# Patient Record
Sex: Female | Born: 1950 | Race: White | Hispanic: No | Marital: Married | Smoking: Never smoker
Health system: Southern US, Community
[De-identification: ages and names within clinical notes are randomized; demographics above are authoritative.]

## PROBLEM LIST (undated history)

## (undated) DIAGNOSIS — H269 Unspecified cataract: Secondary | ICD-10-CM

## (undated) DIAGNOSIS — K219 Gastro-esophageal reflux disease without esophagitis: Secondary | ICD-10-CM

## (undated) DIAGNOSIS — T7840XA Allergy, unspecified, initial encounter: Secondary | ICD-10-CM

## (undated) DIAGNOSIS — J45909 Unspecified asthma, uncomplicated: Secondary | ICD-10-CM

## (undated) DIAGNOSIS — B029 Zoster without complications: Secondary | ICD-10-CM

## (undated) DIAGNOSIS — E039 Hypothyroidism, unspecified: Secondary | ICD-10-CM

## (undated) DIAGNOSIS — IMO0002 Reserved for concepts with insufficient information to code with codable children: Secondary | ICD-10-CM

## (undated) DIAGNOSIS — M1990 Unspecified osteoarthritis, unspecified site: Secondary | ICD-10-CM

## (undated) DIAGNOSIS — F411 Generalized anxiety disorder: Secondary | ICD-10-CM

## (undated) DIAGNOSIS — F519 Sleep disorder not due to a substance or known physiological condition, unspecified: Secondary | ICD-10-CM

## (undated) DIAGNOSIS — J309 Allergic rhinitis, unspecified: Secondary | ICD-10-CM

## (undated) DIAGNOSIS — B009 Herpesviral infection, unspecified: Secondary | ICD-10-CM

## (undated) DIAGNOSIS — M8580 Other specified disorders of bone density and structure, unspecified site: Secondary | ICD-10-CM

## (undated) DIAGNOSIS — E079 Disorder of thyroid, unspecified: Secondary | ICD-10-CM

## (undated) HISTORY — DX: Generalized anxiety disorder: F41.1

## (undated) HISTORY — DX: Allergy, unspecified, initial encounter: T78.40XA

## (undated) HISTORY — DX: Zoster without complications: B02.9

## (undated) HISTORY — DX: Reserved for concepts with insufficient information to code with codable children: IMO0002

## (undated) HISTORY — DX: Gastro-esophageal reflux disease without esophagitis: K21.9

## (undated) HISTORY — DX: Herpesviral infection, unspecified: B00.9

## (undated) HISTORY — DX: Unspecified cataract: H26.9

## (undated) HISTORY — PX: APPENDECTOMY: SHX54

## (undated) HISTORY — DX: Sleep disorder not due to a substance or known physiological condition, unspecified: F51.9

## (undated) HISTORY — PX: FRACTURE SURGERY: SHX138

## (undated) HISTORY — PX: COSMETIC SURGERY: SHX468

## (undated) HISTORY — DX: Allergic rhinitis, unspecified: J30.9

## (undated) HISTORY — DX: Disorder of thyroid, unspecified: E07.9

## (undated) HISTORY — PX: EYE SURGERY: SHX253

## (undated) HISTORY — DX: Unspecified asthma, uncomplicated: J45.909

## (undated) HISTORY — PX: FACELIFT: SHX1566

## (undated) HISTORY — PX: JOINT REPLACEMENT: SHX530

## (undated) HISTORY — DX: Other specified disorders of bone density and structure, unspecified site: M85.80

## (undated) HISTORY — PX: OTHER SURGICAL HISTORY: SHX169

## (undated) HISTORY — PX: BREAST SURGERY: SHX581

---

## 1972-01-07 HISTORY — PX: TONSILLECTOMY: SUR1361

## 1989-01-06 HISTORY — PX: VAGINAL HYSTERECTOMY: SUR661

## 1998-04-12 ENCOUNTER — Other Ambulatory Visit: Admission: RE | Admit: 1998-04-12 | Discharge: 1998-04-12 | Payer: Self-pay | Admitting: Gynecology

## 1998-06-21 ENCOUNTER — Ambulatory Visit (HOSPITAL_COMMUNITY): Admission: RE | Admit: 1998-06-21 | Discharge: 1998-06-21 | Payer: Self-pay | Admitting: Internal Medicine

## 1998-06-21 ENCOUNTER — Encounter: Payer: Self-pay | Admitting: Internal Medicine

## 1999-06-14 ENCOUNTER — Other Ambulatory Visit: Admission: RE | Admit: 1999-06-14 | Discharge: 1999-06-14 | Payer: Self-pay | Admitting: Gynecology

## 1999-09-04 ENCOUNTER — Ambulatory Visit (HOSPITAL_COMMUNITY): Admission: RE | Admit: 1999-09-04 | Discharge: 1999-09-04 | Payer: Self-pay | Admitting: Gastroenterology

## 1999-10-21 ENCOUNTER — Other Ambulatory Visit: Admission: RE | Admit: 1999-10-21 | Discharge: 1999-10-21 | Payer: Self-pay | Admitting: Gynecology

## 2001-11-03 ENCOUNTER — Other Ambulatory Visit: Admission: RE | Admit: 2001-11-03 | Discharge: 2001-11-03 | Payer: Self-pay | Admitting: Gynecology

## 2002-10-26 ENCOUNTER — Other Ambulatory Visit: Admission: RE | Admit: 2002-10-26 | Discharge: 2002-10-26 | Payer: Self-pay | Admitting: Gynecology

## 2003-11-02 ENCOUNTER — Other Ambulatory Visit: Admission: RE | Admit: 2003-11-02 | Discharge: 2003-11-02 | Payer: Self-pay | Admitting: Gynecology

## 2003-12-27 ENCOUNTER — Ambulatory Visit: Payer: Self-pay | Admitting: Internal Medicine

## 2004-06-25 ENCOUNTER — Ambulatory Visit: Payer: Self-pay | Admitting: Internal Medicine

## 2004-07-03 ENCOUNTER — Ambulatory Visit: Payer: Self-pay | Admitting: Internal Medicine

## 2004-11-08 ENCOUNTER — Other Ambulatory Visit: Admission: RE | Admit: 2004-11-08 | Discharge: 2004-11-08 | Payer: Self-pay | Admitting: Gynecology

## 2005-01-30 ENCOUNTER — Emergency Department (HOSPITAL_COMMUNITY): Admission: EM | Admit: 2005-01-30 | Discharge: 2005-01-30 | Payer: Self-pay | Admitting: Emergency Medicine

## 2005-02-05 ENCOUNTER — Ambulatory Visit: Payer: Self-pay | Admitting: Internal Medicine

## 2005-02-06 ENCOUNTER — Ambulatory Visit: Payer: Self-pay | Admitting: Cardiology

## 2005-02-06 IMAGING — CT CT HEAD W/O CM
3 of 6 series · 15 of 47 positions shown, 18 images · IV contrast (omnipaque)
Comparison: Report of [DATE].

CLINICAL DATA: Three episodes of syncope.  Shortness of breath and chest pain in the last week.
HEAD CT WITHOUT CONTRAST:
TECHNIQUE: Contiguous axial images were obtained from the base of the skull through the vertex, according to standard protocol, without contrast.
TECHNIQUE: Multidetector CT imaging of the chest was performed during bolus injection of intravenous contrast.  Multiplanar CT angiographic image reconstructions were generated to evaluate the vascular anatomy.
Contrast:  80 cc Omnipaque 300.

[Series 8: pe 1.0 b20f st · axial · 0.60mm/px · z∈[+1058,+1286]mm · 9 of 262 slices shown, 12 images]
[im 17/262  brain]
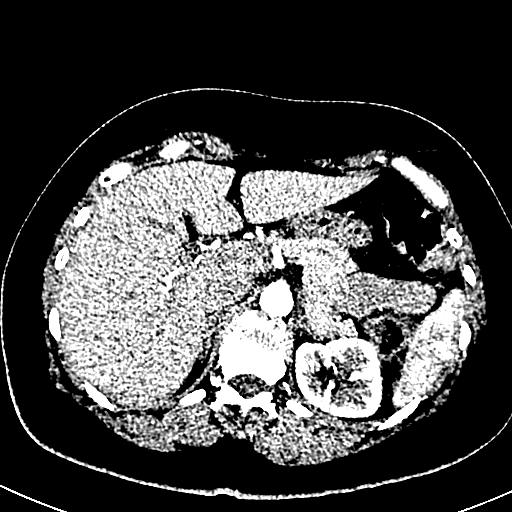
[im 17/262  bone]
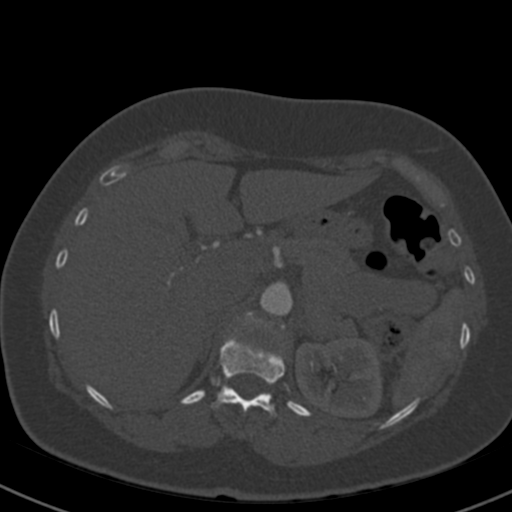
[im 49/262  brain]
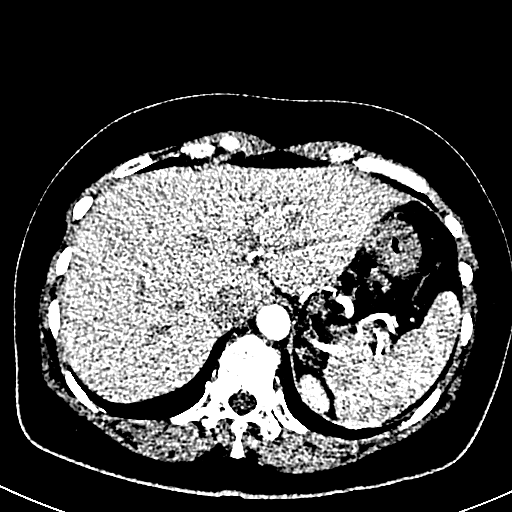
[im 82/262  brain]
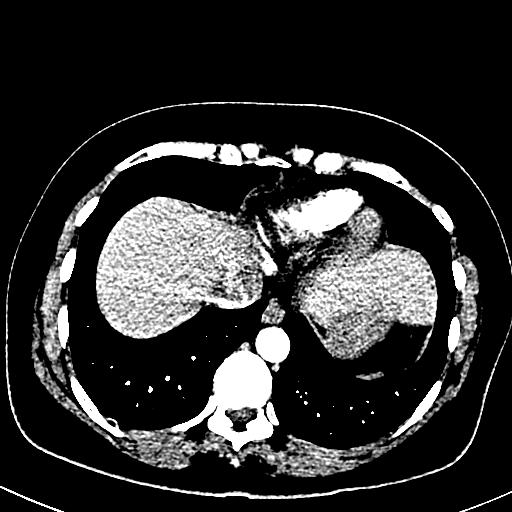
[im 98/262  brain]
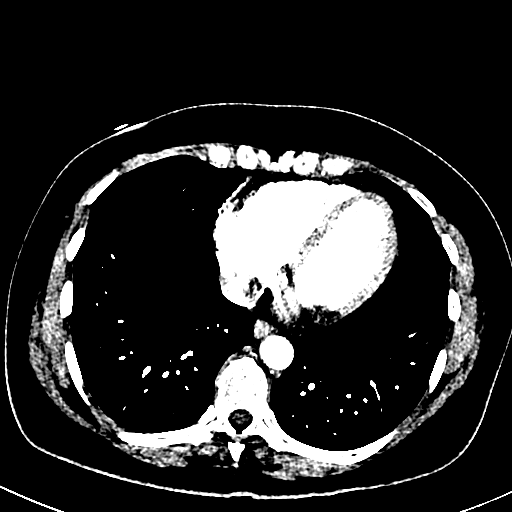
[im 131/262  brain]
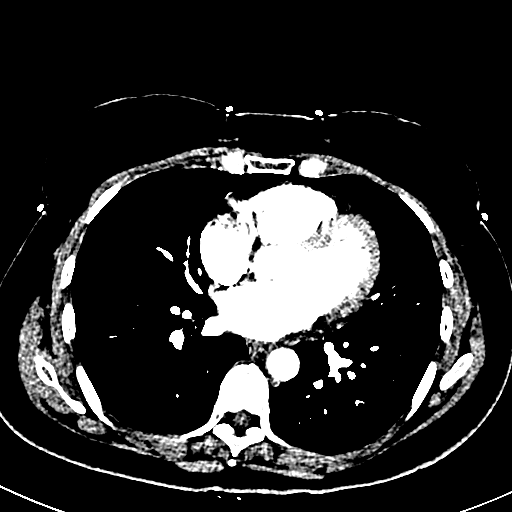
[im 131/262  bone]
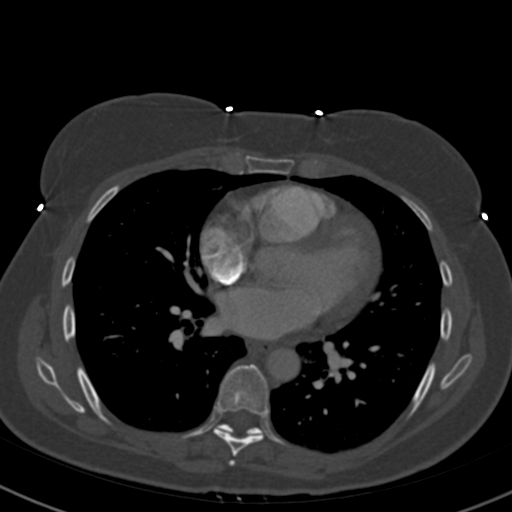
[im 164/262  brain]
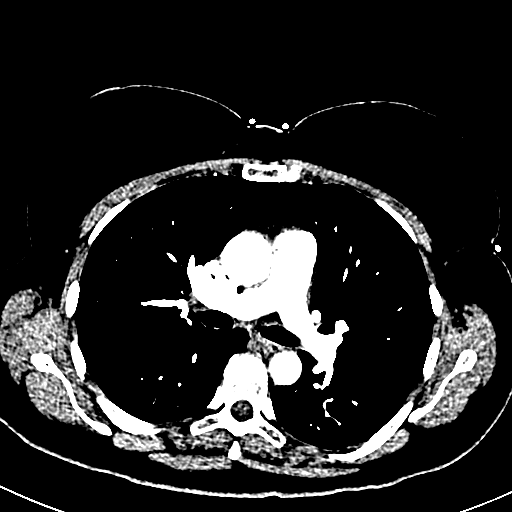
[im 180/262  brain]
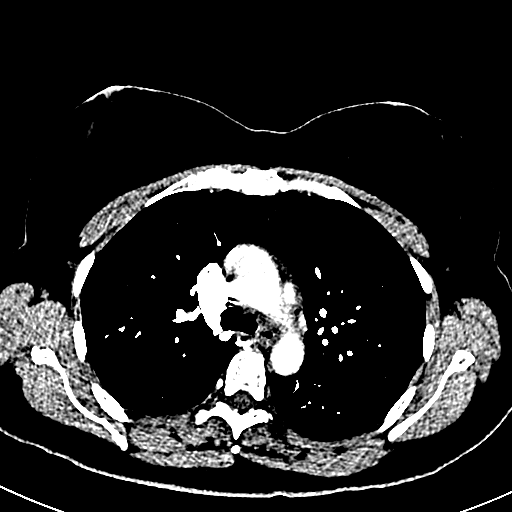
[im 213/262  brain]
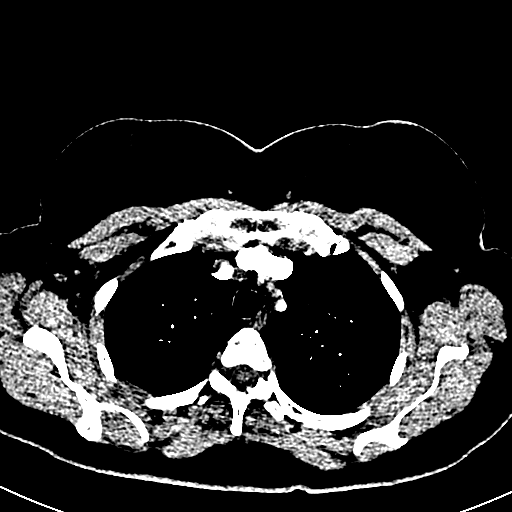
[im 245/262  brain]
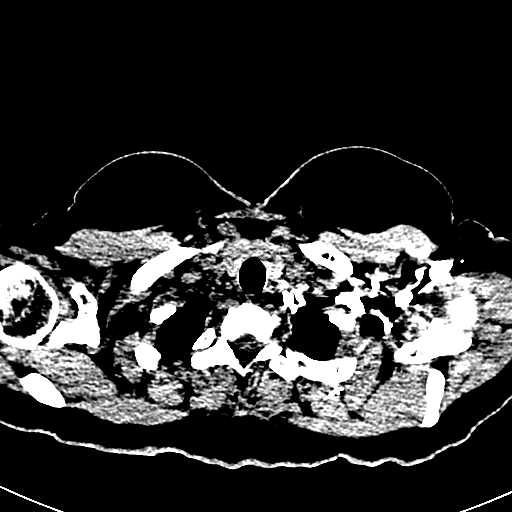
[im 245/262  bone]
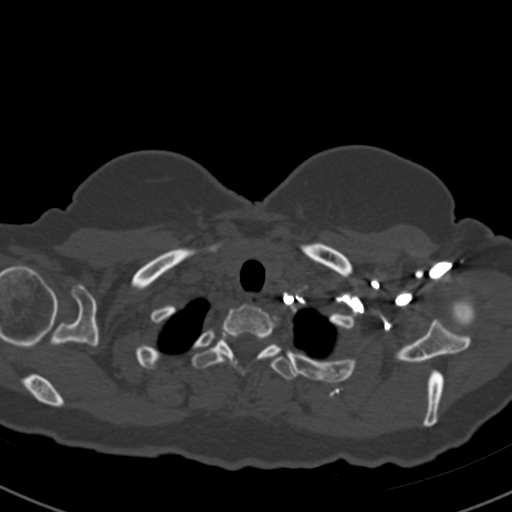

[Series 603: <mpr thick range> · coronal · 0.60mm/px · 3 of 39 slices shown]
[im 13/39  brain]
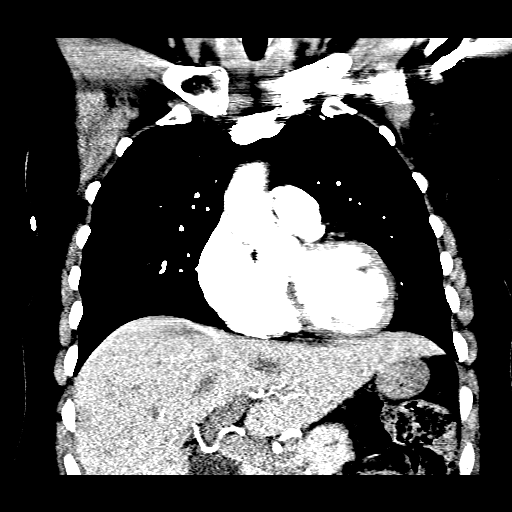
[im 17/39  brain]
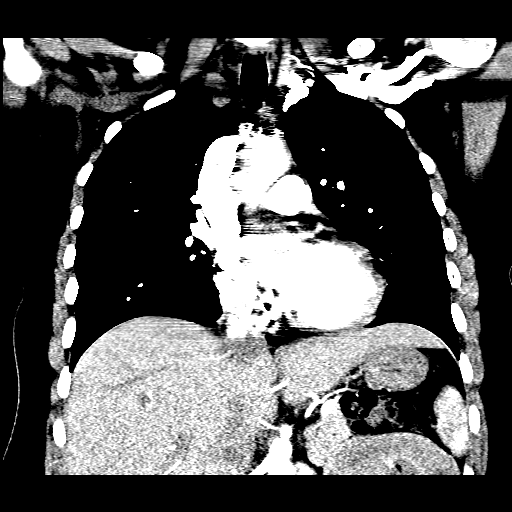
[im 22/39  brain]
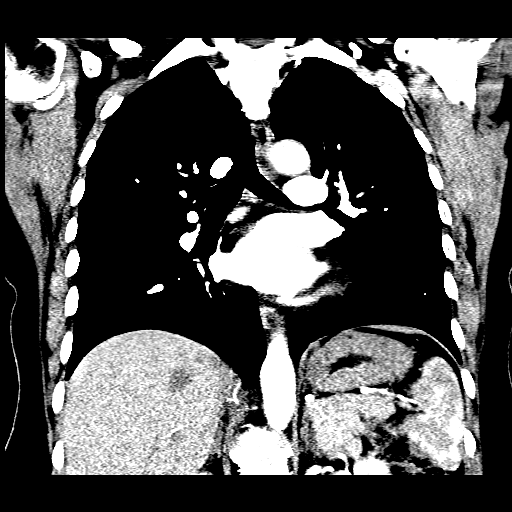

[Series 604: <mpr thick range(1)> · sagittal · 0.60mm/px · 3 of 39 slices shown]
[im 13/39  brain]
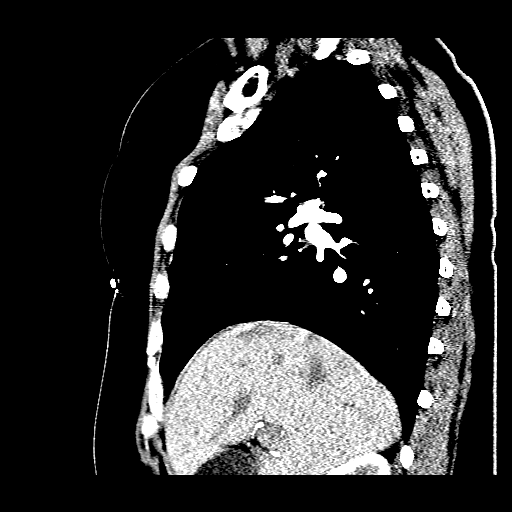
[im 20/39  brain]
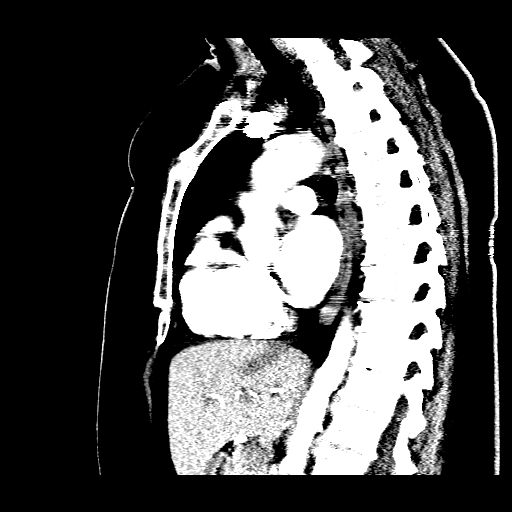
[im 26/39  brain]
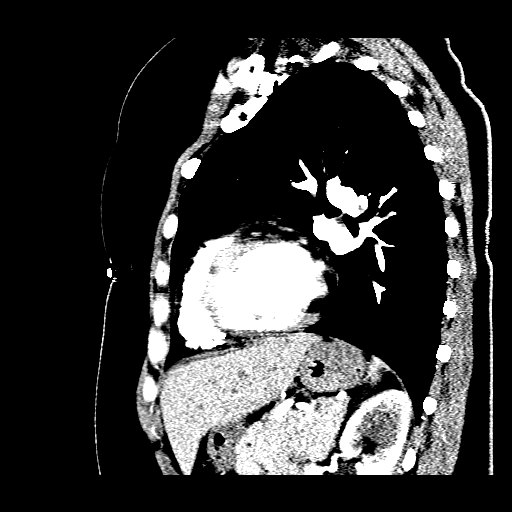

[15 of 47 positions shown; findings below may reference images not displayed]

FINDINGS: Soft tissue windows demonstrate no significant soft tissue swelling.  The paranasal sinuses and orbits are normal.  
Intracranial imaging demonstrates no acute intracranial abnormality.  No mass, hemorrhage, hydrocephalus, intra-axial, or extra-axial fluid collection.  Tiny low density focus in the left external capsule region on image #14 could represent remote ischemia or more likely, a dilated perivascular space.  There are physiologic basal ganglia calcifications.
IMPRESSION: No acute intracranial abnormality.  
CT ANGIOGRAPHY OF CHEST:
FINDINGS: Lung windows demonstrate an 8 mm focus of ground-glass attenuation on image #45 in the anterior aspect of the right lower lobe.  Left lung is clear.  
Soft tissue windows:  The quality of the study is good.  There is no filling defect within the pulmonary arterial tree to suggest pulmonary embolism.  Thoracic aorta is normal caliber.  Heart mildly enlarged.  No pericardial or pleural effusion.  Top normal right hilar nodal tissue size, likely reactive.  Limited imaging of upper abdomen demonstrates only accessory left hepatic artery arising from the left gastric.  
Bone windows demonstrate no focal osseous lesion.
IMPRESSION: 1.  No evidence of pulmonary embolism.  
2.  8 mm ground-glass attenuation density in the anterior aspect of the right lower lobe (image #45).  Although this could be related to prior infection or inflammation, bronchoalveolar cell carcinoma could have this appearance.  This could be followed with chest CT in 3 months to confirm stability or resolution.  Alternatively, a PET could be performed.  Of note, this area is at the low end of resolution for PET and some bronchoalveolar cell carcinomas can be PET negative.  Therefore, PET would be insensitive but specific.

## 2005-03-11 ENCOUNTER — Ambulatory Visit: Payer: Self-pay | Admitting: Cardiology

## 2005-03-25 ENCOUNTER — Ambulatory Visit: Payer: Self-pay

## 2005-03-25 ENCOUNTER — Encounter: Payer: Self-pay | Admitting: Internal Medicine

## 2005-05-07 ENCOUNTER — Ambulatory Visit: Payer: Self-pay | Admitting: Cardiology

## 2005-05-07 IMAGING — CT CT CHEST W/O CM
3 of 5 series · 16 of 36 positions shown, 19 images · non-contrast
Comparison: [DATE].
CHEST CT WITHOUT CONTRAST:

CLINICAL DATA: Follow-up nodules.
TECHNIQUE: Multidetector CT imaging of the chest was performed following the standard protocol without IV contrast.

[Series 2: chest_hi_res 5.0 b40f st · axial · 0.62mm/px · z∈[-274,-74]mm · 9 of 51 slices shown, 12 images]
[im 6/51  mediastinal]
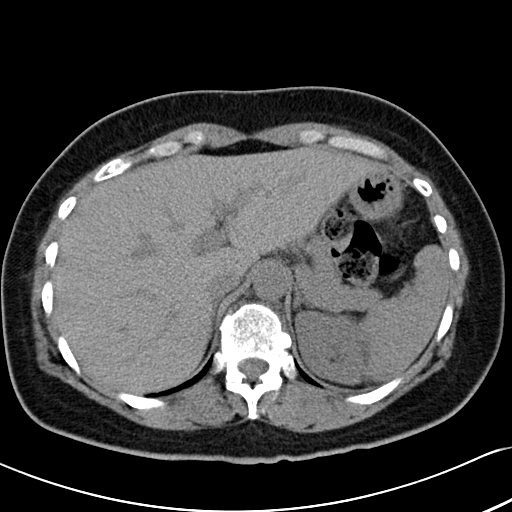
[im 6/51  lung]
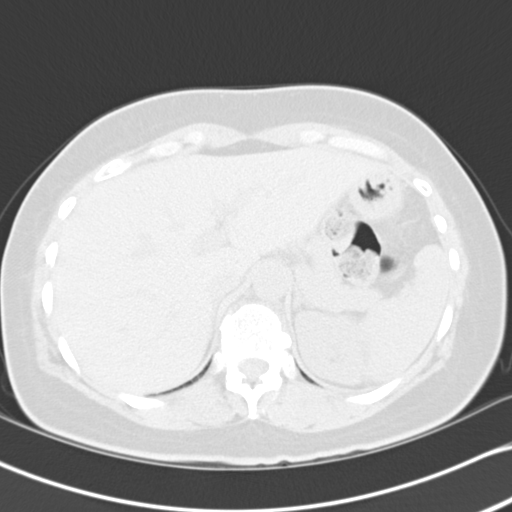
[im 11/51  lung]
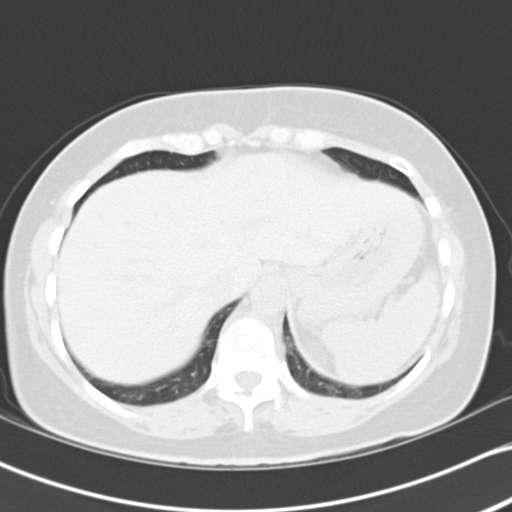
[im 16/51  lung]
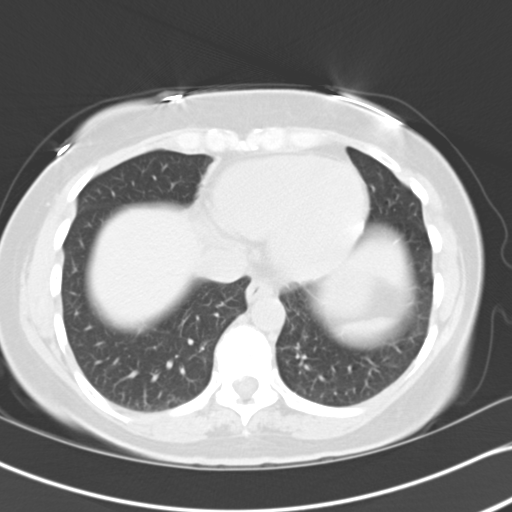
[im 21/51  lung]
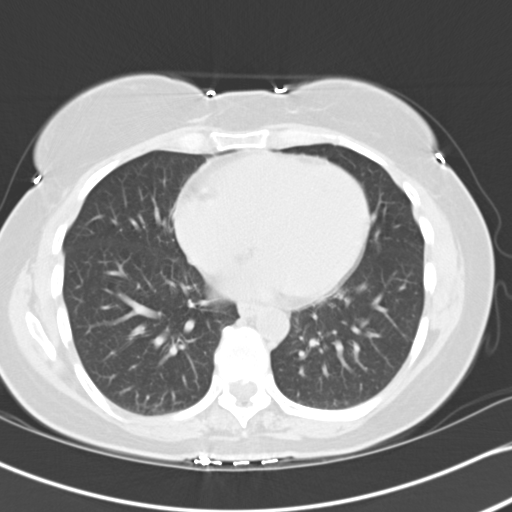
[im 26/51  mediastinal]
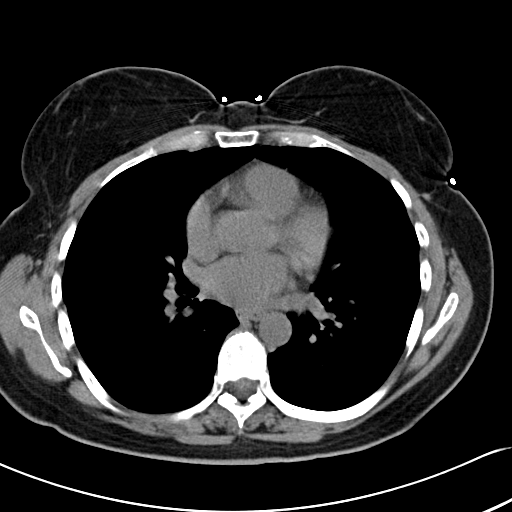
[im 26/51  lung]
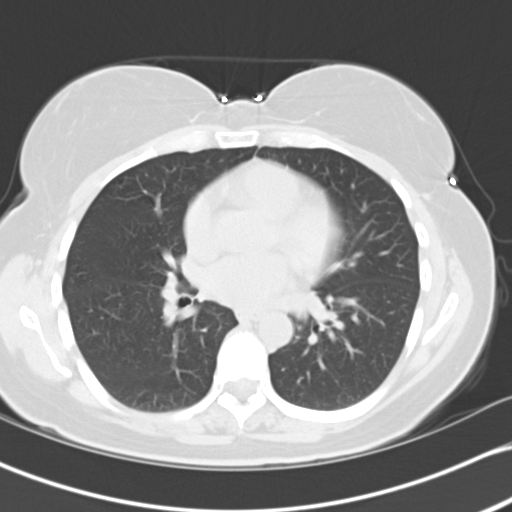
[im 31/51  lung]
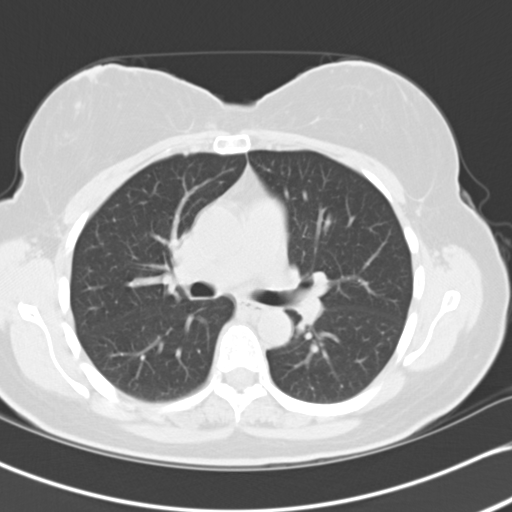
[im 36/51  lung]
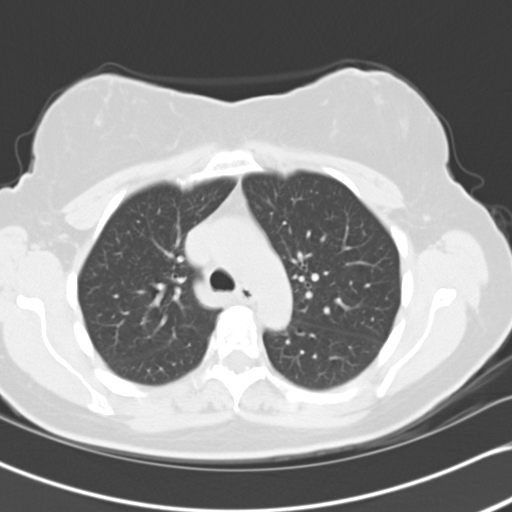
[im 41/51  lung]
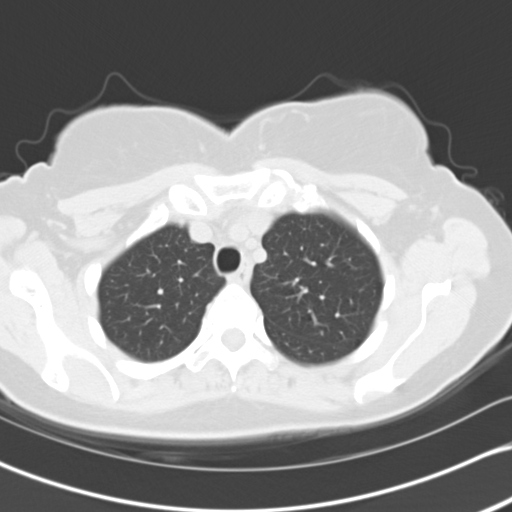
[im 46/51  mediastinal]
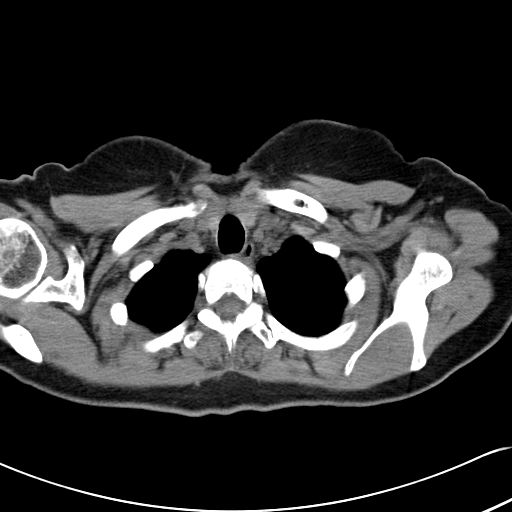
[im 46/51  lung]
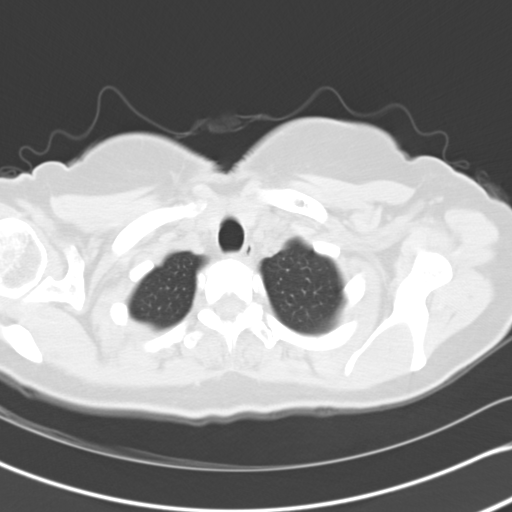

[Series 4: chest_hi_(id) b80f high res lung · axial · 0.62mm/px · z∈[-244,-94]mm · 4 of 26 slices shown]
[im 6/26  lung]
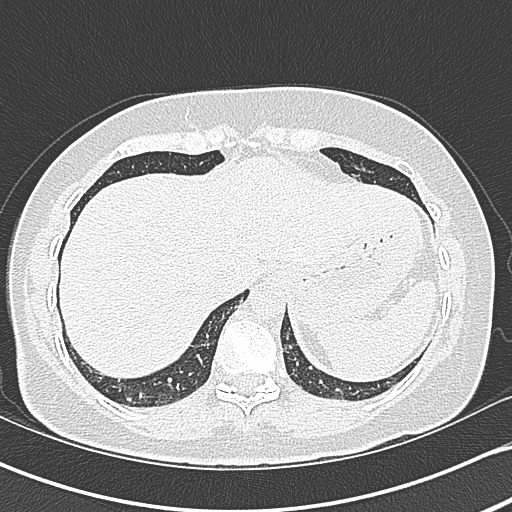
[im 11/26  lung]
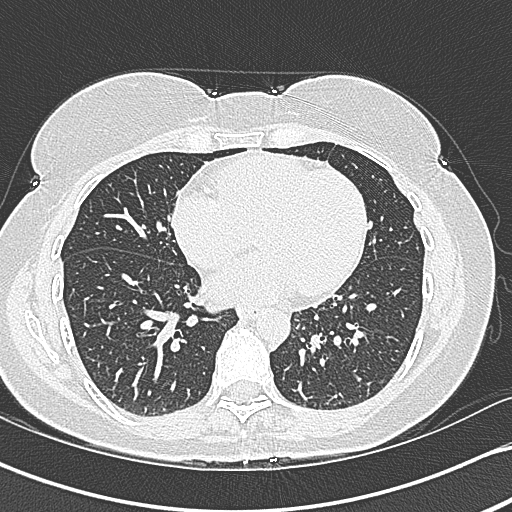
[im 16/26  lung]
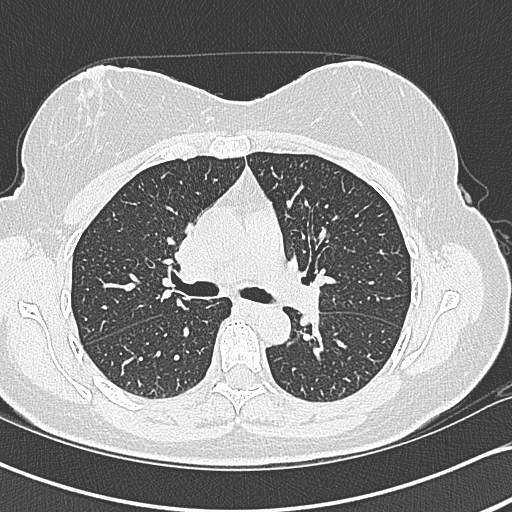
[im 21/26  lung]
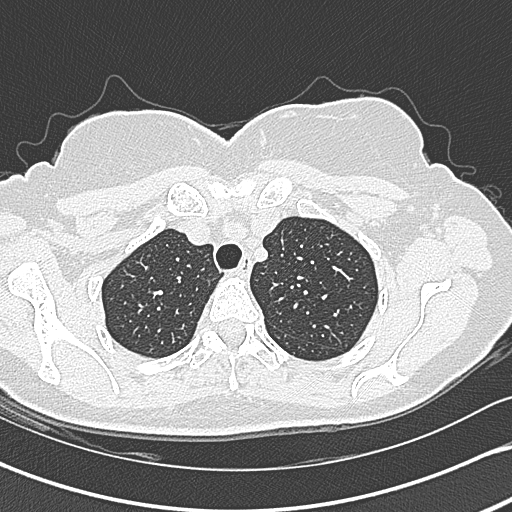

[Series 602: <mpr range> · coronal · 0.62mm/px · 3 of 39 slices shown]
[im 8/39  lung]
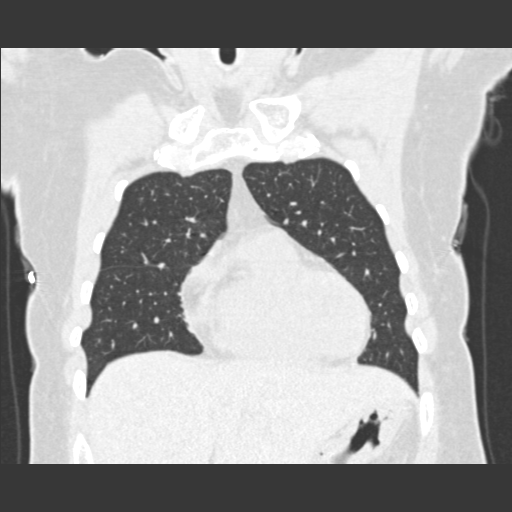
[im 16/39  lung]
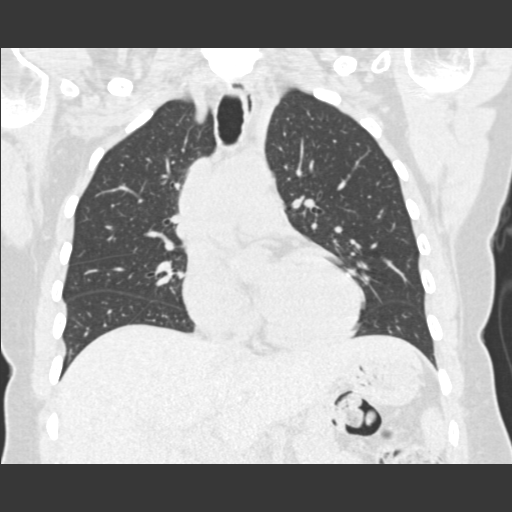
[im 23/39  lung]
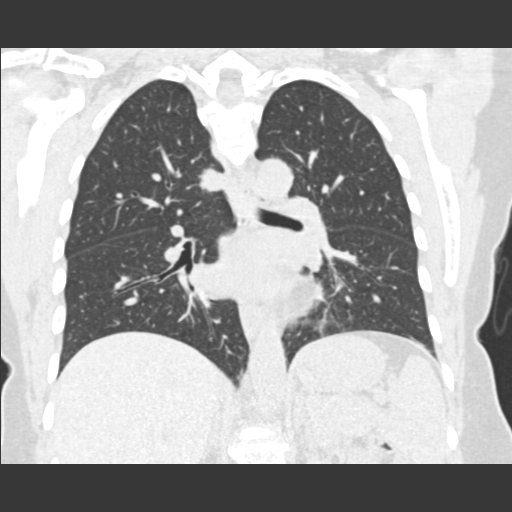

[16 of 36 positions shown; findings below may reference images not displayed]

FINDINGS: Small nodules are seen in the thyroid.  No pathologically enlarged mediastinal, hilar or axillary lymph nodes.  Heart size normal.  No pericardial fluid.  Small hiatal hernia.  8 mm ground-glass nodule in the right lower lobe, adjacent to the major fissure, is unchanged in a 3-month interval.  No new pulmonary nodules.  No pleural fluid.  Airway is unremarkable.
IMPRESSION: 8 mm right lower lobe ground-glass nodule is somewhat nonspecific in appearance and stable in a 3-month interval.  Follow-up in 6 months is recommended to ensure stability.

## 2005-05-26 ENCOUNTER — Ambulatory Visit: Payer: Self-pay | Admitting: Internal Medicine

## 2005-05-27 ENCOUNTER — Ambulatory Visit: Payer: Self-pay | Admitting: Internal Medicine

## 2005-08-29 ENCOUNTER — Ambulatory Visit: Payer: Self-pay | Admitting: Internal Medicine

## 2005-11-12 ENCOUNTER — Other Ambulatory Visit: Admission: RE | Admit: 2005-11-12 | Discharge: 2005-11-12 | Payer: Self-pay | Admitting: Gynecology

## 2006-03-03 ENCOUNTER — Ambulatory Visit: Payer: Self-pay | Admitting: Internal Medicine

## 2006-05-20 ENCOUNTER — Ambulatory Visit: Payer: Self-pay | Admitting: Internal Medicine

## 2006-05-20 LAB — CONVERTED CEMR LAB
Bilirubin Urine: NEGATIVE
Hemoglobin, Urine: NEGATIVE
Ketones, ur: NEGATIVE mg/dL
Leukocytes, UA: NEGATIVE
Nitrite: NEGATIVE
Specific Gravity, Urine: 1.01 (ref 1.000–1.03)
Total Protein, Urine: NEGATIVE mg/dL
Urine Glucose: NEGATIVE mg/dL
Urobilinogen, UA: 0.2 (ref 0.0–1.0)
pH: 7.5 (ref 5.0–8.0)

## 2006-06-05 ENCOUNTER — Ambulatory Visit (HOSPITAL_COMMUNITY): Admission: EM | Admit: 2006-06-05 | Discharge: 2006-06-06 | Payer: Self-pay | Admitting: Emergency Medicine

## 2006-06-05 ENCOUNTER — Encounter (INDEPENDENT_AMBULATORY_CARE_PROVIDER_SITE_OTHER): Payer: Self-pay | Admitting: General Surgery

## 2006-06-05 ENCOUNTER — Ambulatory Visit: Payer: Self-pay | Admitting: Internal Medicine

## 2006-06-05 LAB — CONVERTED CEMR LAB
BUN: 7 mg/dL (ref 6–23)
Bilirubin Urine: NEGATIVE
CO2: 29 meq/L (ref 19–32)
Calcium: 8.9 mg/dL (ref 8.4–10.5)
Chloride: 107 meq/L (ref 96–112)
Creatinine, Ser: 0.6 mg/dL (ref 0.4–1.2)
Crystals: NEGATIVE
GFR calc Af Amer: 133 mL/min
GFR calc non Af Amer: 110 mL/min
Glucose, Bld: 123 mg/dL — ABNORMAL HIGH (ref 70–99)
Hemoglobin, Urine: NEGATIVE
Ketones, ur: >=80 mg/dL — AB
Leukocytes, UA: NEGATIVE
Nitrite: NEGATIVE
Potassium: 3.9 meq/L (ref 3.5–5.1)
Sed Rate: 16 mm/hr (ref 0–25)
Sodium: 141 meq/L (ref 135–145)
Specific Gravity, Urine: 1.01 (ref 1.000–1.03)
Urine Glucose: NEGATIVE mg/dL
Urobilinogen, UA: 0.2 (ref 0.0–1.0)
pH: 8 (ref 5.0–8.0)

## 2006-06-05 IMAGING — CT CT PELVIS W/ CM
2 of 5 series · 13 of 32 positions shown, 18 images · IV contrast ([ID] GASTRO-MX & [ID] OMNIP 300%)
Comparison: none

CLINICAL DATA: Abdomen and right flank pain getting progressively worse and radiating into the right lower quadrant in the last 24 hours.  Hysterectomy 20 years ago. 
 ABDOMEN CT WITH CONTRAST ? [DATE]:
TECHNIQUE: Multidetector CT imaging of the abdomen was performed following the standard protocol during bolus administration of intravenous contrast.
 Contrast:  100 cc Omnipaque 300.
TECHNIQUE: Multidetector CT imaging of the pelvis was performed following the standard protocol during bolus administration of intravenous contrast.

[Series 2: abd pelvis · axial · 0.70mm/px · z∈[-404,-144]mm · 5 of 79 slices shown, 10 images]
[im 14/79  soft-tissue]
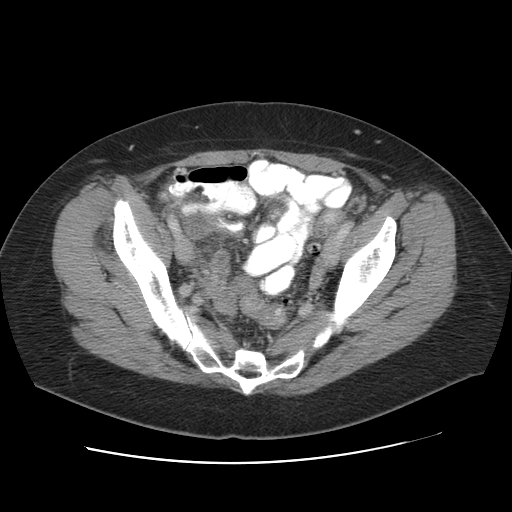
[im 14/79  bone]
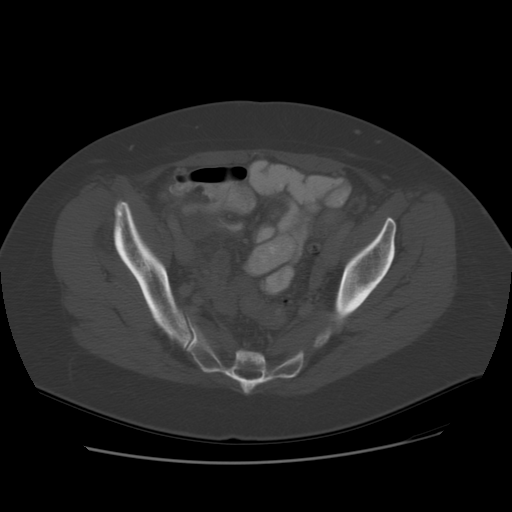
[im 27/79  soft-tissue]
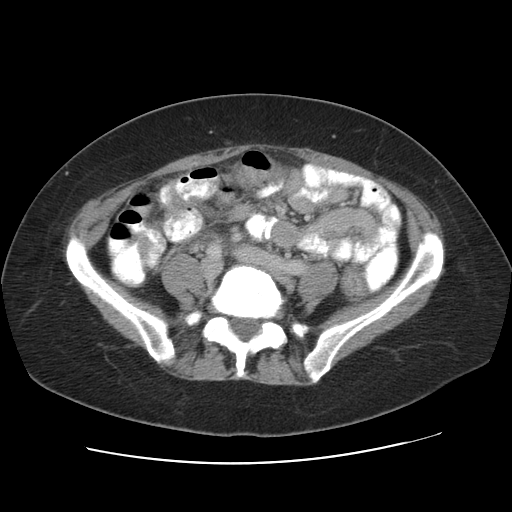
[im 27/79  lung]
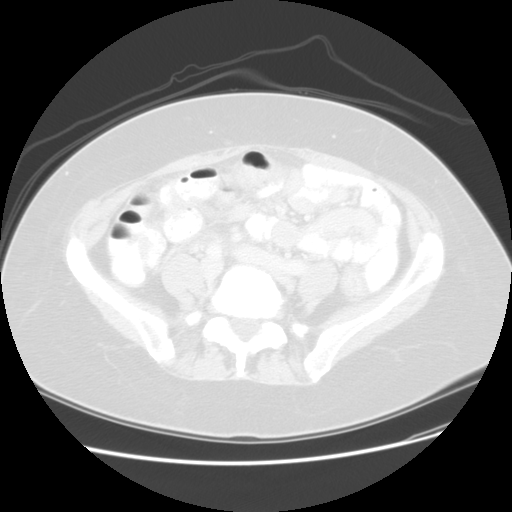
[im 40/79  soft-tissue]
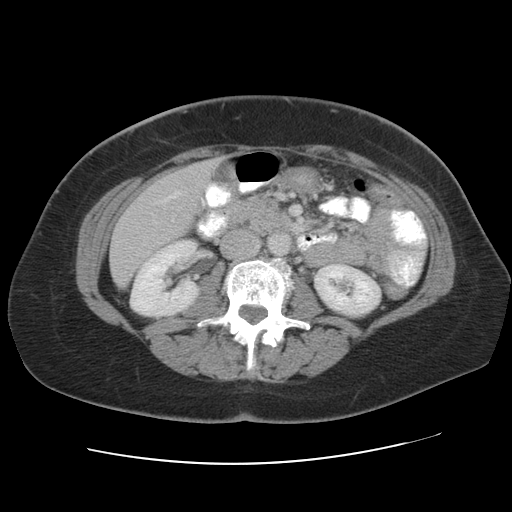
[im 40/79  lung]
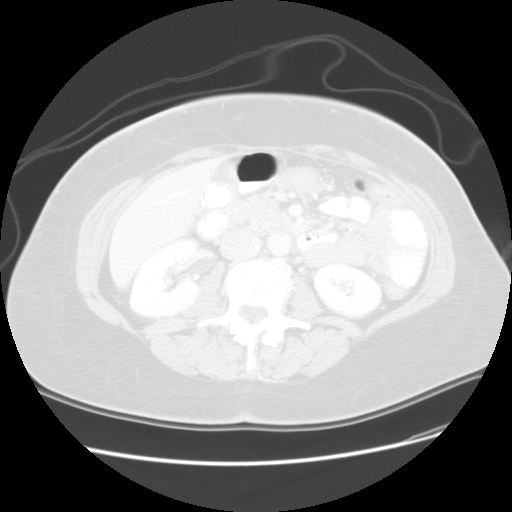
[im 53/79  soft-tissue]
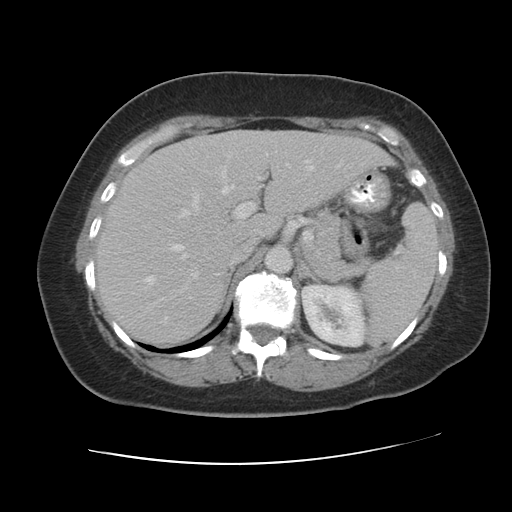
[im 53/79  lung]
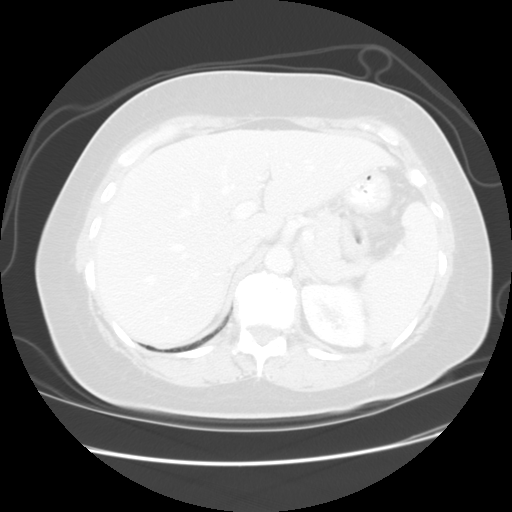
[im 66/79  soft-tissue]
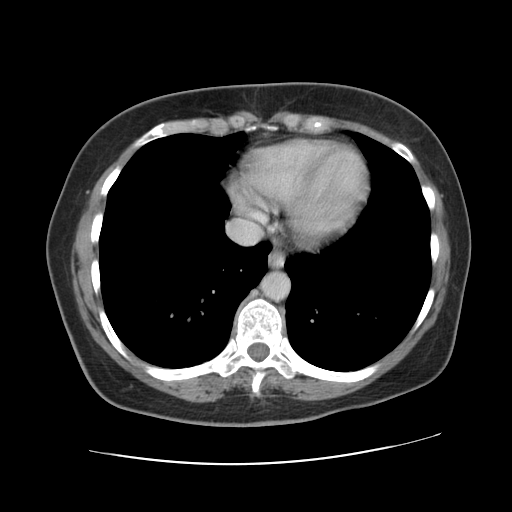
[im 66/79  lung]
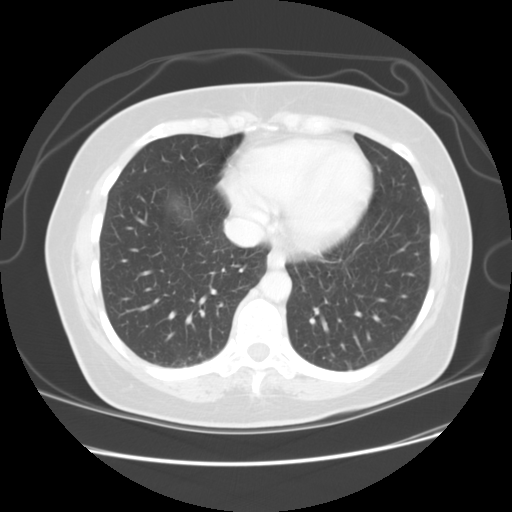

[Series 401: reformatted · sagittal · 0.78mm/px · 8 of 165 slices shown]
[im 15/165  soft-tissue]
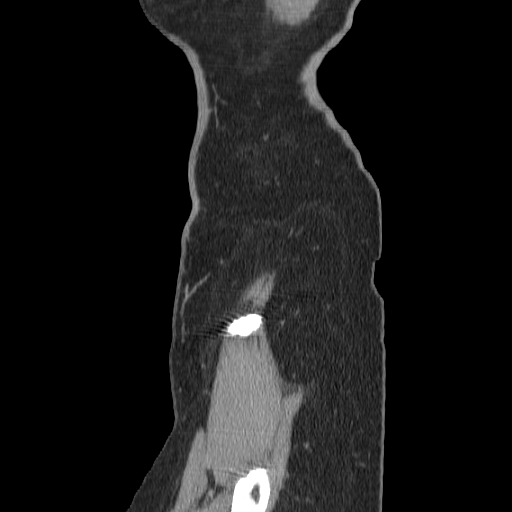
[im 30/165  soft-tissue]
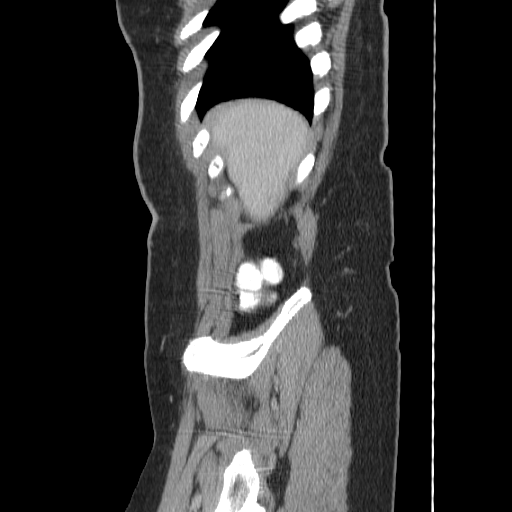
[im 60/165  soft-tissue]
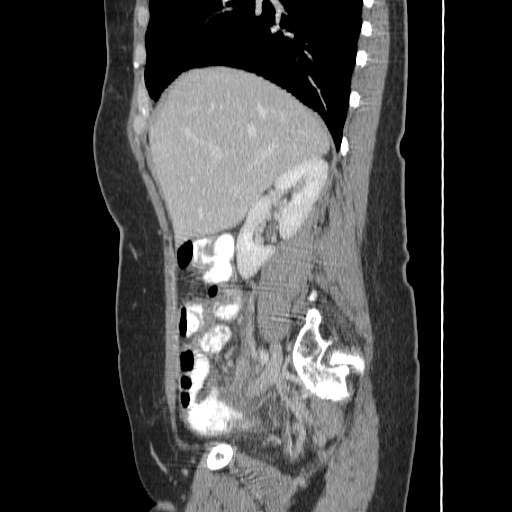
[im 75/165  soft-tissue]
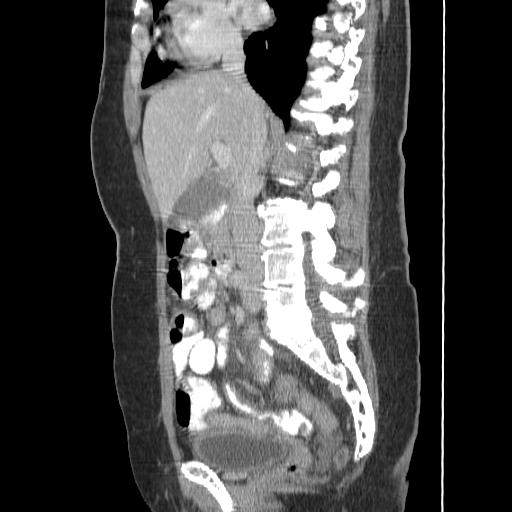
[im 90/165  soft-tissue]
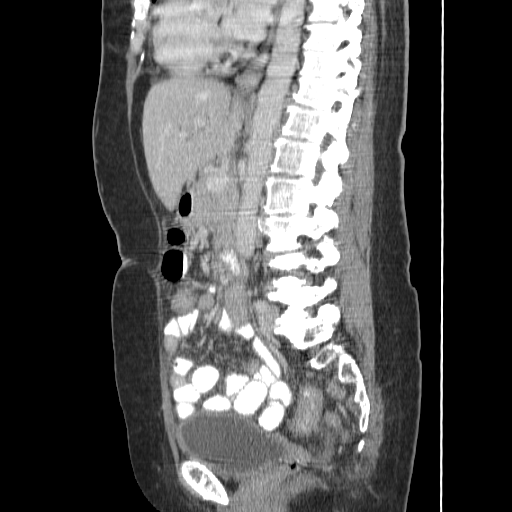
[im 105/165  soft-tissue]
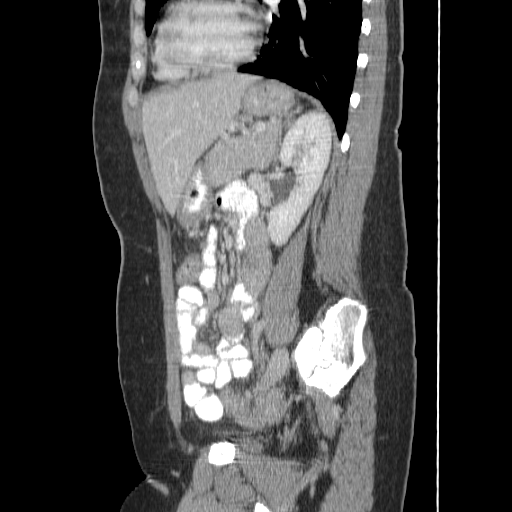
[im 135/165  soft-tissue]
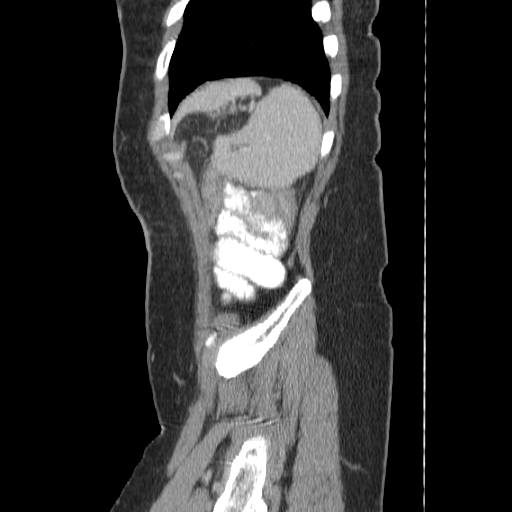
[im 150/165  soft-tissue]
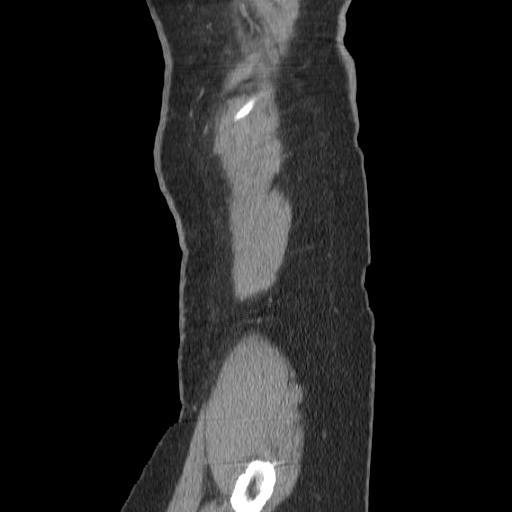

[13 of 32 positions shown; findings below may reference images not displayed]

FINDINGS: The liver, spleen, pancreas, adrenal glands, and kidneys demonstrate no significant abnormality.  There is a tiny cyst in the posterior aspect of the right kidney.  
 There is no free air or free fluid or bowel dilatation.
IMPRESSION: Benign appearing abdomen. 
 PELVIS CT WITH CONTRAST:
FINDINGS: The appendix is diffusely enlarged and inflamed with periappendiceal inflammation.  The appendix extends into the pelvis with the tip immediately adjacent to the right ovary.   There is no evidence of perforation or diverticular abscess.  There are several diverticula in the mid portion of the sigmoid portion of the colon.   There is a small amount of free fluid in the cul-de-sac.  The uterus has been removed.
IMPRESSION: Acute appendicitis with free fluid in the pelvis.  Sigmoid diverticulosis.  
 This report was called to Dr. KAREL and to Dr. KAREL by Dr. KAREL and the report was given directly to the patient by Dr. KAREL.

## 2006-06-10 ENCOUNTER — Ambulatory Visit: Payer: Self-pay | Admitting: Internal Medicine

## 2006-10-12 ENCOUNTER — Ambulatory Visit: Payer: Self-pay | Admitting: Internal Medicine

## 2006-10-12 LAB — CONVERTED CEMR LAB
ALT: 17 units/L (ref 0–35)
AST: 22 units/L (ref 0–37)
Albumin: 3.7 g/dL (ref 3.5–5.2)
Alkaline Phosphatase: 57 units/L (ref 39–117)
BUN: 11 mg/dL (ref 6–23)
Basophils Absolute: 0.1 10*3/uL (ref 0.0–0.1)
Basophils Relative: 1.2 % — ABNORMAL HIGH (ref 0.0–1.0)
Bilirubin Urine: NEGATIVE
Bilirubin, Direct: 0.1 mg/dL (ref 0.0–0.3)
CO2: 28 meq/L (ref 19–32)
Calcium: 8.9 mg/dL (ref 8.4–10.5)
Chloride: 109 meq/L (ref 96–112)
Cholesterol: 183 mg/dL (ref 0–200)
Creatinine, Ser: 0.6 mg/dL (ref 0.4–1.2)
Crystals: NEGATIVE
Eosinophils Absolute: 0.4 10*3/uL (ref 0.0–0.6)
Eosinophils Relative: 6.6 % — ABNORMAL HIGH (ref 0.0–5.0)
GFR calc Af Amer: 132 mL/min
GFR calc non Af Amer: 109 mL/min
Glucose, Bld: 91 mg/dL (ref 70–99)
HCT: 38.9 % (ref 36.0–46.0)
HDL: 64.4 mg/dL (ref >39.0)
Hemoglobin: 13.5 g/dL (ref 12.0–15.0)
Hgb A1c MFr Bld: 5.6 % (ref 4.6–6.0)
Ketones, ur: NEGATIVE mg/dL
LDL Cholesterol: 105 mg/dL — ABNORMAL HIGH (ref 0–99)
Lymphocytes Relative: 30.5 % (ref 12.0–46.0)
MCHC: 34.7 g/dL (ref 30.0–36.0)
MCV: 90.8 fL (ref 78.0–100.0)
Monocytes Absolute: 0.4 10*3/uL (ref 0.2–0.7)
Monocytes Relative: 8 % (ref 3.0–11.0)
Mucus, UA: NEGATIVE
Neutro Abs: 2.9 10*3/uL (ref 1.4–7.7)
Neutrophils Relative %: 53.7 % (ref 43.0–77.0)
Nitrite: NEGATIVE
Platelets: 210 10*3/uL (ref 150–400)
Potassium: 4 meq/L (ref 3.5–5.1)
RBC / HPF: NONE SEEN
RBC: 4.28 M/uL (ref 3.87–5.11)
RDW: 12 % (ref 11.5–14.6)
Sodium: 143 meq/L (ref 135–145)
Specific Gravity, Urine: 1.01 (ref 1.000–1.03)
TSH: 1.19 microintl units/mL (ref 0.35–5.50)
Total Bilirubin: 0.9 mg/dL (ref 0.3–1.2)
Total CHOL/HDL Ratio: 2.8
Total Protein, Urine: NEGATIVE mg/dL
Total Protein: 6.5 g/dL (ref 6.0–8.3)
Triglycerides: 66 mg/dL (ref 0–149)
Urine Glucose: NEGATIVE mg/dL
Urobilinogen, UA: 0.2 (ref 0.0–1.0)
VLDL: 13 mg/dL (ref 0–40)
WBC: 5.4 10*3/uL (ref 4.5–10.5)
pH: 8 (ref 5.0–8.0)

## 2006-10-16 ENCOUNTER — Encounter: Payer: Self-pay | Admitting: Internal Medicine

## 2006-10-16 ENCOUNTER — Ambulatory Visit: Payer: Self-pay | Admitting: Internal Medicine

## 2006-10-16 DIAGNOSIS — G4700 Insomnia, unspecified: Secondary | ICD-10-CM | POA: Insufficient documentation

## 2006-10-16 DIAGNOSIS — J309 Allergic rhinitis, unspecified: Secondary | ICD-10-CM | POA: Insufficient documentation

## 2006-10-16 DIAGNOSIS — F411 Generalized anxiety disorder: Secondary | ICD-10-CM | POA: Insufficient documentation

## 2006-10-16 DIAGNOSIS — F32A Depression, unspecified: Secondary | ICD-10-CM | POA: Insufficient documentation

## 2006-10-16 DIAGNOSIS — N3090 Cystitis, unspecified without hematuria: Secondary | ICD-10-CM | POA: Insufficient documentation

## 2006-10-16 DIAGNOSIS — N3080 Other cystitis without hematuria: Secondary | ICD-10-CM | POA: Insufficient documentation

## 2006-10-16 DIAGNOSIS — F329 Major depressive disorder, single episode, unspecified: Secondary | ICD-10-CM | POA: Insufficient documentation

## 2006-10-16 DIAGNOSIS — F519 Sleep disorder not due to a substance or known physiological condition, unspecified: Secondary | ICD-10-CM

## 2006-11-18 ENCOUNTER — Other Ambulatory Visit: Admission: RE | Admit: 2006-11-18 | Discharge: 2006-11-18 | Payer: Self-pay | Admitting: Gynecology

## 2007-02-09 ENCOUNTER — Telehealth: Payer: Self-pay | Admitting: Internal Medicine

## 2007-02-17 ENCOUNTER — Ambulatory Visit: Payer: Self-pay | Admitting: Internal Medicine

## 2007-02-24 ENCOUNTER — Telehealth: Payer: Self-pay | Admitting: Internal Medicine

## 2007-02-26 ENCOUNTER — Ambulatory Visit: Payer: Self-pay | Admitting: Cardiology

## 2007-02-26 ENCOUNTER — Telehealth: Payer: Self-pay | Admitting: Internal Medicine

## 2007-02-26 IMAGING — CT CT PARANASAL SINUSES LIMITED
1 of 2 series · 16 of 25 positions shown, 20 images · non-contrast
Comparison: none

CLINICAL DATA: Nasal congestion. Swelling under the left eye.
 LIMITED CT OF PARANASAL SINUSES:
TECHNIQUE: Limited coronal CT images were obtained through the paranasal sinuses without intravenous contrast.

[Series 4: ltd sinus 3.0 h30s · axial · 0.29mm/px · z∈[-116,-18]mm · 16 of 24 slices shown, 20 images]
[im 2/24  brain]
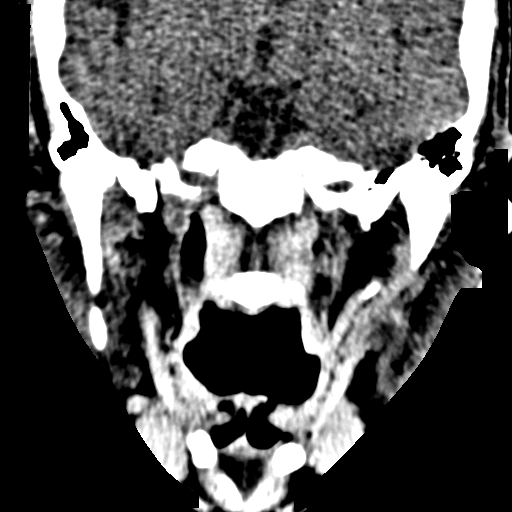
[im 2/24  bone]
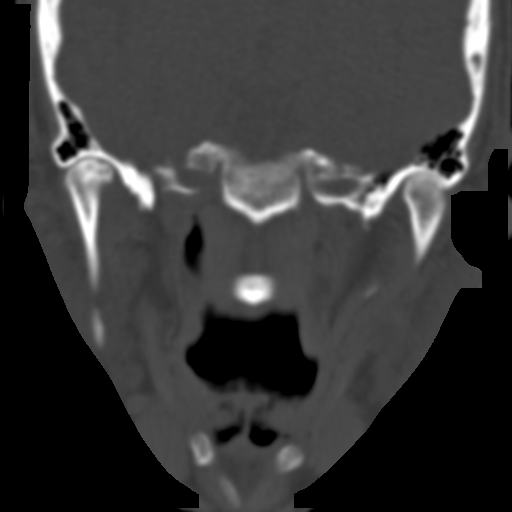
[im 4/24  bone]
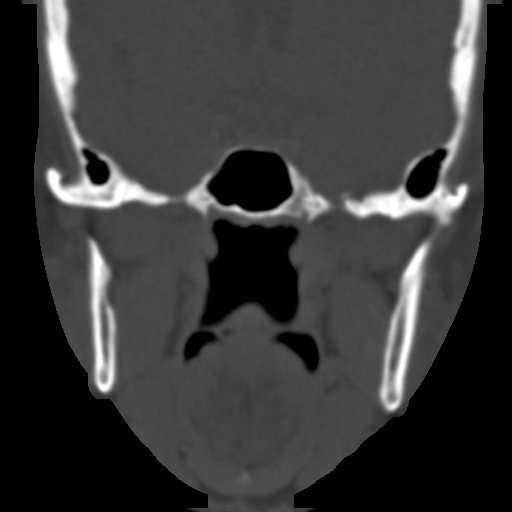
[im 5/24  bone]
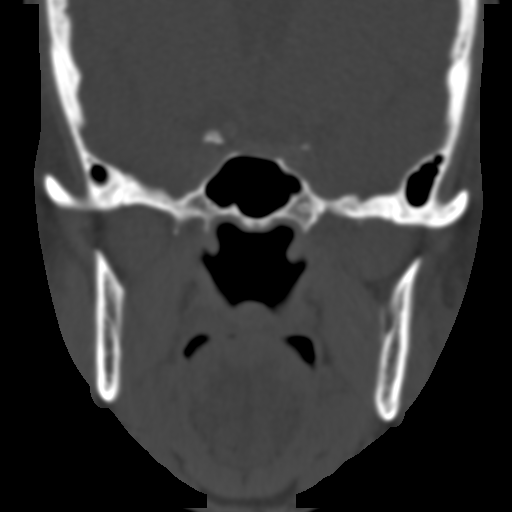
[im 6/24  bone]
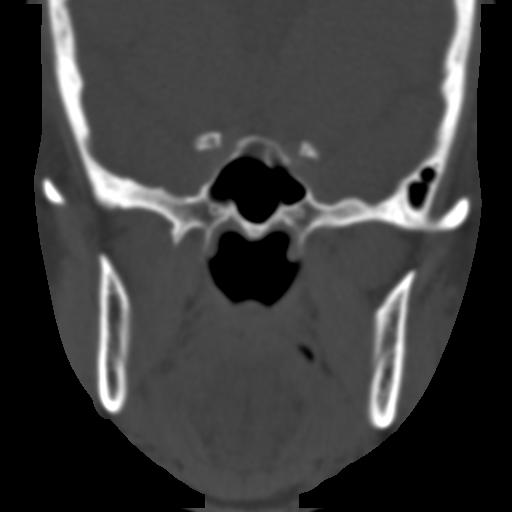
[im 8/24  brain]
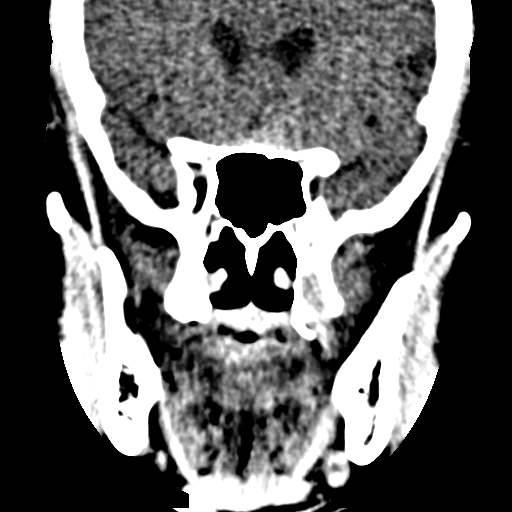
[im 8/24  bone]
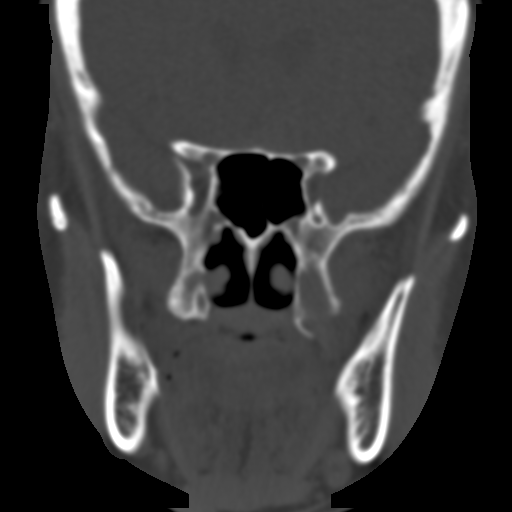
[im 9/24  bone]
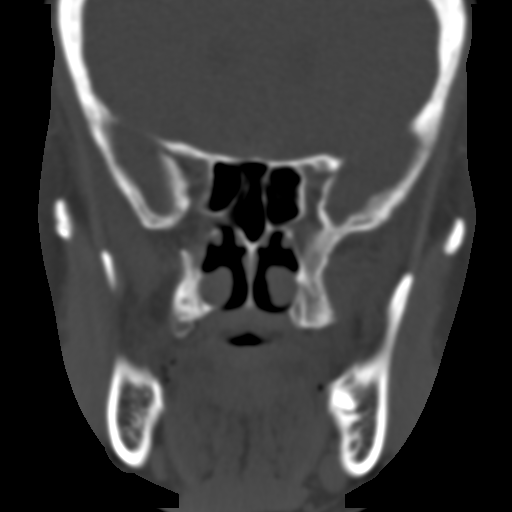
[im 10/24  bone]
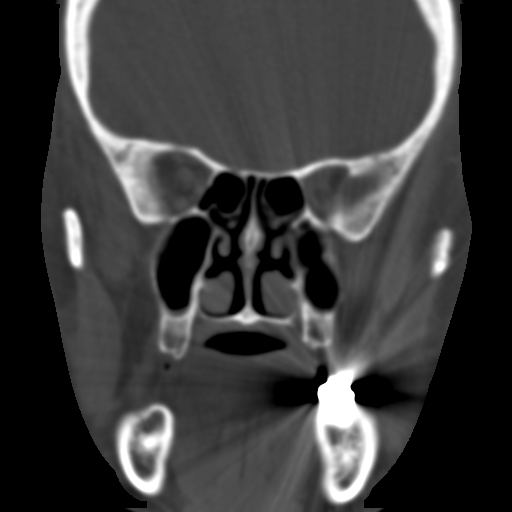
[im 12/24  bone]
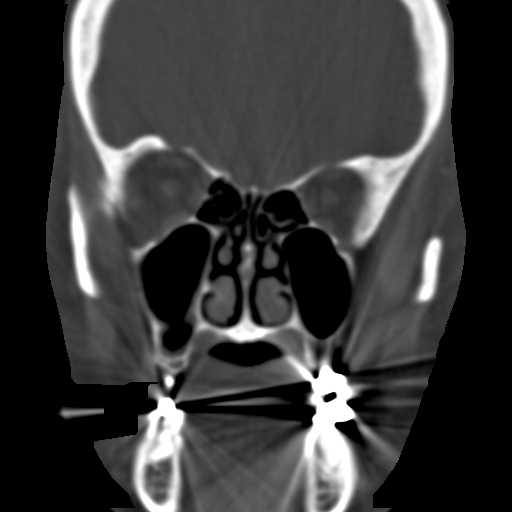
[im 13/24  brain]
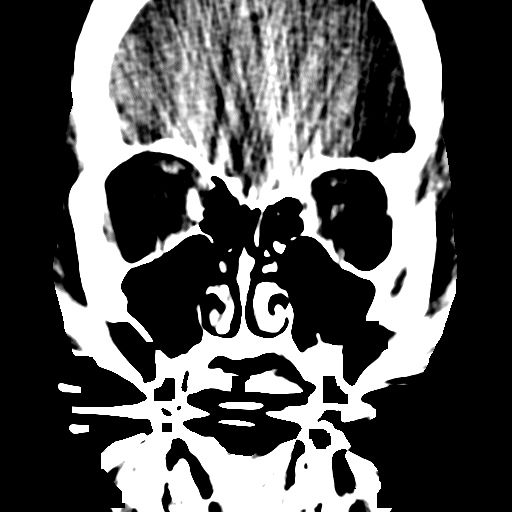
[im 13/24  bone]
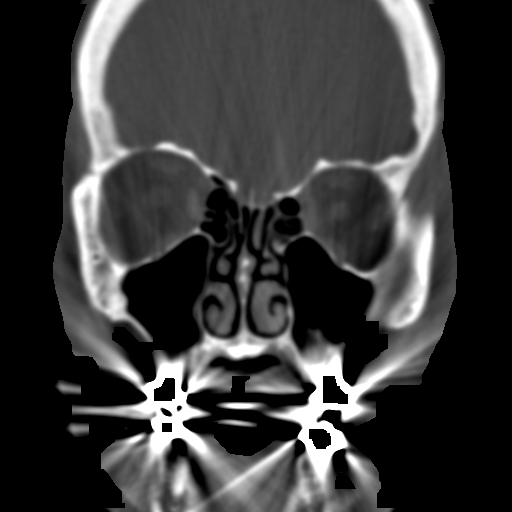
[im 15/24  bone]
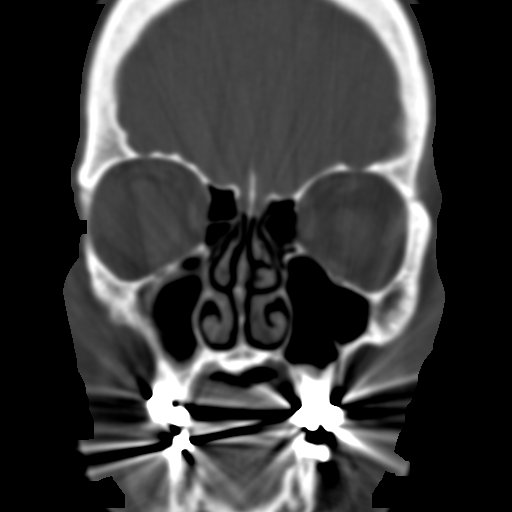
[im 16/24  bone]
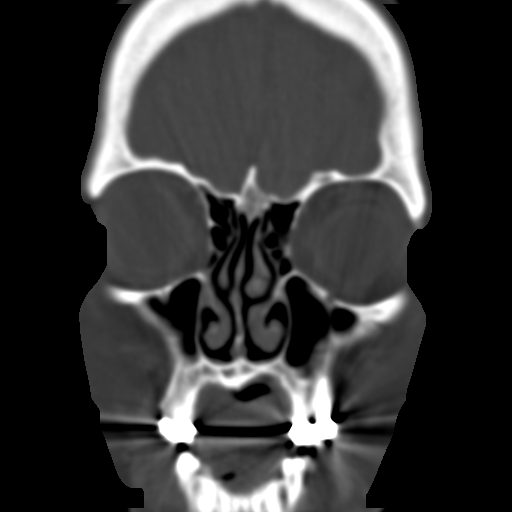
[im 17/24  bone]
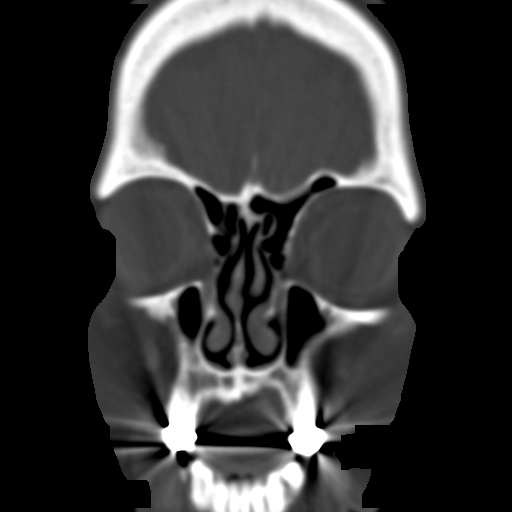
[im 19/24  brain]
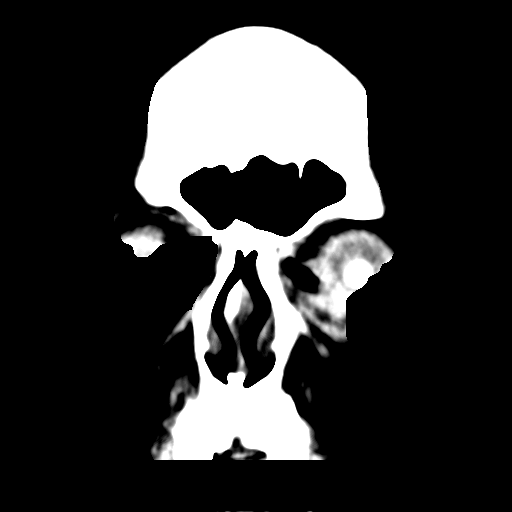
[im 19/24  bone]
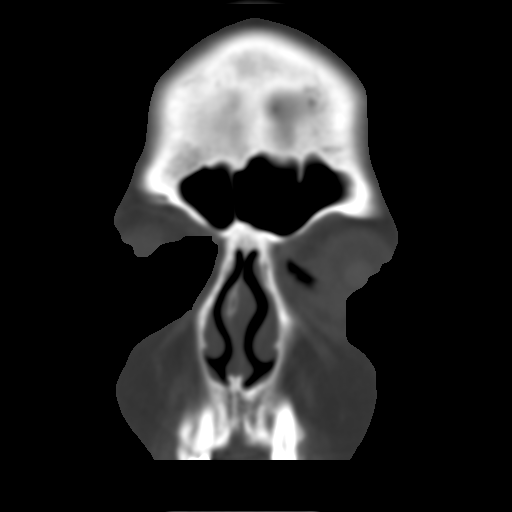
[im 20/24  bone]
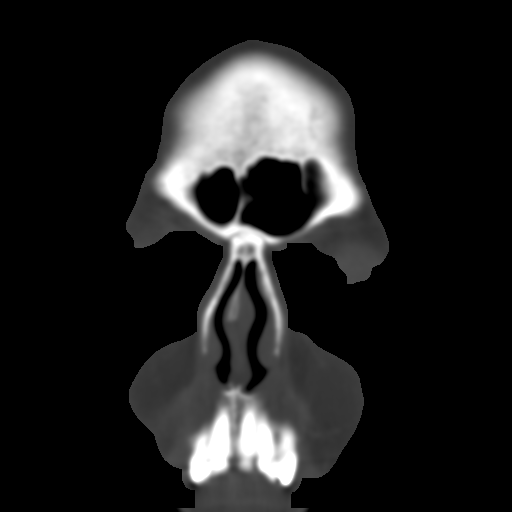
[im 21/24  bone]
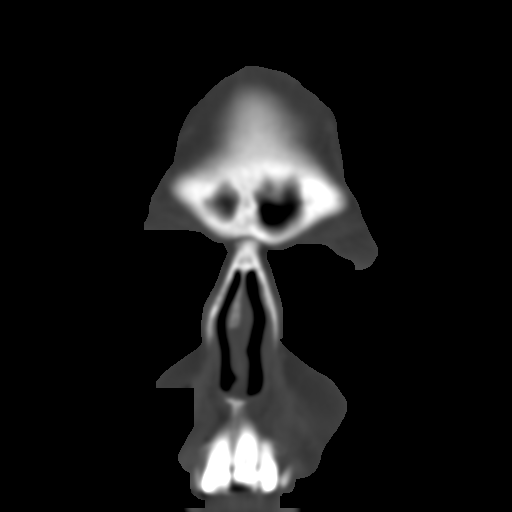
[im 23/24  bone]
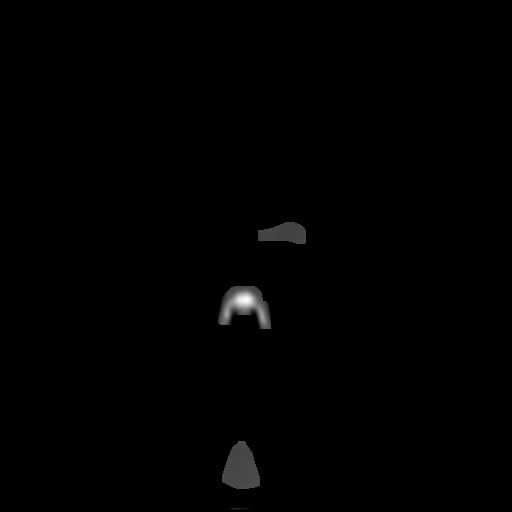

[16 of 25 positions shown; findings below may reference images not displayed]

FINDINGS: The frontal, ethmoid, maxillary, and sphenoid sinuses are clear. Nasal septum is deviated slightly towards the right. No lesion is evident.
IMPRESSION: Negative.

## 2007-03-15 ENCOUNTER — Telehealth: Payer: Self-pay | Admitting: Internal Medicine

## 2007-04-06 ENCOUNTER — Emergency Department (HOSPITAL_COMMUNITY): Admission: EM | Admit: 2007-04-06 | Discharge: 2007-04-06 | Payer: Self-pay | Admitting: Emergency Medicine

## 2007-04-13 ENCOUNTER — Telehealth: Payer: Self-pay | Admitting: Internal Medicine

## 2007-04-13 ENCOUNTER — Ambulatory Visit: Payer: Self-pay | Admitting: Internal Medicine

## 2007-04-13 DIAGNOSIS — K219 Gastro-esophageal reflux disease without esophagitis: Secondary | ICD-10-CM | POA: Insufficient documentation

## 2007-04-13 DIAGNOSIS — R55 Syncope and collapse: Secondary | ICD-10-CM | POA: Insufficient documentation

## 2007-04-28 ENCOUNTER — Telehealth: Payer: Self-pay | Admitting: Internal Medicine

## 2007-07-27 ENCOUNTER — Telehealth: Payer: Self-pay | Admitting: Internal Medicine

## 2007-07-28 ENCOUNTER — Ambulatory Visit: Payer: Self-pay | Admitting: Internal Medicine

## 2007-07-28 DIAGNOSIS — E785 Hyperlipidemia, unspecified: Secondary | ICD-10-CM | POA: Insufficient documentation

## 2007-08-05 ENCOUNTER — Telehealth: Payer: Self-pay | Admitting: Internal Medicine

## 2007-08-09 ENCOUNTER — Ambulatory Visit: Payer: Self-pay | Admitting: Internal Medicine

## 2007-08-25 ENCOUNTER — Ambulatory Visit: Payer: Self-pay | Admitting: Internal Medicine

## 2007-08-30 ENCOUNTER — Telehealth: Payer: Self-pay | Admitting: Internal Medicine

## 2007-08-31 ENCOUNTER — Ambulatory Visit: Payer: Self-pay | Admitting: Internal Medicine

## 2007-08-31 DIAGNOSIS — H6520 Chronic serous otitis media, unspecified ear: Secondary | ICD-10-CM | POA: Insufficient documentation

## 2007-10-07 ENCOUNTER — Ambulatory Visit: Payer: Self-pay | Admitting: Women's Health

## 2007-10-13 ENCOUNTER — Ambulatory Visit: Payer: Self-pay | Admitting: Internal Medicine

## 2007-10-13 LAB — CONVERTED CEMR LAB
ALT: 19 units/L (ref 0–35)
AST: 27 units/L (ref 0–37)
Albumin: 4 g/dL (ref 3.5–5.2)
Alkaline Phosphatase: 59 units/L (ref 39–117)
BUN: 14 mg/dL (ref 6–23)
Basophils Absolute: 0 10*3/uL (ref 0.0–0.1)
Basophils Relative: 0.5 % (ref 0.0–3.0)
Bilirubin Urine: NEGATIVE
Bilirubin, Direct: 0.1 mg/dL (ref 0.0–0.3)
CO2: 30 meq/L (ref 19–32)
Calcium: 9 mg/dL (ref 8.4–10.5)
Chloride: 105 meq/L (ref 96–112)
Cholesterol: 204 mg/dL (ref 0–200)
Creatinine, Ser: 0.8 mg/dL (ref 0.4–1.2)
Direct LDL: 109.6 mg/dL
Eosinophils Absolute: 0.1 10*3/uL (ref 0.0–0.7)
Eosinophils Relative: 2.1 % (ref 0.0–5.0)
GFR calc Af Amer: 94 mL/min
GFR calc non Af Amer: 78 mL/min
Glucose, Bld: 95 mg/dL (ref 70–99)
HCT: 41.1 % (ref 36.0–46.0)
HDL: 63 mg/dL (ref >39.0)
Hemoglobin, Urine: NEGATIVE
Hemoglobin: 14.1 g/dL (ref 12.0–15.0)
Ketones, ur: NEGATIVE mg/dL
Leukocytes, UA: NEGATIVE
Lymphocytes Relative: 32.2 % (ref 12.0–46.0)
MCHC: 34.3 g/dL (ref 30.0–36.0)
MCV: 91.9 fL (ref 78.0–100.0)
Monocytes Absolute: 0.5 10*3/uL (ref 0.1–1.0)
Monocytes Relative: 7.6 % (ref 3.0–12.0)
Neutro Abs: 4 10*3/uL (ref 1.4–7.7)
Neutrophils Relative %: 57.6 % (ref 43.0–77.0)
Nitrite: NEGATIVE
Platelets: 206 10*3/uL (ref 150–400)
Potassium: 4.3 meq/L (ref 3.5–5.1)
RBC: 4.48 M/uL (ref 3.87–5.11)
RDW: 12 % (ref 11.5–14.6)
Sodium: 141 meq/L (ref 135–145)
Specific Gravity, Urine: 1.015 (ref 1.000–1.03)
TSH: 1.21 microintl units/mL (ref 0.35–5.50)
Total Bilirubin: 0.6 mg/dL (ref 0.3–1.2)
Total CHOL/HDL Ratio: 3.2
Total Protein, Urine: NEGATIVE mg/dL
Total Protein: 6.6 g/dL (ref 6.0–8.3)
Triglycerides: 136 mg/dL (ref 0–149)
Urine Glucose: NEGATIVE mg/dL
Urobilinogen, UA: 0.2 (ref 0.0–1.0)
VLDL: 27 mg/dL (ref 0–40)
WBC: 6.8 10*3/uL (ref 4.5–10.5)
pH: 5 (ref 5.0–8.0)

## 2007-10-19 ENCOUNTER — Ambulatory Visit: Payer: Self-pay | Admitting: Internal Medicine

## 2007-10-19 DIAGNOSIS — R21 Rash and other nonspecific skin eruption: Secondary | ICD-10-CM | POA: Insufficient documentation

## 2007-11-29 ENCOUNTER — Encounter: Payer: Self-pay | Admitting: Internal Medicine

## 2007-12-14 ENCOUNTER — Ambulatory Visit: Payer: Self-pay | Admitting: Gynecology

## 2007-12-15 ENCOUNTER — Ambulatory Visit: Payer: Self-pay | Admitting: Gynecology

## 2007-12-15 ENCOUNTER — Other Ambulatory Visit: Admission: RE | Admit: 2007-12-15 | Discharge: 2007-12-15 | Payer: Self-pay | Admitting: Gynecology

## 2007-12-15 ENCOUNTER — Encounter: Payer: Self-pay | Admitting: Gynecology

## 2007-12-17 ENCOUNTER — Telehealth: Payer: Self-pay | Admitting: Internal Medicine

## 2008-03-06 ENCOUNTER — Ambulatory Visit: Payer: Self-pay | Admitting: Women's Health

## 2008-08-11 ENCOUNTER — Telehealth: Payer: Self-pay | Admitting: Internal Medicine

## 2008-10-12 ENCOUNTER — Ambulatory Visit: Payer: Self-pay | Admitting: Internal Medicine

## 2008-10-13 LAB — CONVERTED CEMR LAB
ALT: 19 units/L (ref 0–35)
AST: 25 units/L (ref 0–37)
Albumin: 4 g/dL (ref 3.5–5.2)
Alkaline Phosphatase: 49 units/L (ref 39–117)
BUN: 11 mg/dL (ref 6–23)
Basophils Absolute: 0 10*3/uL (ref 0.0–0.1)
Basophils Relative: 1 % (ref 0.0–3.0)
Bilirubin Urine: NEGATIVE
Bilirubin, Direct: 0.1 mg/dL (ref 0.0–0.3)
CO2: 29 meq/L (ref 19–32)
Calcium: 8.9 mg/dL (ref 8.4–10.5)
Chloride: 106 meq/L (ref 96–112)
Cholesterol: 208 mg/dL — ABNORMAL HIGH (ref 0–200)
Creatinine, Ser: 0.7 mg/dL (ref 0.4–1.2)
Direct LDL: 133.2 mg/dL
Eosinophils Absolute: 0.4 10*3/uL (ref 0.0–0.7)
Eosinophils Relative: 7.8 % — ABNORMAL HIGH (ref 0.0–5.0)
GFR calc non Af Amer: 90.7 mL/min (ref >60)
Glucose, Bld: 84 mg/dL (ref 70–99)
HCT: 43.3 % (ref 36.0–46.0)
HDL: 59.6 mg/dL (ref >39.00)
Hemoglobin, Urine: NEGATIVE
Hemoglobin: 14.8 g/dL (ref 12.0–15.0)
Ketones, ur: NEGATIVE mg/dL
Lymphocytes Relative: 43.2 % (ref 12.0–46.0)
Lymphs Abs: 2.1 10*3/uL (ref 0.7–4.0)
MCHC: 34.2 g/dL (ref 30.0–36.0)
MCV: 95.7 fL (ref 78.0–100.0)
Monocytes Absolute: 0.4 10*3/uL (ref 0.1–1.0)
Monocytes Relative: 7.5 % (ref 3.0–12.0)
Neutro Abs: 1.9 10*3/uL (ref 1.4–7.7)
Neutrophils Relative %: 40.5 % — ABNORMAL LOW (ref 43.0–77.0)
Nitrite: NEGATIVE
Platelets: 166 10*3/uL (ref 150.0–400.0)
Potassium: 3.9 meq/L (ref 3.5–5.1)
RBC: 4.53 M/uL (ref 3.87–5.11)
RDW: 12.3 % (ref 11.5–14.6)
Sodium: 136 meq/L (ref 135–145)
Specific Gravity, Urine: <=1.005 (ref 1.000–1.030)
TSH: 2.1 microintl units/mL (ref 0.35–5.50)
Total Bilirubin: 0.9 mg/dL (ref 0.3–1.2)
Total CHOL/HDL Ratio: 3
Total Protein, Urine: NEGATIVE mg/dL
Total Protein: 6.8 g/dL (ref 6.0–8.3)
Triglycerides: 71 mg/dL (ref 0.0–149.0)
Urine Glucose: NEGATIVE mg/dL
Urobilinogen, UA: 0.2 (ref 0.0–1.0)
VLDL: 14.2 mg/dL (ref 0.0–40.0)
WBC: 4.8 10*3/uL (ref 4.5–10.5)
pH: 8 (ref 5.0–8.0)

## 2008-10-19 ENCOUNTER — Ambulatory Visit: Payer: Self-pay | Admitting: Internal Medicine

## 2008-10-19 DIAGNOSIS — Z87891 Personal history of nicotine dependence: Secondary | ICD-10-CM | POA: Insufficient documentation

## 2008-10-31 ENCOUNTER — Telehealth: Payer: Self-pay | Admitting: Internal Medicine

## 2008-12-08 ENCOUNTER — Encounter: Payer: Self-pay | Admitting: Internal Medicine

## 2008-12-19 ENCOUNTER — Other Ambulatory Visit: Admission: RE | Admit: 2008-12-19 | Discharge: 2008-12-19 | Payer: Self-pay | Admitting: Gynecology

## 2008-12-19 ENCOUNTER — Ambulatory Visit: Payer: Self-pay | Admitting: Gynecology

## 2009-03-15 ENCOUNTER — Telehealth: Payer: Self-pay | Admitting: Internal Medicine

## 2009-04-26 ENCOUNTER — Telehealth: Payer: Self-pay | Admitting: Gastroenterology

## 2009-05-11 ENCOUNTER — Encounter (INDEPENDENT_AMBULATORY_CARE_PROVIDER_SITE_OTHER): Payer: Self-pay | Admitting: *Deleted

## 2009-05-14 ENCOUNTER — Ambulatory Visit: Payer: Self-pay | Admitting: Gastroenterology

## 2009-05-31 ENCOUNTER — Ambulatory Visit: Payer: Self-pay | Admitting: Gastroenterology

## 2009-05-31 LAB — HM COLONOSCOPY

## 2009-08-16 ENCOUNTER — Telehealth: Payer: Self-pay | Admitting: Internal Medicine

## 2009-11-07 ENCOUNTER — Telehealth: Payer: Self-pay | Admitting: Internal Medicine

## 2009-12-05 ENCOUNTER — Ambulatory Visit: Payer: Self-pay | Admitting: Internal Medicine

## 2009-12-05 LAB — CONVERTED CEMR LAB
ALT: 21 units/L (ref 0–35)
AST: 24 units/L (ref 0–37)
Albumin: 4.1 g/dL (ref 3.5–5.2)
Alkaline Phosphatase: 56 units/L (ref 39–117)
BUN: 15 mg/dL (ref 6–23)
Basophils Absolute: 0.1 10*3/uL (ref 0.0–0.1)
Basophils Relative: 1 % (ref 0.0–3.0)
Bilirubin Urine: NEGATIVE
Bilirubin, Direct: 0 mg/dL (ref 0.0–0.3)
CO2: 29 meq/L (ref 19–32)
Calcium: 8.9 mg/dL (ref 8.4–10.5)
Chloride: 104 meq/L (ref 96–112)
Cholesterol: 256 mg/dL — ABNORMAL HIGH (ref 0–200)
Creatinine, Ser: 0.8 mg/dL (ref 0.4–1.2)
Direct LDL: 149.1 mg/dL
Eosinophils Absolute: 0.6 10*3/uL (ref 0.0–0.7)
Eosinophils Relative: 9.2 % — ABNORMAL HIGH (ref 0.0–5.0)
GFR calc non Af Amer: 82.17 mL/min (ref >60)
Glucose, Bld: 85 mg/dL (ref 70–99)
HCT: 40.4 % (ref 36.0–46.0)
HDL: 74.2 mg/dL (ref >39.00)
Hemoglobin, Urine: NEGATIVE
Hemoglobin: 13.8 g/dL (ref 12.0–15.0)
Ketones, ur: NEGATIVE mg/dL
Lymphocytes Relative: 43.9 % (ref 12.0–46.0)
Lymphs Abs: 2.6 10*3/uL (ref 0.7–4.0)
MCHC: 34.1 g/dL (ref 30.0–36.0)
MCV: 94.7 fL (ref 78.0–100.0)
Monocytes Absolute: 0.5 10*3/uL (ref 0.1–1.0)
Monocytes Relative: 7.5 % (ref 3.0–12.0)
Neutro Abs: 2.3 10*3/uL (ref 1.4–7.7)
Neutrophils Relative %: 38.4 % — ABNORMAL LOW (ref 43.0–77.0)
Nitrite: NEGATIVE
Platelets: 197 10*3/uL (ref 150.0–400.0)
Potassium: 4.3 meq/L (ref 3.5–5.1)
RBC: 4.27 M/uL (ref 3.87–5.11)
RDW: 13.7 % (ref 11.5–14.6)
Sodium: 140 meq/L (ref 135–145)
Specific Gravity, Urine: 1.01 (ref 1.000–1.030)
TSH: 5.2 microintl units/mL (ref 0.35–5.50)
Total Bilirubin: 0.3 mg/dL (ref 0.3–1.2)
Total CHOL/HDL Ratio: 3
Total Protein, Urine: NEGATIVE mg/dL
Total Protein: 6.6 g/dL (ref 6.0–8.3)
Triglycerides: 115 mg/dL (ref 0.0–149.0)
Urine Glucose: NEGATIVE mg/dL
Urobilinogen, UA: 0.2 (ref 0.0–1.0)
VLDL: 23 mg/dL (ref 0.0–40.0)
WBC: 6 10*3/uL (ref 4.5–10.5)
pH: 7.5 (ref 5.0–8.0)

## 2009-12-07 ENCOUNTER — Ambulatory Visit: Payer: Self-pay | Admitting: Internal Medicine

## 2010-01-06 HISTORY — PX: ANKLE FRACTURE SURGERY: SHX122

## 2010-01-12 ENCOUNTER — Ambulatory Visit: Admission: RE | Admit: 2010-01-12 | Payer: Self-pay | Source: Home / Self Care | Admitting: Family Medicine

## 2010-02-05 NOTE — Progress Notes (Signed)
Summary: Switch Providers   Phone Note Call from Patient Call back at Home Phone [phone]   Caller: Patient Call For: Dr. Jarold Motto Reason for Call: Talk to Nurse Summary of Call: Pt. saw Dr. Jarold Motto for Colonoscopy 10 yrs ago. Would like to switch to Dr. Russella Dar b/c her husband is Dr. Ardell Isaacs pt.  Will this be okay? Initial call taken by: Karna Christmas,  April 26, 2009 4:50 PM  Follow-up for Phone Call        of course Follow-up by: Mardella Layman MD FACG,  April 30, 2009 8:34 AM  Additional Follow-up for Phone Call Additional follow up Details #1::        Dr. Russella Dar, will you accept this pt? Francee Piccolo CMA Duncan Dull)  April 30, 2009 10:23 AM     Additional Follow-up for Phone Call Additional follow up Details #2::    Yes Follow-up by: Meryl Dare MD Clementeen Graham,  May 01, 2009 11:41 AM  Additional Follow-up for Phone Call Additional follow up Details #3:: Details for Additional Follow-up Action Taken: Pt is scheduled for 05/31/09 @ 2pm as a direct colon.  She is not having any problems.  No DM or BT. Additional Follow-up by: Francee Piccolo CMA Duncan Dull),  May 04, 2009 4:16 PM

## 2010-02-05 NOTE — Progress Notes (Signed)
Summary: Shingles  Phone Note Call from Patient   Summary of Call: Pt wants to know if it is ok to have shingles vac, is only 56, but father has shingles now. Recc age is 58, does she need to wait?  Initial call taken by: Lamar Sprinkles,  August 05, 2007 9:01 AM  Follow-up for Phone Call        It is Ok to have if she wants it bad, but the insurance is not likely to pay Follow-up by: Tresa Garter MD,  August 05, 2007 1:20 PM  Additional Follow-up for Phone Call Additional follow up Details #1::        Pt's husband informed, will check with insurance  Additional Follow-up by: Lamar Sprinkles,  August 05, 2007 4:54 PM

## 2010-02-05 NOTE — Assessment & Plan Note (Signed)
Summary: CPX / $50 /[employer]   Vital Signs:  Patient profile:   60 year old female Height:      63 inches Weight:      138 pounds BMI:     24.53 Temp:     96.6 degrees F oral Pulse rate:   71 / minute BP sitting:   112 / 82  (left arm)  Vitals Entered By: Tora Perches (October 19, 2008 8:51 AM) CC: cpx Is Patient Diabetic? No   CC:  cpx.  History of Present Illness: The patient presents for a wellness examination   Allergies: 1)  ! * Shellfish  Past History:  Past Medical History: Last updated: 10/19/2007 Anxiety Depression Allergic rhinitis GERD Hyperlipidemia GYN Dr Audie Box  Family History: Last updated: 07/28/2007 Family History Hypertension M - pacemaker father with CAD/CABG in his 37's  Social History: Last updated: 07/28/2007 Retired Married Former Smoker Regular exercise-yes 2 biol children - 1 disabled with schizophrenia & 1 step child  Past Surgical History: Appendectomy Hysterectomy, partial s/p breast reduction surgury s/p facelift  Review of Systems  The patient denies anorexia, fever, weight loss, weight gain, vision loss, decreased hearing, hoarseness, chest pain, syncope, dyspnea on exertion, peripheral edema, prolonged cough, headaches, hemoptysis, abdominal pain, melena, hematochezia, severe indigestion/heartburn, hematuria, incontinence, genital sores, muscle weakness, suspicious skin lesions, transient blindness, difficulty walking, depression, unusual weight change, abnormal bleeding, enlarged lymph nodes, angioedema, and breast masses.    Physical Exam  General:  Well-developed,well-nourished,in no acute distress; alert,appropriate and cooperative throughout examination Head:  Normocephalic and atraumatic without obvious abnormalities. No apparent alopecia or balding. Eyes:  No corneal or conjunctival inflammation noted. EOMI. Perrla. Funduscopic exam benign, without hemorrhages, exudates or papilledema. Vision grossly normal.  Ears:  External ear exam shows no significant lesions or deformities.  Otoscopic examination reveals clear canals, tympanic membranes are intact bilaterally without bulging, retraction, inflammation or discharge. Hearing is grossly normal bilaterally. Nose:  External nasal examination shows no deformity or inflammation. Nasal mucosa are pink and moist without lesions or exudates. Mouth:  Oral mucosa and oropharynx without lesions or exudates.  Teeth in good repair. Neck:  No deformities, masses, or tenderness noted. Lungs:  Normal respiratory effort, chest expands symmetrically. Lungs are clear to auscultation, no crackles or wheezes. Heart:  Normal rate and regular rhythm. S1 and S2 normal without gallop, murmur, click, rub or other extra sounds. Abdomen:  Bowel sounds positive,abdomen soft and non-tender without masses, organomegaly or hernias noted. Msk:  No deformity or scoliosis noted of thoracic or lumbar spine.   Pulses:  R and L carotid,radial,femoral,dorsalis pedis and posterior tibial pulses are full and equal bilaterally Extremities:  No clubbing, cyanosis, edema, or deformity noted with normal full range of motion of all joints.   Neurologic:  No cranial nerve deficits noted. Station and gait are normal. Plantar reflexes are down-going bilaterally. DTRs are symmetrical throughout. Sensory, motor and coordinative functions appear intact. Skin:  papular rash arond neck at base Psych:  Cognition and judgment appear intact. Alert and cooperative with normal attention span and concentration. No apparent delusions, illusions, hallucinations   Impression & Recommendations:  Problem # 1:  PHYSICAL EXAMINATION (ICD-V70.0)  Health and age related issues were discussed. Available screening tests and vaccinations were discussed as well. Healthy life style including good diet and execise was discussed. The labs were reviewed with the patient. She is on gluten free diet Had a flu shot   Problem # 2:  CYSTITIS (ICD-595.9)  Her updated medication  list for this problem includes:    Keflex 500 Mg Caps (Cephalexin) .[name]... As dirrected  Complete Medication List: 1)  Alprazolam 1 Mg Tabs (Alprazolam) .... 1/2 - 1 by mouth qam and 1-2 by mouth at bedtime as needed 2)  Protonix 40 Mg Tbec (Pantoprazole sodium) .[name].. 1 once daily 3)  Fluticasone Propionate 50 Mcg/act Susp (Fluticasone propionate) .... 2 spray/side once daily 4)  Vitamin D3 1000 Unit Tabs (Cholecalciferol) .[name].. 1 by mouth daily 5)  Keflex 500 Mg Caps (Cephalexin) .... As dirrected  Patient Instructions: 1)  Try to eat more raw plant food, fresh and dry fruit, raw almonds, leafy vegetables, whole foods and less red meat, less animal fat. Poultry and fish is better for you than pork and beef. Avoid processed foods (canned soups, hot dogs, sausage, bacon , frozen dinners). Avoid corn syrup, high fructose syrup or aspartam and Splenda  containing drinks. Honey, Agave and Stevia are better sweeteners. Make your own  dressing with olive oil, wine vinegar, lemon juce, garlic etc. for your salads. Prescriptions: KEFLEX 500 MG CAPS (CEPHALEXIN) as dirrected  #30 x 6   Entered and Authorized by:   Tresa Garter MD   Signed by:   Tresa Garter MD on 10/19/2008   Method used:   Electronically to        [facility]  [phone]* (retail)       [address]       [place], [place]  [address]       Ph: [phone] or [phone]       Fax: [phone]   RxID:   [id number]

## 2010-02-05 NOTE — Progress Notes (Signed)
Summary: Ellen [name] pt  Medications Added CIPRO 250 MG TABS (CIPROFLOXACIN HCL) two times a day       Phone Note Call from Patient Call back at [facility] Phone [phone]   Summary of Call: Pt says that she get's uti's when flying. She will be flying out of town thursday. Would you fill the cipro rx for pt to keep on hand. Initial call taken by: Lamar Sprinkles,  February 09, 2007 11:57 AM  Follow-up for Phone Call        ok to refill cipro refill x2 Follow-up by: Dondra Spry DO,  February 09, 2007 1:01 PM  Additional Follow-up for Phone Call Additional follow up Details #1::        Pt informed  Additional Follow-up by: Lamar Sprinkles,  February 09, 2007 4:44 PM    New/Updated Medications: CIPRO 250 MG TABS (CIPROFLOXACIN HCL) two times a day   Prescriptions: CIPRO 250 MG TABS (CIPROFLOXACIN HCL) two times a day  #10 x 2   Entered by:   Lamar Sprinkles   Authorized by:   Tresa Garter MD   Signed by:   Lamar Sprinkles on 02/09/2007   Method used:   Electronically sent to ...       [facility]  [address]  [phone]*       [address]       [place], [place]  [address]       Ph: [phone] or [phone]       Fax: [phone]   RxID:   [id number]

## 2010-02-05 NOTE — Assessment & Plan Note (Signed)
Summary: per emr/$50/pn   Vital Signs:  Patient Profile:   60 Years Old Female Weight:      149 pounds Temp:     97.3 degrees F oral Pulse rate:   64 / minute BP sitting:   126 / 70  (left arm)  Vitals Entered By: Tora Perches (August 31, 2007 11:09 AM)                 Chief Complaint:  Multiple medical problems or concerns.  History of Present Illness: C/o sinus and ear inf. several times. Took Clarithro, then Cipro and ear gtt now. C/ o pain in R ear worse at night.    Current Allergies (reviewed today): ! * SHELLFISH  Past Medical History:    Reviewed history from 07/28/2007 and no changes required:       Anxiety       Depression       Allergic rhinitis       GERD       Hyperlipidemia   Family History:    Reviewed history from 07/28/2007 and no changes required:       Family History Hypertension       M - pacemaker       father with CAD/CABG in his 82's  Social History:    Reviewed history from 07/28/2007 and no changes required:       Retired       Married       Former Smoker       Regular exercise-yes       2 biol children - 1 disabled with schizophrenia & 1 step child     Physical Exam  General:     NAD Ears:     L ear normal, R TM erythema, R TM retraction, and R decreased hearing.   Nose:     External nasal examination shows no deformity or inflammation. Nasal mucosa are pink and moist without lesions or exudates. Mouth:     Oral mucosa and oropharynx without lesions or exudates.  Teeth in good repair. Neck:     No deformities, masses, or tenderness noted. Lungs:     Normal respiratory effort, chest expands symmetrically. Lungs are clear to auscultation, no crackles or wheezes. Heart:     Normal rate and regular rhythm. S1 and S2 normal without gallop, murmur, click, rub or other extra sounds.    Impression & Recommendations:  Problem # 1:  OTITIS MEDIA, CHRONIC SEROUS (ICD-381.10) R Assessment: Deteriorated D/c Cipro.  Ceftin x 3 wks Predn. taper Orders: ENT Referral (ENT)   Problem # 2:  SINUSITIS- ACUTE-NOS (ICD-461.9) Assessment: New  Her updated medication list for this problem includes:    Fluticasone Propionate 50 Mcg/act Susp (Fluticasone propionate) .[name]... 2 spray/side once daily    Ciprofloxacin Hcl 500 Mg Tabs (Ciprofloxacin hcl) .[name]... 1po two times a day    Cefuroxime Axetil 500 Mg Tabs (Cefuroxime axetil) .[name]... 1 by mouth 2 times daily   Complete Medication List: 1)  Allegra 180 Mg Tabs (Fexofenadine hcl) .... Once daily prn 2)  Premarin 0.625 Mg/gm Crea (Estrogens, conjugated) .... Use as dirrected pv 3)  Alprazolam 1 Mg Tabs (Alprazolam) .... 1/2 - 1 by mouth qam and 1-2 by mouth at bedtime as needed 4)  Protonix 40 Mg Tbec (Pantoprazole sodium) .[name].. 1 once daily 5)  Vitamin D3 1000 Unit Tabs (Cholecalciferol) .[name].. 1 once daily po 6)  Fluticasone Propionate 50 Mcg/act Susp (Fluticasone propionate) .... 2 spray/side once  daily 7)  Cortisporin 5-10000-1 Susp (Neomycin-polymyxin-hc) .... 2 gtts every 6 hrs for 10 days 8)  Ciprofloxacin Hcl 500 Mg Tabs (Ciprofloxacin hcl) .[name].. 1po two times a day 9)  Cefuroxime Axetil 500 Mg Tabs (Cefuroxime axetil) .[name].. 1 by mouth 2 times daily 10)  Prednisone 10 Mg Tabs (Prednisone) .... Take 40mg  qd for 3 days, then 20 mg qd for 3 days, then 10mg  qd for 6 days, then stop. take pc. 11)  Darvocet-n 100 100-650 Mg Tabs (Propoxyphene n-apap) .[name].. 1 by mouth qid as needed pain  Other Orders: ENT Referral (ENT)   Patient Instructions: 1)  Stop Cipro   Prescriptions: DARVOCET-N 100 100-650 MG TABS (PROPOXYPHENE N-APAP) 1 by mouth qid as needed pain  #60 x 0   Entered and Authorized by:   Tresa Garter MD   Signed by:   Tresa Garter MD on 08/31/2007   Method used:   Print then Give to Patient   RxID:   [id number]  PREDNISONE 10 MG  TABS (PREDNISONE) Take 40mg  qd for 3 days, then 20 mg qd for 3 days, then 10mg  qd for 6 days, then stop. Take pc.  #24 x 0   Entered and Authorized by:   Tresa Garter MD   Signed by:   Tresa Garter MD on 08/31/2007   Method used:   Print then Give to Patient   RxID:   [id number] CEFUROXIME AXETIL 500 MG  TABS (CEFUROXIME AXETIL) 1 by mouth 2 times daily  #42 x 0   Entered and Authorized by:   Tresa Garter MD   Signed by:   Tresa Garter MD on 08/31/2007   Method used:   Print then Give to Patient   RxID:   [id number]  ]

## 2010-02-05 NOTE — Assessment & Plan Note (Signed)
Summary: DISCUSS SLEEP PROBLEMS/NML  Medications Added PREMARIN 0.625 MG/GM  CREA (ESTROGENS, CONJUGATED) use as dirrected PV KEFLEX 500 MG CAPS (CEPHALEXIN) 1 by mouth two times a day as dirr. for UTIs VITAMIN D3 1000 UNIT  TABS (CHOLECALCIFEROL) 1 once daily po NORTRIPTYLINE HCL 10 MG  CAPS (NORTRIPTYLINE HCL) 1 by mouth qhs        Vital Signs:  Patient Profile:   60 Years Old Female Weight:      149 pounds Temp:     98.8 degrees F oral Pulse rate:   67 / minute BP sitting:   106 / 66  (left arm)  Vitals Entered By: Tora Perches (February 17, 2007 8:30 AM)                 Chief Complaint:  Multiple medical problems or concerns.  History of Present Illness: C/o  insomnia, stress. Sleeps 3-4 hrs with Lunesta. Can't relax. C/o UTI's with burning almost weekly    Current Allergies: ! * SHELLFISH  Past Medical History:    Reviewed history from 10/16/2006 and no changes required:       Anxiety       Depression       Allergic rhinitis  Past Surgical History:    Reviewed history from 10/16/2006 and no changes required:       Appendectomy       Hysterectomy   Family History:    Reviewed history from 10/16/2006 and no changes required:       n/a       Family History Hypertension  Social History:    Reviewed history from 10/16/2006 and no changes required:       Retired       Married       Former Smoker       Regular exercise-yes    Review of Systems       Hot flashes at 60 yo, dry vag. mucosa.   Physical Exam  General:     Well-developed,well-nourished,in no acute distress; alert,appropriate and cooperative throughout examination Nose:     External nasal examination shows no deformity or inflammation. Nasal mucosa are pink and moist without lesions or exudates. Mouth:     Oral mucosa and oropharynx without lesions or exudates.  Teeth in good repair. Neck:     No deformities, masses, or tenderness noted. Lungs:      Normal respiratory effort, chest expands symmetrically. Lungs are clear to auscultation, no crackles or wheezes. Heart:     Normal rate and regular rhythm. S1 and S2 normal without gallop, murmur, click, rub or other extra sounds. Abdomen:     Bowel sounds positive,abdomen soft and non-tender without masses, organomegaly or hernias noted. Msk:     No deformity or scoliosis noted of thoracic or lumbar spine.   Neurologic:     No cranial nerve deficits noted. Station and gait are normal. Plantar reflexes are down-going bilaterally. DTRs are symmetrical throughout. Sensory, motor and coordinative functions appear intact. Skin:     Clear    Impression & Recommendations:  Problem # 1:  CYSTITIS, ACUTE, RECURRENT (ICD-595.0) Assessment: Unchanged Premarin cream The following medications were removed from the medication list:    Cipro 250 Mg Tabs (Ciprofloxacin hcl) .[name]..[name] Two times a day  Her updated medication list for this problem includes:    Keflex 500 Mg Caps (Cephalexin) .[name]... 1 by mouth two times a day as dirr. for utis   Problem # 2:  INSOMNIA-SLEEP DISORDER-UNSPEC (  ICD-307.40) As above  Problem # 3:  ANXIETY (ICD-300.00) Assessment: Deteriorated  Her updated medication list for this problem includes:    Nortriptyline Hcl 10 Mg Caps (Nortriptyline hcl) .[name]... 1 by mouth qhs   Problem # 4:  MENOPAUSAL SYNDROME (ICD-627.0) Assessment: Unchanged  Her updated medication list for this problem includes:    Premarin 0.625 Mg/gm Crea (Estrogens, conjugated) ..... Use as dirrected pv   Complete Medication List: 1)  Allegra 180 Mg Tabs (Fexofenadine hcl) .... Once daily prn 2)  Lunesta 3 Mg Tabs (Eszopiclone) .... At bedtime prn 3)  Premarin 0.625 Mg/gm Crea (Estrogens, conjugated) .... Use as dirrected pv 4)  Keflex 500 Mg Caps (Cephalexin) .[name].. 1 by mouth two times a day as dirr. for utis 5)  Vitamin D3 1000 Unit Tabs (Cholecalciferol) .[name].. 1 once daily po  6)  Nortriptyline Hcl 10 Mg Caps (Nortriptyline hcl) .[name].. 1 by mouth qhs   Patient Instructions: 1)  Please schedule a follow-up appointment in 3 months. 2)  BMP prior to visit, ICD-9: 3)  TSH prior to visit, ICD-9:995.2  595.0 4)  Urine-dip prior to visit, ICD-9: 5)  Vit D    Prescriptions: NORTRIPTYLINE HCL 10 MG  CAPS (NORTRIPTYLINE HCL) 1 by mouth qhs  #30 x 6   Entered and Authorized by:   Tresa Garter MD   Signed by:   Tresa Garter MD on 02/17/2007   Method used:   Electronically sent to ...       [facility]  [address]  [phone]*       [address]       [place], [place]  [address]       Ph: [phone] or [phone]       Fax: [phone]   RxID:   [id number] KEFLEX 500 MG CAPS (CEPHALEXIN) 1 by mouth two times a day as dirr. for UTIs  #60 x 3   Entered and Authorized by:   Tresa Garter MD   Signed by:   Tresa Garter MD on 02/17/2007   Method used:   Electronically sent to ...       [facility]  [address]  [phone]*       [address]       [place], [place]  [address]       Ph: [phone] or [phone]       Fax: [phone]   RxID:   [id number] PREMARIN 0.625 MG/GM  CREA (ESTROGENS, CONJUGATED) use as dirrected PV  #qs x 12   Entered and Authorized by:   Tresa Garter MD   Signed by:   Tresa Garter MD on 02/17/2007   Method used:   Electronically sent to ...       [facility]  [address]  [phone]*       [address]       [place], [place]  [address]       Ph: [phone] or [phone]       Fax: [phone]   RxID:   [id number]  ]

## 2010-02-05 NOTE — Progress Notes (Signed)
Summary: AMBIEN CR?  Medications Added AMBIEN CR 12.5 MG  TBCR (ZOLPIDEM TARTRATE) 1 by mouth at bedtime as needed insomnia       Phone Note Call from Patient Call back at [facility] Phone [phone]   Summary of Call: Pt says that lunesta helps her fall asleep but she wakes up frequently and can not go back to sleep. Patient is requesting ambien CR.  Initial call taken by: Lamar Sprinkles,  March 15, 2007 9:06 AM  Follow-up for Phone Call        OK Follow-up by: Tresa Garter MD,  March 15, 2007 1:08 PM  Additional Follow-up for Phone Call Additional follow up Details #1::        Lf mess for pt, rx called into pharm Additional Follow-up by: Lamar Sprinkles,  March 15, 2007 2:50 PM    New/Updated Medications: AMBIEN CR 12.5 MG  TBCR (ZOLPIDEM TARTRATE) 1 by mouth at bedtime as needed insomnia   Prescriptions: AMBIEN CR 12.5 MG  TBCR (ZOLPIDEM TARTRATE) 1 by mouth at bedtime as needed insomnia  #30 x 6   Entered by:   Lamar Sprinkles   Authorized by:   Tresa Garter MD   Signed by:   Lamar Sprinkles on 03/15/2007   Method used:   Telephoned to ...       [facility]  [address]  [phone]*       [address]       [place], [place]  [address]       Ph: [phone] or [phone]       Fax: [phone]   RxID:   [id number] AMBIEN CR 12.5 MG  TBCR (ZOLPIDEM TARTRATE) 1 by mouth at bedtime as needed insomnia  #30 x 6   Entered and Authorized by:   Tresa Garter MD   Signed by:   Tresa Garter MD on 03/15/2007   Method used:   Print then Give to Patient   RxID:   [id number]

## 2010-02-05 NOTE — Progress Notes (Signed)
Summary: orange pills  Phone Note Call from Patient   Summary of Call: The "little orange pills that you take before you eat to prevent gas" was not sent to the pharmacy. Pt also has stopped nortriptlyine. Initial call taken by: Lamar Sprinkles,  April 13, 2007 3:15 PM  Follow-up for Phone Call        Pls, fax Protonix rx to pharm. Follow-up by: Tresa Garter MD,  April 13, 2007 4:45 PM  Additional Follow-up for Phone Call Additional follow up Details #1::        Pt did already get the Protonix. She keeps saying that it is the little orange pill you take prior to eating. Pt was on med years ago and says that she discussed it with you at office visit.   Additional Follow-up by: Lamar Sprinkles,  April 13, 2007 5:26 PM    Additional Follow-up for Phone Call Additional follow up Details #2::    I thought it was Protonix. What are they?  I have no idea, pt does not know but says that she talked to you about it. All I know is that it's orange (or was at one time) and you take it 3 times a day before meals ..................................................................Marland KitchenLamar Sprinkles  April 13, 2007 5:44 PM  Follow-up by: Tresa Garter MD,  April 13, 2007 5:30 PM  Additional Follow-up for Phone Call Additional follow up Details #3:: Details for Additional Follow-up Action Taken: She does not need it. Just Protonix alone would be fine  Lf mess for pt ..................................................................Marland KitchenLamar Sprinkles  April 15, 2007 1:26 PM  Additional Follow-up by: Tresa Garter MD,  April 14, 2007 12:40 PM

## 2010-02-05 NOTE — Progress Notes (Signed)
Summary: ABX?  Medications Added ZITHROMAX Z-PAK 250 MG  TABS (AZITHROMYCIN) as dirr.       Phone Note Call from Patient   Summary of Call: Can you call in rx for pt's sinus infection. She had ct today & does not want to wait until next week to start new meds.  Initial call taken by: Lamar Sprinkles,  February 26, 2007 3:43 PM  Follow-up for Phone Call        OK Follow-up by: Tresa Garter MD,  February 26, 2007 5:26 PM  Additional Follow-up for Phone Call Additional follow up Details #1::        What do you want to call in? Additional Follow-up by: Lamar Sprinkles,  February 26, 2007 6:22 PM    Additional Follow-up for Phone Call Additional follow up Details #2::    Zpack. I guess, it did not save. Thx, Follow-up by: Tresa Garter MD,  February 27, 2007 11:13 AM  Additional Follow-up for Phone Call Additional follow up Details #3:: Details for Additional Follow-up Action Taken: Pt informed  Additional Follow-up by: Lamar Sprinkles,  March 01, 2007 9:05 AM  New/Updated Medications: ZITHROMAX Z-PAK 250 MG  TABS (AZITHROMYCIN) as dirr.   Prescriptions: ZITHROMAX Z-PAK 250 MG  TABS (AZITHROMYCIN) as dirr.  #1 x 0   Entered and Authorized by:   Tresa Garter MD   Signed by:   Tresa Garter MD on 02/27/2007   Method used:   Electronically sent to ...       [facility]  [address]  [phone]*       [address]       [place], [place]  [address]       Ph: [phone] or [phone]       Fax: [phone]   RxID:   [id number]

## 2010-02-05 NOTE — Progress Notes (Signed)
Summary: Mail order   Phone Note Call from Patient Call back at Home Phone [phone]   Caller: Patient Reason for Call: Refill Medication Summary of Call: Patient called requesting 90day rx on Allegra and Ambien CR due to a cheaper copay through [facility]. Patient would like to come by and pick up. RX printed and waiting MD signature at triage desk. Initial call taken by: Rock Nephew CMA,  April 28, 2007 10:52 AM  Follow-up for Phone Call        Rx signed by MD and patient notified Follow-up by: Rock Nephew CMA,  April 28, 2007 11:12 AM      Prescriptions: AMBIEN CR 12.5 MG  TBCR (ZOLPIDEM TARTRATE) 1 by mouth at bedtime as needed insomnia  #90 x 4   Entered by:   Rock Nephew CMA   Authorized by:   Tresa Garter MD   Signed by:   Rock Nephew CMA on 04/28/2007   Method used:   Print then Give to Patient   RxID:   [id number] ALLEGRA 180 MG TABS (FEXOFENADINE HCL) once daily prn  #90 x 4   Entered by:   Rock Nephew CMA   Authorized by:   Tresa Garter MD   Signed by:   Rock Nephew CMA on 04/28/2007   Method used:   Print then Give to Patient   RxID:   [id number]

## 2010-02-05 NOTE — Assessment & Plan Note (Signed)
Summary: CPX /NWS  #   Vital Signs:  Patient profile:   60 year old female Height:      63 inches Weight:      145 pounds BMI:     25.78 Temp:     98.4 degrees F oral Pulse rate:   64 / minute Pulse rhythm:   regular Resp:     16 per minute BP sitting:   108 / 74  (left arm) Cuff size:   regular  Vitals Entered By: Lanier Prude, Beverly Gust) (December 07, 2009 9:58 AM) CC: CPX c/o decreased metabolism Is Patient Diabetic? No Comments pt needs Rf on sertraline,alprazolam and cephalexin.    CC:  CPX c/o decreased metabolism.  History of Present Illness: The patient presents for a preventive health examination   Current Medications (verified): 1)  Vitamin D3 1000 Unit  Tabs (Cholecalciferol) .[name].. 1 By Mouth Daily 2)  Alprazolam 0.5 Mg Tabs (Alprazolam) .[name].. 1 or 1/2 By Mouth Two Times A Day As Needed Anxiety 3)  Sertraline Hcl 50 Mg Tabs (Sertraline Hcl) .[name].. 1 By Mouth Qd 4)  Magnesium 200 Mg Tabs (Magnesium) .[name].. 1 By Mouth Once Daily 5)  Fish Oil 1000 Mg Caps (Omega-3 Fatty Acids) .[name].. 1 By Mouth Once Daily 6)  Calcium 500 Mg Tabs (Calcium) .[name].. 1 By Mouth Once Daily 7)  Vitamin B-12 100 Mcg Tabs (Cyanocobalamin) .[name].. 1 By Mouth Once Daily 8)  Fibertab 625 Mg Tabs (Calcium Polycarbophil) .[name].. 1 By Mouth Once Daily 9)  Allertec .[name].. 1 By Mouth Once Daily As Needed 10)  Vit C 500mg  .... 1 By Mouth Once Daily 11)  Pomegranate 250 Mg Caps (Pomegranate (Punica Granatum)) .[name].. 1 By Mouth Once Daily  Allergies (verified): No Known Drug Allergies  Past History:  Past Surgical History: Last updated: 10/19/2008 Appendectomy Hysterectomy, partial s/p breast reduction surgury s/p facelift  Family History: Last updated: 07/28/2007 Family History Hypertension M - pacemaker father with CAD/CABG in his 4's  Social History: Last updated: 07/28/2007 Retired Married Former Smoker Regular exercise-yes 2 biol children - 1 disabled with schizophrenia & 1 step child  Past  Medical History: Anxiety Depression Allergic rhinitis GERD Hyperlipidemia GYN Dr Audie Box [place] n[place] TSH w/sx   Review of Systems       The patient complains of weight gain.  The patient denies anorexia, fever, weight loss, vision loss, decreased hearing, hoarseness, chest pain, syncope, dyspnea on exertion, peripheral edema, prolonged cough, headaches, hemoptysis, abdominal pain, melena, hematochezia, severe indigestion/heartburn, hematuria, incontinence, genital sores, muscle weakness, suspicious skin lesions, transient blindness, difficulty walking, depression, unusual weight change, abnormal bleeding, enlarged lymph nodes, angioedema, and breast masses.         Tired  Physical Exam  General:  Well-developed,well-nourished,in no acute distress; alert,appropriate and cooperative throughout examination Head:  Normocephalic and atraumatic without obvious abnormalities. No apparent alopecia or balding. Eyes:  No corneal or conjunctival inflammation noted. EOMI. P[place]la.  Ears:  External ear exam shows no significant lesions or deformities.  Otoscopic examination reveals clear canals, tympanic membranes are intact bilaterally without bulging, retraction, inflammation or discharge. Hearing is grossly normal bilaterally. Nose:  External nasal examination shows no deformity or inflammation. Nasal mucosa are pink and moist without lesions or exudates. Mouth:  Oral mucosa and oropharynx without lesions or exudates.  Teeth in good repair. Neck:  No deformities, masses, or tenderness noted. Lungs:  Normal respiratory effort, chest expands symmetrically. Lungs are clear to auscultation, no crackles or wheezes. Heart:  Normal  rate and regular rhythm. S1 and S2 normal without gallop, murmur, click, rub or other extra sounds. Abdomen:  Bowel sounds positive,abdomen soft and non-tender without masses, organomegaly or hernias noted. Msk:  No deformity or scoliosis noted of thoracic or lumbar spine.     Pulses:  R and L carotid,radial,femoral,dorsalis pedis and posterior tibial pulses are full and equal bilaterally Extremities:  No clubbing, cyanosis, edema, or deformity noted with normal full range of motion of all joints.   Neurologic:  No cranial nerve deficits noted. Station and gait are normal. Plantar reflexes are down-going bilaterally. DTRs are symmetrical throughout. Sensory, motor and coordinative functions appear intact. Skin:  papular rash arond neck at base Cervical Nodes:  No lymphadenopathy noted Inguinal Nodes:  No significant adenopathy Psych:  Cognition and judgment appear intact. Alert and cooperative with normal attention span and concentration. No apparent delusions, illusions, hallucinations   Impression & Recommendations:  Problem # 1:  PHYSICAL EXAMINATION (ICD-V70.0) Assessment New Health and age related issues were discussed. Available screening tests and vaccinations were discussed as well. Healthy life style including good diet and exercise was discussed.  The labs were reviewed with the patient.  GYN pap/mammo q 1 year Colonosc - she will be recalled when due  Problem # 2:  WEIGHT GAIN, ABNORMAL (ICD-783.1) Assessment: New TSH is up - will correct Poss due to Zoloft  Problem # 3:  HYPERLIPIDEMIA (ICD-272.4) Assessment: Comment Only Diet and wt control  Complete Medication List: 1)  Vitamin D3 1000 Unit Tabs (Cholecalciferol) .[name].. 1 by mouth daily 2)  Alprazolam 0.5 Mg Tabs (Alprazolam) .[name].. 1 or 1/2 by mouth two times a day as needed anxiety 3)  Sertraline Hcl 50 Mg Tabs (Sertraline hcl) .[name].. 1 by mouth qd 4)  Magnesium 200 Mg Tabs (Magnesium) .[name].. 1 by mouth once daily 5)  Fish Oil 1000 Mg Caps (Omega-3 fatty acids) .[name].. 1 by mouth once daily 6)  Calcium 500 Mg Tabs (Calcium) .[name].. 1 by mouth once daily 7)  Vitamin B-12 100 Mcg Tabs (Cyanocobalamin) .[name].. 1 by mouth once daily 8)  Fibertab 625 Mg Tabs (Calcium polycarbophil) .[name].. 1 by mouth once  daily 9)  Allertec  .[name].. 1 by mouth once daily as needed 10)  Vit C 500mg   .... 1 by mouth once daily 11)  Pomegranate 250 Mg Caps (Pomegranate (punica granatum)) .[name].. 1 by mouth once daily 12)  Synthroid 25 Mcg Tabs (Levothyroxine sodium) .[name].. 1 by mouth once daily for thyroid  Patient Instructions: 1)  Please schedule a follow-up appointment in 1 year well w/labs. 2)  Valerian root for anxiety 3)  TSH in 2 months 244.8 Prescriptions: SERTRALINE HCL 50 MG TABS (SERTRALINE HCL) 1 by mouth qd  #30 x 6   Entered and Authorized by:   Tresa Garter MD   Signed by:   Tresa Garter MD on 12/07/2009   Method used:   Electronically to        [facility]  [phone]* (retail)       [address]       [place], [place]  [address]       Ph: [phone] or [phone]       Fax: [phone]   RxID:   [id number] SYNTHROID 25 MCG TABS (LEVOTHYROXINE SODIUM) 1 by mouth once daily for thyroid  #30 x 12   Entered and Authorized by:   Tresa Garter MD   Signed  by:   Tresa Garter MD on 12/07/2009   Method used:   Electronically to        [facility]  [phone]* (retail)       [address]       [place], [place]  [address]       Ph: [phone] or [phone]       Fax: [phone]   RxID:   [id number]    Orders Added: 1)  Est. Patient 40-64 years [[id number]]   Immunization History:  Influenza Immunization History:    Influenza:  historical (10/18/2009)  Tetanus/Td Immunization History:    Tetanus/Td:  historical (10/14/2007)   Immunization History:  Influenza Immunization History:    Influenza:  Historical (10/18/2009)  Tetanus/Td Immunization History:    Tetanus/Td:  Historical (10/14/2007)

## 2010-02-05 NOTE — Letter (Signed)
Summary: [facility] Instructions  [facility]  [address] [place], [place] [address]   Phone: [phone]  Fax: [phone]       Ellen [name]    18-Feb-1950    MRN: [id number]        Procedure Day /Date:  Thursday 05/31/2009     Arrival Time: 1:00 pm      Procedure Time: 2:00 pm     Location of Procedure:                    _x _  [facility] (4th Floor)                        PREPARATION FOR COLONOSCOPY WITH MOVIPREP   Starting 5 days prior to your procedure Saturday 5/21 do not eat nuts, seeds, popcorn, corn, beans, peas,  salads, or any raw vegetables.  Do not take any fiber supplements (e.g. Metamucil, Citrucel, and Benefiber).  THE DAY BEFORE YOUR PROCEDURE         DATE: Wednesday 5/25  1.  Drink clear liquids the entire day-NO SOLID FOOD  2.  Do not drink anything colored red or purple.  Avoid juices with pulp.  No orange juice.  3.  Drink at least 64 oz. (8 glasses) of fluid/clear liquids during the day to prevent dehydration and help the prep work efficiently.  CLEAR LIQUIDS INCLUDE: Water Jello Ice Popsicles Tea (sugar ok, no milk/cream) Powdered fruit flavored drinks Coffee (sugar ok, no milk/cream) Gatorade Juice: apple, white grape, white cranberry  Lemonade Clear bullion, consomm, broth Carbonated beverages (any kind) Strained chicken noodle soup Hard Candy                             4.  In the morning, mix first dose of MoviPrep solution:    Empty 1 Pouch A and 1 Pouch B into the disposable container    Add lukewarm drinking water to the top line of the container. Mix to dissolve    Refrigerate (mixed solution should be used within 24 hrs)  5.  Begin drinking the prep at 5:00 p.m. The MoviPrep container is divided by 4 marks.   Every 15 minutes drink the solution down to the next mark (approximately 8 oz) until the full liter is complete.   6.  Follow completed prep with 16 oz of clear liquid of your choice  (Nothing red or purple).  Continue to drink clear liquids until bedtime.  7.  Before going to bed, mix second dose of MoviPrep solution:    Empty 1 Pouch A and 1 Pouch B into the disposable container    Add lukewarm drinking water to the top line of the container. Mix to dissolve    Refrigerate  THE DAY OF YOUR PROCEDURE      DATE: Thursday 5/26  Beginning at 9:00 a.m. (5 hours before procedure):         1. Every 15 minutes, drink the solution down to the next mark (approx 8 oz) until the full liter is complete.  2. Follow completed prep with 16 oz. of clear liquid of your choice.    3. You may drink clear liquids until 12:00 pm (2 HOURS BEFORE PROCEDURE).   MEDICATION INSTRUCTIONS  Unless otherwise instructed, you should take regular prescription medications with a small sip of water   as early as possible the morning of  your procedure.           OTHER INSTRUCTIONS  You will need a responsible adult at least 62 years of age to accompany you and drive you home.   This person must remain in the waiting room during your procedure.  Wear loose fitting clothing that is easily removed.  Leave jewelry and other valuables at home.  However, you may wish to bring a book to read or  an iPod/MP3 player to listen to music as you wait for your procedure to start.  Remove all body piercing jewelry and leave at home.  Total time from sign-in until discharge is approximately 2-3 hours.  You should go home directly after your procedure and rest.  You can resume normal activities the  day after your procedure.  The day of your procedure you should not:   Drive   Make legal decisions   Operate machinery   Drink alcohol   Return to work  You will receive specific instructions about eating, activities and medications before you leave.    The above instructions have been reviewed and explained to me by   Ezra Sites RN  May 14, 2009 2:08 PM     I fully understand and  can verbalize these instructions _____________________________ Date _________

## 2010-02-05 NOTE — Assessment & Plan Note (Signed)
Summary: PT WENT TO ER TUE-04/06/07-FAINT'G SPELLS-TOLD TO SEE PC MD-$5...   Vital Signs:  Patient Profile:   60 Years Old Female Weight:      146 pounds Temp:     98.1 degrees F oral Pulse rate:   69 / minute BP sitting:   123 / 87  (left arm)  Vitals Entered By: Tora Perches (April 13, 2007 11:31 AM)             Is Patient Diabetic? No     Chief Complaint:  f/u from er.  History of Present Illness: Passed out [place] [address]  on 6/96/2 with freinds -started blacking out, pulled over. Didn't pass out. Went to ER; was weak, no CP . It happened 3 times, every time in the car. The bra was tight. Had a nl EKG, labs nl.    Current Allergies (reviewed today): ! * SHELLFISH  Past Medical History:    Reviewed history from 10/16/2006 and no changes required:       Anxiety       Depression       Allergic rhinitis       GERD  Past Surgical History:    Reviewed history from 10/16/2006 and no changes required:       Appendectomy       Hysterectomy   Family History:    Reviewed history from 10/16/2006 and no changes required:       n/a       Family History Hypertension       M - pacemaker  Social History:    Reviewed history from 10/16/2006 and no changes required:       Retired       Married       Former Smoker       Regular exercise-yes    Review of Systems       The patient complains of syncope.  The patient denies anorexia, fever, weight loss, chest pain, peripheral edema, prolonged cough, hemoptysis, abdominal pain, hematuria, muscle weakness, depression, enlarged lymph nodes, and angioedema.         Swallows air a lot - eats fast   Physical Exam  General:     Well-developed,well-nourished,in no acute distress; alert,appropriate and cooperative throughout examination Head:     Normocephalic and atraumatic without obvious abnormalities. No apparent alopecia or balding. Ears:      External ear exam shows no significant lesions or deformities.  Otoscopic examination reveals clear canals, tympanic membranes are intact bilaterally without bulging, retraction, inflammation or discharge. Hearing is grossly normal bilaterally. Mouth:     Oral mucosa and oropharynx without lesions or exudates.  Teeth in good repair. Neck:     No deformities, masses, or tenderness noted. Lungs:     Normal respiratory effort, chest expands symmetrically. Lungs are clear to auscultation, no crackles or wheezes. Heart:     Normal rate and regular rhythm. S1 and S2 normal without gallop, murmur, click, rub or other extra sounds. Abdomen:     Bowel sounds positive,abdomen soft and non-tender without masses, organomegaly or hernias noted. Genitalia:     Normal introitus for age, no external lesions, no vaginal discharge, mucosa pink and moist, no vaginal or cervical lesions, no vaginal atrophy, no friaility or hemorrhage, normal uterus size and position, no adnexal masses or tenderness Msk:     No deformity or scoliosis noted of thoracic or lumbar spine.   Pulses:     R and L carotid,radial,femoral,dorsalis  pedis and posterior tibial pulses are full and equal bilaterally Extremities:     No clubbing, cyanosis, edema, or deformity noted with normal full range of motion of all joints.   Neurologic:     No cranial nerve deficits noted. Station and gait are normal. Plantar reflexes are down-going bilaterally. DTRs are symmetrical throughout. Sensory, motor and coordinative functions appear intact. Skin:     Intact without suspicious lesions or rashes Psych:     Cognition and judgment appear intact. Alert and cooperative with normal attention span and concentration. No apparent delusions, illusions, hallucinations    Impression & Recommendations:  Problem # 1:  SYNCOPE (ICD-780.2) Assessment: New  3 episodes of near-syncope, all in the car after a meal: poss. vaso-vagal. Card consult. Loosen belt/bras. Eat slow.  Move a seat away from a steering wheel!  No driving now!  Had an ECHO Orders: Cardiology Referral (Cardiology)   Problem # 2:  CYSTITIS, ACUTE, RECURRENT (ICD-595.0) Assessment: Unchanged  The following medications were removed from the medication list:    Zithromax Z-pak 250 Mg Tabs (Azithromycin) .[name]... As dirr.  Her updated medication list for this problem includes:    Keflex 500 Mg Caps (Cephalexin) .[name]... 1 by mouth two times a day as dirr. for utis   Complete Medication List: 1)  Allegra 180 Mg Tabs (Fexofenadine hcl) .... Once daily prn 2)  Premarin 0.625 Mg/gm Crea (Estrogens, conjugated) .... Use as dirrected pv 3)  Keflex 500 Mg Caps (Cephalexin) .[name].. 1 by mouth two times a day as dirr. for utis 4)  Nortriptyline Hcl 10 Mg Caps (Nortriptyline hcl) .[name].. 1 by mouth qhs 5)  Ambien Cr 12.5 Mg Tbcr (Zolpidem tartrate) .[name].. 1 by mouth at bedtime as needed insomnia 6)  Protonix 40 Mg Tbec (Pantoprazole sodium) .[name].. 1 once daily 7)  Vitamin D3 1000 Unit Tabs (Cholecalciferol) .[name].. 1 once daily po   Patient Instructions: 1)  Please schedule a follow-up appointment in 3 months.    Prescriptions: PROTONIX 40 MG  TBEC (PANTOPRAZOLE SODIUM) 1 once daily  #30 x 12   Entered and Authorized by:   Tresa Garter MD   Signed by:   Tresa Garter MD on 04/13/2007   Method used:   Electronically sent to ...       [facility]  [address]  [phone]*       [address]       [place], [place]  [address]       Ph: [phone] or [phone]       Fax: [phone]   RxID:   [id number]  ]

## 2010-02-05 NOTE — Progress Notes (Signed)
Summary: sinus infection   Phone Note Call from Patient Call back at Home Phone [phone] Call back at Work Phone [phone]   Caller: Patient Call For: Dr Posey Rea Summary of Call: Patient called stating that she had a recent sinus xray done by her dentist that showed a very bad sinus infection. Patient stated that she has been treated for this in the past with regular antibiotic. She would like to know if there is a "super antbiiotic" that may help . Please advise  Initial call taken by: Rock Nephew CMA,  February 24, 2007 11:47 AM  Follow-up for Phone Call        Schedule a sinus CT, please Follow-up by: Tresa Garter MD,  February 24, 2007 1:25 PM  New Problems: SINUSITIS, ACUTE (ICD-461.9)   New Problems: SINUSITIS, ACUTE (ICD-461.9)

## 2010-02-05 NOTE — Progress Notes (Signed)
Summary: REQ FOR NEW RX  Phone Note Call from Patient Call back at Work Phone [phone]   Summary of Call: Pt says she is not taking any prescription meds. Pt c/o feeling "down", depressed and no energy. Wants rx to help her sleep and "keep her head from spinning on the same thing over and over".  She wants med to help her feel more balanced. She has tried lexapro but does not like this med. Pt would like med that is "new" on the market, has heard of lumaday and wants to know what Dr Posey Rea thinks. Patient is requesting rx today.  Initial call taken by: Lamar Sprinkles, CMA,  October 31, 2008 9:29 AM  Follow-up for Phone Call        What is Lumaday? Is it Lunesta?Cymbalta? Follow-up by: Tresa Garter MD,  October 31, 2008 11:10 AM  Additional Follow-up for Phone Call Additional follow up Details #1::        left mess to call office back ...................................Marland KitchenLamar Sprinkles, CMA  October 31, 2008 11:52 AM   Patient called back and says it is spelled: Lumiday. She says that she does not want lexapro. She will take whatever Dr Posey Rea suggests. Additional Follow-up by: Lamar Sprinkles, CMA,  October 31, 2008 1:49 PM    Additional Follow-up for Phone Call Additional follow up Details #2::    Try Nortriptyline 10 Follow-up by: Tresa Garter MD,  November 01, 2008 1:03 PM  Additional Follow-up for Phone Call Additional follow up Details #3:: Details for Additional Follow-up Action Taken: left mess to call office back..........................Marland KitchenLamar Sprinkles, CMA  November 02, 2008 2:38 PM    Pt informed, she says he has tried this med in the past but will try again and call office back .....................Marland KitchenLamar Sprinkles, CMA  November 03, 2008 8:40 AM   New/Updated Medications: NORTRIPTYLINE HCL 10 MG CAPS (NORTRIPTYLINE HCL) 1 by mouth qhs Prescriptions: NORTRIPTYLINE HCL 10 MG CAPS (NORTRIPTYLINE HCL) 1 by mouth qhs  #30 x 6    Entered and Authorized by:   Tresa Garter MD   Signed by:   Lamar Sprinkles, CMA on 11/02/2008   Method used:   Electronically to        [facility]  [phone]* (retail)       [address]       [place], [place]  [address]       Ph: [phone] or [phone]       Fax: [phone]   RxID:   [id number]

## 2010-02-05 NOTE — Procedures (Signed)
Summary: Colonoscopy  Patient: Ellen [name] Note: All result statuses are Final unless otherwise noted.  Tests: (1) Colonoscopy (COL)   COL Colonoscopy           DONE     [facility]     [address]     [place], [place]  [address]           COLONOSCOPY PROCEDURE REPORT           PATIENT:  Ellen, [name]  MR#:  [id number]     BIRTHDATE:  1950/12/01, 58 yrs. old  GENDER:  female     ENDOSCOPIST:  Judie Petit T. Russella Dar, MD, [facility]           PROCEDURE DATE:  05/31/2009     PROCEDURE:  Colonoscopy 09323     ASA CLASS:  Class II     INDICATIONS:  1) Routine Risk Screening     MEDICATIONS:   Fentanyl 100 mcg IV, Versed 10 mg IV, Benadryl 25     mg IV     DESCRIPTION OF PROCEDURE:   After the risks benefits and     alternatives of the procedure were thoroughly explained, informed     consent was obtained.  Digital rectal exam was performed and     revealed no abnormalities.   The LB PCF-[id number] endoscope     was introduced through the anus and advanced to the cecum, which     was identified by both the appendix and ileocecal valve, limited     by a tortuous colon.    The quality of the prep was good, using     MoviPrep.  The instrument was then slowly withdrawn as the colon     was fully examined.     <<PROCEDUREIMAGES>>     FINDINGS:  A normal appearing cecum, ileocecal valve, and     appendiceal orifice were identified. The ascending, hepatic     flexure, transverse, splenic flexure, descending colon, and rectum     appeared unremarkable. Mild diverticulosis was found in the     sigmoid colon. Retroflexed views in the rectum revealed no     abnormalities. The time to cecum =  6.5  minutes. The scope was     then withdrawn (time =  11.67  min) from the patient and the     procedure completed.     COMPLICATIONS:  None           ENDOSCOPIC IMPRESSION:     1) Mild diverticulosis in the sigmoid colon     RECOMMENDATIONS:     1) High fiber diet with liberal fluid intake.   2) Continue current colorectal screening recommendations for     "routine risk" patients with a repeat colonoscopy in 10 years.           Venita Lick. Russella Dar, MD, [facility]           CC: Linda Hedges. Plotnikov, MD           n.     Rosalie DoctorJudie Petit T. Clotilde Loth at 05/31/2009 02:42 PM           Teodoro Spray, [id number]  Note: An exclamation mark (!) indicates a result that was not dispersed into the flowsheet. Document Creation Date: 05/31/2009 2:43 PM _______________________________________________________________________  (1) Order result status: Final Collection or observation date-time: 05/31/2009 14:37 Requested date-time:  Receipt date-time:  Reported date-time:  Referring Physician:   Ordering Physician: Claudette Head [phone]) Specimen  Source:  Source: [employer] Order Number: [id number] Lab site:   Appended Document: Colonoscopy    Clinical Lists Changes  Observations: Added new observation of COLONNXTDUE: 05/2019 (05/31/2009 16:14)

## 2010-02-05 NOTE — Progress Notes (Signed)
Summary: Rf Alprazolam  Phone Note Refill Request Message from:  Pharmacy  Refills Requested: Medication #1:  ALPRAZOLAM 0.5 MG TABS 1 or 1/2 by mouth two times a day as needed anxiety   Dosage confirmed as above?Dosage Confirmed   Supply Requested: 60   Last Refilled: 09/12/2009  Method Requested: Telephone to Pharmacy Next Appointment Scheduled: 12/07/2009 Initial call taken by: Lanier Prude, [facility]),  November 07, 2009 9:43 AM  Follow-up for Phone Call        ok 3 ref Follow-up by: Tresa Garter MD,  November 07, 2009 1:20 PM  Additional Follow-up for Phone Call Additional follow up Details #1::        Rx called to pharmacy Additional Follow-up by: Lanier Prude, [facility]),  November 07, 2009 4:25 PM    Prescriptions: ALPRAZOLAM 0.5 MG TABS (ALPRAZOLAM) 1 or 1/2 by mouth two times a day as needed anxiety  #60 x 3   Entered by:   Lanier Prude, [facility])   Authorized by:   Tresa Garter MD   Signed by:   Lanier Prude, CMA(AAMA) on 11/07/2009   Method used:   Telephoned to ...       [facility]  [address]  [phone]* (retail)       [address]       [place], [place]  [address]       Ph: [phone] or [phone]       Fax: [phone]   RxID:   [id number]

## 2010-02-05 NOTE — Progress Notes (Signed)
Summary: STILL SICK  Phone Note Call from Patient Call back at Home Phone [phone]   Summary of Call: Pt continues to have sinus problems, ear still feels swollen and very painful. Has been on 3 different antibiotics & ear drops.  Takes allegra, asprin, dayquil & nyquil. What else should she do?  Initial call taken by: Lamar Sprinkles,  August 30, 2007 2:17 PM  Follow-up for Phone Call        OV tomorrow Follow-up by: Tresa Garter MD,  August 30, 2007 2:44 PM  Additional Follow-up for Phone Call Additional follow up Details #1::        left mess to call office back & schedule apt ...........Marland KitchenLamar Sprinkles  August 30, 2007 3:00 PM     Additional Follow-up for Phone Call Additional follow up Details #2::    Pt has apt tomorrow Follow-up by: Lamar Sprinkles,  August 30, 2007 4:50 PM

## 2010-02-05 NOTE — Progress Notes (Signed)
Summary: REQ FOR RX - Depression/stress  Phone Note Call from Patient Call back at Home Phone [phone]   Summary of Call: Patient has been dealing with care of step father, in and out of hospital care for cancer x 4 months. They are calling in hospice now. She is req rx for something to help with stress, anxiety and mood swings. She thinks zoloft may be a good idea, she needs something more than xanax. She says she is off all prescription medications.  Initial call taken by: Lamar Sprinkles, CMA,  August 16, 2009 3:00 PM  Follow-up for Phone Call        Use Nortryptiline q hs Xanax as needed OV in 2-3 wks if problems Follow-up by: Tresa Garter MD,  August 16, 2009 4:30 PM  Additional Follow-up for Phone Call Additional follow up Details #1::        left mess to call office back...................Marland KitchenLamar Sprinkles, CMA  August 16, 2009 4:38 PM   Spoke w/patient, she says that she has tried nortriptyline in the past w/no help. Also does not want to take xanax or other 'addictive" meds if possible.  Additional Follow-up by: Lamar Sprinkles, CMA,  August 16, 2009 4:43 PM    Additional Follow-up for Phone Call Additional follow up Details #2::    Noted ok Zoloft Follow-up by: Tresa Garter MD,  August 16, 2009 6:57 PM  Additional Follow-up for Phone Call Additional follow up Details #3:: Details for Additional Follow-up Action Taken: Pt informed .......................Marland KitchenLamar Sprinkles, CMA  August 17, 2009 10:53 AM   New/Updated Medications: NORTRIPTYLINE HCL 10 MG CAPS (NORTRIPTYLINE HCL) 1 by mouth qhs ALPRAZOLAM 0.5 MG TABS (ALPRAZOLAM) 1 or 1/2 by mouth two times a day as needed anxiety SERTRALINE HCL 50 MG TABS (SERTRALINE HCL) 1 by mouth qd Prescriptions: ALPRAZOLAM 0.5 MG TABS (ALPRAZOLAM) 1 or 1/2 by mouth two times a day as needed anxiety  #60 x 1   Entered by:   Lamar Sprinkles, CMA   Authorized by:   Tresa Garter MD   Signed by:   Lamar Sprinkles, CMA on  08/17/2009   Method used:   Telephoned to ...       [facility]  [address]  [phone]* (retail)       [address]       [place], [place]  [address]       Ph: [phone] or [phone]       Fax: [phone]   RxID:   [id number] SERTRALINE HCL 50 MG TABS (SERTRALINE HCL) 1 by mouth qd  #30 x 6   Entered by:   Lamar Sprinkles, CMA   Authorized by:   Tresa Garter MD   Signed by:   Lamar Sprinkles, CMA on 08/17/2009   Method used:   Telephoned to ...       [facility]  [address]  [phone]* (retail)       [address]       [place], [place]  [address]       Ph: [phone] or [phone]       Fax: [phone]   RxID:   [id number] SERTRALINE HCL 50 MG TABS (SERTRALINE HCL) 1 by mouth qd  #30 x 6   Entered and Authorized by:   Tresa Garter MD   Signed by:   Tresa Garter MD on 08/16/2009   Method used:  Print then Give to Patient   RxID:   [id number] NORTRIPTYLINE HCL 10 MG CAPS (NORTRIPTYLINE HCL) 1 by mouth qhs  #30 x 6   Entered and Authorized by:   Tresa Garter MD   Signed by:   Tresa Garter MD on 08/16/2009   Method used:   Print then Give to Patient   RxID:   [id number] ALPRAZOLAM 0.5 MG TABS (ALPRAZOLAM) 1 or 1/2 by mouth two times a day as needed anxiety  #60 x 1   Entered and Authorized by:   Tresa Garter MD   Signed by:   Tresa Garter MD on 08/16/2009   Method used:   Print then Give to Patient   RxID:   [id number]

## 2010-02-05 NOTE — Assessment & Plan Note (Signed)
   Vital Signs:  Patient Profile:   60 Years Old Female Weight:      147 pounds Pulse rate:   113 / minute BP sitting:   113 / 72                 Chief Complaint:  Preventive Care.  Current Allergies: ! * SHELLFISH  Past Medical History:    Anxiety    Depression    Allergic rhinitis  Past Surgical History:    Appendectomy    Hysterectomy   Family History:    n/a    Family History Hypertension  Social History:    Retired    Married    Former Smoker    Regular exercise-yes   Risk Factors:  Tobacco use:  quit Exercise:  yes    Physical Exam  General:     Well-developed,well-nourished,in no acute distress; alert,appropriate and cooperative throughout examination Eyes:     No corneal or conjunctival inflammation noted. EOMI. Perrla. Funduscopic exam benign, without hemorrhages, exudates or papilledema. Vision grossly normal. Ears:     External ear exam shows no significant lesions or deformities.  Otoscopic examination reveals clear canals, tympanic membranes are intact bilaterally without bulging, retraction, inflammation or discharge. Hearing is grossly normal bilaterally. Nose:     External nasal examination shows no deformity or inflammation. Nasal mucosa are pink and moist without lesions or exudates. Mouth:     Oral mucosa and oropharynx without lesions or exudates.  Teeth in good repair. Neck:     No deformities, masses, or tenderness noted. Lungs:     Normal respiratory effort, chest expands symmetrically. Lungs are clear to auscultation, no crackles or wheezes. Heart:     Normal rate and regular rhythm. S1 and S2 normal without gallop, murmur, click, rub or other extra sounds. Abdomen:     Bowel sounds positive,abdomen soft and non-tender without masses, organomegaly or hernias noted. Msk:     No deformity or scoliosis noted of thoracic or lumbar spine.   Pulses:      R and L carotid,radial,femoral,dorsalis pedis and posterior tibial pulses are full and equal bilaterally Neurologic:     No cranial nerve deficits noted. Station and gait are normal. Plantar reflexes are down-going bilaterally. DTRs are symmetrical throughout. Sensory, motor and coordinative functions appear intact. Skin:     Intact without suspicious lesions or rashes Psych:     Cognition and judgment appear intact. Alert and cooperative with normal attention span and concentration. No apparent delusions, illusions, hallucinations    Impression & Recommendations:  Problem # 1:  Preventive Health Care (ICD-V70.0) Assessment: Comment Only Doing well. Gyn w/ Dr Katherina Right q 1 year. Colon 5 y ago. Mammogr q 1 y. Tdap  Problem # 2:  CYSTITIS NEC (ICD-595.89) Assessment: Improved Cipro 250mg  two times a day  as needed 4 d  Problem # 3:  INSOMNIA-SLEEP DISORDER-UNSPEC (ICD-307.40) Assessment: Unchanged Lunesta 3 mg prn  Problem # 4:  ALLERGIC RHINITIS (ICD-477.9) Assessment: Unchanged  Her updated medication list for this problem includes:    Allegra 180 Mg Tabs (Fexofenadine hcl) ..... Once daily prn   Complete Medication List: 1)  Allegra 180 Mg Tabs (Fexofenadine hcl) .... Once daily prn 2)  Lunesta 3 Mg Tabs (Eszopiclone) .... At bedtime prn 3)  Cipro 250 Mg Tabs (Ciprofloxacin hcl) .... Two times a day x 4 d prn   Patient Instructions: 1)  Please schedule a follow-up appointment in 1 year.    ]

## 2010-02-05 NOTE — Progress Notes (Signed)
Summary: antibiotic  Phone Note Call from Patient Call back at Home Phone [phone] Call back at Work Phone [phone]   Caller: Patient Summary of Call: Patient called c/o sinus infection,sore throat and ear. SHe is requesting an antibiotic, however she has appt with Dr Jonny Ruiz 07/28/07 at 11:15 Initial call taken by: Rock Nephew CMA,  July 27, 2007 4:16 PM

## 2010-02-05 NOTE — Progress Notes (Signed)
Summary: question  Phone Note Call from Patient Call back at Home Phone [phone] Call back at Work Phone [phone]   Summary of Call: Patient left message on triage that she and her husband will need to get new insurance and would like to know what company you recommend. Please advise.  *Also patient stopped all prescription meds. Initial call taken by: Lucious Groves,  March 15, 2009 2:34 PM  Follow-up for Phone Call        Noted re meds. I don't know.I have  [employer] -it is average... Follow-up by: Tresa Garter MD,  March 18, 2009 10:35 PM  Additional Follow-up for Phone Call Additional follow up Details #1::        Left vm for pt Additional Follow-up by: Lamar Sprinkles, CMA,  March 20, 2009 10:47 AM

## 2010-02-05 NOTE — Miscellaneous (Signed)
Summary: [facility]  Clinical Lists Changes  Medications: Added new medication of MOVIPREP 100 GM  SOLR (PEG-KCL-NACL-NASULF-NA ASC-C) As per prep instructions. - Signed Rx of MOVIPREP 100 GM  SOLR (PEG-KCL-NACL-NASULF-NA ASC-C) As per prep instructions.;  #1 x 0;  Signed;  Entered by: Ezra Sites RN;  Authorized by: Meryl Dare MD [facility] [place] [facility];  Method used: Electronically to [facility]  [phone]*, [address], [place], [place], [place]  [address], Ph: [phone] or [phone], Fax: [phone] Allergies: Removed allergy or adverse reaction of * SHELLFISH Observations: Added new observation of NKA: T (05/14/2009 13:38)    Prescriptions: MOVIPREP 100 GM  SOLR (PEG-KCL-NACL-NASULF-NA ASC-C) As per prep instructions.  #1 x 0   Entered by:   Ezra Sites RN   Authorized by:   Meryl Dare MD [facility]   Signed by:   Ezra Sites RN on 05/14/2009   Method used:   Electronically to        [facility]  [phone]* (retail)       [address]       [place], [place]  [address]       Ph: [phone] or [phone]       Fax: [phone]   RxID:   [id number]

## 2010-02-05 NOTE — Assessment & Plan Note (Signed)
Summary: right ear pain/still bother her frm last visit/$50/pn   Vital Signs:  Patient Profile:   60 Years Old Female Weight:      143 pounds Temp:     96.9 degrees F oral Pulse rate:   60 / minute BP sitting:   114 / 70  (left arm) Cuff size:   regular  Vitals Entered By: Jerilynn Mages (August 25, 2007 11:14 AM)                 Chief Complaint:  R ear pain/still bothering her from last visit.  History of Present Illness: son with shizoaffective disorder that she has to help dialy, husband going to [employer] next wl for microdissection for the trigeminal neuralaiga, mother currently hosp'd, and duaghter who is very damanding of her time - so very stressed but also with marked increase to right ear with some d/c and fever    Updated Prior Medication List: ALLEGRA 180 MG TABS (FEXOFENADINE HCL) once daily prn PREMARIN 0.625 MG/GM  CREA (ESTROGENS, CONJUGATED) use as dirrected PV ALPRAZOLAM 1 MG  TABS (ALPRAZOLAM) 1/2 - 1 by mouth qam and 1-2 by mouth at bedtime as needed PROTONIX 40 MG  TBEC (PANTOPRAZOLE SODIUM) 1 once daily VITAMIN D3 1000 UNIT  TABS (CHOLECALCIFEROL) 1 once daily po FLUTICASONE PROPIONATE 50 MCG/ACT  SUSP (FLUTICASONE PROPIONATE) 2 spray/side once daily CORTISPORIN 5-10000-1  SUSP (NEOMYCIN-POLYMYXIN-HC) 2 gtts every 6 hrs for 10 days CIPROFLOXACIN HCL 500 MG  TABS (CIPROFLOXACIN HCL) 1po two times a day  Current Allergies (reviewed today): ! * SHELLFISH  Past Medical History:    Reviewed history from 07/28/2007 and no changes required:       Anxiety       Depression       Allergic rhinitis       GERD       Hyperlipidemia  Past Surgical History:    Reviewed history from 07/28/2007 and no changes required:       Appendectomy       Hysterectomy       s/p breast reduction surgury       s/p facelift   Social History:    Reviewed history from 07/28/2007 and no changes required:       Retired       Married       Former Smoker        Regular exercise-yes       2 biol children - 1 disabled with schizophrenia & 1 step child    Review of Systems       all otherwise negative    Physical Exam  General:     alert and overweight-appearing.   Head:     Normocephalic and atraumatic without obvious abnormalities. No apparent alopecia or balding. Eyes:     No corneal or conjunctival inflammation noted. EOMI. Perrla.  Ears:     right ext canal with 2+ erythema, sweling and slight d/c, right ext canal without this - appears normal Nose:     External nasal examination shows no deformity or inflammation. Nasal mucosa are pink and moist without lesions or exudates. Mouth:     Oral mucosa and oropharynx without lesions or exudates.  Teeth in good repair. Neck:     No deformities, masses, or tenderness noted. Lungs:     Normal respiratory effort, chest expands symmetrically. Lungs are clear to auscultation, no crackles or wheezes. Heart:     Normal rate and regular rhythm. S1 and S2  normal without gallop, murmur, click, rub or other extra sounds. Extremities:     no edema, no ulcers     Impression & Recommendations:  Problem # 1:  OTITIS EXTERNA, ACUTE (ICD-380.12) treat as above, f/u any worsening signs or symptoms - with cipro by mouth course and ear gtts  Problem # 2:  ANXIETY (ICD-300.00)  The following medications were removed from the medication list:    Nortriptyline Hcl 10 Mg Caps (Nortriptyline hcl) .[name]... 1 by mouth qhs  Her updated medication list for this problem includes:    Alprazolam 1 Mg Tabs (Alprazolam) .[name]... 1/2 - 1 by mouth qam and 1-2 by mouth at bedtime as needed treat as above, f/u any worsening signs or symptoms   Complete Medication List: 1)  Allegra 180 Mg Tabs (Fexofenadine hcl) .... Once daily prn 2)  Premarin 0.625 Mg/gm Crea (Estrogens, conjugated) .... Use as dirrected pv 3)  Alprazolam 1 Mg Tabs (Alprazolam) .... 1/2 - 1 by mouth qam and 1-2 by mouth at bedtime as needed  4)  Protonix 40 Mg Tbec (Pantoprazole sodium) .[name].. 1 once daily 5)  Vitamin D3 1000 Unit Tabs (Cholecalciferol) .[name].. 1 once daily po 6)  Fluticasone Propionate 50 Mcg/act Susp (Fluticasone propionate) .... 2 spray/side once daily 7)  Cortisporin 5-10000-1 Susp (Neomycin-polymyxin-hc) .... 2 gtts every 6 hrs for 10 days 8)  Ciprofloxacin Hcl 500 Mg Tabs (Ciprofloxacin hcl) .[name].. 1po two times a day   Patient Instructions: 1)  stop the ambien cr 2)  Please take all new medications as prescribed 3)  Continue all medications that you may have been taking previously 4)  Please schedule a follow-up appointment as needed.   Prescriptions: ALPRAZOLAM 1 MG  TABS (ALPRAZOLAM) 1/2 - 1 by mouth qam and 1-2 by mouth at bedtime as needed  #90 x 2   Entered and Authorized by:   Corwin Levins MD   Signed by:   Corwin Levins MD on 08/25/2007   Method used:   Print then Give to Patient   RxID:   [id number] CIPROFLOXACIN HCL 500 MG  TABS (CIPROFLOXACIN HCL) 1po two times a day  #20 x 0   Entered and Authorized by:   Corwin Levins MD   Signed by:   Corwin Levins MD on 08/25/2007   Method used:   Print then Give to Patient   RxID:   [id number] CORTISPORIN 5-10000-1  SUSP (NEOMYCIN-POLYMYXIN-HC) 2 gtts every 6 hrs for 10 days  #1bottle x 1   Entered and Authorized by:   Corwin Levins MD   Signed by:   Corwin Levins MD on 08/25/2007   Method used:   Print then Give to Patient   RxID:   [id number]  ]

## 2010-02-05 NOTE — Assessment & Plan Note (Signed)
Summary: shingles/bcbs, pt states insurance will pay   Nurse Visit    Prior Medications: ALLEGRA 180 MG TABS (FEXOFENADINE HCL) once daily prn PREMARIN 0.625 MG/GM  CREA (ESTROGENS, CONJUGATED) use as dirrected PV CLARITHROMYCIN 500 MG  TABS (CLARITHROMYCIN) 1 by mouth two times a day NORTRIPTYLINE HCL 10 MG  CAPS (NORTRIPTYLINE HCL) 1 by mouth qhs AMBIEN CR 12.5 MG  TBCR (ZOLPIDEM TARTRATE) 1 by mouth at bedtime as needed insomnia PROTONIX 40 MG  TBEC (PANTOPRAZOLE SODIUM) 1 once daily VITAMIN D3 1000 UNIT  TABS (CHOLECALCIFEROL) 1 once daily po FLUTICASONE PROPIONATE 50 MCG/ACT  SUSP (FLUTICASONE PROPIONATE) 2 spray/side once daily Current Allergies: ! * SHELLFISH   Zostavax # 1    Vaccine Type: Zostavax    Site: left deltoid    Mfr: Merck    Dose: 0.5 ml    Route: [place]    Given by: Tora Perches    Exp. Date: 10/22/2008    Lot #: [id number]    VIS given: 10/18/04 given August 09, 2007.   Orders Added: 1)  Zoster (Shingles) Vaccine Live [90736] 2)  Admin 1st Vaccine [email or web address]    ]

## 2010-02-12 ENCOUNTER — Other Ambulatory Visit: Payer: Self-pay

## 2010-02-26 ENCOUNTER — Encounter: Payer: Self-pay | Admitting: Internal Medicine

## 2010-02-26 LAB — HM MAMMOGRAPHY: HM Mammogram: NORMAL

## 2010-02-28 ENCOUNTER — Other Ambulatory Visit (HOSPITAL_COMMUNITY)
Admission: RE | Admit: 2010-02-28 | Discharge: 2010-02-28 | Disposition: A | Discharge status: Home / Self Care | Payer: BC Managed Care – PPO | Source: Ambulatory Visit | Attending: Gynecology | Admitting: Gynecology

## 2010-02-28 ENCOUNTER — Encounter (INDEPENDENT_AMBULATORY_CARE_PROVIDER_SITE_OTHER): Payer: BC Managed Care – PPO | Admitting: Gynecology

## 2010-02-28 ENCOUNTER — Other Ambulatory Visit: Payer: Self-pay | Admitting: Gynecology

## 2010-02-28 DIAGNOSIS — Z13820 Encounter for screening for osteoporosis: Secondary | ICD-10-CM

## 2010-02-28 DIAGNOSIS — B3731 Acute candidiasis of vulva and vagina: Secondary | ICD-10-CM

## 2010-02-28 DIAGNOSIS — B373 Candidiasis of vulva and vagina: Secondary | ICD-10-CM

## 2010-02-28 DIAGNOSIS — Z124 Encounter for screening for malignant neoplasm of cervix: Secondary | ICD-10-CM | POA: Insufficient documentation

## 2010-02-28 DIAGNOSIS — Z01419 Encounter for gynecological examination (general) (routine) without abnormal findings: Secondary | ICD-10-CM

## 2010-02-28 DIAGNOSIS — R82998 Other abnormal findings in urine: Secondary | ICD-10-CM

## 2010-03-13 ENCOUNTER — Encounter: Payer: Self-pay | Admitting: Internal Medicine

## 2010-03-19 NOTE — Miscellaneous (Signed)
Summary: mammogram 2012  Clinical Lists Changes  Observations: Added new observation of MAMMOGRAM: normal (02/26/2010 9:15)      Preventive Care Screening  Mammogram:    Date:  02/26/2010    Results:  normal 

## 2010-05-21 NOTE — Op Note (Signed)
NAMEKRYSTIANNA, Ellen [name]                 ACCOUNT NO.:  [id number]   MEDICAL RECORD NO.:  [id number]          PATIENT TYPE:  INP   LOCATION:  0098                         FACILITY[facility]  [facility]   PHYSICIAN:  Angelia Mould. Derrell Lolling, M.D.DATE OF BIRTH:  03-04-1950   DATE OF PROCEDURE:  06/05/2006  DATE OF DISCHARGE:                               OPERATIVE REPORT   PREOPERATIVE DIAGNOSIS:  Acute appendicitis.   POSTOPERATIVE DIAGNOSIS:  Acute appendicitis.   OPERATION PERFORMED:  Laparoscopic appendectomy.   SURGEON:  Claud Kelp, MD   OPERATIVE INDICATIONS:  This is a 60 year old white female who presented  to Dr. Posey Rea today and was then sent for CT scan to evaluate  abdominal pain.  Dr. Francene Boyers called me and told me that she had  acute appendicitis and she was brought to [facility].  I  evaluated her in the emergency room and felt that her history and  physical findings were consistent with acute appendicitis and she is  brought to the operating room emergently.   OPERATIVE FINDINGS:  The appendix was acutely inflamed, but it had not  obviously ruptured.  It extended down into the pelvis and there were a  fair amount of adhesions that required slow dissection, but ultimately  we were able to transect the appendix at its base.  The terminal ileum,  cecum, small bowel and liver all looked normal.   OPERATIVE TECHNIQUE:  Following the induction of general endotracheal  anesthesia, the patient was identified as correct patient and correct  procedure.  A Foley catheter was inserted.  The abdomen was prepped and  draped in a sterile fashion.  Intravenous antibiotics were given prior  to the incision.  0.5% Marcaine with epinephrine was used as a local  infiltration anesthetic.   An 11-mm OptiView port was placed in the left rectus muscle above the  umbilicus without any difficulty.  Pneumoperitoneum was created.  Video  camera was inserted with visualization and findings as  described above.  A 5-mm trocar was placed in the left rectus sheath below the umbilicus  and a 12-mm trocar was placed in the left suprapubic area through the  previous Pfannenstiel incision.  The patient was positioned.  We  mobilized the cecum by dividing some of its lateral peritoneal  attachments.  We identified the appendix and traced it down into the  pelvis, where we finally identified the tip and lifted it up.  We then  used some blunt and some sharp dissection to mobilize it up.  The  harmonic scalpel was used to divide some of the lateral peritoneal  attachments and to divide the appendiceal mesentery.  We did this in  several small steps because of the inflammation.  Ultimately, we had the  appendix completely exposed and could clearly see its insertion into the  cecum.  An Endo-GIA stapling device was inserted and placed transversely  across the base of the appendix at the level of the cecum.  The stapler  was closed, held in place for 30 seconds, then fired and removed.  The  appendix was placed  in the specimen bag and removed through the  suprapubic port.  The pelvis and the operative field were irrigated with  saline.  There was no bleeding.  The staple line looked excellent.  The  trocars were removed under direct vision.  There was no bleeding from  the trocar sites.  The pneumoperitoneum was released.  Fascia at the  suprapubic area was closed with 0 Vicryl sutures.  The skin incisions were closed with subcuticular sutures of 4-0 Monocryl  and Steri-Strips.  Clean bandages were placed and the patient taken to  recovery room in stable condition.  Estimated blood loss was about 10-15  mL.  Complications -- none.  Sponge, needle and instrument counts were  correct.      Angelia Mould. Derrell Lolling, M.D.  Electronically Signed     HMI/MEDQ  D:  06/05/2006  T:  06/06/2006  Job:  [id number]

## 2010-05-21 NOTE — H&P (Signed)
Ellen [name], Ellen [name]                 ACCOUNT NO.:  [id number]   MEDICAL RECORD NO.:  [id number]          PATIENT TYPE:  INP   LOCATION:  0098                         FACILITY[facility]   PHYSICIAN:  Angelia Mould. Derrell Lolling, M.D.DATE OF BIRTH:  07/28/1950   DATE OF ADMISSION:  06/05/2006  DATE OF DISCHARGE:                              HISTORY & PHYSICAL   CHIEF COMPLAINT:  Abdominal pain.   HISTORY OF PRESENT ILLNESS:  This is a 60 year old white female who has  abdominal pain.  She states that 2 weeks ago she had left lower quadrant  pain and completely resolved.  For the past 3 days, she has had right  lower quadrant pain which has been progressive and quite severe today  and associated with nausea, but no vomiting.  She has had normal bowel  movements.  No prior episodes.  She saw Dr. Trinna Post Plotnikov.  A CT scan  was obtained which shows acute appendicitis, but no evidence of abscess  or obstruction.  She was sent to [facility] for my evaluation.  She is  being admitted for urgent surgery.   PAST HISTORY:  1. Total abdominal hysterectomy.  2. C-sections x2.  3. Breast reduction, bilateral.  4. No medical problems.   MEDICATIONS:  Nanine Means, Allegra.   DRUG ALLERGIES:  None known.   SOCIAL HISTORY:  She is married and has 3 children.  She is a housewife.  Her husband works at [employer] in [employer] system.  She  drinks 2 glasses of wine every evening, denies tobacco.   FAMILY HISTORY:  Mother living and well and has degenerative joint  disease.  Father deceased of encephalitis.   REVIEW OF SYSTEMS:  Fifteen-system review of systems is performed and is  noncontributory, except as described above.   PHYSICAL EXAMINATION:  GENERAL:  A pleasant middle-aged woman in mild to  moderate distress.  VITAL SIGNS:  Temperature 101, pulse 82, respirations 16, blood pressure  114/67.  EYES:  Sclerae clear.  Extraocular movements intact.  Ears, nose, mouth  and throat,  nose, lips, tongue and oropharynx are without gross lesions.  NECK:  Supple and nontender.  Thyroid not enlarged.  No adenopathy or  jugular distention.  LUNGS:  Clear to auscultation.  No chest wall tenderness.  HEART:  Regular rate and rhythm.  No murmur.  Radial and femoral pulses  are palpable.  BREASTS:  Inframammary scars are well-healed bilaterally.  No other skin  change.  ABDOMEN:  Very tender with involuntary guarding in the right lower  quadrant and suprapubic area.  Hypoactive bowel sounds.  Not obviously  distended, fairly soft elsewhere.  No mass.  No hernia.  Well-healed  Pfannenstiel scar.  EXTREMITIES:  She moves all 4 extremities well without pain or  deformity.  NEUROLOGIC:  No gross motor or sensory deficits.   ADMISSION DATA:  CT scan shows a thickened inflamed appendix, but no  obstruction or abscess, or signs of perforation.   Lab work shows a hemoglobin of 13.9, white blood cell count of 16,000,  basically normal urinalysis and basically normal  complete metabolic  panel.   ASSESSMENT:  Acute appendicitis.   PLAN:  The patient will be admitted, started on intravenous antibiotics  and taken to the operating room for appendectomy.   I have discussed the indication and details of surgery with the patient  and her husband.  Risks and complications have been outlined, including,  but not limited to, bleeding, infection, conversion to open laparotomy,  injury to adjacent organs such as the intestine or major vascular  structures or ureter with major reconstructive surgery, wound problems  such as infection or hernia, cardiac, pulmonary and thromboembolic  problems.  She seems to understand these issues well.  At this time, all  of her questions are answered.  She is in full agreement with this plan.      Angelia Mould. Derrell Lolling, M.D.  Electronically Signed     HMI/MEDQ  D:  06/05/2006  T:  06/06/2006  Job:  [id number]

## 2010-05-24 NOTE — Procedures (Signed)
[facility]  Patient:    Ellen [name], Ellen [name]                          MRN: [id number] Proc. Date: 09/04/99 Attending:  Vania Rea. Jarold Motto, M.D. [facility] CC:         Sonda Primes, M.D. [facility]   Procedure Report  PROCEDURE:  Outpatient colonoscopy.  INDICATIONS FOR PROCEDURE:  Ellen [name] is a 60 year old white female with chronic functional constipation and recurrent abdominal pain. It was that colonoscopy was indicated to complete her workup. The risks and benefits of this procedure were explained in detail, and she agreed to proceed as planned. Preoperative cardiopulmonary and mental status exams were unremarkable.  COLONOSCOPY REPORT: Throughout this procedure, the patient was on pulse oximetry and cardiac monitorization. She tolerated this procedure well receiving supplemental low flow oxygen by nasal cannula throughout the procedure. She was anesthetized with 75 mcg of IV fentanyl and 6.5 mg of IV Versed. Inspection of the rectum was unremarkable as was a rectal exam. Her rectum was intubated using the Olympus adult video colonoscope. This was advanced through a somewhat thickened sigmoid colon area with numerous diverticula into the cecum. The base of the cecum including the ileocecal valve appeared normal. The colonoscope was then slowly withdrawn throughout the length of the colon which otherwise was free of any mucosal polypoid lesion including retroflexed view of the rectum. The air was withdrawn as best as possible in the lower GI tract. She returned in stable condition to the recovery room for observation.  ASSESSMENT: 1. Constipation and predominant irritable bowel syndrome. 2. Moderately severe sigmoid colon diverticulosis.  RECOMMENDATIONS: 1. High fiber diet, daily Citrucel, and liberal p.o. fluids. 2. Continue stool softeners as needed. 3. P.R.N. follow-up as needed. DD:  09/04/99 TD:  09/04/99 Job: [id number]

## 2010-05-24 NOTE — Procedures (Signed)
[facility]  Patient:    Ellen [name], Ellen [name]                          MRN: [id number] Proc. Date: 09/04/99 Attending:  Vania Rea. Jarold Motto, M.D. [facility]                           Procedure Report  PROCEDURE:  Colonoscopy.  SURGEON:  Vania Rea. Jarold Motto, M.D. DD:  09/04/99 TD:  09/04/99 Job: [id number]

## 2010-05-24 NOTE — Assessment & Plan Note (Signed)
[facility]                                 ON-CALL NOTE   Ellen [name], Ellen [name]                          MRN:          [id number]  DATE:07/19/2006                            DOB:          1950/06/19    PRIMARY CARE PHYSICIAN:  Dr. Posey Rea.   The patient calls in this morning stating that she was stung by a bee  yesterday while she was on a bike ride. The sting occurred on the right  upper eyelid. She states that she took Benadryl yesterday and this  morning the swelling has improved dramatically, but now she has a rash  in the lower extremity. She is concerned about an allergic reaction. She  denies any shortness of breath or dyspnea on exertion. She otherwise  feels well. I advised the patient to go to a local urgent care. She  reports that there is one on [address] nearby her home. She states  that she will go there this morning.     Leanne Chang, M.D.  Electronically Signed    LA/MedQ  DD: 07/19/2006  DT: 07/20/2006  Job #: [id number]

## 2010-07-11 ENCOUNTER — Other Ambulatory Visit: Payer: Self-pay | Admitting: Internal Medicine

## 2010-07-12 ENCOUNTER — Telehealth: Payer: Self-pay | Admitting: *Deleted

## 2010-07-12 NOTE — Telephone Encounter (Signed)
Ok to Rf? 

## 2010-07-12 NOTE — Telephone Encounter (Signed)
rec rf req for xanax 0.5mg   1/2-1 po bid prn. # 60. Last filled 02-08-10. Ok To Rf?

## 2010-07-12 NOTE — Telephone Encounter (Signed)
OK to fill this prescription with additional refills x5 Thank you!  

## 2010-07-12 NOTE — Telephone Encounter (Signed)
Ok X 5 per AVP. 

## 2010-07-15 MED ORDER — ALPRAZOLAM 0.5 MG PO TABS: 0.5000 mg | ORAL_TABLET | Freq: Two times a day (BID) | ORAL | Status: DC | PRN

## 2010-07-24 ENCOUNTER — Ambulatory Visit (INDEPENDENT_AMBULATORY_CARE_PROVIDER_SITE_OTHER): Payer: BC Managed Care – PPO | Admitting: Endocrinology

## 2010-07-24 ENCOUNTER — Other Ambulatory Visit (INDEPENDENT_AMBULATORY_CARE_PROVIDER_SITE_OTHER): Payer: BC Managed Care – PPO

## 2010-07-24 ENCOUNTER — Encounter: Payer: Self-pay | Admitting: Endocrinology

## 2010-07-24 VITALS — BP 102/72 | HR 70 | Temp 97.8°F | Ht 63.0 in | Wt 158.0 lb

## 2010-07-24 DIAGNOSIS — E079 Disorder of thyroid, unspecified: Secondary | ICD-10-CM

## 2010-07-24 LAB — TSH: TSH: 3.01 u[IU]/mL (ref 0.35–5.50)

## 2010-07-24 LAB — T4, FREE: Free T4: 0.85 ng/dL (ref 0.60–1.60)

## 2010-07-24 LAB — T3, FREE: T3, Free: 2.7 pg/mL (ref 2.3–4.2)

## 2010-07-24 NOTE — Patient Instructions (Addendum)
blood tests are being ordered for you today.  please call [phone] to hear your test results.  You will be prompted to enter the 9-digit "MRN" number that appears at the top left of this page, followed by #.  Then you will hear the message. pending the test results, please continue the same medications for now. (update: i left message on phone-tree:  tft are normal).

## 2010-07-24 NOTE — Progress Notes (Signed)
Subjective:    Patient ID: Ellen [name], female    DOB: Aug 12, 1950, 60 y.o.   MRN: [id number]  HPI Pt reports 1 year of moderate (20-lb) weight gain, despite excellent diet and exercise efforts.  She also has slight tingling in the toes, in the context of riding her bicycle, and assoc pain.  She has been on synthroid x 3-6 months, when she was noted to have a tsh of 5.2.   Past Medical History  Diagnosis Date  . HYPERLIPIDEMIA 07/28/2007  . ANXIETY 10/16/2006  . DEPRESSION 10/16/2006  . INSOMNIA-SLEEP DISORDER-UNSPEC 10/16/2006  . ALLERGIC RHINITIS 10/16/2006  . GERD 04/13/2007  . SYNCOPE 04/13/2007  . MENOPAUSAL SYNDROME 02/17/2007    Past Surgical History  Procedure Date  . Appendectomy   . Abdominal hysterectomy     partial  . Breast reduction surgery     s/p  . Facelift     s/p  . Upper nl tsh w/sx     History   Social History  . Marital Status: Married    Spouse Name: N/A    Number of Children: 2  . Years of Education: N/A   Occupational History  . Retired    Social History Main Topics  . Smoking status: Former Games developer  . Smokeless tobacco: Not on file  . Alcohol Use:   . Drug Use:   . Sexually Active:    Other Topics Concern  . Not on file   Social History Narrative   Regular exercise2 Baker Pierini children-1 disabled with schizophrenia & 1 stepchild    Current Outpatient Prescriptions on File Prior to Visit  Medication Sig Dispense Refill  . levothyroxine (SYNTHROID, LEVOTHROID) 25 MCG tablet Take 25 mcg by mouth daily.        . Magnesium 200 MG TABS Take 1 tablet by mouth daily.        . NON FORMULARY Allertec  1 by mouth once daily as needed       . polycarbophil (FIBERCON) 625 MG tablet Take 625 mg by mouth daily.        . sertraline (ZOLOFT) 50 MG tablet Take 50 mg by mouth daily.        . vitamin B-12 (CYANOCOBALAMIN) 100 MCG tablet Take 100 mcg by mouth daily.        [name] DISCONTD: ALPRAZolam (XANAX) 0.5 MG tablet Take 1-2 tablets (0.5-1 mg total) by mouth  2 (two) times daily as needed for sleep.  60 tablet  5    No Known Allergies  Family History  Problem Relation Age of Onset  . Hypertension Other   . Heart disease Father     CAD/CABG in his 50's    BP 102/72  Pulse 70  Temp(Src) 97.8 F (36.6 C) (Oral)  Ht 5\' 3"  (1.6 m)  Wt 158 lb (71.668 kg)  BMI 27.99 kg/m2  SpO2 97% Review of Systems denies polyuria, menopausal sxs, galactorrhea, muscle weakness, fever, headache, sob, rash, blurry vision, and chest pain.  She reports mild depression, related to her mother's poor health.  She has dry skin, easy bruising, rhinorrhea, and intermittent dysuria.    Objective:   Physical Exam VS: see vs page GEN: no distress HEAD: head: no deformity eyes: no periorbital swelling, no proptosis external nose and ears are normal mouth: no lesion seen NECK: supple, thyroid is not enlarged CHEST WALL: no deformity CV: reg rate and rhythm, no murmur ABD: abdomen is soft, nontender.  no hepatosplenomegaly.  not distended.  no  hernia MUSCULOSKELETAL: muscle bulk and strength are grossly normal.  no obvious joint swelling.  gait is normal and steady EXTEMITIES: no deformity.  no ulcer on the feet.  feet are of normal color and temp.  no edema PULSES: dorsalis pedis intact bilat.  no carotid bruit NEURO:  cn 2-12 grossly intact.   readily moves all 4's.  sensation is intact to touch on the feet SKIN:  Normal texture and temperature.  No rash or suspicious lesion is visible.   NODES:  None palpable at the neck PSYCH: alert, oriented x3.  Does not appear anxious nor depressed.    Lab Results  Component Value Date   TSH 3.01 07/24/2010   Assessment & Plan:  Euthyroid on replacement Paresthesias, not thyroid-related.  new Depression, mild, not thyroid-related

## 2010-07-25 ENCOUNTER — Telehealth: Payer: Self-pay | Admitting: *Deleted

## 2010-07-25 NOTE — Telephone Encounter (Signed)
FYI: Patient was in office yesterday & forgot some information concerning her problem w/hands & feet; Pt. states that "she does have numbness and/or tingling with pain and she also has this problem at night'.

## 2010-07-25 NOTE — Telephone Encounter (Signed)
Thyroid blood tests are normal--good.  You don't have to worry about these sxs coming from your thyroid

## 2010-07-26 NOTE — Telephone Encounter (Signed)
Left message for pt to callback office.  

## 2010-07-29 NOTE — Telephone Encounter (Signed)
Pt advised and will follow up with Dr Posey Rea

## 2010-08-01 ENCOUNTER — Telehealth: Payer: Self-pay | Admitting: *Deleted

## 2010-08-01 NOTE — Telephone Encounter (Signed)
Pt wanted to know the ranges as well as her result, Pt was informed.

## 2010-08-01 NOTE — Telephone Encounter (Signed)
Patient requesting results of thyroid testing from Dr Everardo All. I see that patient was informed per previous phone message. Will need to call pt to inform again.

## 2010-08-22 ENCOUNTER — Other Ambulatory Visit: Payer: Self-pay | Admitting: Women's Health

## 2010-08-22 DIAGNOSIS — R21 Rash and other nonspecific skin eruption: Secondary | ICD-10-CM

## 2010-09-12 ENCOUNTER — Ambulatory Visit (INDEPENDENT_AMBULATORY_CARE_PROVIDER_SITE_OTHER): Payer: BC Managed Care – PPO | Admitting: Gynecology

## 2010-09-12 ENCOUNTER — Encounter: Payer: Self-pay | Admitting: Gynecology

## 2010-09-12 DIAGNOSIS — R635 Abnormal weight gain: Secondary | ICD-10-CM

## 2010-09-12 DIAGNOSIS — R5383 Other fatigue: Secondary | ICD-10-CM

## 2010-09-12 DIAGNOSIS — R1031 Right lower quadrant pain: Secondary | ICD-10-CM

## 2010-09-12 DIAGNOSIS — R5381 Other malaise: Secondary | ICD-10-CM

## 2010-09-12 DIAGNOSIS — N83209 Unspecified ovarian cyst, unspecified side: Secondary | ICD-10-CM

## 2010-09-12 NOTE — Progress Notes (Signed)
Patient presents with several complaints. She notes difficulty losing weight with a 30 pound weight gain over last several years. She also notes some intermittent right lower quadrant discomfort. She is status post appendectomy 3 years ago and said since then she's had some intermittent discomfort. No changes in her bowel habits and in fact had a colonoscopy last year. Patient also noted some thinning hair some pimples on her legs and just overall fatigue. She is being followed for hypothyroidism and recently had her thyroid dose increased from 25 mcg to 50 mcg in July.  No fevers chills night sweats urinary symptoms. She has been seen by her primary with similar complaints.   Exam Abdomen soft nontender without masses guarding rebound organomegaly Pelvic: External BUS vagina mild atrophic changes, bimanual without masses or tenderness rectovaginal exam is normal  Assessment and plan: #1 Right lower quadrant discomfort. She does have a history of ovarian cysts in the past will start with ultrasound. Assuming normal then we will follow her expectantly as I think this is probably more GI adhesive related she just had a colonoscopy last year which was normal. #2 Fatigue, weight gain. Recently had thyroid panel to her primary which was normal in July. Also had a recent change in her levothyroxin. I recommended she monitor present allowed the new dose to take effect and then see where she stands several months to have a recheck of her thyroid. I did recommend a conference of metabolic panel but no other blood work was done this was all done through her primary's office. She continues to have symptoms and she'll followup with her primary.

## 2010-09-13 LAB — COMPREHENSIVE METABOLIC PANEL
ALT: 15 U/L (ref 0–35)
AST: 23 U/L (ref 0–37)
Albumin: 4.7 g/dL (ref 3.5–5.2)
Alkaline Phosphatase: 66 U/L (ref 39–117)
BUN: 17 mg/dL (ref 6–23)
CO2: 25 mEq/L (ref 19–32)
Calcium: 9.6 mg/dL (ref 8.4–10.5)
Chloride: 103 mEq/L (ref 96–112)
Creat: 0.74 mg/dL (ref 0.50–1.10)
Glucose, Bld: 98 mg/dL (ref 70–99)
Potassium: 4.6 mEq/L (ref 3.5–5.3)
Sodium: 137 mEq/L (ref 135–145)
Total Bilirubin: 0.3 mg/dL (ref 0.3–1.2)
Total Protein: 7 g/dL (ref 6.0–8.3)

## 2010-09-18 ENCOUNTER — Ambulatory Visit (INDEPENDENT_AMBULATORY_CARE_PROVIDER_SITE_OTHER): Payer: BC Managed Care – PPO | Admitting: Internal Medicine

## 2010-09-18 ENCOUNTER — Encounter: Payer: Self-pay | Admitting: Internal Medicine

## 2010-09-18 VITALS — BP 110/70 | HR 72 | Temp 98.3°F | Resp 16 | Wt 159.0 lb

## 2010-09-18 DIAGNOSIS — F3289 Other specified depressive episodes: Secondary | ICD-10-CM

## 2010-09-18 DIAGNOSIS — N390 Urinary tract infection, site not specified: Secondary | ICD-10-CM

## 2010-09-18 DIAGNOSIS — F329 Major depressive disorder, single episode, unspecified: Secondary | ICD-10-CM

## 2010-09-18 DIAGNOSIS — R635 Abnormal weight gain: Secondary | ICD-10-CM | POA: Insufficient documentation

## 2010-09-18 DIAGNOSIS — F411 Generalized anxiety disorder: Secondary | ICD-10-CM

## 2010-09-18 DIAGNOSIS — R300 Dysuria: Secondary | ICD-10-CM

## 2010-09-18 LAB — POCT URINALYSIS DIPSTICK
Bilirubin, UA: NEGATIVE
Glucose: NEGATIVE
Ketones, UA: NEGATIVE
Nitrite, UA: NEGATIVE
Protein, UA: NEGATIVE
Spec Grav, UA: 1.005
Urobilinogen, UA: 0.2
pH, UA: 7.5

## 2010-09-18 MED ORDER — CIPROFLOXACIN HCL 500 MG PO TABS: 500.0000 mg | ORAL_TABLET | Freq: Two times a day (BID) | ORAL | Status: AC

## 2010-09-18 MED ORDER — CITALOPRAM HYDROBROMIDE 10 MG PO TABS: 10.0000 mg | ORAL_TABLET | Freq: Every day | ORAL | Status: DC

## 2010-09-18 NOTE — Assessment & Plan Note (Signed)
Cipro x 10 d RLQ related to this UTI. [place] pending

## 2010-09-18 NOTE — Assessment & Plan Note (Signed)
D/c Zoloft

## 2010-09-18 NOTE — Assessment & Plan Note (Signed)
Start Citalopram

## 2010-09-18 NOTE — Progress Notes (Signed)
  Subjective:    Patient ID: Ellen [name], female    DOB: [birth year], 60 y.o.   MRN: [id number]  HPI  C/o LBP and RLQ pain x  10 d. She saw her GYN - [place] was ordered. Now c/o burning, pain moved to RUQ and back 6/10  Review of Systems  Constitutional: Positive for fever. Negative for chills, activity change, appetite change, fatigue and unexpected weight change.  HENT: Negative for congestion, mouth sores and sinus pressure.   Eyes: Negative for visual disturbance.  Respiratory: Negative for cough and chest tightness.   Gastrointestinal: Positive for abdominal pain (sensitive in R 1/2). Negative for nausea.  Genitourinary: Positive for dysuria and flank pain (right). Negative for frequency, difficulty urinating and vaginal pain.  Musculoskeletal: Negative for back pain and gait problem.  Skin: Negative for pallor and rash.  Neurological: Negative for dizziness, tremors, weakness, numbness and headaches.  Psychiatric/Behavioral: Negative for confusion and sleep disturbance.       Objective:   Physical Exam  Constitutional: She appears well-developed. No distress.       Obese  HENT:  Head: Normocephalic.  Right Ear: External ear normal.  Left Ear: External ear normal.  Nose: Nose normal.  Mouth/Throat: Oropharynx is clear and moist.  Eyes: Conjunctivae are normal. Pupils are equal, round, and reactive to light. Right eye exhibits no discharge. Left eye exhibits no discharge.  Neck: Normal range of motion. Neck supple. No JVD present. No tracheal deviation present. No thyromegaly present.  Cardiovascular: Normal rate, regular rhythm and normal heart sounds.   Pulmonary/Chest: No stridor. No respiratory distress. She has no wheezes.  Abdominal: Soft. Bowel sounds are normal. She exhibits no distension and no mass. There is tenderness (R side of the rib cage was sensitive; abd is NT). There is no rebound and no guarding.  Musculoskeletal: She exhibits no edema and no tenderness.    Lymphadenopathy:    She has no cervical adenopathy.  Neurological: She displays normal reflexes. No cranial nerve deficit. She exhibits normal muscle tone. Coordination normal.  Skin: No rash noted. No erythema.  Psychiatric: She has a normal mood and affect. Her behavior is normal. Judgment and thought content normal.   Wt Readings from Last 3 Encounters:  09/18/10 159 lb (72.122 kg)  07/24/10 158 lb (71.668 kg)  12/07/09 145 lb (65.772 kg)     Urine dip abn     Assessment & Plan:

## 2010-09-18 NOTE — Assessment & Plan Note (Signed)
Start Celexa

## 2010-09-19 ENCOUNTER — Telehealth: Payer: Self-pay | Admitting: *Deleted

## 2010-09-19 NOTE — Telephone Encounter (Signed)
Pt called wanting to know her u/a result on last office visit 09/12/10. Lm on pt vm that dr.tf did not order a u/a, so there would be no results.

## 2010-09-25 ENCOUNTER — Encounter: Payer: Self-pay | Admitting: Gynecology

## 2010-09-25 ENCOUNTER — Ambulatory Visit (INDEPENDENT_AMBULATORY_CARE_PROVIDER_SITE_OTHER): Payer: BC Managed Care – PPO | Admitting: Gynecology

## 2010-09-25 ENCOUNTER — Ambulatory Visit (INDEPENDENT_AMBULATORY_CARE_PROVIDER_SITE_OTHER): Admission: RE | Admit: 2010-09-25 | Payer: BC Managed Care – PPO | Source: Ambulatory Visit

## 2010-09-25 DIAGNOSIS — R102 Pelvic and perineal pain: Secondary | ICD-10-CM

## 2010-09-25 DIAGNOSIS — N949 Unspecified condition associated with female genital organs and menstrual cycle: Secondary | ICD-10-CM

## 2010-09-25 DIAGNOSIS — N390 Urinary tract infection, site not specified: Secondary | ICD-10-CM

## 2010-09-25 DIAGNOSIS — N8300 Follicular cyst of ovary, unspecified side: Secondary | ICD-10-CM

## 2010-09-25 DIAGNOSIS — R1031 Right lower quadrant pain: Secondary | ICD-10-CM

## 2010-09-25 NOTE — Progress Notes (Signed)
Patient follows up for ultrasound due to her pelvic right-sided pain. Interestingly last week she had an exacerbation of her pain saw Dr. Posey Rea was diagnosed with a UTI she started on ciprofloxacin still taking and notes her pain seems a lot better.  Exam Spine: Straight no CVA tenderness Abdomen: Soft nontender without masses guarding rebound organomegaly  Ultrasound is normal showing right left ovaries with small physiologic changes.   Discussed with patient and her husband as long as pain totally resolves we'll follow if pain persists I discussed possible kidney stone as one etiology and we may want to take a look if her pain persists. Again assuming it totally resolves with her antibiotic treatment that will monitor and she agrees with the plan.

## 2010-09-30 LAB — BASIC METABOLIC PANEL
BUN: 12
CO2: 27
Calcium: 9.7
Chloride: 107
Creatinine, Ser: 0.68
GFR calc Af Amer: >60
GFR calc non Af Amer: >60
Glucose, Bld: 103 — ABNORMAL HIGH
Potassium: 3.9
Sodium: 142

## 2010-09-30 LAB — DIFFERENTIAL
Basophils Absolute: 0
Basophils Relative: 1
Eosinophils Absolute: 0.3
Eosinophils Relative: 4
Lymphocytes Relative: 44
Lymphs Abs: 3.4
Monocytes Absolute: 0.5
Monocytes Relative: 7
Neutro Abs: 3.4
Neutrophils Relative %: 44

## 2010-09-30 LAB — CBC
HCT: 40.2
Hemoglobin: 14.2
MCHC: 35.4
MCV: 89.8
Platelets: 214
RBC: 4.47
RDW: 13.4
WBC: 7.6

## 2010-09-30 LAB — URINALYSIS, ROUTINE W REFLEX MICROSCOPIC
Bilirubin Urine: NEGATIVE
Glucose, UA: NEGATIVE
Hgb urine dipstick: NEGATIVE
Ketones, ur: 15 — AB
Nitrite: NEGATIVE
Protein, ur: NEGATIVE
Specific Gravity, Urine: 1.005
Urobilinogen, UA: 0.2
pH: 7.5

## 2010-09-30 LAB — POCT CARDIAC MARKERS
CKMB, poc: 2.3
Myoglobin, poc: 31.2
Operator id: 4295
Troponin i, poc: <0.05

## 2010-10-07 ENCOUNTER — Ambulatory Visit: Payer: BC Managed Care – PPO

## 2010-10-08 ENCOUNTER — Ambulatory Visit: Payer: BC Managed Care – PPO

## 2010-10-10 ENCOUNTER — Ambulatory Visit (INDEPENDENT_AMBULATORY_CARE_PROVIDER_SITE_OTHER): Payer: BC Managed Care – PPO | Admitting: *Deleted

## 2010-10-10 ENCOUNTER — Telehealth: Payer: Self-pay | Admitting: *Deleted

## 2010-10-10 DIAGNOSIS — Z23 Encounter for immunization: Secondary | ICD-10-CM

## 2010-10-10 NOTE — Telephone Encounter (Signed)
No, there is no reliable test. There is a varicella titer test to see if the pt had chickenpox or not or had a vaccine - limited use Thx

## 2010-10-10 NOTE — Telephone Encounter (Signed)
Pt informed

## 2010-10-10 NOTE — Telephone Encounter (Signed)
Pt was here in the office for a flu shot and had a question on lab test to check for shingles. Is there a lab test

## 2010-10-11 ENCOUNTER — Telehealth: Payer: Self-pay | Admitting: *Deleted

## 2010-10-11 NOTE — Telephone Encounter (Signed)
Patient requesting advisement from MD. Her dentist gave her majic mouthwash for a rash in her upper inside of her mouth. He feels that she may have shingles. Pt has been doing Nurse, mental health and wants to know if she needs an antiviral RX?

## 2010-10-11 NOTE — Telephone Encounter (Signed)
Scheduled for OV Monday for eval. She declined offer for Sat clinic apt

## 2010-10-11 NOTE — Telephone Encounter (Signed)
I can't tell w/o seeing her. Can she go to [facility] tomorrow? Thx

## 2010-10-14 ENCOUNTER — Ambulatory Visit: Payer: BC Managed Care – PPO | Admitting: Internal Medicine

## 2010-10-15 ENCOUNTER — Ambulatory Visit: Payer: BC Managed Care – PPO

## 2010-10-31 ENCOUNTER — Telehealth: Payer: Self-pay | Admitting: *Deleted

## 2010-10-31 NOTE — Telephone Encounter (Signed)
error 

## 2010-11-13 ENCOUNTER — Telehealth: Payer: Self-pay | Admitting: *Deleted

## 2010-11-13 ENCOUNTER — Encounter: Payer: Self-pay | Admitting: *Deleted

## 2010-11-13 NOTE — Telephone Encounter (Signed)
Rec pc from pt stating she broke her ankle and is requiring surgery ASAP. Melodye Ped, DPM is requiring surgical clearance letter and most recent EKG, labs be faxed to [phone]. Ok per AVP. Letter ready,signed, faxed with requested results.

## 2010-11-13 NOTE — Telephone Encounter (Signed)
Correction- I faxed requested info along with letter to [phone]. Attn: Sharrell Ku.

## 2011-01-21 ENCOUNTER — Other Ambulatory Visit: Payer: Self-pay | Admitting: Internal Medicine

## 2011-01-27 ENCOUNTER — Other Ambulatory Visit: Payer: Self-pay | Admitting: *Deleted

## 2011-01-27 MED ORDER — LEVOTHYROXINE SODIUM 25 MCG PO TABS: 25.0000 ug | ORAL_TABLET | Freq: Every day | ORAL | Status: DC

## 2011-04-17 ENCOUNTER — Telehealth: Payer: Self-pay | Admitting: *Deleted

## 2011-04-17 DIAGNOSIS — Z0000 Encounter for general adult medical examination without abnormal findings: Secondary | ICD-10-CM

## 2011-04-17 NOTE — Telephone Encounter (Signed)
July CPE labs entered.

## 2011-06-16 ENCOUNTER — Other Ambulatory Visit: Payer: Self-pay | Admitting: *Deleted

## 2011-06-16 MED ORDER — CITALOPRAM HYDROBROMIDE 10 MG PO TABS: 10.0000 mg | ORAL_TABLET | Freq: Every day | ORAL | Status: DC

## 2011-07-09 ENCOUNTER — Other Ambulatory Visit (INDEPENDENT_AMBULATORY_CARE_PROVIDER_SITE_OTHER): Payer: BC Managed Care – PPO

## 2011-07-09 DIAGNOSIS — Z0000 Encounter for general adult medical examination without abnormal findings: Secondary | ICD-10-CM

## 2011-07-09 LAB — URINALYSIS, ROUTINE W REFLEX MICROSCOPIC
Bilirubin Urine: NEGATIVE
Hgb urine dipstick: NEGATIVE
Ketones, ur: NEGATIVE
Nitrite: NEGATIVE
Specific Gravity, Urine: <=1.005 (ref 1.000–1.030)
Total Protein, Urine: NEGATIVE
Urine Glucose: NEGATIVE
Urobilinogen, UA: 0.2 (ref 0.0–1.0)
pH: 7 (ref 5.0–8.0)

## 2011-07-09 LAB — HEPATIC FUNCTION PANEL
ALT: 20 U/L (ref 0–35)
AST: 24 U/L (ref 0–37)
Albumin: 4.2 g/dL (ref 3.5–5.2)
Alkaline Phosphatase: 62 U/L (ref 39–117)
Bilirubin, Direct: 0.1 mg/dL (ref 0.0–0.3)
Total Bilirubin: 0.8 mg/dL (ref 0.3–1.2)
Total Protein: 7.1 g/dL (ref 6.0–8.3)

## 2011-07-09 LAB — CBC WITH DIFFERENTIAL/PLATELET
Basophils Absolute: 0.1 10*3/uL (ref 0.0–0.1)
Basophils Relative: 0.8 % (ref 0.0–3.0)
Eosinophils Absolute: 0.5 10*3/uL (ref 0.0–0.7)
Eosinophils Relative: 6.9 % — ABNORMAL HIGH (ref 0.0–5.0)
HCT: 42.3 % (ref 36.0–46.0)
Hemoglobin: 13.9 g/dL (ref 12.0–15.0)
Lymphocytes Relative: 40.2 % (ref 12.0–46.0)
Lymphs Abs: 2.6 10*3/uL (ref 0.7–4.0)
MCHC: 32.9 g/dL (ref 30.0–36.0)
MCV: 92.8 fl (ref 78.0–100.0)
Monocytes Absolute: 0.5 10*3/uL (ref 0.1–1.0)
Monocytes Relative: 8.2 % (ref 3.0–12.0)
Neutro Abs: 2.9 10*3/uL (ref 1.4–7.7)
Neutrophils Relative %: 43.9 % (ref 43.0–77.0)
Platelets: 214 10*3/uL (ref 150.0–400.0)
RBC: 4.56 Mil/uL (ref 3.87–5.11)
RDW: 14.2 % (ref 11.5–14.6)
WBC: 6.6 10*3/uL (ref 4.5–10.5)

## 2011-07-09 LAB — BASIC METABOLIC PANEL
BUN: 15 mg/dL (ref 6–23)
CO2: 28 mEq/L (ref 19–32)
Calcium: 9.1 mg/dL (ref 8.4–10.5)
Chloride: 106 mEq/L (ref 96–112)
Creatinine, Ser: 0.7 mg/dL (ref 0.4–1.2)
GFR: 91.39 mL/min (ref >60.00)
Glucose, Bld: 95 mg/dL (ref 70–99)
Potassium: 4.5 mEq/L (ref 3.5–5.1)
Sodium: 141 mEq/L (ref 135–145)

## 2011-07-09 LAB — LIPID PANEL
Cholesterol: 249 mg/dL — ABNORMAL HIGH (ref 0–200)
HDL: 76.2 mg/dL (ref >39.00)
Total CHOL/HDL Ratio: 3
Triglycerides: 112 mg/dL (ref 0.0–149.0)
VLDL: 22.4 mg/dL (ref 0.0–40.0)

## 2011-07-09 LAB — TSH: TSH: 2.69 u[IU]/mL (ref 0.35–5.50)

## 2011-07-09 LAB — LDL CHOLESTEROL, DIRECT: Direct LDL: 142.6 mg/dL

## 2011-07-15 ENCOUNTER — Other Ambulatory Visit: Payer: Self-pay

## 2011-07-15 ENCOUNTER — Ambulatory Visit (INDEPENDENT_AMBULATORY_CARE_PROVIDER_SITE_OTHER): Payer: BC Managed Care – PPO | Admitting: Internal Medicine

## 2011-07-15 ENCOUNTER — Encounter: Payer: Self-pay | Admitting: Internal Medicine

## 2011-07-15 VITALS — BP 148/90 | HR 80 | Temp 98.2°F | Resp 16 | Ht 62.0 in | Wt 162.0 lb

## 2011-07-15 DIAGNOSIS — Z0000 Encounter for general adult medical examination without abnormal findings: Secondary | ICD-10-CM

## 2011-07-15 DIAGNOSIS — F3289 Other specified depressive episodes: Secondary | ICD-10-CM

## 2011-07-15 DIAGNOSIS — R635 Abnormal weight gain: Secondary | ICD-10-CM

## 2011-07-15 DIAGNOSIS — S82899A Other fracture of unspecified lower leg, initial encounter for closed fracture: Secondary | ICD-10-CM

## 2011-07-15 DIAGNOSIS — S82891A Other fracture of right lower leg, initial encounter for closed fracture: Secondary | ICD-10-CM | POA: Insufficient documentation

## 2011-07-15 DIAGNOSIS — J019 Acute sinusitis, unspecified: Secondary | ICD-10-CM

## 2011-07-15 DIAGNOSIS — E785 Hyperlipidemia, unspecified: Secondary | ICD-10-CM

## 2011-07-15 DIAGNOSIS — F329 Major depressive disorder, single episode, unspecified: Secondary | ICD-10-CM

## 2011-07-15 DIAGNOSIS — I10 Essential (primary) hypertension: Secondary | ICD-10-CM | POA: Insufficient documentation

## 2011-07-15 MED ORDER — LEVOTHYROXINE SODIUM 25 MCG PO TABS: 25.0000 ug | ORAL_TABLET | Freq: Every day | ORAL | Status: DC

## 2011-07-15 MED ORDER — HYDROCHLOROTHIAZIDE 12.5 MG PO CAPS: 12.5000 mg | ORAL_CAPSULE | Freq: Every day | ORAL | Status: DC

## 2011-07-15 MED ORDER — AMOXICILLIN 500 MG PO CAPS: 1000.0000 mg | ORAL_CAPSULE | Freq: Two times a day (BID) | ORAL | Status: AC

## 2011-07-15 MED ORDER — CITALOPRAM HYDROBROMIDE 10 MG PO TABS: 10.0000 mg | ORAL_TABLET | Freq: Every day | ORAL | Status: DC

## 2011-07-15 NOTE — Assessment & Plan Note (Signed)
We discussed age appropriate health related issues, including available/recomended screening tests and vaccinations. We discussed a need for adhering to healthy diet and exercise. Labs/EKG were reviewed/ordered. All questions were answered.   

## 2011-07-15 NOTE — Assessment & Plan Note (Signed)
Start HCTZ 

## 2011-07-15 NOTE — Assessment & Plan Note (Signed)
Amoxicillin

## 2011-07-15 NOTE — Assessment & Plan Note (Signed)
  On diet  

## 2011-07-15 NOTE — Assessment & Plan Note (Signed)
Discussed diet  

## 2011-07-15 NOTE — Assessment & Plan Note (Addendum)
2012 open red/fix -- s/p fall

## 2011-07-15 NOTE — Progress Notes (Signed)
Subjective:    Patient ID: Ellen [name], female    DOB: 12-06-1950, 61 y.o.   MRN: [id number]  HPI  The patient is here for a wellness exam. The patient has been doing well overall without major physical or psychological issues going on lately. The patient needs to address  chronic hypertension that has been well controlled with medicines; to address chronic  hyperlipidemia controlled with medicines as well; and to address controlled with medical treatment and diet. C/o R ankle fx in 2012 C/o wt gain  BP Readings from Last 3 Encounters:  07/15/11 148/90  09/18/10 110/70  07/24/10 102/72   Wt Readings from Last 3 Encounters:  07/15/11 162 lb (73.483 kg)  09/18/10 159 lb (72.122 kg)  07/24/10 158 lb (71.668 kg)       Review of Systems  Constitutional: Negative.  Negative for fever, chills, diaphoresis, activity change, appetite change, fatigue and unexpected weight change.  HENT: Negative for hearing loss, ear pain, nosebleeds, congestion, sore throat, facial swelling, rhinorrhea, sneezing, mouth sores, trouble swallowing, neck pain, neck stiffness, postnasal drip, sinus pressure and tinnitus.   Eyes: Negative for pain, discharge, redness, itching and visual disturbance.  Respiratory: Negative for cough, chest tightness, shortness of breath, wheezing and stridor.   Cardiovascular: Negative for chest pain, palpitations and leg swelling.  Gastrointestinal: Negative for nausea, abdominal pain, diarrhea, constipation, blood in stool, abdominal distention, anal bleeding and rectal pain.  Genitourinary: Negative for dysuria, urgency, frequency, hematuria, flank pain (right), vaginal bleeding, vaginal discharge, difficulty urinating, genital sores, vaginal pain and pelvic pain.  Musculoskeletal: Negative for back pain, joint swelling, arthralgias and gait problem.  Skin: Negative.  Negative for pallor and rash.  Neurological: Negative for dizziness, tremors, seizures, syncope, speech  difficulty, weakness, numbness and headaches.  Hematological: Negative for adenopathy. Does not bruise/bleed easily.  Psychiatric/Behavioral: Negative for suicidal ideas, behavioral problems, confusion, disturbed wake/sleep cycle, dysphoric mood and decreased concentration. The patient is not nervous/anxious.        Objective:   Physical Exam  Constitutional: She appears well-developed. No distress.       Obese  HENT:  Head: Normocephalic.  Right Ear: External ear normal.  Left Ear: External ear normal.  Nose: Nose normal.  Mouth/Throat: Oropharynx is clear and moist.  Eyes: Conjunctivae are normal. Pupils are equal, round, and reactive to light. Right eye exhibits no discharge. Left eye exhibits no discharge.  Neck: Normal range of motion. Neck supple. No JVD present. No tracheal deviation present. No thyromegaly present.  Cardiovascular: Normal rate, regular rhythm and normal heart sounds.   Pulmonary/Chest: No stridor. No respiratory distress. She has no wheezes.  Abdominal: Soft. Bowel sounds are normal. She exhibits no distension and no mass. There is no tenderness. There is no rebound and no guarding.  Musculoskeletal: She exhibits no edema and no tenderness.       R lat ankle w/a scar  Lymphadenopathy:    She has no cervical adenopathy.  Neurological: She displays normal reflexes. No cranial nerve deficit. She exhibits normal muscle tone. Coordination normal.  Skin: No rash noted. No erythema.  Psychiatric: She has a normal mood and affect. Her behavior is normal. Judgment and thought content normal.   Lab Results  Component Value Date   WBC 6.6 07/09/2011   HGB 13.9 07/09/2011   HCT 42.3 07/09/2011   PLT 214.0 07/09/2011   GLUCOSE 95 07/09/2011   CHOL 249* 07/09/2011   TRIG 112.0 07/09/2011   HDL 76.20 07/09/2011  LDLDIRECT 142.6 07/09/2011   LDLCALC 105* 10/12/2006   ALT 20 07/09/2011   AST 24 07/09/2011   NA 141 07/09/2011   K 4.5 07/09/2011   CL 106 07/09/2011   CREATININE 0.7 07/09/2011     BUN 15 07/09/2011   CO2 28 07/09/2011   TSH 2.69 07/09/2011   HGBA1C 5.6 10/12/2006          Assessment & Plan:

## 2011-07-15 NOTE — Assessment & Plan Note (Signed)
Continue with current prescription therapy as reflected on the Med list.  

## 2011-07-29 ENCOUNTER — Encounter: Payer: Self-pay | Admitting: Internal Medicine

## 2011-08-05 ENCOUNTER — Encounter: Payer: Self-pay | Admitting: Gynecology

## 2011-08-05 ENCOUNTER — Ambulatory Visit (INDEPENDENT_AMBULATORY_CARE_PROVIDER_SITE_OTHER): Payer: BC Managed Care – PPO | Admitting: Gynecology

## 2011-08-05 VITALS — BP 116/74 | Ht 62.5 in | Wt 161.0 lb

## 2011-08-05 DIAGNOSIS — Z01419 Encounter for gynecological examination (general) (routine) without abnormal findings: Secondary | ICD-10-CM

## 2011-08-05 NOTE — Patient Instructions (Signed)
Follow up in one year for annual gynecologic exam. 

## 2011-08-05 NOTE — Progress Notes (Signed)
Ellen [name] 10/28/1950 [id number]        61 y.o.  [id number] for annual exam.  Doing well from a gynecologic standpoint.  Past medical history,surgical history, medications, allergies, family history and social history were all reviewed and documented in the EPIC chart. ROS:  Was performed and pertinent positives and negatives are included in the history.  Exam: Sherrilyn Rist assistant Filed Vitals:   08/05/11 1117  BP: 116/74  Height: 5' 2.5" (1.588 m)  Weight: 161 lb (73.029 kg)   General appearance  Normal Skin grossly normal Head/Neck normal with no cervical or supraclavicular adenopathy thyroid normal Lungs  clear Cardiac RR, without RMG Abdominal  soft, nontender, without masses, organomegaly or hernia Breasts  examined lying and sitting without masses, retractions, discharge or axillary adenopathy.  Well-healed bilateral reduction scars. Pelvic  Ext/BUS/vagina  normal   Anus and perineum  normal   Rectovaginal  normal sphincter tone without palpated masses or tenderness.    Assessment/Plan:  61 y.o. [id number] female for annual exam.   1. Status post TVH doing well. On no HRT without menopausal symptoms. 2. Pap smear. Pap 2012 normal. Patient is status post [facility] for benign indications with no history of abnormal Pap smears before. Options to stop altogether were less frequent screening reviewed and we'll readdress on an annual basis. 3. Mammography.  Patient had mammogram this year. She'll continue with annual mammography. SBE monthly reviewed. 4. Colonoscopy. Patient had colonoscopy 2011. We'll follow up with their recommended screening interval. 5. DEXA. DEXA 2012 normal. Will repeat that a two-year interval. Recent ankle fracture due to fall which sounded more high impact. His calcium vitamin D discussed. 6. Health maintenance. No lab work was done today as it's all done through Dr. Posey Rea office who she actively see's. Follow up in one year, sooner as needed.    Dara Lords MD, 12:13 PM 08/05/2011

## 2011-08-08 ENCOUNTER — Encounter: Payer: Self-pay | Admitting: Gynecology

## 2011-08-11 ENCOUNTER — Ambulatory Visit (INDEPENDENT_AMBULATORY_CARE_PROVIDER_SITE_OTHER): Payer: BC Managed Care – PPO | Admitting: Internal Medicine

## 2011-08-11 ENCOUNTER — Encounter: Payer: Self-pay | Admitting: Internal Medicine

## 2011-08-11 VITALS — BP 120/82 | HR 67 | Temp 98.0°F | Ht 62.5 in | Wt 155.0 lb

## 2011-08-11 DIAGNOSIS — L255 Unspecified contact dermatitis due to plants, except food: Secondary | ICD-10-CM

## 2011-08-11 DIAGNOSIS — L237 Allergic contact dermatitis due to plants, except food: Secondary | ICD-10-CM

## 2011-08-11 MED ORDER — TRIAMCINOLONE ACETONIDE 0.1 % EX CREA: TOPICAL_CREAM | Freq: Two times a day (BID) | CUTANEOUS | Status: AC

## 2011-08-11 MED ORDER — PREDNISONE (PAK) 10 MG PO TABS: 10.0000 mg | ORAL_TABLET | ORAL | Status: AC

## 2011-08-11 MED ORDER — HALOBETASOL PROPIONATE 0.05 % EX CREA: TOPICAL_CREAM | Freq: Two times a day (BID) | CUTANEOUS | Status: AC

## 2011-08-11 NOTE — Progress Notes (Signed)
  Subjective:    Patient ID: Ellen [name], female    DOB: 04-12-1950, 61 y.o.   MRN: [id number]  HPI  complains of poison ivy exposure Onset rash 36h ago rash on right wrist Using steroid ointment - but upcoming trip to [facility] for funeral  Past Medical History  Diagnosis Date  . HYPERLIPIDEMIA   . INSOMNIA-SLEEP DISORDER-UNSPEC   . ALLERGIC RHINITIS   . GERD   . SYNCOPE   . DEPRESSION   . ANXIETY     Review of Systems  Constitutional: Negative for fever and fatigue.  Respiratory: Negative for cough and shortness of breath.   Skin: Positive for rash.       Objective:   Physical Exam  BP 120/82  Pulse 67  Temp 98 F (36.7 C) (Oral)  Ht 5' 2.5" (1.588 m)  Wt 155 lb (70.308 kg)  BMI 27.90 kg/m2  SpO2 97% Gen: NAD Skin - contact dermatitis R wrist, radial side - no cellulitis, vesicles or blisters      Assessment & Plan:  Contact dermatitis - poison oak Hx same - known exposure Continue topical steroids and use pred taper x 6days benadryl prn

## 2011-08-11 NOTE — Patient Instructions (Signed)
It was good to see you today. Prednisone taper as discussed - Your prescription(s) have been submitted to your pharmacy. Please take as directed and contact our office if you believe you are having problem(s) with the medication(s). Use steroid creams and Benadryl as needed for itch Poison [facility] is a rash caused by touching the leaves of the poison oak plant. You may have a rash with redness and itching. Sometimes, blisters appear and break open. Your eyes may get puffy (swollen). Poison oak often heals in 2 to 3 weeks without treatment.   HOME CARE  If you touch poison oak:   Wash your skin with soap and water right away. Wash under your fingernails. Do not rub the skin very hard.   Wash any clothes you were wearing.   Avoid poison oak in the future. Poison oak usually has 3 leaves on a stem.   Use medicines to help with itching as told by your doctor. Do not drive when you take this medicine.   Keep open sores dry, clean, and covered with a bandage and medicated cream, if needed.   Ask your doctor about medicine for children.  GET HELP RIGHT AWAY IF:  You have open sores.   Redness spreads beyond the area of the rash.   There is yellowish white fluid (pus) coming from the rash.   Pain gets worse.   You have a temperature by mouth above 102 F (38.9 C), not controlled by medicine.  MAKE SURE YOU:  Understand these instructions.   Will watch your condition.   Will get help right away if you are not doing well or get worse.  Document Released: 01/25/2010 Document Revised: 12/12/2010 Document Reviewed: 01/25/2010 [facility] Patient Information 2012 [place], [place].

## 2011-08-18 ENCOUNTER — Telehealth: Payer: Self-pay | Admitting: Internal Medicine

## 2011-08-18 MED ORDER — ZOLPIDEM TARTRATE 10 MG PO TABS: 10.0000 mg | ORAL_TABLET | Freq: Every evening | ORAL | Status: DC | PRN

## 2011-08-18 NOTE — Telephone Encounter (Signed)
Caller: [name]/Patient; Patient Name: Ellen [name]; PCP: Sonda Primes; Best Callback Phone Number: [phone].  Patient relates she is not sleeping well; mother-in-law passed away 08-31-2011 and she is having to move her mother's belongings out of her house rapidly and is under a lot of stress.  She wants to inform Dr. Posey Rea of 10 lb weight loss, BP  112/76 since her last office visit.   She requests Ambien CR.  Insomnia resulting in less than 4 hours sleep per night for at least 2 weeks.   Advised see in 72 hours per Sleep Disorder protocol.  Caller states she will not be back in town until the end of the month; she will be in [place] for 3 days and then she is going to [place] for her mother in law's funeral.  Note to provider requesting Rx to [facility] on [address] per caller request.  Pharmacy information verified in [place].

## 2011-08-18 NOTE — Telephone Encounter (Signed)
OK ambien - pls call in OV if problems Thx

## 2011-08-19 NOTE — Telephone Encounter (Signed)
Rx called into pharmacy spoke with heather/ rep. MD already updated EPIC. Notified pt rx call into [facility]... 08/19/11@9 :41am/LMB

## 2011-11-03 ENCOUNTER — Ambulatory Visit (INDEPENDENT_AMBULATORY_CARE_PROVIDER_SITE_OTHER): Payer: BC Managed Care – PPO

## 2011-11-03 DIAGNOSIS — Z23 Encounter for immunization: Secondary | ICD-10-CM

## 2011-11-18 ENCOUNTER — Encounter: Payer: Self-pay | Admitting: Internal Medicine

## 2011-11-18 ENCOUNTER — Ambulatory Visit (INDEPENDENT_AMBULATORY_CARE_PROVIDER_SITE_OTHER): Payer: BC Managed Care – PPO | Admitting: Internal Medicine

## 2011-11-18 ENCOUNTER — Other Ambulatory Visit: Payer: Self-pay | Admitting: *Deleted

## 2011-11-18 VITALS — BP 108/80 | HR 72 | Temp 97.3°F | Resp 16 | Wt 152.0 lb

## 2011-11-18 DIAGNOSIS — B001 Herpesviral vesicular dermatitis: Secondary | ICD-10-CM | POA: Insufficient documentation

## 2011-11-18 DIAGNOSIS — B009 Herpesviral infection, unspecified: Secondary | ICD-10-CM

## 2011-11-18 MED ORDER — AMOXICILLIN 500 MG PO CAPS: 1000.0000 mg | ORAL_CAPSULE | Freq: Two times a day (BID) | ORAL | Status: DC

## 2011-11-18 MED ORDER — ACYCLOVIR 400 MG PO TABS: 400.0000 mg | ORAL_TABLET | Freq: Three times a day (TID) | ORAL | Status: DC

## 2011-11-18 NOTE — Progress Notes (Signed)
Patient ID: Ellen [name], female   DOB: 01/22/1950, 61 y.o.   MRN: [id number]  C/o a sore under nose x 2-3 d C/o sinusitis  BP 108/80  Pulse 72  Temp 97.3 F (36.3 C) (Oral)  Resp 16  Wt 152 lb (68.947 kg)  Exam a 4 mm crust under nose

## 2011-11-18 NOTE — Assessment & Plan Note (Signed)
Acyclovir po prn

## 2011-12-21 ENCOUNTER — Encounter: Payer: Self-pay | Admitting: Internal Medicine

## 2011-12-23 ENCOUNTER — Telehealth: Payer: Self-pay | Admitting: Internal Medicine

## 2011-12-23 NOTE — Telephone Encounter (Signed)
OV note and labs faxed.

## 2011-12-23 NOTE — Telephone Encounter (Signed)
Misty Stanley, please, fax my last OV note and labs to Dr Sandy Salaam - the pt is clear for surgery Thx

## 2011-12-30 ENCOUNTER — Encounter: Payer: Self-pay | Admitting: Internal Medicine

## 2012-01-02 ENCOUNTER — Telehealth: Payer: Self-pay | Admitting: Internal Medicine

## 2012-01-02 NOTE — Telephone Encounter (Signed)
Patient will be having procedure on her eyelids.  Records were sent except for EKG with interpretation.  Please send to Dr. Chales Abrahams Contogiannis.  Questions, please call patient [phone], or Marcelino Duster at [phone].

## 2012-01-08 NOTE — Telephone Encounter (Signed)
No recent EKG. Ellen [name] can come for an EKG Thx

## 2012-01-09 NOTE — Telephone Encounter (Signed)
She picked up the copy of the one she did in 2009.  If a new one is needed she is aware to call to set it up.

## 2012-01-12 ENCOUNTER — Encounter: Payer: Self-pay | Admitting: Internal Medicine

## 2012-01-12 ENCOUNTER — Ambulatory Visit (INDEPENDENT_AMBULATORY_CARE_PROVIDER_SITE_OTHER): Payer: BC Managed Care – PPO | Admitting: Internal Medicine

## 2012-01-12 VITALS — BP 128/70 | HR 80 | Temp 98.1°F | Resp 16 | Wt 156.0 lb

## 2012-01-12 DIAGNOSIS — Z01818 Encounter for other preprocedural examination: Secondary | ICD-10-CM

## 2012-01-12 DIAGNOSIS — Z136 Encounter for screening for cardiovascular disorders: Secondary | ICD-10-CM

## 2012-01-12 NOTE — Progress Notes (Signed)
   Subjective:    HPI She needs an EKG for eyelids surgery  BP Readings from Last 3 Encounters:  01/12/12 128/70  11/18/11 108/80  08/11/11 120/82   Wt Readings from Last 3 Encounters:  01/12/12 156 lb (70.761 kg)  11/18/11 152 lb (68.947 kg)  08/11/11 155 lb (70.308 kg)       Review of Systems  Constitutional: Negative.  Negative for fever, chills, diaphoresis, activity change, appetite change, fatigue and unexpected weight change.  HENT: Negative for hearing loss, ear pain, nosebleeds, congestion, sore throat, facial swelling, rhinorrhea, sneezing, mouth sores, trouble swallowing, neck pain, neck stiffness, postnasal drip, sinus pressure and tinnitus.   Eyes: Negative for pain, discharge, redness, itching and visual disturbance.  Respiratory: Negative for cough, chest tightness, shortness of breath, wheezing and stridor.   Cardiovascular: Negative for chest pain, palpitations and leg swelling.  Gastrointestinal: Negative for nausea, abdominal pain, diarrhea, constipation, blood in stool, abdominal distention, anal bleeding and rectal pain.  Genitourinary: Negative for dysuria, urgency, frequency, hematuria, flank pain (right), vaginal bleeding, vaginal discharge, difficulty urinating, genital sores, vaginal pain and pelvic pain.  Musculoskeletal: Negative for back pain, joint swelling, arthralgias and gait problem.  Skin: Negative.  Negative for pallor and rash.  Neurological: Negative for dizziness, tremors, seizures, syncope, speech difficulty, weakness, numbness and headaches.  Hematological: Negative for adenopathy. Does not bruise/bleed easily.  Psychiatric/Behavioral: Negative for suicidal ideas, behavioral problems, confusion, sleep disturbance, dysphoric mood and decreased concentration. The patient is not nervous/anxious.        Objective:   Physical Exam  Constitutional: She appears well-developed. No distress.       Obese  HENT:  Head: Normocephalic.  Right  Ear: External ear normal.  Left Ear: External ear normal.  Nose: Nose normal.  Mouth/Throat: Oropharynx is clear and moist.  Eyes: Conjunctivae normal are normal. Pupils are equal, round, and reactive to light. Right eye exhibits no discharge. Left eye exhibits no discharge.  Neck: Normal range of motion. Neck supple. No JVD present. No tracheal deviation present. No thyromegaly present.  Cardiovascular: Normal rate, regular rhythm and normal heart sounds.   Pulmonary/Chest: No stridor. No respiratory distress. She has no wheezes.  Abdominal: Soft. Bowel sounds are normal. She exhibits no distension and no mass. There is no tenderness. There is no rebound and no guarding.  Musculoskeletal: She exhibits no edema and no tenderness.       R lat ankle w/a scar  Lymphadenopathy:    She has no cervical adenopathy.  Neurological: She displays normal reflexes. No cranial nerve deficit. She exhibits normal muscle tone. Coordination normal.  Skin: No rash noted. No erythema.  Psychiatric: She has a normal mood and affect. Her behavior is normal. Judgment and thought content normal.   EKG OK        Assessment & Plan:

## 2012-01-12 NOTE — Assessment & Plan Note (Signed)
1/14 She is clear for eyelid surgery EKG _ WNL

## 2012-01-17 ENCOUNTER — Other Ambulatory Visit: Payer: Self-pay | Admitting: Internal Medicine

## 2012-01-17 ENCOUNTER — Encounter: Payer: Self-pay | Admitting: Internal Medicine

## 2012-01-19 ENCOUNTER — Telehealth: Payer: Self-pay | Admitting: *Deleted

## 2012-01-19 MED ORDER — AMOXICILLIN 500 MG PO CAPS: 1000.0000 mg | ORAL_CAPSULE | Freq: Two times a day (BID) | ORAL | Status: DC

## 2012-01-19 NOTE — Telephone Encounter (Signed)
Please advise on below copied email from pt:  Hi! My cold sore is not better and I ha e been taking Acyclovir 3 x a day for 7 days and Muriprosin every day sev. times. I ne end to refill Amoxicillin and take it since I am leaving today to help grands in [place]. Thanks!

## 2012-01-19 NOTE — Telephone Encounter (Signed)
I'm not sure I understand the transcription. OK to ref Amoxicillin if needed (done) Thx

## 2012-01-20 NOTE — Telephone Encounter (Signed)
Pt informed

## 2012-01-24 ENCOUNTER — Encounter: Payer: Self-pay | Admitting: Internal Medicine

## 2012-04-10 ENCOUNTER — Encounter: Payer: Self-pay | Admitting: Internal Medicine

## 2012-04-12 ENCOUNTER — Telehealth: Payer: Self-pay | Admitting: *Deleted

## 2012-04-12 NOTE — Telephone Encounter (Signed)
Please advise on pt email

## 2012-04-12 NOTE — Telephone Encounter (Signed)
Sorry- here it is:  Hello Dr. Posey Rea,  I hope you are doing great!  I have had waves of nausea and diarrhea recently over a period of two weeks. I don't think it is related to a virus or food borne bacteria. I am 61 and wondered if it could be related to gall bladder attack. I have had some pain and pressure in my upper right abdomen and shoulder. Is there a blood test that can be done to determine this condition?

## 2012-04-12 NOTE — Telephone Encounter (Signed)
Sorry you have these GI issues. We can check your live test but they may be falsely normal. Abdominal US is the best test. Lactose intolerance can cause your sx's - try not to use any milk products for a few days Thx

## 2012-04-12 NOTE — Telephone Encounter (Signed)
I don't have it Thx 

## 2012-04-13 ENCOUNTER — Telehealth: Payer: Self-pay | Admitting: *Deleted

## 2012-04-13 DIAGNOSIS — R1011 Right upper quadrant pain: Secondary | ICD-10-CM

## 2012-04-13 NOTE — Telephone Encounter (Signed)
Pt would like to proceed with Abd u/s Re: abdominal discomfort. Please advise.

## 2012-04-13 NOTE — Telephone Encounter (Signed)
Pt informed

## 2012-04-14 ENCOUNTER — Encounter: Payer: Self-pay | Admitting: Internal Medicine

## 2012-04-14 NOTE — Telephone Encounter (Signed)
Ok done

## 2012-04-14 NOTE — Telephone Encounter (Signed)
Pt called to follow up on her request. Pt stated that she will be out of town on 04/23/12. Please advise

## 2012-04-15 ENCOUNTER — Encounter: Payer: Self-pay | Admitting: Internal Medicine

## 2012-04-15 ENCOUNTER — Telehealth: Payer: Self-pay | Admitting: Internal Medicine

## 2012-04-15 ENCOUNTER — Ambulatory Visit
Admission: RE | Admit: 2012-04-15 | Discharge: 2012-04-15 | Disposition: A | Discharge status: Home / Self Care | Payer: BC Managed Care – PPO | Source: Ambulatory Visit | Attending: Internal Medicine | Admitting: Internal Medicine

## 2012-04-15 DIAGNOSIS — R1011 Right upper quadrant pain: Secondary | ICD-10-CM

## 2012-04-15 IMAGING — US US ABDOMEN COMPLETE
1 series · 14 of 25 positions shown · non-contrast
Comparison: CT abdomen pelvis of [DATE]

` *RADIOLOGY REPORT*
CLINICAL DATA: Right upper quadrant pain, nausea for 2 weeks

COMPLETE ABDOMINAL ULTRASOUND

[Series 1: us abdomen complete · 0.26mm/px · 14 of 77 slices shown]
[im 1/77]
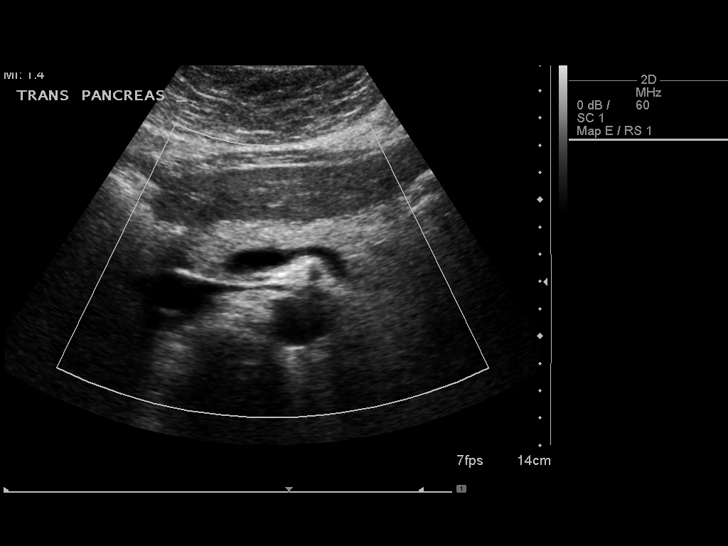
[im 7/77]
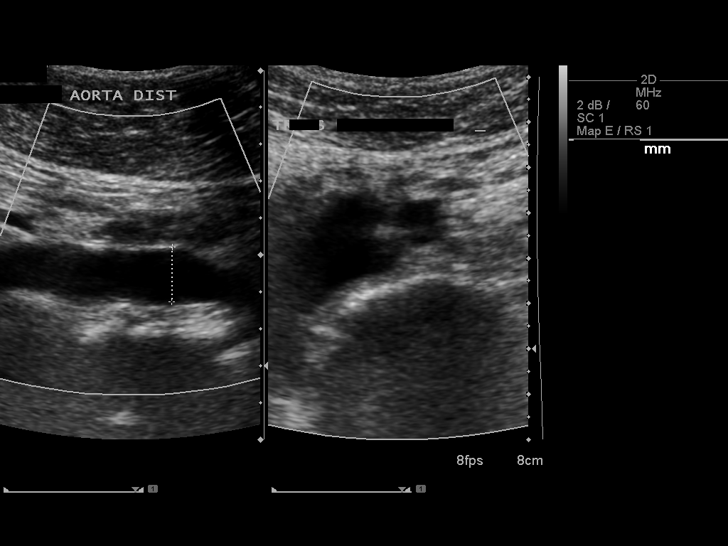
[im 13/77]
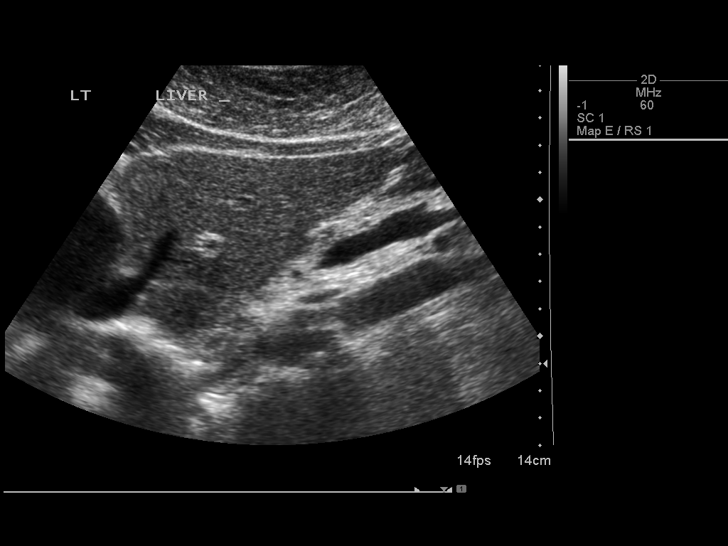
[im 20/77]
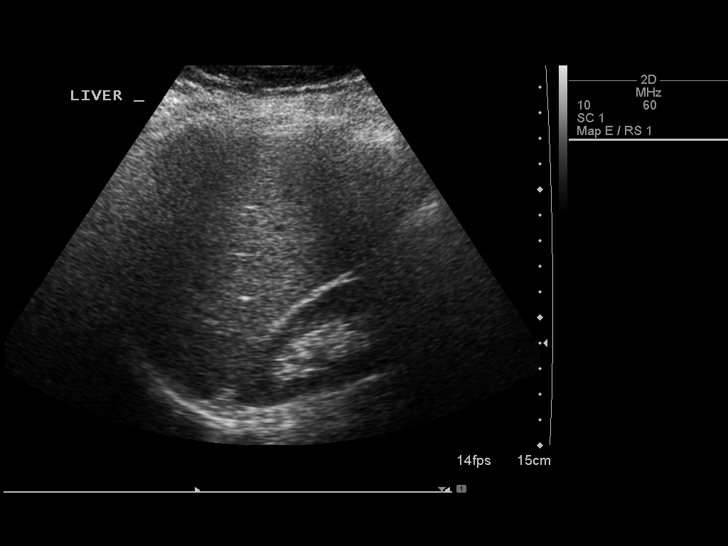
[im 26/77]
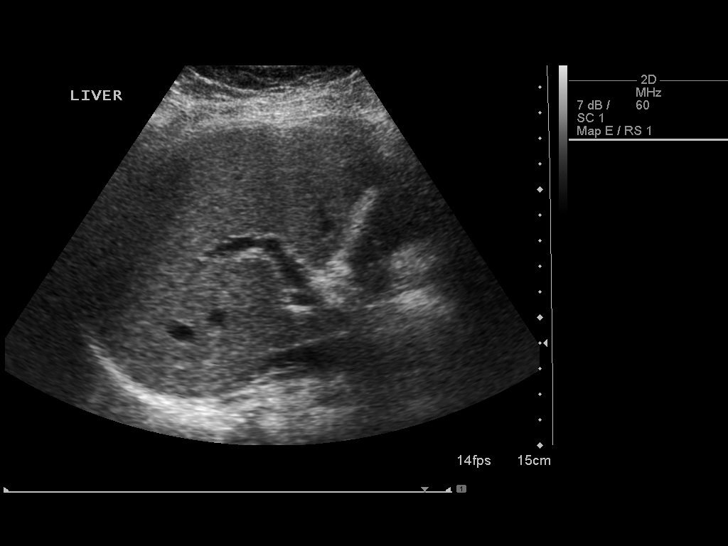
[im 29/77]
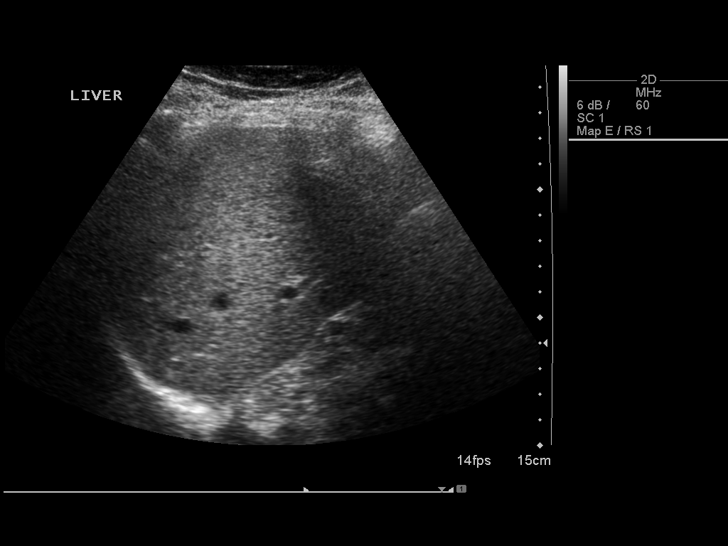
[im 35/77]
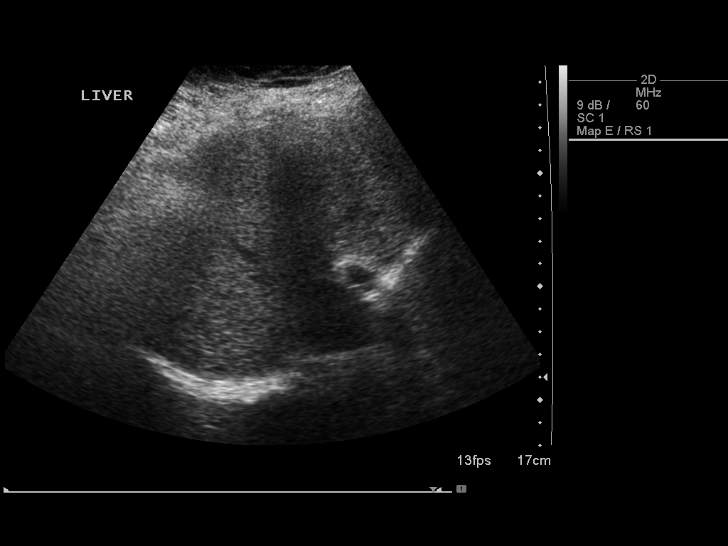
[im 42/77]
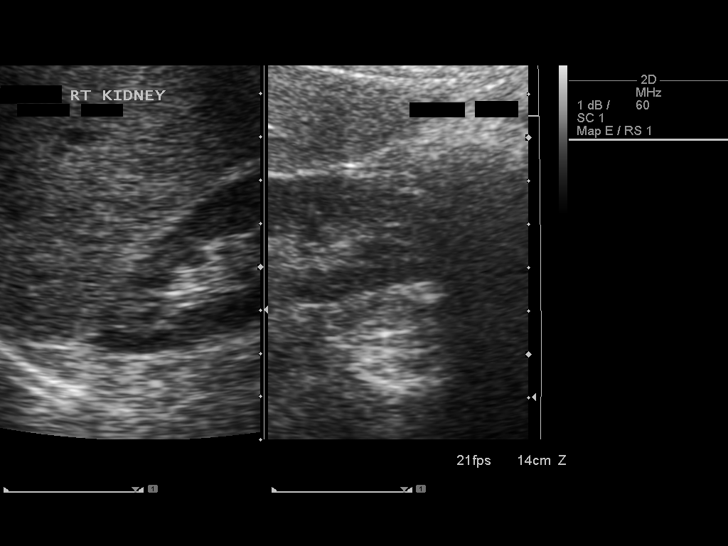
[im 48/77]
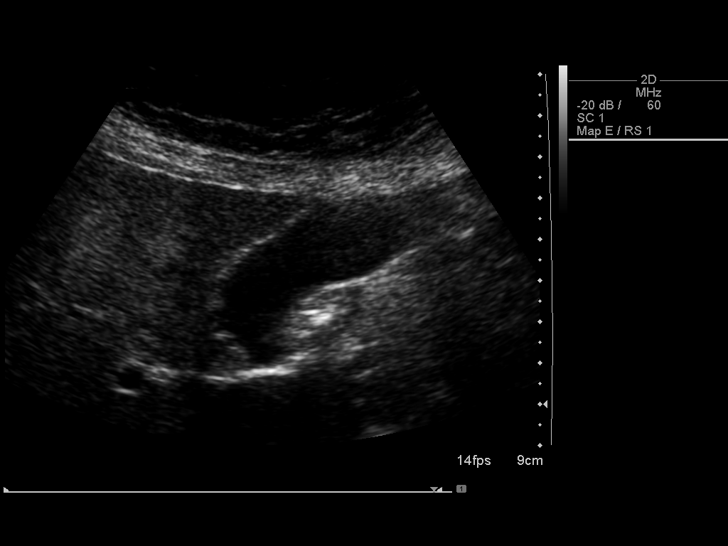
[im 51/77]
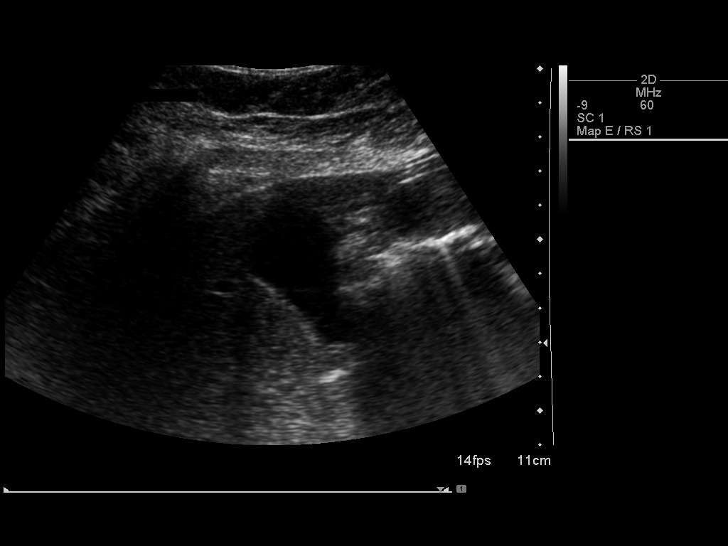
[im 58/77]
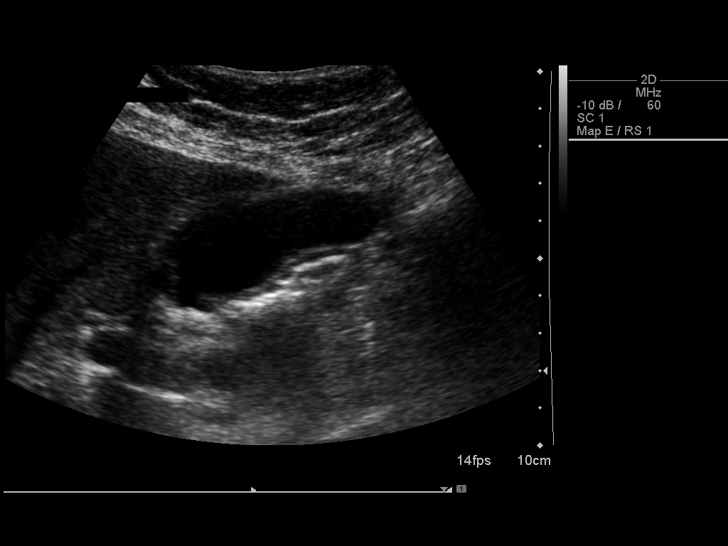
[im 64/77]
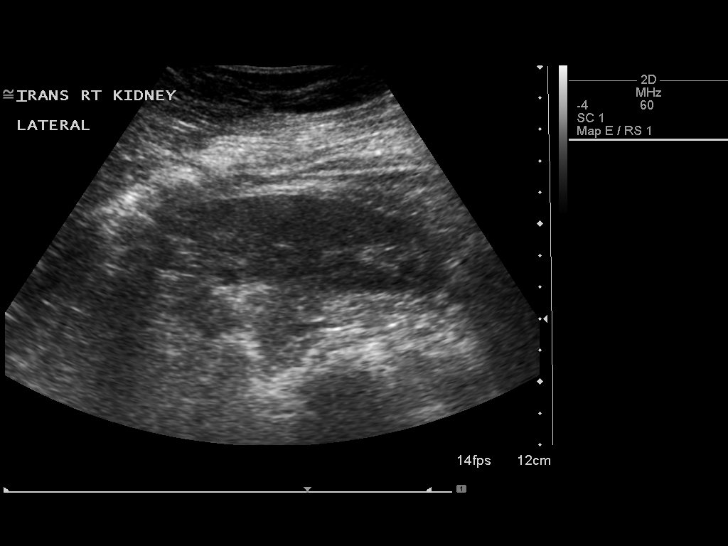
[im 70/77]
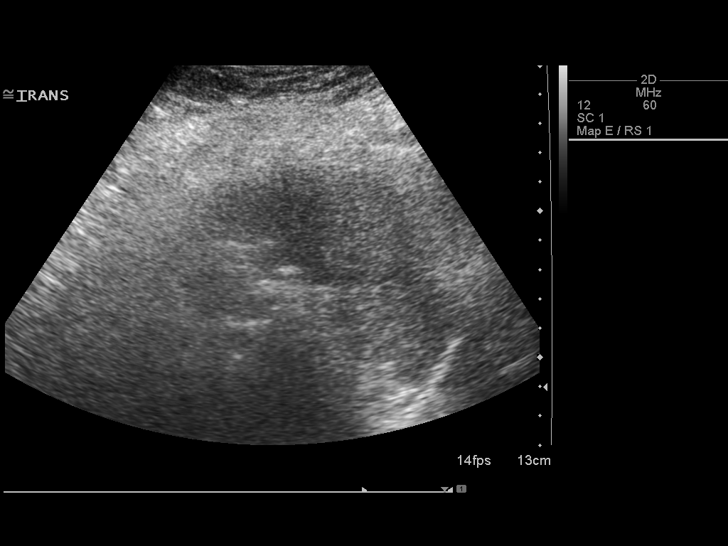
[im 77/77]
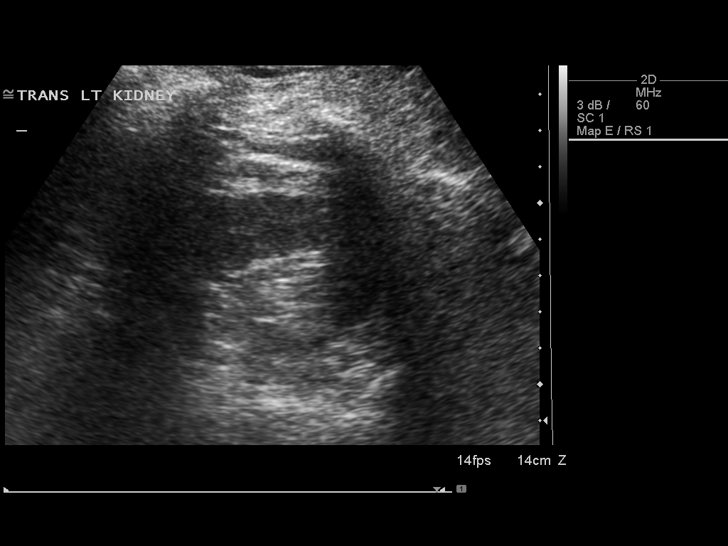

[14 of 25 positions shown; findings below may reference images not displayed]

FINDINGS: Gallbladder:  The gallbladder is visualized and no gallstones are
noted.  There is no pain over the gallbladder with compression.

Common bile duct:  The common bile duct is normal measuring 2.8 mm
in diameter.

Liver:  The liver has a normal echogenic pattern.  No focal
abnormality is seen.

IVC:  Appears normal.

Pancreas:  No focal abnormality seen.

Spleen:  The spleen is normal measuring 6.9 cm sagittally.

Right Kidney:  No hydronephrosis is seen.  The right kidney
measures 11.2 cm sagittally.  A small cyst is noted laterally of
only 9 mm in diameter.

Left Kidney:  No hydronephrosis is noted.  The left kidney measures
11.3 cm.

Abdominal aorta:  The abdominal aorta is normal in caliber.
IMPRESSION: Negative abdominal ultrasound.  No gallstones.  No ductal
dilatation.

## 2012-04-15 NOTE — Telephone Encounter (Signed)
Patient Information:  Caller Name: Necie  Phone: [phone]  Patient: Ellen [name], Ellen [name]  Gender: Female  DOB: [birth year]  Age: 62 Years  PCP: Plotnikov, Alex (Adults only)  Office Follow Up:  Does the office need to follow up with this patient?: Yes  Instructions For The Office: Please review notes.  Patient wants to check with Dr. Posey Rea regarding her Ultrasound results prior to going to ED (for bloody/ tarry stools).  RN Note:  Patient refused to go to ED  Symptoms  Reason For Call & Symptoms: Patient reports she had [place] of Abdomen done 04/15/12.  Requests that results be called to her ASAP.  She reports pain in side, nausea, gas, diarrhea which is not normal for her; has avoided milk products which has not improved symptoms.  Abdominal pain rated at 4-6 of 10. Reports occasional dark tarry  stools and increased frequency of stools with semi-solid consistency.  ED per Abdominal Pain - Upper  protocol due to same.  Reviewed Health History In EMR: Yes  Reviewed Medications In EMR: Yes  Reviewed Allergies In EMR: Yes  Reviewed Surgeries / Procedures: Yes  Date of Onset of Symptoms: Unknown  Guideline(s) Used:  Abdominal Pain - Upper  Disposition Per Guideline:   Go to ED Now  Reason For Disposition Reached:   Blood in bowel movements (black/tarry or red)  Advice Given:  N/A  Patient Refused Recommendation:  Patient Refused Care Advice  Asks to check with Dr. Posey Rea if she can be seen in office.  States she has  plans to take neighbor to chemo treatment today.

## 2012-04-15 NOTE — Telephone Encounter (Signed)
Pt advised that AVP out of office this afternoon. Pt again refused ER as triage and was transferred to schedule appt with another MD per her request.

## 2012-04-16 ENCOUNTER — Encounter: Payer: Self-pay | Admitting: Internal Medicine

## 2012-04-16 ENCOUNTER — Ambulatory Visit (INDEPENDENT_AMBULATORY_CARE_PROVIDER_SITE_OTHER): Payer: BC Managed Care – PPO | Admitting: Internal Medicine

## 2012-04-16 ENCOUNTER — Other Ambulatory Visit (INDEPENDENT_AMBULATORY_CARE_PROVIDER_SITE_OTHER): Payer: BC Managed Care – PPO

## 2012-04-16 VITALS — BP 108/84 | HR 80 | Temp 98.3°F | Resp 16 | Wt 155.0 lb

## 2012-04-16 DIAGNOSIS — R1011 Right upper quadrant pain: Secondary | ICD-10-CM

## 2012-04-16 DIAGNOSIS — R195 Other fecal abnormalities: Secondary | ICD-10-CM

## 2012-04-16 LAB — BASIC METABOLIC PANEL
BUN: 13 mg/dL (ref 6–23)
CO2: 26 mEq/L (ref 19–32)
Calcium: 8.8 mg/dL (ref 8.4–10.5)
Chloride: 103 mEq/L (ref 96–112)
Creatinine, Ser: 0.7 mg/dL (ref 0.4–1.2)
GFR: 92.71 mL/min (ref >60.00)
Glucose, Bld: 95 mg/dL (ref 70–99)
Potassium: 3.8 mEq/L (ref 3.5–5.1)
Sodium: 135 mEq/L (ref 135–145)

## 2012-04-16 LAB — CBC WITH DIFFERENTIAL/PLATELET
Basophils Absolute: 0 10*3/uL (ref 0.0–0.1)
Basophils Relative: 0.8 % (ref 0.0–3.0)
Eosinophils Absolute: 0.4 10*3/uL (ref 0.0–0.7)
Eosinophils Relative: 7.7 % — ABNORMAL HIGH (ref 0.0–5.0)
HCT: 41.5 % (ref 36.0–46.0)
Hemoglobin: 13.9 g/dL (ref 12.0–15.0)
Lymphocytes Relative: 36.8 % (ref 12.0–46.0)
Lymphs Abs: 2.1 10*3/uL (ref 0.7–4.0)
MCHC: 33.5 g/dL (ref 30.0–36.0)
MCV: 91.1 fl (ref 78.0–100.0)
Monocytes Absolute: 0.5 10*3/uL (ref 0.1–1.0)
Monocytes Relative: 8.9 % (ref 3.0–12.0)
Neutro Abs: 2.6 10*3/uL (ref 1.4–7.7)
Neutrophils Relative %: 45.8 % (ref 43.0–77.0)
Platelets: 203 10*3/uL (ref 150.0–400.0)
RBC: 4.55 Mil/uL (ref 3.87–5.11)
RDW: 12.9 % (ref 11.5–14.6)
WBC: 5.6 10*3/uL (ref 4.5–10.5)

## 2012-04-16 LAB — URINALYSIS
Bilirubin Urine: NEGATIVE
Ketones, ur: NEGATIVE
Leukocytes, UA: NEGATIVE
Nitrite: NEGATIVE
Specific Gravity, Urine: 1.015 (ref 1.000–1.030)
Total Protein, Urine: NEGATIVE
Urine Glucose: NEGATIVE
Urobilinogen, UA: 0.2 (ref 0.0–1.0)
pH: 5.5 (ref 5.0–8.0)

## 2012-04-16 MED ORDER — OMEPRAZOLE 40 MG PO CPDR: 40.0000 mg | DELAYED_RELEASE_CAPSULE | Freq: Every day | ORAL | Status: DC

## 2012-04-16 NOTE — Assessment & Plan Note (Signed)
Labs GI cons if needed

## 2012-04-16 NOTE — Progress Notes (Signed)
Subjective:    Abdominal Pain This is a new problem. The current episode started 1 to 4 weeks ago. The onset quality is gradual. The problem occurs 2 to 4 times per day. The problem has been waxing and waning. The pain is located in the RUQ and epigastric region. The pain is at a severity of 6/10. The pain is severe. The quality of the pain is cramping. Associated symptoms include belching, diarrhea and nausea. Pertinent negatives include no arthralgias, constipation, dysuria, fever, frequency, headaches or hematuria. Exacerbated by: hunger. The pain is relieved by eating. She has tried antacids (NSAIDs) for the symptoms. The treatment provided no relief. Her past medical history is significant for PUD. There is no history of abdominal surgery or Crohn's disease.   She has black stools x 10-14 d    BP Readings from Last 3 Encounters:  04/16/12 108/84  01/12/12 128/70  11/18/11 108/80   Wt Readings from Last 3 Encounters:  04/16/12 155 lb (70.308 kg)  01/12/12 156 lb (70.761 kg)  11/18/11 152 lb (68.947 kg)       Review of Systems  Constitutional: Negative.  Negative for fever, chills, diaphoresis, activity change, appetite change, fatigue and unexpected weight change.  HENT: Negative for hearing loss, ear pain, nosebleeds, congestion, sore throat, facial swelling, rhinorrhea, sneezing, mouth sores, trouble swallowing, neck pain, neck stiffness, postnasal drip, sinus pressure and tinnitus.   Eyes: Negative for pain, discharge, redness, itching and visual disturbance.  Respiratory: Negative for cough, chest tightness, shortness of breath, wheezing and stridor.   Cardiovascular: Negative for chest pain, palpitations and leg swelling.  Gastrointestinal: Positive for nausea and diarrhea. Negative for abdominal pain, constipation, blood in stool, abdominal distention, anal bleeding and rectal pain.  Genitourinary: Negative for dysuria, urgency, frequency, hematuria, flank pain (right),  vaginal bleeding, vaginal discharge, difficulty urinating, genital sores, vaginal pain and pelvic pain.  Musculoskeletal: Negative for back pain, joint swelling, arthralgias and gait problem.  Skin: Negative.  Negative for pallor and rash.  Neurological: Negative for dizziness, tremors, seizures, syncope, speech difficulty, weakness, numbness and headaches.  Hematological: Negative for adenopathy. Does not bruise/bleed easily.  Psychiatric/Behavioral: Negative for suicidal ideas, behavioral problems, confusion, sleep disturbance, dysphoric mood and decreased concentration. The patient is not nervous/anxious.        Objective:   Physical Exam  Constitutional: She appears well-developed. No distress.  Obese  HENT:  Head: Normocephalic.  Right Ear: External ear normal.  Left Ear: External ear normal.  Nose: Nose normal.  Mouth/Throat: Oropharynx is clear and moist.  Eyes: Conjunctivae are normal. Pupils are equal, round, and reactive to light. Right eye exhibits no discharge. Left eye exhibits no discharge.  Neck: Normal range of motion. Neck supple. No JVD present. No tracheal deviation present. No thyromegaly present.  Cardiovascular: Normal rate, regular rhythm and normal heart sounds.   Pulmonary/Chest: No stridor. No respiratory distress. She has no wheezes.  Abdominal: Soft. Bowel sounds are normal. She exhibits no distension and no mass. There is no tenderness. There is no rebound and no guarding.  Musculoskeletal: She exhibits no edema and no tenderness.  R lat ankle w/a scar  Lymphadenopathy:    She has no cervical adenopathy.  Neurological: She displays normal reflexes. No cranial nerve deficit. She exhibits normal muscle tone. Coordination normal.  Skin: No rash noted. No erythema.  Psychiatric: She has a normal mood and affect. Her behavior is normal. Judgment and thought content normal.    [place] abd  Assessment & Plan:

## 2012-04-18 ENCOUNTER — Encounter: Payer: Self-pay | Admitting: Internal Medicine

## 2012-04-21 ENCOUNTER — Other Ambulatory Visit (INDEPENDENT_AMBULATORY_CARE_PROVIDER_SITE_OTHER): Payer: BC Managed Care – PPO

## 2012-04-21 ENCOUNTER — Encounter: Payer: Self-pay | Admitting: Internal Medicine

## 2012-04-21 DIAGNOSIS — R195 Other fecal abnormalities: Secondary | ICD-10-CM

## 2012-04-21 DIAGNOSIS — R1011 Right upper quadrant pain: Secondary | ICD-10-CM

## 2012-04-21 LAB — HEMOCCULT SLIDES (X 3 CARDS)
Fecal Occult Blood: NEGATIVE
OCCULT 1: NEGATIVE
OCCULT 2: NEGATIVE
OCCULT 3: NEGATIVE
OCCULT 4: NEGATIVE
OCCULT 5: NEGATIVE

## 2012-04-22 ENCOUNTER — Encounter: Payer: Self-pay | Admitting: Internal Medicine

## 2012-05-19 ENCOUNTER — Encounter: Payer: Self-pay | Admitting: Internal Medicine

## 2012-05-28 ENCOUNTER — Ambulatory Visit: Payer: BC Managed Care – PPO | Admitting: Internal Medicine

## 2012-06-11 ENCOUNTER — Ambulatory Visit (INDEPENDENT_AMBULATORY_CARE_PROVIDER_SITE_OTHER): Payer: BC Managed Care – PPO | Admitting: Internal Medicine

## 2012-06-11 ENCOUNTER — Encounter: Payer: Self-pay | Admitting: Internal Medicine

## 2012-06-11 ENCOUNTER — Ambulatory Visit (INDEPENDENT_AMBULATORY_CARE_PROVIDER_SITE_OTHER)
Admission: RE | Admit: 2012-06-11 | Discharge: 2012-06-11 | Disposition: A | Discharge status: Home / Self Care | Payer: BC Managed Care – PPO | Source: Ambulatory Visit | Attending: Internal Medicine | Admitting: Internal Medicine

## 2012-06-11 VITALS — BP 120/82 | HR 67 | Temp 98.7°F | Resp 16 | Wt 157.0 lb

## 2012-06-11 DIAGNOSIS — J45909 Unspecified asthma, uncomplicated: Secondary | ICD-10-CM | POA: Insufficient documentation

## 2012-06-11 DIAGNOSIS — J019 Acute sinusitis, unspecified: Secondary | ICD-10-CM

## 2012-06-11 DIAGNOSIS — R635 Abnormal weight gain: Secondary | ICD-10-CM

## 2012-06-11 DIAGNOSIS — J45901 Unspecified asthma with (acute) exacerbation: Secondary | ICD-10-CM

## 2012-06-11 DIAGNOSIS — J4521 Mild intermittent asthma with (acute) exacerbation: Secondary | ICD-10-CM

## 2012-06-11 IMAGING — CR DG CHEST 2V
2 series · 2 of 2 positions shown · non-contrast
Comparison: CT chest of [DATE]

CLINICAL DATA: Cough for 2 months, right-sided tenderness, former
smoking history

CHEST - 2 VIEW

[view not recorded (1 of 2)]
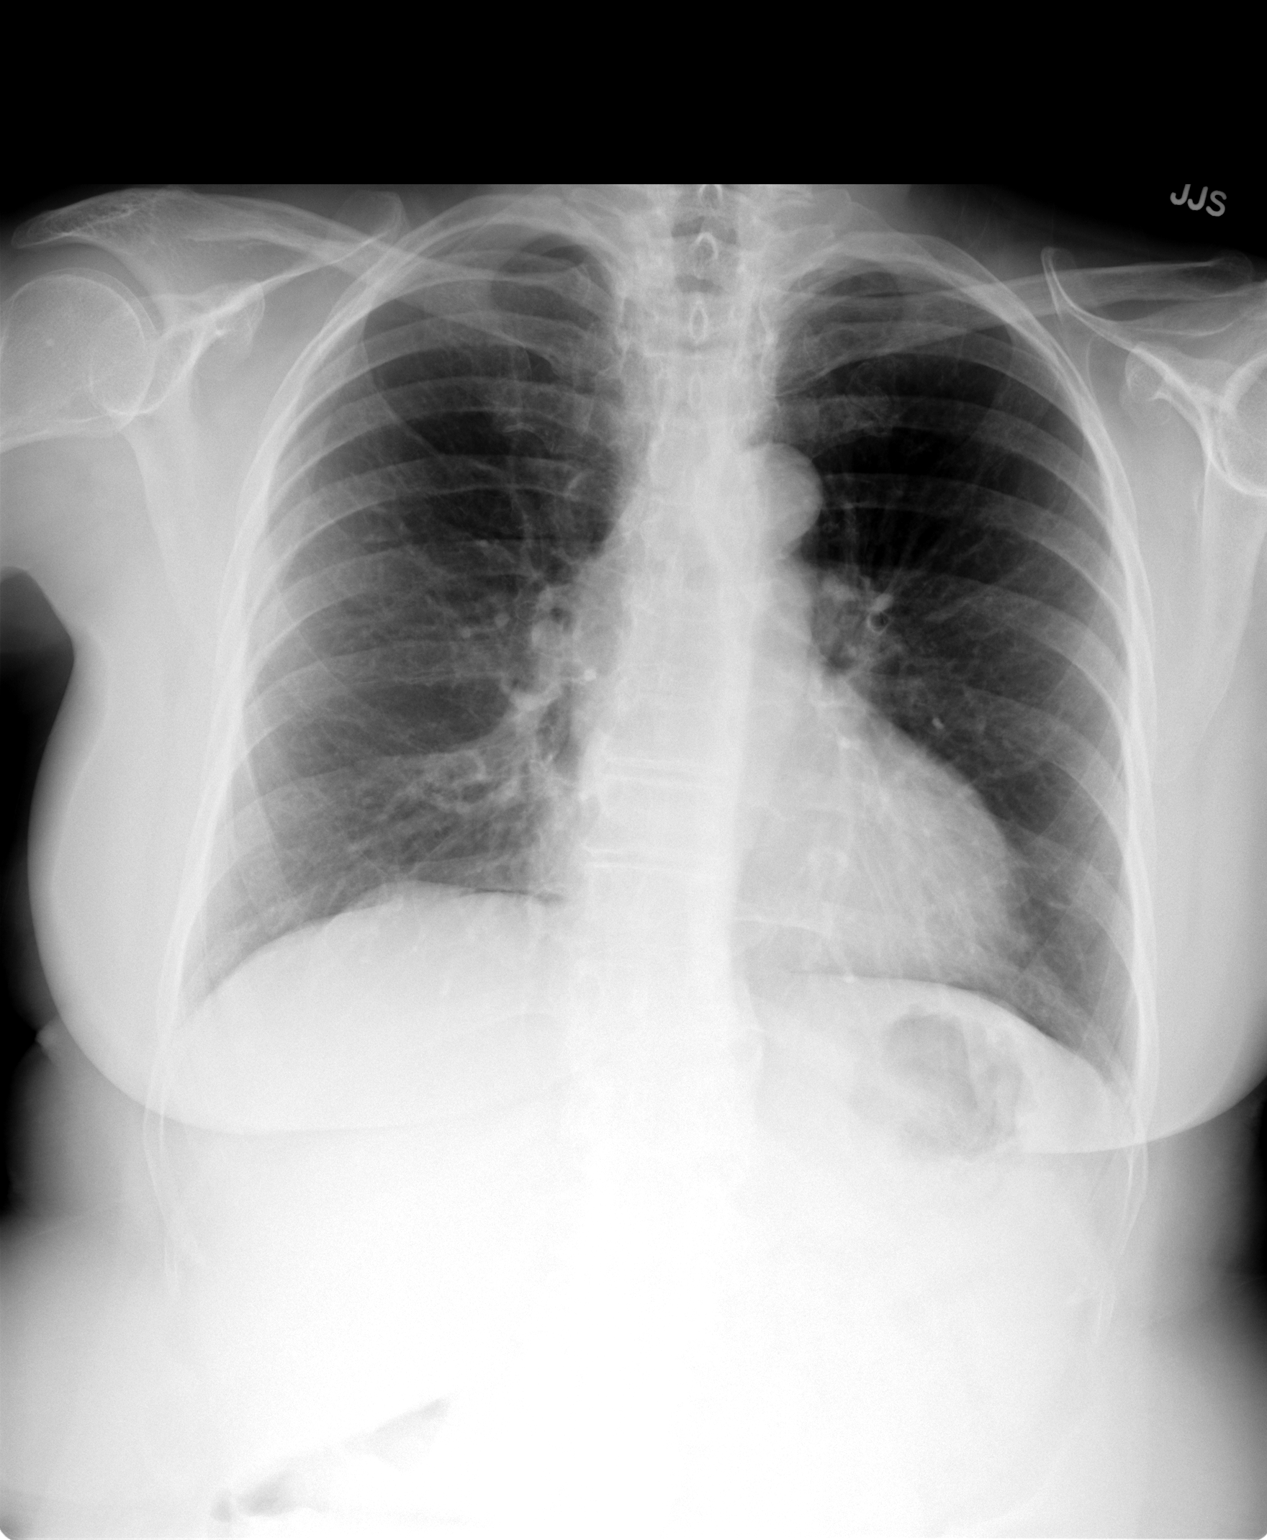

[view not recorded (2 of 2)]
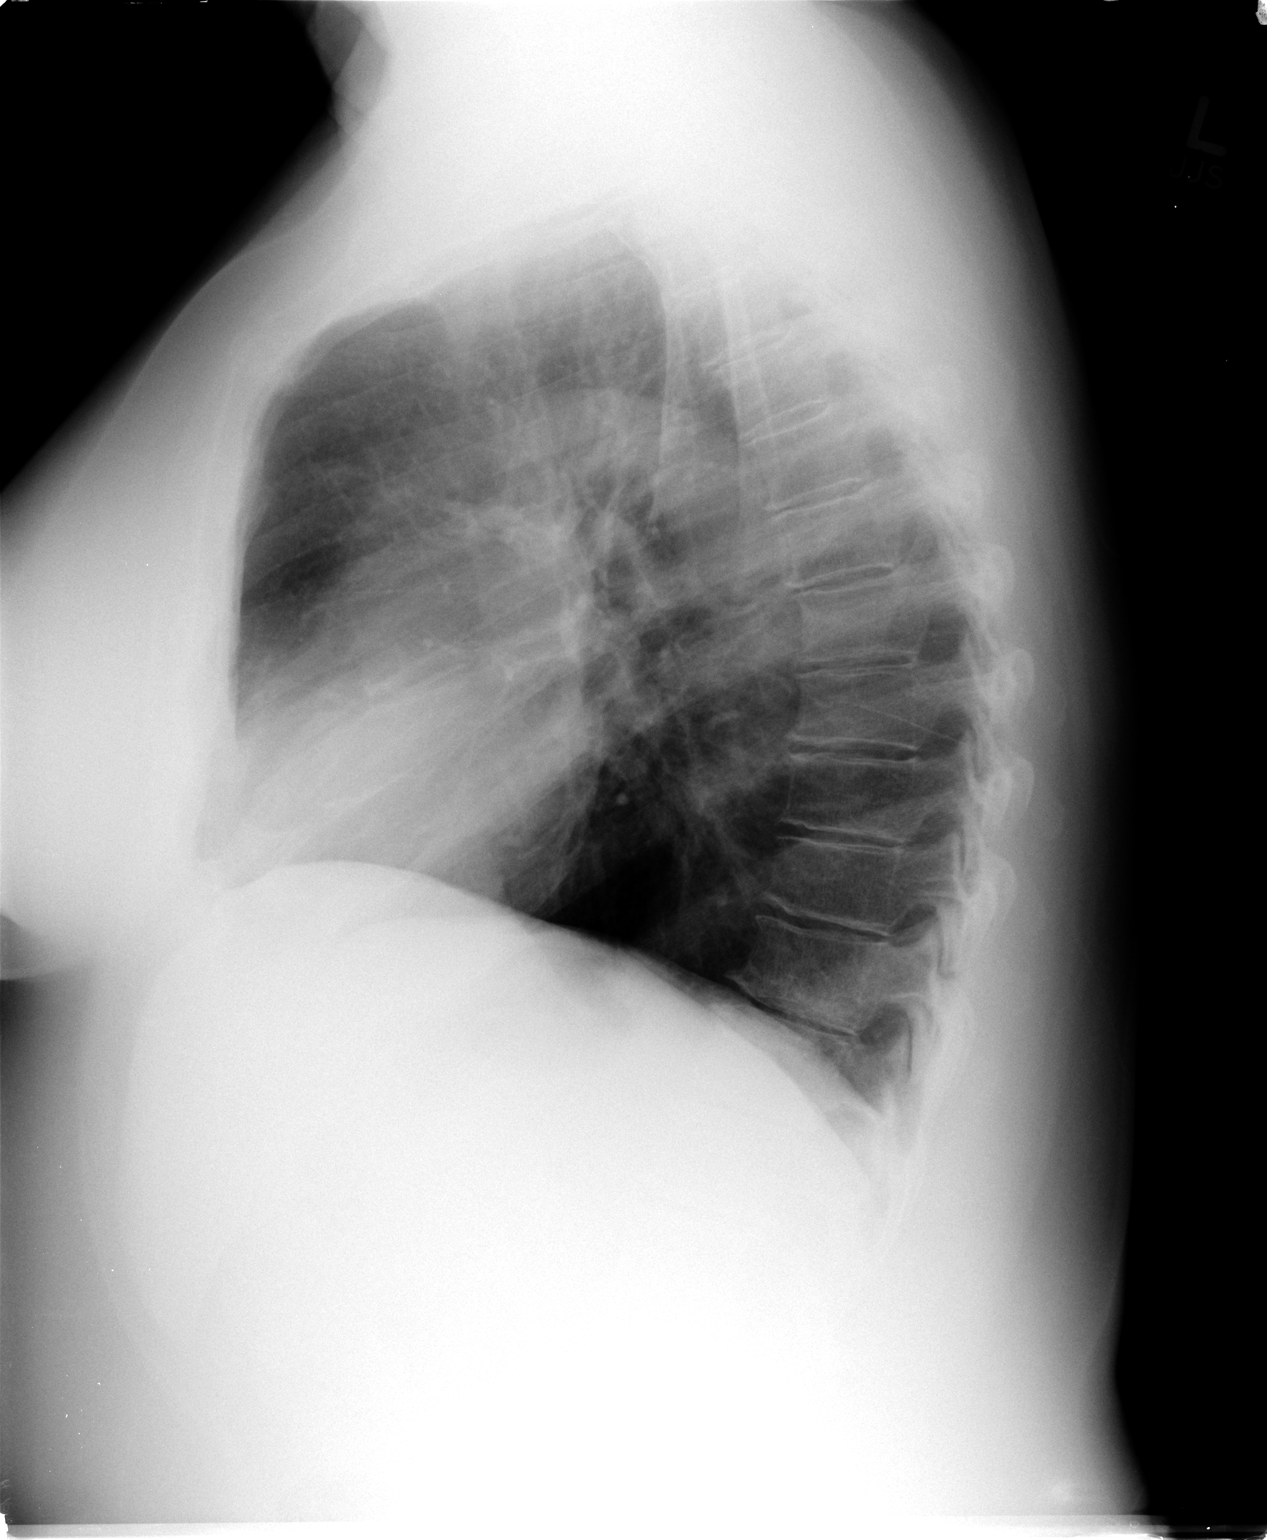

[2 of 2 positions shown; findings below may reference images not displayed]

FINDINGS: No active infiltrate or effusion is seen.  Mediastinal
contours appear normal.  The heart is within upper limits of
normal.  No bony abnormality is seen.
IMPRESSION: No active lung disease.

## 2012-06-11 MED ORDER — MOMETASONE FURO-FORMOTEROL FUM 200-5 MCG/ACT IN AERO: 2.0000 | INHALATION_SPRAY | Freq: Two times a day (BID) | RESPIRATORY_TRACT | Status: DC

## 2012-06-11 MED ORDER — PROMETHAZINE-CODEINE 6.25-10 MG/5ML PO SYRP: 5.0000 mL | ORAL_SOLUTION | ORAL | Status: DC | PRN

## 2012-06-11 MED ORDER — LEVOFLOXACIN 500 MG PO TABS: 500.0000 mg | ORAL_TABLET | Freq: Every day | ORAL | Status: DC

## 2012-06-11 NOTE — Progress Notes (Signed)
  Subjective:    Patient ID: Cedric Fishman, female    DOB: 1950/01/20, 62 y.o.   MRN: [id number]  Cough This is a new problem. The current episode started more than 1 month ago. The problem has been waxing and waning. The problem occurs every few minutes. The cough is productive of brown sputum and productive of purulent sputum. Associated symptoms include chills, rhinorrhea and wheezing.    C/o URI sx's  Review of Systems  Constitutional: Positive for chills.  HENT: Positive for congestion, rhinorrhea and sinus pressure.   Eyes: Negative for pain.  Respiratory: Positive for cough, chest tightness and wheezing.   Neurological: Negative for tremors, weakness and light-headedness.  Psychiatric/Behavioral: Negative for decreased concentration.       Objective:   Physical Exam  Constitutional: She appears well-developed. No distress.  Obese   HENT:  eryth mucosa  Neck: Tracheal deviation present.  Cardiovascular: Normal rate and normal heart sounds.   No murmur heard. Pulmonary/Chest: She has wheezes.  Abdominal: She exhibits no distension. There is no rebound.  Musculoskeletal: She exhibits no edema.  Skin: No erythema. No pallor.     I personally provided [facility] inhaler use teaching. After the teaching patient was able to demonstrate it's use effectively. All questions were answered      Assessment & Plan:

## 2012-06-12 ENCOUNTER — Encounter: Payer: Self-pay | Admitting: Internal Medicine

## 2012-06-15 NOTE — Assessment & Plan Note (Signed)
Dulera bid

## 2012-06-15 NOTE — Assessment & Plan Note (Signed)
Wt Readings from Last 3 Encounters:  06/11/12 157 lb (71.215 kg)  04/16/12 155 lb (70.308 kg)  01/12/12 156 lb (70.761 kg)

## 2012-06-15 NOTE — Assessment & Plan Note (Signed)
Start abx

## 2012-07-22 ENCOUNTER — Other Ambulatory Visit: Payer: Self-pay | Admitting: Internal Medicine

## 2012-07-22 NOTE — Telephone Encounter (Signed)
Called [facility] to inquire if they had received rx for citaloprm. Pharmacy stated that they did not receive rx. Resent rx to pharmacy.

## 2012-07-27 ENCOUNTER — Encounter: Payer: Self-pay | Admitting: Gynecology

## 2012-08-03 ENCOUNTER — Encounter: Payer: Self-pay | Admitting: Internal Medicine

## 2012-08-09 ENCOUNTER — Encounter: Payer: Self-pay | Admitting: Gynecology

## 2012-08-15 ENCOUNTER — Other Ambulatory Visit: Payer: Self-pay | Admitting: Internal Medicine

## 2012-08-19 ENCOUNTER — Ambulatory Visit (INDEPENDENT_AMBULATORY_CARE_PROVIDER_SITE_OTHER): Payer: BC Managed Care – PPO | Admitting: Gynecology

## 2012-08-19 ENCOUNTER — Encounter: Payer: Self-pay | Admitting: Gynecology

## 2012-08-19 VITALS — BP 122/78 | Ht 62.0 in | Wt 144.0 lb

## 2012-08-19 DIAGNOSIS — Z01419 Encounter for gynecological examination (general) (routine) without abnormal findings: Secondary | ICD-10-CM

## 2012-08-19 DIAGNOSIS — N952 Postmenopausal atrophic vaginitis: Secondary | ICD-10-CM

## 2012-08-19 NOTE — Progress Notes (Signed)
DENIM START Feb 19, 1950 [id number]        62 y.o.  [id number] for annual exam.  Doing well without complaints.  Past medical history,surgical history, medications, allergies, family history and social history were all reviewed and documented in the EPIC chart.  ROS:  Performed and pertinent positives and negatives are included in the history, assessment and plan .  Exam: Kim assistant Filed Vitals:   08/19/12 0927  BP: 122/78  Height: 5\' 2"  (1.575 m)  Weight: 144 lb (65.318 kg)   General appearance  Normal Skin grossly normal Head/Neck normal with no cervical or supraclavicular adenopathy thyroid normal Lungs  clear Cardiac RR, without RMG Abdominal  soft, nontender, without masses, organomegaly or hernia Breasts  examined lying and sitting without masses, retractions, discharge or axillary adenopathy. Well-healed bilateral reduction scars. Pelvic  Ext/BUS/vagina  normal with mild atrophic changes   Adnexa  Without masses or tenderness    Anus and perineum  normal   Rectovaginal  normal sphincter tone without palpated masses or tenderness.    Assessment/Plan:  62 y.o. [id number] female for annual exam, status post TVH 1991 for irregular bleeding.   1. Postmenopausal/mild atrophic changes. Patient doing well without significant symptoms of hot flushes, night sweats, vaginal dryness or dyspareunia. We'll continue observation. 2. Pap smear 2012. No Pap smear done today. No history of abnormal Pap smears previously. Status post TVH for irregular bleeding. Options to stop screening altogether or less frequent screening intervals reviewed. We'll readdress on an annual basis. 3. Mammography 07/2012. Continue with annual mammography. SBE monthly reviewed. 4. DEXA 2012 normal. Plan repeat DEXA at five-year interval. Increase calcium vitamin D reviewed. 5. Colonoscopy 2011. Repeat at their recommended interval. 6. Health maintenance. No blood work done as it is all done through her primary  physician's office. Followup one year, sooner as needed.  Note: This document was prepared with digital dictation and possible smart phrase technology. Any transcriptional errors that result from this process are unintentional.   Dara Lords MD, 10:00 AM 08/19/2012

## 2012-08-19 NOTE — Patient Instructions (Signed)
Follow up in one year, sooner as needed. 

## 2012-10-07 ENCOUNTER — Ambulatory Visit (INDEPENDENT_AMBULATORY_CARE_PROVIDER_SITE_OTHER): Payer: BC Managed Care – PPO | Admitting: *Deleted

## 2012-10-07 DIAGNOSIS — Z23 Encounter for immunization: Secondary | ICD-10-CM

## 2012-10-08 ENCOUNTER — Ambulatory Visit (INDEPENDENT_AMBULATORY_CARE_PROVIDER_SITE_OTHER): Payer: BC Managed Care – PPO | Admitting: Internal Medicine

## 2012-10-08 ENCOUNTER — Other Ambulatory Visit (INDEPENDENT_AMBULATORY_CARE_PROVIDER_SITE_OTHER): Payer: BC Managed Care – PPO

## 2012-10-08 ENCOUNTER — Encounter: Payer: Self-pay | Admitting: Internal Medicine

## 2012-10-08 VITALS — BP 138/82 | HR 64 | Temp 97.1°F | Resp 12 | Wt 161.0 lb

## 2012-10-08 DIAGNOSIS — R635 Abnormal weight gain: Secondary | ICD-10-CM

## 2012-10-08 DIAGNOSIS — N390 Urinary tract infection, site not specified: Secondary | ICD-10-CM

## 2012-10-08 DIAGNOSIS — F3289 Other specified depressive episodes: Secondary | ICD-10-CM

## 2012-10-08 DIAGNOSIS — F329 Major depressive disorder, single episode, unspecified: Secondary | ICD-10-CM

## 2012-10-08 LAB — URINALYSIS
Bilirubin Urine: NEGATIVE
Hgb urine dipstick: NEGATIVE
Ketones, ur: NEGATIVE
Leukocytes, UA: NEGATIVE
Nitrite: NEGATIVE
Specific Gravity, Urine: <=1.005 (ref 1.000–1.030)
Total Protein, Urine: NEGATIVE
Urine Glucose: NEGATIVE
Urobilinogen, UA: 0.2 (ref 0.0–1.0)
pH: 6 (ref 5.0–8.0)

## 2012-10-08 MED ORDER — LORCASERIN HCL 10 MG PO TABS: 1.0000 | ORAL_TABLET | Freq: Two times a day (BID) | ORAL | Status: DC

## 2012-10-08 NOTE — Progress Notes (Signed)
   Subjective:    HPI  C/o pos UTI Ellen [name] wants to have hepatitis shots - working w/homeless  BP Readings from Last 3 Encounters:  10/08/12 138/82  08/19/12 122/78  06/11/12 120/82   Wt Readings from Last 3 Encounters:  10/08/12 161 lb (73.029 kg)  08/19/12 144 lb (65.318 kg)  06/11/12 157 lb (71.215 kg)       Review of Systems  Constitutional: Negative.  Negative for fever, chills, diaphoresis, activity change, appetite change, fatigue and unexpected weight change.  HENT: Negative for hearing loss, ear pain, nosebleeds, congestion, sore throat, facial swelling, rhinorrhea, sneezing, mouth sores, trouble swallowing, neck pain, neck stiffness, postnasal drip, sinus pressure and tinnitus.   Eyes: Negative for pain, discharge, redness, itching and visual disturbance.  Respiratory: Negative for cough, chest tightness, shortness of breath, wheezing and stridor.   Cardiovascular: Negative for chest pain, palpitations and leg swelling.  Gastrointestinal: Negative for nausea, abdominal pain, diarrhea, constipation, blood in stool, abdominal distention, anal bleeding and rectal pain.  Genitourinary: Negative for dysuria, urgency, frequency, hematuria, flank pain (right), vaginal bleeding, vaginal discharge, difficulty urinating, genital sores, vaginal pain and pelvic pain.  Musculoskeletal: Negative for back pain, joint swelling, arthralgias and gait problem.  Skin: Negative.  Negative for pallor and rash.  Neurological: Negative for dizziness, tremors, seizures, syncope, speech difficulty, weakness, numbness and headaches.  Hematological: Negative for adenopathy. Does not bruise/bleed easily.  Psychiatric/Behavioral: Negative for suicidal ideas, behavioral problems, confusion, sleep disturbance, dysphoric mood and decreased concentration. The patient is not nervous/anxious.        Objective:   Physical Exam  Constitutional: She appears well-developed. No distress.  Obese  HENT:   Head: Normocephalic.  Right Ear: External ear normal.  Left Ear: External ear normal.  Nose: Nose normal.  Mouth/Throat: Oropharynx is clear and moist.  Eyes: Conjunctivae are normal. Pupils are equal, round, and reactive to light. Right eye exhibits no discharge. Left eye exhibits no discharge.  Neck: Normal range of motion. Neck supple. No JVD present. No tracheal deviation present. No thyromegaly present.  Cardiovascular: Normal rate, regular rhythm and normal heart sounds.   Pulmonary/Chest: No stridor. No respiratory distress. She has no wheezes.  Abdominal: Soft. Bowel sounds are normal. She exhibits no distension and no mass. There is no tenderness. There is no rebound and no guarding.  Musculoskeletal: She exhibits no edema and no tenderness.  R lat ankle w/a scar  Lymphadenopathy:    She has no cervical adenopathy.  Neurological: She displays normal reflexes. No cranial nerve deficit. She exhibits normal muscle tone. Coordination normal.  Skin: No rash noted. No erythema.  Psychiatric: She has a normal mood and affect. Her behavior is normal. Judgment and thought content normal.   EKG OK        Assessment & Plan:

## 2012-10-08 NOTE — Assessment & Plan Note (Signed)
Wt Readings from Last 3 Encounters:  10/08/12 161 lb (73.029 kg)  08/19/12 144 lb (65.318 kg)  06/11/12 157 lb (71.215 kg)   Belviq discussed

## 2012-10-08 NOTE — Assessment & Plan Note (Signed)
UA

## 2012-10-10 ENCOUNTER — Encounter: Payer: Self-pay | Admitting: Internal Medicine

## 2012-10-10 DIAGNOSIS — Z7721 Contact with and (suspected) exposure to potentially hazardous body fluids: Secondary | ICD-10-CM

## 2012-10-10 NOTE — Assessment & Plan Note (Signed)
Continue with current prescription therapy as reflected on the Med list.  

## 2012-10-12 ENCOUNTER — Encounter: Payer: Self-pay | Admitting: Internal Medicine

## 2012-10-18 ENCOUNTER — Encounter: Payer: Self-pay | Admitting: Internal Medicine

## 2012-10-19 ENCOUNTER — Other Ambulatory Visit: Payer: Self-pay | Admitting: Internal Medicine

## 2012-10-27 ENCOUNTER — Ambulatory Visit: Payer: BC Managed Care – PPO

## 2012-10-28 ENCOUNTER — Ambulatory Visit (INDEPENDENT_AMBULATORY_CARE_PROVIDER_SITE_OTHER): Payer: BC Managed Care – PPO | Admitting: *Deleted

## 2012-10-28 DIAGNOSIS — Z23 Encounter for immunization: Secondary | ICD-10-CM

## 2012-10-29 ENCOUNTER — Other Ambulatory Visit: Payer: BC Managed Care – PPO

## 2012-10-29 DIAGNOSIS — Z7721 Contact with and (suspected) exposure to potentially hazardous body fluids: Secondary | ICD-10-CM

## 2012-10-29 LAB — HEPATITIS C ANTIBODY: HCV Ab: NEGATIVE

## 2012-10-30 ENCOUNTER — Other Ambulatory Visit: Payer: Self-pay | Admitting: Internal Medicine

## 2012-11-02 ENCOUNTER — Encounter: Payer: Self-pay | Admitting: Internal Medicine

## 2012-11-03 ENCOUNTER — Encounter: Payer: Self-pay | Admitting: Internal Medicine

## 2012-11-05 ENCOUNTER — Other Ambulatory Visit: Payer: Self-pay | Admitting: Internal Medicine

## 2012-11-05 MED ORDER — LEVOTHYROXINE SODIUM 50 MCG PO TABS
25.0000 ug | ORAL_TABLET | Freq: Every day | ORAL | Status: DC
Start: 2012-11-05 — End: 2013-09-09

## 2012-11-21 ENCOUNTER — Other Ambulatory Visit: Payer: Self-pay | Admitting: Internal Medicine

## 2012-11-26 ENCOUNTER — Ambulatory Visit (INDEPENDENT_AMBULATORY_CARE_PROVIDER_SITE_OTHER): Payer: BC Managed Care – PPO

## 2012-11-26 DIAGNOSIS — Z23 Encounter for immunization: Secondary | ICD-10-CM

## 2012-12-08 ENCOUNTER — Ambulatory Visit (INDEPENDENT_AMBULATORY_CARE_PROVIDER_SITE_OTHER): Payer: BC Managed Care – PPO | Admitting: Internal Medicine

## 2012-12-08 ENCOUNTER — Other Ambulatory Visit (INDEPENDENT_AMBULATORY_CARE_PROVIDER_SITE_OTHER): Payer: BC Managed Care – PPO

## 2012-12-08 ENCOUNTER — Encounter: Payer: Self-pay | Admitting: Internal Medicine

## 2012-12-08 VITALS — BP 128/76 | HR 80 | Temp 98.2°F | Resp 16 | Ht 63.0 in | Wt 164.0 lb

## 2012-12-08 DIAGNOSIS — Z0000 Encounter for general adult medical examination without abnormal findings: Secondary | ICD-10-CM

## 2012-12-08 DIAGNOSIS — Z23 Encounter for immunization: Secondary | ICD-10-CM

## 2012-12-08 DIAGNOSIS — I10 Essential (primary) hypertension: Secondary | ICD-10-CM

## 2012-12-08 LAB — URINALYSIS
Bilirubin Urine: NEGATIVE
Hgb urine dipstick: NEGATIVE
Ketones, ur: NEGATIVE
Leukocytes, UA: NEGATIVE
Nitrite: NEGATIVE
Specific Gravity, Urine: 1.01 (ref 1.000–1.030)
Total Protein, Urine: NEGATIVE
Urine Glucose: NEGATIVE
Urobilinogen, UA: 0.2 (ref 0.0–1.0)
pH: 7.5 (ref 5.0–8.0)

## 2012-12-08 LAB — CBC WITH DIFFERENTIAL/PLATELET
Basophils Absolute: 0 10*3/uL (ref 0.0–0.1)
Basophils Relative: 0.7 % (ref 0.0–3.0)
Eosinophils Absolute: 0.6 10*3/uL (ref 0.0–0.7)
Eosinophils Relative: 9.5 % — ABNORMAL HIGH (ref 0.0–5.0)
HCT: 39.6 % (ref 36.0–46.0)
Hemoglobin: 13.4 g/dL (ref 12.0–15.0)
Lymphocytes Relative: 36.4 % (ref 12.0–46.0)
Lymphs Abs: 2.3 10*3/uL (ref 0.7–4.0)
MCHC: 33.8 g/dL (ref 30.0–36.0)
MCV: 90.2 fl (ref 78.0–100.0)
Monocytes Absolute: 0.5 10*3/uL (ref 0.1–1.0)
Monocytes Relative: 7.7 % (ref 3.0–12.0)
Neutro Abs: 2.8 10*3/uL (ref 1.4–7.7)
Neutrophils Relative %: 45.7 % (ref 43.0–77.0)
Platelets: 192 10*3/uL (ref 150.0–400.0)
RBC: 4.39 Mil/uL (ref 3.87–5.11)
RDW: 13.5 % (ref 11.5–14.6)
WBC: 6.2 10*3/uL (ref 4.5–10.5)

## 2012-12-08 LAB — BASIC METABOLIC PANEL
BUN: 12 mg/dL (ref 6–23)
CO2: 28 mEq/L (ref 19–32)
Calcium: 9 mg/dL (ref 8.4–10.5)
Chloride: 105 mEq/L (ref 96–112)
Creatinine, Ser: 0.7 mg/dL (ref 0.4–1.2)
GFR: 95.76 mL/min (ref >60.00)
Glucose, Bld: 92 mg/dL (ref 70–99)
Potassium: 4.2 mEq/L (ref 3.5–5.1)
Sodium: 140 mEq/L (ref 135–145)

## 2012-12-08 LAB — HEPATIC FUNCTION PANEL
ALT: 23 U/L (ref 0–35)
AST: 27 U/L (ref 0–37)
Albumin: 4 g/dL (ref 3.5–5.2)
Alkaline Phosphatase: 73 U/L (ref 39–117)
Bilirubin, Direct: 0.1 mg/dL (ref 0.0–0.3)
Total Bilirubin: 0.8 mg/dL (ref 0.3–1.2)
Total Protein: 7.1 g/dL (ref 6.0–8.3)

## 2012-12-08 LAB — TSH: TSH: 3.11 u[IU]/mL (ref 0.35–5.50)

## 2012-12-08 NOTE — Progress Notes (Signed)
Pre visit review using our clinic review tool, if applicable. No additional management support is needed unless otherwise documented below in the visit note. 

## 2012-12-08 NOTE — Patient Instructions (Signed)
   Milk free trial (no milk, ice cream, cheese and yogurt) for 4-6 weeks. OK to use almond, coconut, rice or soy milk. "Almond breeze" brand tastes good.  

## 2012-12-08 NOTE — Assessment & Plan Note (Signed)
Continue with current prescription therapy as reflected on the Med list.  

## 2012-12-08 NOTE — Progress Notes (Signed)
Subjective:    HPI   The patient is here for a wellness exam. The patient has been doing well overall without major physical or psychological issues going on lately.   BP Readings from Last 3 Encounters:  12/08/12 128/76  10/08/12 138/82  08/19/12 122/78   Wt Readings from Last 3 Encounters:  12/08/12 164 lb (74.39 kg)  10/08/12 161 lb (73.029 kg)  08/19/12 144 lb (65.318 kg)       Review of Systems  Constitutional: Negative.  Negative for fever, chills, diaphoresis, activity change, appetite change, fatigue and unexpected weight change.  HENT: Negative for congestion, ear pain, facial swelling, hearing loss, mouth sores, nosebleeds, postnasal drip, rhinorrhea, sinus pressure, sneezing, sore throat, tinnitus and trouble swallowing.   Eyes: Negative for pain, discharge, redness, itching and visual disturbance.  Respiratory: Negative for cough, chest tightness, shortness of breath, wheezing and stridor.   Cardiovascular: Negative for chest pain, palpitations and leg swelling.  Gastrointestinal: Negative for nausea, abdominal pain, diarrhea, constipation, blood in stool, abdominal distention, anal bleeding and rectal pain.  Genitourinary: Negative for dysuria, urgency, frequency, hematuria, flank pain (right), vaginal bleeding, vaginal discharge, difficulty urinating, genital sores, vaginal pain and pelvic pain.  Musculoskeletal: Negative for arthralgias, back pain, gait problem, joint swelling, neck pain and neck stiffness.  Skin: Negative.  Negative for pallor and rash.  Neurological: Negative for dizziness, tremors, seizures, syncope, speech difficulty, weakness, numbness and headaches.  Hematological: Negative for adenopathy. Does not bruise/bleed easily.  Psychiatric/Behavioral: Negative for suicidal ideas, behavioral problems, confusion, sleep disturbance, dysphoric mood and decreased concentration. The patient is not nervous/anxious.        Objective:   Physical Exam   Constitutional: She appears well-developed. No distress.  Obese  HENT:  Head: Normocephalic.  Right Ear: External ear normal.  Left Ear: External ear normal.  Nose: Nose normal.  Mouth/Throat: Oropharynx is clear and moist.  Eyes: Conjunctivae are normal. Pupils are equal, round, and reactive to light. Right eye exhibits no discharge. Left eye exhibits no discharge.  Neck: Normal range of motion. Neck supple. No JVD present. No tracheal deviation present. No thyromegaly present.  Cardiovascular: Normal rate, regular rhythm and normal heart sounds.   Pulmonary/Chest: No stridor. No respiratory distress. She has no wheezes.  Abdominal: Soft. Bowel sounds are normal. She exhibits no distension and no mass. There is no tenderness. There is no rebound and no guarding.  Musculoskeletal: She exhibits no edema and no tenderness.  R lat ankle w/a scar  Lymphadenopathy:    She has no cervical adenopathy.  Neurological: She displays normal reflexes. No cranial nerve deficit. She exhibits normal muscle tone. Coordination normal.  Skin: No rash noted. No erythema.  Psychiatric: She has a normal mood and affect. Her behavior is normal. Judgment and thought content normal.   Lab Results  Component Value Date   WBC 5.6 04/16/2012   HGB 13.9 04/16/2012   HCT 41.5 04/16/2012   PLT 203.0 04/16/2012   GLUCOSE 95 04/16/2012   CHOL 249* 07/09/2011   TRIG 112.0 07/09/2011   HDL 76.20 07/09/2011   LDLDIRECT 142.6 07/09/2011   LDLCALC 105* 10/12/2006   ALT 20 07/09/2011   AST 24 07/09/2011   NA 135 04/16/2012   K 3.8 04/16/2012   CL 103 04/16/2012   CREATININE 0.7 04/16/2012   BUN 13 04/16/2012   CO2 26 04/16/2012   TSH 2.69 07/09/2011   HGBA1C 5.6 10/12/2006           Assessment &  Plan:

## 2012-12-08 NOTE — Assessment & Plan Note (Signed)
We discussed age appropriate health related issues, including available/recomended screening tests and vaccinations. We discussed a need for adhering to healthy diet and exercise. Labs/EKG were reviewed/ordered. All questions were answered.   

## 2013-01-06 ENCOUNTER — Encounter: Payer: Self-pay | Admitting: Internal Medicine

## 2013-01-07 ENCOUNTER — Encounter: Payer: Self-pay | Admitting: Internal Medicine

## 2013-01-11 ENCOUNTER — Ambulatory Visit: Payer: BC Managed Care – PPO | Admitting: Internal Medicine

## 2013-01-12 ENCOUNTER — Ambulatory Visit: Payer: BC Managed Care – PPO | Admitting: Internal Medicine

## 2013-02-01 ENCOUNTER — Other Ambulatory Visit: Payer: Self-pay | Admitting: *Deleted

## 2013-02-01 MED ORDER — OMEPRAZOLE 40 MG PO CPDR: 40.0000 mg | DELAYED_RELEASE_CAPSULE | Freq: Every day | ORAL | Status: DC

## 2013-02-06 ENCOUNTER — Other Ambulatory Visit: Payer: Self-pay | Admitting: Internal Medicine

## 2013-04-29 ENCOUNTER — Ambulatory Visit: Payer: BC Managed Care – PPO

## 2013-05-01 ENCOUNTER — Other Ambulatory Visit: Payer: Self-pay | Admitting: Internal Medicine

## 2013-05-02 ENCOUNTER — Ambulatory Visit: Payer: BC Managed Care – PPO

## 2013-05-03 ENCOUNTER — Other Ambulatory Visit: Payer: Self-pay | Admitting: Internal Medicine

## 2013-05-04 ENCOUNTER — Other Ambulatory Visit: Payer: Self-pay | Admitting: Internal Medicine

## 2013-05-04 ENCOUNTER — Encounter: Payer: Self-pay | Admitting: Internal Medicine

## 2013-05-06 ENCOUNTER — Other Ambulatory Visit: Payer: Self-pay | Admitting: Internal Medicine

## 2013-05-06 MED ORDER — PROMETHAZINE-CODEINE 6.25-10 MG/5ML PO SYRP: 5.0000 mL | ORAL_SOLUTION | ORAL | Status: DC | PRN

## 2013-05-10 ENCOUNTER — Ambulatory Visit: Payer: BC Managed Care – PPO

## 2013-05-13 ENCOUNTER — Other Ambulatory Visit: Payer: Self-pay | Admitting: Internal Medicine

## 2013-05-14 ENCOUNTER — Encounter: Payer: Self-pay | Admitting: Internal Medicine

## 2013-05-23 ENCOUNTER — Ambulatory Visit (INDEPENDENT_AMBULATORY_CARE_PROVIDER_SITE_OTHER): Payer: BC Managed Care – PPO | Admitting: Internal Medicine

## 2013-05-23 ENCOUNTER — Encounter: Payer: Self-pay | Admitting: Internal Medicine

## 2013-05-23 VITALS — BP 158/108 | HR 80 | Temp 98.3°F | Resp 16

## 2013-05-23 DIAGNOSIS — J45909 Unspecified asthma, uncomplicated: Secondary | ICD-10-CM

## 2013-05-23 MED ORDER — AZITHROMYCIN 250 MG PO TABS: ORAL_TABLET | ORAL | Status: DC

## 2013-05-23 MED ORDER — FLUTICASONE FUROATE-VILANTEROL 100-25 MCG/INH IN AEPB: 1.0000 | INHALATION_SPRAY | Freq: Every day | RESPIRATORY_TRACT | Status: DC

## 2013-05-23 NOTE — Progress Notes (Signed)
Pre visit review using our clinic review tool, if applicable. No additional management support is needed unless otherwise documented below in the visit note. 

## 2013-05-23 NOTE — Assessment & Plan Note (Addendum)
D/c Dulera  Start Breo Zpac Declined Depomedrol

## 2013-05-23 NOTE — Progress Notes (Signed)
  Subjective:    Cough This is a new problem. The current episode started in the past 7 days. The problem has been waxing and waning. The problem occurs every few minutes. The cough is productive of brown sputum and productive of purulent sputum. Associated symptoms include chills, rhinorrhea and wheezing.      Review of Systems  Constitutional: Positive for chills.  HENT: Positive for congestion, rhinorrhea and sinus pressure.   Eyes: Negative for pain.  Respiratory: Positive for cough, chest tightness and wheezing.   Neurological: Negative for tremors, weakness and light-headedness.  Psychiatric/Behavioral: Negative for decreased concentration.       Objective:   Physical Exam  Constitutional: She appears well-developed. No distress.  Obese   HENT:  eryth mucosa  Neck: Tracheal deviation present.  Cardiovascular: Normal rate and normal heart sounds.   No murmur heard. Pulmonary/Chest: She has wheezes.  Abdominal: She exhibits no distension. There is no rebound.  Musculoskeletal: She exhibits no edema.  Skin: No erythema. No pallor.       Assessment & Plan:

## 2013-05-24 ENCOUNTER — Ambulatory Visit: Payer: BC Managed Care – PPO | Admitting: Internal Medicine

## 2013-05-31 ENCOUNTER — Other Ambulatory Visit: Payer: Self-pay | Admitting: Internal Medicine

## 2013-05-31 ENCOUNTER — Encounter: Payer: Self-pay | Admitting: Internal Medicine

## 2013-05-31 MED ORDER — AZITHROMYCIN 250 MG PO TABS: ORAL_TABLET | ORAL | Status: DC

## 2013-05-31 MED ORDER — PROMETHAZINE-CODEINE 6.25-10 MG/5ML PO SYRP: ORAL_SOLUTION | ORAL | Status: DC

## 2013-07-27 ENCOUNTER — Other Ambulatory Visit: Payer: Self-pay | Admitting: Internal Medicine

## 2013-07-29 ENCOUNTER — Telehealth: Payer: Self-pay | Admitting: *Deleted

## 2013-07-29 NOTE — Telephone Encounter (Signed)
Yes. OK to get it Thx

## 2013-07-29 NOTE — Telephone Encounter (Signed)
Pt stated she forgot to come back and get the last dosage on her HepA&B. She received first injection in Oct, came back for 2nd one in Nov. She was due for 3rd one March she never came back. Wanting to know can she still get injection or does it still matters...Ellen [name]

## 2013-08-01 NOTE — Telephone Encounter (Signed)
Called pt no answer LMOM with md response.../lmb 

## 2013-08-04 ENCOUNTER — Ambulatory Visit (INDEPENDENT_AMBULATORY_CARE_PROVIDER_SITE_OTHER): Payer: BC Managed Care – PPO

## 2013-08-04 DIAGNOSIS — Z23 Encounter for immunization: Secondary | ICD-10-CM

## 2013-09-09 ENCOUNTER — Other Ambulatory Visit: Payer: Self-pay

## 2013-09-09 MED ORDER — OMEPRAZOLE 40 MG PO CPDR: 40.0000 mg | DELAYED_RELEASE_CAPSULE | Freq: Every day | ORAL | Status: DC

## 2013-09-09 MED ORDER — LEVOTHYROXINE SODIUM 50 MCG PO TABS: 25.0000 ug | ORAL_TABLET | Freq: Every day | ORAL | Status: DC

## 2013-09-09 MED ORDER — CITALOPRAM HYDROBROMIDE 10 MG PO TABS: ORAL_TABLET | ORAL | Status: DC

## 2013-09-14 ENCOUNTER — Telehealth: Payer: Self-pay | Admitting: *Deleted

## 2013-09-14 NOTE — Telephone Encounter (Signed)
Left msg on triage stating need to verify possible drug interaction on prilosec & citalopram use Ref # [id number]. Called [facility] spoke with Dian Situ per md ok for pt to continue taking...Johny Chess

## 2013-09-21 ENCOUNTER — Encounter: Payer: Self-pay | Admitting: Gynecology

## 2013-09-26 ENCOUNTER — Other Ambulatory Visit (HOSPITAL_COMMUNITY)
Admission: RE | Admit: 2013-09-26 | Discharge: 2013-09-26 | Disposition: A | Discharge status: Home / Self Care | Payer: Medicare Other | Source: Ambulatory Visit | Attending: Gynecology | Admitting: Gynecology

## 2013-09-26 ENCOUNTER — Ambulatory Visit (INDEPENDENT_AMBULATORY_CARE_PROVIDER_SITE_OTHER): Payer: Medicare Other | Admitting: Gynecology

## 2013-09-26 ENCOUNTER — Encounter: Payer: BC Managed Care – PPO | Admitting: Gynecology

## 2013-09-26 ENCOUNTER — Encounter: Payer: Self-pay | Admitting: Gynecology

## 2013-09-26 VITALS — BP 120/78 | Ht 62.0 in | Wt 157.0 lb

## 2013-09-26 DIAGNOSIS — N952 Postmenopausal atrophic vaginitis: Secondary | ICD-10-CM

## 2013-09-26 DIAGNOSIS — Z01419 Encounter for gynecological examination (general) (routine) without abnormal findings: Secondary | ICD-10-CM | POA: Diagnosis present

## 2013-09-26 DIAGNOSIS — Z1272 Encounter for screening for malignant neoplasm of vagina: Secondary | ICD-10-CM

## 2013-09-26 NOTE — Addendum Note (Signed)
Addended by: Nelva Nay on: 09/26/2013 11:49 AM   Modules accepted: Orders

## 2013-09-26 NOTE — Progress Notes (Signed)
MORAYO LEVEN [birth year] [id number]        63 y.o.  [id number] for follow up exam.  Past medical history,surgical history, problem list, medications, allergies, family history and social history were all reviewed and documented as reviewed in the EPIC chart.  ROS:  12 system ROS performed with pertinent positives and negatives included in the history, assessment and plan.   Additional significant findings :  none   Exam: Kim Counsellor Vitals:   09/26/13 0927  BP: 120/78  Height: 5\' 2"  (1.575 m)  Weight: 157 lb (71.215 kg)   General appearance:  Normal affect, orientation and appearance. Skin: Grossly normal HEENT: Without gross lesions.  No cervical or supraclavicular adenopathy. Thyroid normal.  Lungs:  Clear without wheezing, rales or rhonchi Cardiac: RR, without RMG Abdominal:  Soft, nontender, without masses, guarding, rebound, organomegaly or hernia Breasts:  Examined lying and sitting without masses, retractions, discharge or axillary adenopathy.  Well-healed bilateral reduction scars noted. Pelvic:  Ext/BUS/vagina with generalized atrophic changes. Of cuff done.  Adnexa  Without masses or tenderness    Anus and perineum  Normal   Rectovaginal  Normal sphincter tone without palpated masses or tenderness.    Assessment/Plan:  63 y.o. [id number] female for follow up exam.   1. Postmenopausal/atrophic genital changes. Patient without significant symptoms of hot flushes, night sweats, vaginal dryness or dyspareunia. Continue to monitor. Follow up if any issues develop. 2. Pap smear 2012. Pap smear of vaginal cuff done today. Status post vaginal hysterectomy for irregular bleeding. No history of significant abnormal Pap smears. Options to stop screening altogether or less frequent screening intervals reviewed. Will readdress on annual basis. 3. Mammography 09/2013. Continue with annual mammography. SBE monthly reviewed. 4. DEXA 2012 normal. Repeat at 5 year interval. Increase  calcium vitamin D reviewed. 5. Colonoscopy 2011. Repeat at their recommended interval. 6. Health maintenance. No routine blood work done as this is done through her primary physician's office. Follow up one year, sooner as needed.     Anastasio Auerbach MD, 10:01 AM 09/26/2013

## 2013-09-26 NOTE — Patient Instructions (Signed)
You may obtain a copy of any labs that were done today by logging onto MyChart as outlined in the instructions provided with your AVS (after visit summary). The office will not call with normal lab results but certainly if there are any significant abnormalities then we will contact you.   Health Maintenance, Female A healthy lifestyle and preventative care can promote health and wellness.  Maintain regular health, dental, and eye exams.  Eat a healthy diet. Foods like vegetables, fruits, whole grains, low-fat dairy products, and lean protein foods contain the nutrients you need without too many calories. Decrease your intake of foods high in solid fats, added sugars, and salt. Get information about a proper diet from your caregiver, if necessary.  Regular physical exercise is one of the most important things you can do for your health. Most adults should get at least 150 minutes of moderate-intensity exercise (any activity that increases your heart rate and causes you to sweat) each week. In addition, most adults need muscle-strengthening exercises on 2 or more days a week.   Maintain a healthy weight. The body mass index (BMI) is a screening tool to identify possible weight problems. It provides an estimate of body fat based on height and weight. Your caregiver can help determine your BMI, and can help you achieve or maintain a healthy weight. For adults 20 years and older:  A BMI below 18.5 is considered underweight.  A BMI of 18.5 to 24.9 is normal.  A BMI of 25 to 29.9 is considered overweight.  A BMI of 30 and above is considered obese.  Maintain normal blood lipids and cholesterol by exercising and minimizing your intake of saturated fat. Eat a balanced diet with plenty of fruits and vegetables. Blood tests for lipids and cholesterol should begin at age 61 and be repeated every 5 years. If your lipid or cholesterol levels are high, you are over 50, or you are a high risk for heart  disease, you may need your cholesterol levels checked more frequently.Ongoing high lipid and cholesterol levels should be treated with medicines if diet and exercise are not effective.  If you smoke, find out from your caregiver how to quit. If you do not use tobacco, do not start.  Lung cancer screening is recommended for adults aged 33 80 years who are at high risk for developing lung cancer because of a history of smoking. Yearly low-dose computed tomography (CT) is recommended for people who have at least a 30-pack-year history of smoking and are a current smoker or have quit within the past 15 years. A pack year of smoking is smoking an average of 1 pack of cigarettes a day for 1 year (for example: 1 pack a day for 30 years or 2 packs a day for 15 years). Yearly screening should continue until the smoker has stopped smoking for at least 15 years. Yearly screening should also be stopped for people who develop a health problem that would prevent them from having lung cancer treatment.  If you are pregnant, do not drink alcohol. If you are breastfeeding, be very cautious about drinking alcohol. If you are not pregnant and choose to drink alcohol, do not exceed 1 drink per day. One drink is considered to be 12 ounces (355 mL) of beer, 5 ounces (148 mL) of wine, or 1.5 ounces (44 mL) of liquor.  Avoid use of street drugs. Do not share needles with anyone. Ask for help if you need support or instructions about stopping  the use of drugs.  High blood pressure causes heart disease and increases the risk of stroke. Blood pressure should be checked at least every 1 to 2 years. Ongoing high blood pressure should be treated with medicines, if weight loss and exercise are not effective.  If you are 59 to 64 years old, ask your caregiver if you should take aspirin to prevent strokes.  Diabetes screening involves taking a blood sample to check your fasting blood sugar level. This should be done once every 3  years, after age 91, if you are within normal weight and without risk factors for diabetes. Testing should be considered at a younger age or be carried out more frequently if you are overweight and have at least 1 risk factor for diabetes.  Breast cancer screening is essential preventative care for women. You should practice "breast self-awareness." This means understanding the normal appearance and feel of your breasts and may include breast self-examination. Any changes detected, no matter how small, should be reported to a caregiver. Women in their 66s and 30s should have a clinical breast exam (CBE) by a caregiver as part of a regular health exam every 1 to 3 years. After age 101, women should have a CBE every year. Starting at age 100, women should consider having a mammogram (breast X-ray) every year. Women who have a family history of breast cancer should talk to their caregiver about genetic screening. Women at a high risk of breast cancer should talk to their caregiver about having an MRI and a mammogram every year.  Breast cancer gene (BRCA)-related cancer risk assessment is recommended for women who have family members with BRCA-related cancers. BRCA-related cancers include breast, ovarian, tubal, and peritoneal cancers. Having family members with these cancers may be associated with an increased risk for harmful changes (mutations) in the breast cancer genes BRCA1 and BRCA2. Results of the assessment will determine the need for genetic counseling and BRCA1 and BRCA2 testing.  The Pap test is a screening test for cervical cancer. Women should have a Pap test starting at age 57. Between ages 25 and 35, Pap tests should be repeated every 2 years. Beginning at age 37, you should have a Pap test every 3 years as long as the past 3 Pap tests have been normal. If you had a hysterectomy for a problem that was not cancer or a condition that could lead to cancer, then you no longer need Pap tests. If you are  between ages 50 and 76, and you have had normal Pap tests going back 10 years, you no longer need Pap tests. If you have had past treatment for cervical cancer or a condition that could lead to cancer, you need Pap tests and screening for cancer for at least 20 years after your treatment. If Pap tests have been discontinued, risk factors (such as a new sexual partner) need to be reassessed to determine if screening should be resumed. Some women have medical problems that increase the chance of getting cervical cancer. In these cases, your caregiver may recommend more frequent screening and Pap tests.  The human papillomavirus (HPV) test is an additional test that may be used for cervical cancer screening. The HPV test looks for the virus that can cause the cell changes on the cervix. The cells collected during the Pap test can be tested for HPV. The HPV test could be used to screen women aged 44 years and older, and should be used in women of any age  who have unclear Pap test results. After the age of 55, women should have HPV testing at the same frequency as a Pap test.  Colorectal cancer can be detected and often prevented. Most routine colorectal cancer screening begins at the age of 44 and continues through age 20. However, your caregiver may recommend screening at an earlier age if you have risk factors for colon cancer. On a yearly basis, your caregiver may provide home test kits to check for hidden blood in the stool. Use of a small camera at the end of a tube, to directly examine the colon (sigmoidoscopy or colonoscopy), can detect the earliest forms of colorectal cancer. Talk to your caregiver about this at age 86, when routine screening begins. Direct examination of the colon should be repeated every 5 to 10 years through age 13, unless early forms of pre-cancerous polyps or small growths are found.  Hepatitis C blood testing is recommended for all people born from [birth year] through 1967 and any  individual with known risks for hepatitis C.  Practice safe sex. Use condoms and avoid high-risk sexual practices to reduce the spread of sexually transmitted infections (STIs). Sexually active women aged 36 and younger should be checked for Chlamydia, which is a common sexually transmitted infection. Older women with new or multiple partners should also be tested for Chlamydia. Testing for other STIs is recommended if you are sexually active and at increased risk.  Osteoporosis is a disease in which the bones lose minerals and strength with aging. This can result in serious bone fractures. The risk of osteoporosis can be identified using a bone density scan. Women ages 20 and over and women at risk for fractures or osteoporosis should discuss screening with their caregivers. Ask your caregiver whether you should be taking a calcium supplement or vitamin D to reduce the rate of osteoporosis.  Menopause can be associated with physical symptoms and risks. Hormone replacement therapy is available to decrease symptoms and risks. You should talk to your caregiver about whether hormone replacement therapy is right for you.  Use sunscreen. Apply sunscreen liberally and repeatedly throughout the day. You should seek shade when your shadow is shorter than you. Protect yourself by wearing long sleeves, pants, a wide-brimmed hat, and sunglasses year round, whenever you are outdoors.  Notify your caregiver of new moles or changes in moles, especially if there is a change in shape or color. Also notify your caregiver if a mole is larger than the size of a pencil eraser.  Stay current with your immunizations. Document Released: 07/08/2010 Document Revised: 04/19/2012 Document Reviewed: 07/08/2010 [facility] Patient Information 2014 [employer].

## 2013-09-27 LAB — URINALYSIS W MICROSCOPIC + REFLEX CULTURE
Bacteria, UA: NONE SEEN
Bilirubin Urine: NEGATIVE
Casts: NONE SEEN
Crystals: NONE SEEN
Glucose, UA: NEGATIVE mg/dL
Hgb urine dipstick: NEGATIVE
Ketones, ur: NEGATIVE mg/dL
Leukocytes, UA: NEGATIVE
Nitrite: NEGATIVE
Protein, ur: NEGATIVE mg/dL
Specific Gravity, Urine: <1.005 — ABNORMAL LOW (ref 1.005–1.030)
Squamous Epithelial / LPF: NONE SEEN
Urobilinogen, UA: 0.2 mg/dL (ref 0.0–1.0)
pH: 7 (ref 5.0–8.0)

## 2013-09-27 LAB — CYTOLOGY - PAP

## 2013-09-30 ENCOUNTER — Ambulatory Visit (INDEPENDENT_AMBULATORY_CARE_PROVIDER_SITE_OTHER): Payer: BC Managed Care – PPO | Admitting: *Deleted

## 2013-09-30 DIAGNOSIS — Z23 Encounter for immunization: Secondary | ICD-10-CM

## 2013-10-04 ENCOUNTER — Ambulatory Visit: Payer: BC Managed Care – PPO

## 2013-11-07 ENCOUNTER — Encounter: Payer: Self-pay | Admitting: Gynecology

## 2013-12-09 ENCOUNTER — Ambulatory Visit (INDEPENDENT_AMBULATORY_CARE_PROVIDER_SITE_OTHER): Payer: Medicare Other | Admitting: Internal Medicine

## 2013-12-09 ENCOUNTER — Encounter: Payer: Self-pay | Admitting: Internal Medicine

## 2013-12-09 ENCOUNTER — Other Ambulatory Visit: Payer: BC Managed Care – PPO

## 2013-12-09 VITALS — BP 112/80 | HR 70 | Temp 97.6°F | Ht 62.0 in | Wt 156.0 lb

## 2013-12-09 DIAGNOSIS — Z0000 Encounter for general adult medical examination without abnormal findings: Secondary | ICD-10-CM

## 2013-12-09 NOTE — Progress Notes (Signed)
Pre visit review using our clinic review tool, if applicable. No additional management support is needed unless otherwise documented below in the visit note.  Vitals entered for Jonathon Resides due to Epic log in issues

## 2013-12-11 ENCOUNTER — Encounter: Payer: Self-pay | Admitting: Internal Medicine

## 2013-12-11 NOTE — Patient Instructions (Signed)
Preventive Care for Adults A healthy lifestyle and preventive care can promote health and wellness. Preventive health guidelines for women include the following key practices.  A routine yearly physical is a good way to check with your health care provider about your health and preventive screening. It is a chance to share any concerns and updates on your health and to receive a thorough exam.  Visit your dentist for a routine exam and preventive care every 6 months. Brush your teeth twice a day and floss once a day. Good oral hygiene prevents tooth decay and gum disease.  The frequency of eye exams is based on your age, health, family medical history, use of contact lenses, and other factors. Follow your health care provider's recommendations for frequency of eye exams.  Eat a healthy diet. Foods like vegetables, fruits, whole grains, low-fat dairy products, and lean protein foods contain the nutrients you need without too many calories. Decrease your intake of foods high in solid fats, added sugars, and salt. Eat the right amount of calories for you.Get information about a proper diet from your health care provider, if necessary.  Regular physical exercise is one of the most important things you can do for your health. Most adults should get at least 150 minutes of moderate-intensity exercise (any activity that increases your heart rate and causes you to sweat) each week. In addition, most adults need muscle-strengthening exercises on 2 or more days a week.  Maintain a healthy weight. The body mass index (BMI) is a screening tool to identify possible weight problems. It provides an estimate of body fat based on height and weight. Your health care provider can find your BMI and can help you achieve or maintain a healthy weight.For adults 20 years and older:  A BMI below 18.5 is considered underweight.  A BMI of 18.5 to 24.9 is normal.  A BMI of 25 to 29.9 is considered overweight.  A BMI of  30 and above is considered obese.  Maintain normal blood lipids and cholesterol levels by exercising and minimizing your intake of saturated fat. Eat a balanced diet with plenty of fruit and vegetables. Blood tests for lipids and cholesterol should begin at age 76 and be repeated every 5 years. If your lipid or cholesterol levels are high, you are over 50, or you are at high risk for heart disease, you may need your cholesterol levels checked more frequently.Ongoing high lipid and cholesterol levels should be treated with medicines if diet and exercise are not working.  If you smoke, find out from your health care provider how to quit. If you do not use tobacco, do not start.  Lung cancer screening is recommended for adults aged 22-80 years who are at high risk for developing lung cancer because of a history of smoking. A yearly low-dose CT scan of the lungs is recommended for people who have at least a 30-pack-year history of smoking and are a current smoker or have quit within the past 15 years. A pack year of smoking is smoking an average of 1 pack of cigarettes a day for 1 year (for example: 1 pack a day for 30 years or 2 packs a day for 15 years). Yearly screening should continue until the smoker has stopped smoking for at least 15 years. Yearly screening should be stopped for people who develop a health problem that would prevent them from having lung cancer treatment.  If you are pregnant, do not drink alcohol. If you are breastfeeding,  be very cautious about drinking alcohol. If you are not pregnant and choose to drink alcohol, do not have more than 1 drink per day. One drink is considered to be 12 ounces (355 mL) of beer, 5 ounces (148 mL) of wine, or 1.5 ounces (44 mL) of liquor.  Avoid use of street drugs. Do not share needles with anyone. Ask for help if you need support or instructions about stopping the use of drugs.  High blood pressure causes heart disease and increases the risk of  stroke. Your blood pressure should be checked at least every 1 to 2 years. Ongoing high blood pressure should be treated with medicines if weight loss and exercise do not work.  If you are 3-86 years old, ask your health care provider if you should take aspirin to prevent strokes.  Diabetes screening involves taking a blood sample to check your fasting blood sugar level. This should be done once every 3 years, after age 67, if you are within normal weight and without risk factors for diabetes. Testing should be considered at a younger age or be carried out more frequently if you are overweight and have at least 1 risk factor for diabetes.  Breast cancer screening is essential preventive care for women. You should practice "breast self-awareness." This means understanding the normal appearance and feel of your breasts and may include breast self-examination. Any changes detected, no matter how small, should be reported to a health care provider. Women in their 8s and 30s should have a clinical breast exam (CBE) by a health care provider as part of a regular health exam every 1 to 3 years. After age 70, women should have a CBE every year. Starting at age 25, women should consider having a mammogram (breast X-ray test) every year. Women who have a family history of breast cancer should talk to their health care provider about genetic screening. Women at a high risk of breast cancer should talk to their health care providers about having an MRI and a mammogram every year.  Breast cancer gene (BRCA)-related cancer risk assessment is recommended for women who have family members with BRCA-related cancers. BRCA-related cancers include breast, ovarian, tubal, and peritoneal cancers. Having family members with these cancers may be associated with an increased risk for harmful changes (mutations) in the breast cancer genes BRCA1 and BRCA2. Results of the assessment will determine the need for genetic counseling and  BRCA1 and BRCA2 testing.  Routine pelvic exams to screen for cancer are no longer recommended for nonpregnant women who are considered low risk for cancer of the pelvic organs (ovaries, uterus, and vagina) and who do not have symptoms. Ask your health care provider if a screening pelvic exam is right for you.  If you have had past treatment for cervical cancer or a condition that could lead to cancer, you need Pap tests and screening for cancer for at least 20 years after your treatment. If Pap tests have been discontinued, your risk factors (such as having a new sexual partner) need to be reassessed to determine if screening should be resumed. Some women have medical problems that increase the chance of getting cervical cancer. In these cases, your health care provider may recommend more frequent screening and Pap tests.  The HPV test is an additional test that may be used for cervical cancer screening. The HPV test looks for the virus that can cause the cell changes on the cervix. The cells collected during the Pap test can be  tested for HPV. The HPV test could be used to screen women aged 30 years and older, and should be used in women of any age who have unclear Pap test results. After the age of 30, women should have HPV testing at the same frequency as a Pap test.  Colorectal cancer can be detected and often prevented. Most routine colorectal cancer screening begins at the age of 50 years and continues through age 75 years. However, your health care provider may recommend screening at an earlier age if you have risk factors for colon cancer. On a yearly basis, your health care provider may provide home test kits to check for hidden blood in the stool. Use of a small camera at the end of a tube, to directly examine the colon (sigmoidoscopy or colonoscopy), can detect the earliest forms of colorectal cancer. Talk to your health care provider about this at age 50, when routine screening begins. Direct  exam of the colon should be repeated every 5-10 years through age 75 years, unless early forms of pre-cancerous polyps or small growths are found.  People who are at an increased risk for hepatitis B should be screened for this virus. You are considered at high risk for hepatitis B if:  You were born in a country where hepatitis B occurs often. Talk with your health care provider about which countries are considered high risk.  Your parents were born in a high-risk country and you have not received a shot to protect against hepatitis B (hepatitis B vaccine).  You have HIV or AIDS.  You use needles to inject street drugs.  You live with, or have sex with, someone who has hepatitis B.  You get hemodialysis treatment.  You take certain medicines for conditions like cancer, organ transplantation, and autoimmune conditions.  Hepatitis C blood testing is recommended for all people born from 1945 through 1965 and any individual with known risks for hepatitis C.  Practice safe sex. Use condoms and avoid high-risk sexual practices to reduce the spread of sexually transmitted infections (STIs). STIs include gonorrhea, chlamydia, syphilis, trichomonas, herpes, HPV, and human immunodeficiency virus (HIV). Herpes, HIV, and HPV are viral illnesses that have no cure. They can result in disability, cancer, and death.  You should be screened for sexually transmitted illnesses (STIs) including gonorrhea and chlamydia if:  You are sexually active and are younger than 24 years.  You are older than 24 years and your health care provider tells you that you are at risk for this type of infection.  Your sexual activity has changed since you were last screened and you are at an increased risk for chlamydia or gonorrhea. Ask your health care provider if you are at risk.  If you are at risk of being infected with HIV, it is recommended that you take a prescription medicine daily to prevent HIV infection. This is  called preexposure prophylaxis (PrEP). You are considered at risk if:  You are a heterosexual woman, are sexually active, and are at increased risk for HIV infection.  You take drugs by injection.  You are sexually active with a partner who has HIV.  Talk with your health care provider about whether you are at high risk of being infected with HIV. If you choose to begin PrEP, you should first be tested for HIV. You should then be tested every 3 months for as long as you are taking PrEP.  Osteoporosis is a disease in which the bones lose minerals and strength   with aging. This can result in serious bone fractures or breaks. The risk of osteoporosis can be identified using a bone density scan. Women ages 65 years and over and women at risk for fractures or osteoporosis should discuss screening with their health care providers. Ask your health care provider whether you should take a calcium supplement or vitamin D to reduce the rate of osteoporosis.  Menopause can be associated with physical symptoms and risks. Hormone replacement therapy is available to decrease symptoms and risks. You should talk to your health care provider about whether hormone replacement therapy is right for you.  Use sunscreen. Apply sunscreen liberally and repeatedly throughout the day. You should seek shade when your shadow is shorter than you. Protect yourself by wearing long sleeves, pants, a wide-brimmed hat, and sunglasses year round, whenever you are outdoors.  Once a month, do a whole body skin exam, using a mirror to look at the skin on your back. Tell your health care provider of new moles, moles that have irregular borders, moles that are larger than a pencil eraser, or moles that have changed in shape or color.  Stay current with required vaccines (immunizations).  Influenza vaccine. All adults should be immunized every year.  Tetanus, diphtheria, and acellular pertussis (Td, Tdap) vaccine. Pregnant women should  receive 1 dose of Tdap vaccine during each pregnancy. The dose should be obtained regardless of the length of time since the last dose. Immunization is preferred during the 27th-36th week of gestation. An adult who has not previously received Tdap or who does not know her vaccine status should receive 1 dose of Tdap. This initial dose should be followed by tetanus and diphtheria toxoids (Td) booster doses every 10 years. Adults with an unknown or incomplete history of completing a 3-dose immunization series with Td-containing vaccines should begin or complete a primary immunization series including a Tdap dose. Adults should receive a Td booster every 10 years.  Varicella vaccine. An adult without evidence of immunity to varicella should receive 2 doses or a second dose if she has previously received 1 dose. Pregnant females who do not have evidence of immunity should receive the first dose after pregnancy. This first dose should be obtained before leaving the health care facility. The second dose should be obtained 4-8 weeks after the first dose.  Human papillomavirus (HPV) vaccine. Females aged 13-26 years who have not received the vaccine previously should obtain the 3-dose series. The vaccine is not recommended for use in pregnant females. However, pregnancy testing is not needed before receiving a dose. If a female is found to be pregnant after receiving a dose, no treatment is needed. In that case, the remaining doses should be delayed until after the pregnancy. Immunization is recommended for any person with an immunocompromised condition through the age of 26 years if she did not get any or all doses earlier. During the 3-dose series, the second dose should be obtained 4-8 weeks after the first dose. The third dose should be obtained 24 weeks after the first dose and 16 weeks after the second dose.  Zoster vaccine. One dose is recommended for adults aged 60 years or older unless certain conditions are  present.  Measles, mumps, and rubella (MMR) vaccine. Adults born before 1957 generally are considered immune to measles and mumps. Adults born in 1957 or later should have 1 or more doses of MMR vaccine unless there is a contraindication to the vaccine or there is laboratory evidence of immunity to   each of the three diseases. A routine second dose of MMR vaccine should be obtained at least 28 days after the first dose for students attending postsecondary schools, health care workers, or international travelers. People who received inactivated measles vaccine or an unknown type of measles vaccine during 1963-1967 should receive 2 doses of MMR vaccine. People who received inactivated mumps vaccine or an unknown type of mumps vaccine before 1979 and are at high risk for mumps infection should consider immunization with 2 doses of MMR vaccine. For females of childbearing age, rubella immunity should be determined. If there is no evidence of immunity, females who are not pregnant should be vaccinated. If there is no evidence of immunity, females who are pregnant should delay immunization until after pregnancy. Unvaccinated health care workers born before 1957 who lack laboratory evidence of measles, mumps, or rubella immunity or laboratory confirmation of disease should consider measles and mumps immunization with 2 doses of MMR vaccine or rubella immunization with 1 dose of MMR vaccine.  Pneumococcal 13-valent conjugate (PCV13) vaccine. When indicated, a person who is uncertain of her immunization history and has no record of immunization should receive the PCV13 vaccine. An adult aged 19 years or older who has certain medical conditions and has not been previously immunized should receive 1 dose of PCV13 vaccine. This PCV13 should be followed with a dose of pneumococcal polysaccharide (PPSV23) vaccine. The PPSV23 vaccine dose should be obtained at least 8 weeks after the dose of PCV13 vaccine. An adult aged 19  years or older who has certain medical conditions and previously received 1 or more doses of PPSV23 vaccine should receive 1 dose of PCV13. The PCV13 vaccine dose should be obtained 1 or more years after the last PPSV23 vaccine dose.  Pneumococcal polysaccharide (PPSV23) vaccine. When PCV13 is also indicated, PCV13 should be obtained first. All adults aged 65 years and older should be immunized. An adult younger than age 65 years who has certain medical conditions should be immunized. Any person who resides in a nursing home or long-term care facility should be immunized. An adult smoker should be immunized. People with an immunocompromised condition and certain other conditions should receive both PCV13 and PPSV23 vaccines. People with human immunodeficiency virus (HIV) infection should be immunized as soon as possible after diagnosis. Immunization during chemotherapy or radiation therapy should be avoided. Routine use of PPSV23 vaccine is not recommended for American Indians, [place] Natives, or people younger than 65 years unless there are medical conditions that require PPSV23 vaccine. When indicated, people who have unknown immunization and have no record of immunization should receive PPSV23 vaccine. One-time revaccination 5 years after the first dose of PPSV23 is recommended for people aged 19-64 years who have chronic kidney failure, nephrotic syndrome, asplenia, or immunocompromised conditions. People who received 1-2 doses of PPSV23 before age 65 years should receive another dose of PPSV23 vaccine at age 65 years or later if at least 5 years have passed since the previous dose. Doses of PPSV23 are not needed for people immunized with PPSV23 at or after age 65 years.  Meningococcal vaccine. Adults with asplenia or persistent complement component deficiencies should receive 2 doses of quadrivalent meningococcal conjugate (MenACWY-D) vaccine. The doses should be obtained at least 2 months apart.  Microbiologists working with certain meningococcal bacteria, military recruits, people at risk during an outbreak, and people who travel to or live in countries with a high rate of meningitis should be immunized. A first-year college student up through age   21 years who is living in a residence hall should receive a dose if she did not receive a dose on or after her 16th birthday. Adults who have certain high-risk conditions should receive one or more doses of vaccine.  Hepatitis A vaccine. Adults who wish to be protected from this disease, have certain high-risk conditions, work with hepatitis A-infected animals, work in hepatitis A research labs, or travel to or work in countries with a high rate of hepatitis A should be immunized. Adults who were previously unvaccinated and who anticipate close contact with an international adoptee during the first 60 days after arrival in the [place] from a country with a high rate of hepatitis A should be immunized.  Hepatitis B vaccine. Adults who wish to be protected from this disease, have certain high-risk conditions, may be exposed to blood or other infectious body fluids, are household contacts or sex partners of hepatitis B positive people, are clients or workers in certain care facilities, or travel to or work in countries with a high rate of hepatitis B should be immunized.  Haemophilus influenzae type b (Hib) vaccine. A previously unvaccinated person with asplenia or sickle cell disease or having a scheduled splenectomy should receive 1 dose of Hib vaccine. Regardless of previous immunization, a recipient of a hematopoietic stem cell transplant should receive a 3-dose series 6-12 months after her successful transplant. Hib vaccine is not recommended for adults with HIV infection. Preventive Services / Frequency Ages 64 to 68 years  Blood pressure check.** / Every 1 to 2 years.  Lipid and cholesterol check.** / Every 5 years beginning at age  22.  Clinical breast exam.** / Every 3 years for women in their 88s and 53s.  BRCA-related cancer risk assessment.** / For women who have family members with a BRCA-related cancer (breast, ovarian, tubal, or peritoneal cancers).  Pap test.** / Every 2 years from ages 90 through 51. Every 3 years starting at age 21 through age 56 or 3 with a history of 3 consecutive normal Pap tests.  HPV screening.** / Every 3 years from ages 24 through ages 1 to 46 with a history of 3 consecutive normal Pap tests.  Hepatitis C blood test.** / For any individual with known risks for hepatitis C.  Skin self-exam. / Monthly.  Influenza vaccine. / Every year.  Tetanus, diphtheria, and acellular pertussis (Tdap, Td) vaccine.** / Consult your health care provider. Pregnant women should receive 1 dose of Tdap vaccine during each pregnancy. 1 dose of Td every 10 years.  Varicella vaccine.** / Consult your health care provider. Pregnant females who do not have evidence of immunity should receive the first dose after pregnancy.  HPV vaccine. / 3 doses over 6 months, if 72 and younger. The vaccine is not recommended for use in pregnant females. However, pregnancy testing is not needed before receiving a dose.  Measles, mumps, rubella (MMR) vaccine.** / You need at least 1 dose of MMR if you were born in 1957 or later. You may also need a 2nd dose. For females of childbearing age, rubella immunity should be determined. If there is no evidence of immunity, females who are not pregnant should be vaccinated. If there is no evidence of immunity, females who are pregnant should delay immunization until after pregnancy.  Pneumococcal 13-valent conjugate (PCV13) vaccine.** / Consult your health care provider.  Pneumococcal polysaccharide (PPSV23) vaccine.** / 1 to 2 doses if you smoke cigarettes or if you have certain conditions.  Meningococcal vaccine.** /  1 dose if you are age 19 to 21 years and a first-year college  student living in a residence hall, or have one of several medical conditions, you need to get vaccinated against meningococcal disease. You may also need additional booster doses.  Hepatitis A vaccine.** / Consult your health care provider.  Hepatitis B vaccine.** / Consult your health care provider.  Haemophilus influenzae type b (Hib) vaccine.** / Consult your health care provider. Ages 40 to 64 years  Blood pressure check.** / Every 1 to 2 years.  Lipid and cholesterol check.** / Every 5 years beginning at age 20 years.  Lung cancer screening. / Every year if you are aged 55-80 years and have a 30-pack-year history of smoking and currently smoke or have quit within the past 15 years. Yearly screening is stopped once you have quit smoking for at least 15 years or develop a health problem that would prevent you from having lung cancer treatment.  Clinical breast exam.** / Every year after age 40 years.  BRCA-related cancer risk assessment.** / For women who have family members with a BRCA-related cancer (breast, ovarian, tubal, or peritoneal cancers).  Mammogram.** / Every year beginning at age 40 years and continuing for as long as you are in good health. Consult with your health care provider.  Pap test.** / Every 3 years starting at age 30 years through age 65 or 70 years with a history of 3 consecutive normal Pap tests.  HPV screening.** / Every 3 years from ages 30 years through ages 65 to 70 years with a history of 3 consecutive normal Pap tests.  Fecal occult blood test (FOBT) of stool. / Every year beginning at age 50 years and continuing until age 75 years. You may not need to do this test if you get a colonoscopy every 10 years.  Flexible sigmoidoscopy or colonoscopy.** / Every 5 years for a flexible sigmoidoscopy or every 10 years for a colonoscopy beginning at age 50 years and continuing until age 75 years.  Hepatitis C blood test.** / For all people born from 1945 through  1965 and any individual with known risks for hepatitis C.  Skin self-exam. / Monthly.  Influenza vaccine. / Every year.  Tetanus, diphtheria, and acellular pertussis (Tdap/Td) vaccine.** / Consult your health care provider. Pregnant women should receive 1 dose of Tdap vaccine during each pregnancy. 1 dose of Td every 10 years.  Varicella vaccine.** / Consult your health care provider. Pregnant females who do not have evidence of immunity should receive the first dose after pregnancy.  Zoster vaccine.** / 1 dose for adults aged 60 years or older.  Measles, mumps, rubella (MMR) vaccine.** / You need at least 1 dose of MMR if you were born in 1957 or later. You may also need a 2nd dose. For females of childbearing age, rubella immunity should be determined. If there is no evidence of immunity, females who are not pregnant should be vaccinated. If there is no evidence of immunity, females who are pregnant should delay immunization until after pregnancy.  Pneumococcal 13-valent conjugate (PCV13) vaccine.** / Consult your health care provider.  Pneumococcal polysaccharide (PPSV23) vaccine.** / 1 to 2 doses if you smoke cigarettes or if you have certain conditions.  Meningococcal vaccine.** / Consult your health care provider.  Hepatitis A vaccine.** / Consult your health care provider.  Hepatitis B vaccine.** / Consult your health care provider.  Haemophilus influenzae type b (Hib) vaccine.** / Consult your health care provider. Ages 65   years and over  Blood pressure check.** / Every 1 to 2 years.  Lipid and cholesterol check.** / Every 5 years beginning at age 22 years.  Lung cancer screening. / Every year if you are aged 73-80 years and have a 30-pack-year history of smoking and currently smoke or have quit within the past 15 years. Yearly screening is stopped once you have quit smoking for at least 15 years or develop a health problem that would prevent you from having lung cancer  treatment.  Clinical breast exam.** / Every year after age 4 years.  BRCA-related cancer risk assessment.** / For women who have family members with a BRCA-related cancer (breast, ovarian, tubal, or peritoneal cancers).  Mammogram.** / Every year beginning at age 40 years and continuing for as long as you are in good health. Consult with your health care provider.  Pap test.** / Every 3 years starting at age 9 years through age 34 or 91 years with 3 consecutive normal Pap tests. Testing can be stopped between 65 and 70 years with 3 consecutive normal Pap tests and no abnormal Pap or HPV tests in the past 10 years.  HPV screening.** / Every 3 years from ages 57 years through ages 64 or 45 years with a history of 3 consecutive normal Pap tests. Testing can be stopped between 65 and 70 years with 3 consecutive normal Pap tests and no abnormal Pap or HPV tests in the past 10 years.  Fecal occult blood test (FOBT) of stool. / Every year beginning at age 15 years and continuing until age 17 years. You may not need to do this test if you get a colonoscopy every 10 years.  Flexible sigmoidoscopy or colonoscopy.** / Every 5 years for a flexible sigmoidoscopy or every 10 years for a colonoscopy beginning at age 86 years and continuing until age 71 years.  Hepatitis C blood test.** / For all people born from [birth year] through 1967 and any individual with known risks for hepatitis C.  Osteoporosis screening.** / A one-time screening for women ages 83 years and over and women at risk for fractures or osteoporosis.  Skin self-exam. / Monthly.  Influenza vaccine. / Every year.  Tetanus, diphtheria, and acellular pertussis (Tdap/Td) vaccine.** / 1 dose of Td every 10 years.  Varicella vaccine.** / Consult your health care provider.  Zoster vaccine.** / 1 dose for adults aged 61 years or older.  Pneumococcal 13-valent conjugate (PCV13) vaccine.** / Consult your health care provider.  Pneumococcal  polysaccharide (PPSV23) vaccine.** / 1 dose for all adults aged 28 years and older.  Meningococcal vaccine.** / Consult your health care provider.  Hepatitis A vaccine.** / Consult your health care provider.  Hepatitis B vaccine.** / Consult your health care provider.  Haemophilus influenzae type b (Hib) vaccine.** / Consult your health care provider. ** Family history and personal history of risk and conditions may change your health care provider's recommendations. Document Released: 02/18/2001 Document Revised: 05/09/2013 Document Reviewed: 05/20/2010 [facility] Patient Information 2015 [place], [place]. This information is not intended to replace advice given to you by your health care provider. Make sure you discuss any questions you have with your health care provider.

## 2013-12-11 NOTE — Assessment & Plan Note (Signed)
Here for medicare wellness/physical  Diet: heart healthy  Physical activity: not sedentary, very active  Depression/mood screen: negative  Hearing: intact to whispered voice  Visual acuity: grossly normal, performs annual eye exam  ADLs: capable  Fall risk: none  Home safety: good  Cognitive evaluation: intact to orientation, naming, recall and repetition  EOL planning: adv directives, full code/ I agree  I have personally reviewed and have noted  1. The patient's medical and social history  2. Their use of alcohol, tobacco or illicit drugs  3. Their current medications and supplements  4. The patient's functional ability including ADL's, fall risks, home safety risks and hearing or visual impairment.  5. Diet and physical activities  6. Evidence for depression or mood disorders    Today patient counseled on age appropriate routine health concerns for screening and prevention, each reviewed and up to date or declined. Immunizations reviewed and up to date or declined. Labs ordered and reviewed. Risk factors for depression reviewed and negative. Hearing function and visual acuity are intact. ADLs screened and addressed as needed. Functional ability and level of safety reviewed and appropriate. Education, counseling and referrals performed based on assessed risks today. Patient provided with a copy of personalized plan for preventive services.

## 2013-12-11 NOTE — Progress Notes (Signed)
   Subjective:    HPI   The patient is here for a wellness exam. The patient has been doing well overall without major physical or psychological issues going on lately.   BP Readings from Last 3 Encounters:  12/09/13 112/80  09/26/13 120/78  05/23/13 158/108   Wt Readings from Last 3 Encounters:  12/09/13 156 lb (70.761 kg)  09/26/13 157 lb (71.215 kg)  12/08/12 164 lb (74.39 kg)       Review of Systems  Constitutional: Negative.  Negative for fever, chills, diaphoresis, activity change, appetite change, fatigue and unexpected weight change.  HENT: Negative for congestion, ear pain, facial swelling, hearing loss, mouth sores, nosebleeds, postnasal drip, rhinorrhea, sinus pressure, sneezing, sore throat, tinnitus and trouble swallowing.   Eyes: Negative for pain, discharge, redness, itching and visual disturbance.  Respiratory: Negative for cough, chest tightness, shortness of breath, wheezing and stridor.   Cardiovascular: Negative for chest pain, palpitations and leg swelling.  Gastrointestinal: Negative for nausea, abdominal pain, diarrhea, constipation, blood in stool, abdominal distention, anal bleeding and rectal pain.  Genitourinary: Negative for dysuria, urgency, frequency, hematuria, flank pain (right), vaginal bleeding, vaginal discharge, difficulty urinating, genital sores, vaginal pain and pelvic pain.  Musculoskeletal: Negative for back pain, joint swelling, arthralgias, gait problem, neck pain and neck stiffness.  Skin: Negative.  Negative for pallor and rash.  Neurological: Negative for dizziness, tremors, seizures, syncope, speech difficulty, weakness, numbness and headaches.  Hematological: Negative for adenopathy. Does not bruise/bleed easily.  Psychiatric/Behavioral: Negative for suicidal ideas, behavioral problems, confusion, sleep disturbance, dysphoric mood and decreased concentration. The patient is not nervous/anxious.        Objective:   Physical  Exam Lab Results  Component Value Date   WBC 6.2 12/08/2012   HGB 13.4 12/08/2012   HCT 39.6 12/08/2012   PLT 192.0 12/08/2012   GLUCOSE 92 12/08/2012   CHOL 249* 07/09/2011   TRIG 112.0 07/09/2011   HDL 76.20 07/09/2011   LDLDIRECT 142.6 07/09/2011   LDLCALC 105* 10/12/2006   ALT 23 12/08/2012   AST 27 12/08/2012   NA 140 12/08/2012   K 4.2 12/08/2012   CL 105 12/08/2012   CREATININE 0.7 12/08/2012   BUN 12 12/08/2012   CO2 28 12/08/2012   TSH 3.11 12/08/2012   HGBA1C 5.6 10/12/2006           Assessment & Plan:

## 2013-12-12 ENCOUNTER — Other Ambulatory Visit (INDEPENDENT_AMBULATORY_CARE_PROVIDER_SITE_OTHER): Payer: Medicare Other

## 2013-12-12 DIAGNOSIS — Z0000 Encounter for general adult medical examination without abnormal findings: Secondary | ICD-10-CM

## 2013-12-12 LAB — LIPID PANEL
Cholesterol: 233 mg/dL — ABNORMAL HIGH (ref 0–200)
HDL: 71.5 mg/dL (ref >39.00)
LDL Cholesterol: 138 mg/dL — ABNORMAL HIGH (ref 0–99)
NonHDL: 161.5
Total CHOL/HDL Ratio: 3
Triglycerides: 120 mg/dL (ref 0.0–149.0)
VLDL: 24 mg/dL (ref 0.0–40.0)

## 2013-12-12 LAB — CBC WITH DIFFERENTIAL/PLATELET
Basophils Absolute: 0 10*3/uL (ref 0.0–0.1)
Basophils Relative: 0.6 % (ref 0.0–3.0)
Eosinophils Absolute: 0.7 10*3/uL (ref 0.0–0.7)
Eosinophils Relative: 9.9 % — ABNORMAL HIGH (ref 0.0–5.0)
HCT: 43.7 % (ref 36.0–46.0)
Hemoglobin: 13.9 g/dL (ref 12.0–15.0)
Lymphocytes Relative: 34.3 % (ref 12.0–46.0)
Lymphs Abs: 2.6 10*3/uL (ref 0.7–4.0)
MCHC: 31.8 g/dL (ref 30.0–36.0)
MCV: 95 fl (ref 78.0–100.0)
Monocytes Absolute: 0.2 10*3/uL (ref 0.1–1.0)
Monocytes Relative: 3.2 % (ref 3.0–12.0)
Neutro Abs: 3.9 10*3/uL (ref 1.4–7.7)
Neutrophils Relative %: 52 % (ref 43.0–77.0)
Platelets: 220 10*3/uL (ref 150.0–400.0)
RBC: 4.59 Mil/uL (ref 3.87–5.11)
RDW: 13.9 % (ref 11.5–15.5)
WBC: 7.5 10*3/uL (ref 4.0–10.5)

## 2013-12-12 LAB — HEPATIC FUNCTION PANEL
ALT: 25 U/L (ref 0–35)
AST: 28 U/L (ref 0–37)
Albumin: 4.4 g/dL (ref 3.5–5.2)
Alkaline Phosphatase: 78 U/L (ref 39–117)
Bilirubin, Direct: 0.1 mg/dL (ref 0.0–0.3)
Total Bilirubin: 0.5 mg/dL (ref 0.2–1.2)
Total Protein: 6.9 g/dL (ref 6.0–8.3)

## 2013-12-12 LAB — BASIC METABOLIC PANEL
BUN: 20 mg/dL (ref 6–23)
CO2: 24 mEq/L (ref 19–32)
Calcium: 9 mg/dL (ref 8.4–10.5)
Chloride: 104 mEq/L (ref 96–112)
Creatinine, Ser: 0.7 mg/dL (ref 0.4–1.2)
GFR: 92.23 mL/min (ref >60.00)
Glucose, Bld: 64 mg/dL — ABNORMAL LOW (ref 70–99)
Potassium: 4.5 mEq/L (ref 3.5–5.1)
Sodium: 138 mEq/L (ref 135–145)

## 2013-12-12 LAB — URINALYSIS
Bilirubin Urine: NEGATIVE
Hgb urine dipstick: NEGATIVE
Ketones, ur: NEGATIVE
Leukocytes, UA: NEGATIVE
Nitrite: NEGATIVE
Specific Gravity, Urine: 1.015 (ref 1.000–1.030)
Total Protein, Urine: NEGATIVE
Urine Glucose: NEGATIVE
Urobilinogen, UA: 0.2 — AB (ref 0.0–1.0)
pH: 8.5 — AB (ref 5.0–8.0)

## 2013-12-12 LAB — TSH: TSH: 6.7 u[IU]/mL — ABNORMAL HIGH (ref 0.35–4.50)

## 2013-12-13 ENCOUNTER — Other Ambulatory Visit: Payer: Self-pay | Admitting: Internal Medicine

## 2013-12-13 DIAGNOSIS — E038 Other specified hypothyroidism: Secondary | ICD-10-CM

## 2013-12-13 MED ORDER — LEVOTHYROXINE SODIUM 25 MCG PO TABS: ORAL_TABLET | ORAL | Status: DC

## 2013-12-14 ENCOUNTER — Encounter: Payer: Self-pay | Admitting: Internal Medicine

## 2014-01-09 ENCOUNTER — Telehealth: Payer: Self-pay | Admitting: Internal Medicine

## 2014-01-09 NOTE — Telephone Encounter (Signed)
Pt want to know if she need to get the lab work for her TSH done few day before her appt on 01/13/14. Please advise.

## 2014-01-09 NOTE — Telephone Encounter (Signed)
Yes TSH order was in Thx

## 2014-01-10 ENCOUNTER — Other Ambulatory Visit (INDEPENDENT_AMBULATORY_CARE_PROVIDER_SITE_OTHER): Payer: Medicare Other

## 2014-01-10 DIAGNOSIS — E038 Other specified hypothyroidism: Secondary | ICD-10-CM

## 2014-01-10 LAB — TSH: TSH: 3.38 u[IU]/mL (ref 0.35–4.50)

## 2014-01-10 NOTE — Telephone Encounter (Signed)
Send pt a massage through my chart.

## 2014-01-11 ENCOUNTER — Encounter: Payer: Self-pay | Admitting: Internal Medicine

## 2014-01-11 ENCOUNTER — Other Ambulatory Visit: Payer: Self-pay | Admitting: Internal Medicine

## 2014-01-11 MED ORDER — LEVOFLOXACIN 500 MG PO TABS: 500.0000 mg | ORAL_TABLET | Freq: Every day | ORAL | Status: DC

## 2014-01-13 ENCOUNTER — Ambulatory Visit (INDEPENDENT_AMBULATORY_CARE_PROVIDER_SITE_OTHER): Payer: Medicare Other | Admitting: Internal Medicine

## 2014-01-13 ENCOUNTER — Encounter: Payer: Self-pay | Admitting: Internal Medicine

## 2014-01-13 VITALS — BP 100/80 | HR 61 | Temp 97.7°F | Resp 12 | Ht 62.0 in | Wt 158.0 lb

## 2014-01-13 DIAGNOSIS — J0100 Acute maxillary sinusitis, unspecified: Secondary | ICD-10-CM

## 2014-01-13 DIAGNOSIS — E039 Hypothyroidism, unspecified: Secondary | ICD-10-CM | POA: Insufficient documentation

## 2014-01-13 DIAGNOSIS — E034 Atrophy of thyroid (acquired): Secondary | ICD-10-CM

## 2014-01-13 DIAGNOSIS — J019 Acute sinusitis, unspecified: Secondary | ICD-10-CM | POA: Insufficient documentation

## 2014-01-13 DIAGNOSIS — E038 Other specified hypothyroidism: Secondary | ICD-10-CM

## 2014-01-13 MED ORDER — LEVOTHYROXINE SODIUM 75 MCG PO TABS: 75.0000 ug | ORAL_TABLET | Freq: Every day | ORAL | Status: DC

## 2014-01-13 NOTE — Progress Notes (Signed)
   Subjective:    HPI  C/o cold sx's x 1 wk Pt started Levaquin on Tue F/u hypothyroidism   BP Readings from Last 3 Encounters:  01/13/14 100/80  12/09/13 112/80  09/26/13 120/78   Wt Readings from Last 3 Encounters:  01/13/14 158 lb (71.668 kg)  12/09/13 156 lb (70.761 kg)  09/26/13 157 lb (71.215 kg)       Review of Systems  Constitutional: Negative.  Negative for fever, chills, diaphoresis, activity change, appetite change, fatigue and unexpected weight change.  HENT: Negative for congestion, ear pain, facial swelling, hearing loss, mouth sores, nosebleeds, postnasal drip, rhinorrhea, sinus pressure, sneezing, sore throat, tinnitus and trouble swallowing.   Eyes: Negative for pain, discharge, redness, itching and visual disturbance.  Respiratory: Negative for cough, chest tightness, shortness of breath, wheezing and stridor.   Cardiovascular: Negative for chest pain, palpitations and leg swelling.  Gastrointestinal: Negative for nausea, abdominal pain, diarrhea, constipation, blood in stool, abdominal distention, anal bleeding and rectal pain.  Genitourinary: Negative for dysuria, urgency, frequency, hematuria, flank pain (right), vaginal bleeding, vaginal discharge, difficulty urinating, genital sores, vaginal pain and pelvic pain.  Musculoskeletal: Negative for back pain, joint swelling, arthralgias, gait problem, neck pain and neck stiffness.  Skin: Negative.  Negative for pallor and rash.  Neurological: Negative for dizziness, tremors, seizures, syncope, speech difficulty, weakness, numbness and headaches.  Hematological: Negative for adenopathy. Does not bruise/bleed easily.  Psychiatric/Behavioral: Negative for suicidal ideas, behavioral problems, confusion, sleep disturbance, dysphoric mood and decreased concentration. The patient is not nervous/anxious.        Objective:   Physical Exam  Constitutional: She appears well-developed. No distress.  HENT:  Head:  Normocephalic.  Right Ear: External ear normal.  Left Ear: External ear normal.  Nose: Nose normal.  Mouth/Throat: Oropharynx is clear and moist.  Eyes: Conjunctivae are normal. Pupils are equal, round, and reactive to light. Right eye exhibits no discharge. Left eye exhibits no discharge.  Neck: Normal range of motion. Neck supple. No JVD present. No tracheal deviation present. No thyromegaly present.  Cardiovascular: Normal rate, regular rhythm and normal heart sounds.   Pulmonary/Chest: No stridor. No respiratory distress. She has no wheezes.  Abdominal: Soft. Bowel sounds are normal. She exhibits no distension and no mass. There is no tenderness. There is no rebound and no guarding.  Musculoskeletal: She exhibits no edema or tenderness.  Lymphadenopathy:    She has no cervical adenopathy.  Neurological: She displays normal reflexes. No cranial nerve deficit. She exhibits normal muscle tone. Coordination normal.  Skin: No rash noted. No erythema.  Psychiatric: She has a normal mood and affect. Her behavior is normal. Judgment and thought content normal.  eryth nasal mucosa  Lab Results  Component Value Date   WBC 7.5 12/12/2013   HGB 13.9 12/12/2013   HCT 43.7 12/12/2013   PLT 220.0 12/12/2013   GLUCOSE 64* 12/12/2013   CHOL 233* 12/12/2013   TRIG 120.0 12/12/2013   HDL 71.50 12/12/2013   LDLDIRECT 142.6 07/09/2011   LDLCALC 138* 12/12/2013   ALT 25 12/12/2013   AST 28 12/12/2013   NA 138 12/12/2013   K 4.5 12/12/2013   CL 104 12/12/2013   CREATININE 0.7 12/12/2013   BUN 20 12/12/2013   CO2 24 12/12/2013   TSH 3.38 01/10/2014   HGBA1C 5.6 10/12/2006           Assessment & Plan:

## 2014-01-13 NOTE — Addendum Note (Signed)
Addended by: Jerl Santos R on: 01/13/2014 11:42 AM   Modules accepted: Medications

## 2014-01-13 NOTE — Assessment & Plan Note (Signed)
On 75 mcg a day Labs in 3 mo

## 2014-01-13 NOTE — Assessment & Plan Note (Signed)
On Levaquin

## 2014-01-17 ENCOUNTER — Encounter: Payer: Self-pay | Admitting: Internal Medicine

## 2014-01-17 ENCOUNTER — Other Ambulatory Visit: Payer: Self-pay | Admitting: *Deleted

## 2014-01-17 MED ORDER — LEVOFLOXACIN 500 MG PO TABS: 500.0000 mg | ORAL_TABLET | Freq: Every day | ORAL | Status: DC

## 2014-01-17 NOTE — Telephone Encounter (Signed)
PT OK REFILL FOR LEVAQUIN SEE MYCHART MSG....Ellen [name]

## 2014-02-13 ENCOUNTER — Ambulatory Visit (INDEPENDENT_AMBULATORY_CARE_PROVIDER_SITE_OTHER): Payer: Medicare Other | Admitting: Family

## 2014-02-13 ENCOUNTER — Encounter: Payer: Self-pay | Admitting: Family

## 2014-02-13 VITALS — BP 140/80 | HR 78 | Temp 98.1°F | Resp 18 | Ht 62.0 in | Wt 158.0 lb

## 2014-02-13 DIAGNOSIS — H109 Unspecified conjunctivitis: Secondary | ICD-10-CM | POA: Insufficient documentation

## 2014-02-13 MED ORDER — ERYTHROMYCIN 5 MG/GM OP OINT: 1.0000 "application " | TOPICAL_OINTMENT | Freq: Four times a day (QID) | OPHTHALMIC | Status: DC

## 2014-02-13 NOTE — Assessment & Plan Note (Signed)
Symptoms and exam consistent with conjunctivitis. Start erythromycin. Follow up if symptoms worsen or fail to improve.

## 2014-02-13 NOTE — Patient Instructions (Signed)

## 2014-02-13 NOTE — Progress Notes (Signed)
Pre visit review using our clinic review tool, if applicable. No additional management support is needed unless otherwise documented below in the visit note. 

## 2014-02-13 NOTE — Progress Notes (Signed)
Subjective:    Patient ID: Ellen [name], female    DOB: 03-25-1950, 64 y.o.   MRN: [id number]  Chief Complaint  Patient presents with  . Eye irritation    saturday morning left eye was hurting and she tried an OTC eye drop, seemed to help, came back this morning with the same pain and woke her up, redness in both eyes, did have crust around eye when waking up    HPI:  Ellen [name] is a 64 y.o. female who presents today for an acute visit.   Associated symptoms of sharp left eye irritation started approximately 3 days ago. She has tried OTC eye drops which provided some relief. This morning she notes some pain and redness in both eyes and some crust around them. States she used to work   Allergies  Allergen Reactions  . Belviq [Lorcaserin Hcl]     palpitations  . Demerol [Meperidine]     palpitations    Current Outpatient Prescriptions on File Prior to Visit  Medication Sig Dispense Refill  . antiseptic oral rinse (BIOTENE) LIQD 15 mLs by Mouth Rinse route as needed for dry mouth.    . Calcium Carbonate-Vitamin D (CALCIUM + D PO) Take by mouth.    . citalopram (CELEXA) 10 MG tablet TAKE ONE TABLET BY MOUTH ONCE DAILY 90 tablet 3  . Cranberry 1000 MG CAPS Take 1 capsule by mouth daily.      . Fluticasone Furoate-Vilanterol (BREO ELLIPTA) 100-25 MCG/INH AEPB Inhale 1 Act into the lungs daily. 1 each 5  . levofloxacin (LEVAQUIN) 500 MG tablet Take 1 tablet (500 mg total) by mouth daily. 7 tablet 0  . levothyroxine (SYNTHROID, LEVOTHROID) 75 MCG tablet Take 1 tablet (75 mcg total) by mouth daily. 90 tablet 3  . Lysine 1000 MG TABS Take 1,000 mg by mouth once.    . Melatonin 10 MG TABS Take by mouth at bedtime and may repeat dose one time if needed.    . Multiple Vitamin (MULTIVITAMIN) tablet Take 1 tablet by mouth daily.    . NON FORMULARY Allerfex  1 by mouth once daily as needed    . omeprazole (PRILOSEC) 40 MG capsule Take 1 capsule (40 mg total) by mouth daily. 90 capsule 3   . Probiotic Product ([place]) Take by mouth daily.    [name] Specialty Vitamins Products (MAGNESIUM, AMINO ACID CHELATE,) 133 MG tablet Take 1 tablet by mouth 2 (two) times daily.    Cristino Martes Root 100 MG CAPS Take 1 capsule by mouth daily.      [name] zinc gluconate 50 MG tablet Take 50 mg by mouth daily.     No current facility-administered medications on file prior to visit.    Review of Systems  Constitutional: Negative for fever and chills.  Eyes: Positive for pain, discharge and redness. Negative for photophobia, itching and visual disturbance.       Negative for changes in vision.      Objective:    BP 140/80 mmHg  Pulse 78  Temp(Src) 98.1 F (36.7 C) (Oral)  Resp 18  Ht 5\' 2"  (1.575 m)  Wt 158 lb (71.668 kg)  BMI 28.89 kg/m2  SpO2 98% Nursing note and vital signs reviewed.  Physical Exam  Constitutional: She is oriented to person, place, and time. She appears well-developed and well-nourished. No distress.  Eyes: EOM and lids are normal. Pupils are equal, round, and reactive to light. Lids are everted and swept,  no foreign bodies found. Left conjunctiva is injected.  Cardiovascular: Normal rate, regular rhythm, normal heart sounds and intact distal pulses.   Pulmonary/Chest: Effort normal and breath sounds normal.  Neurological: She is alert and oriented to person, place, and time.  Skin: Skin is warm and dry.  Psychiatric: She has a normal mood and affect. Her behavior is normal. Judgment and thought content normal.       Assessment & Plan:

## 2014-04-06 ENCOUNTER — Other Ambulatory Visit (INDEPENDENT_AMBULATORY_CARE_PROVIDER_SITE_OTHER): Payer: Medicare Other

## 2014-04-06 ENCOUNTER — Encounter: Payer: Self-pay | Admitting: Internal Medicine

## 2014-04-06 DIAGNOSIS — E038 Other specified hypothyroidism: Secondary | ICD-10-CM | POA: Diagnosis not present

## 2014-04-06 DIAGNOSIS — E034 Atrophy of thyroid (acquired): Secondary | ICD-10-CM

## 2014-04-06 LAB — TSH: TSH: 3.66 u[IU]/mL (ref 0.35–4.50)

## 2014-04-06 LAB — T4, FREE: Free T4: 1.23 ng/dL (ref 0.60–1.60)

## 2014-04-10 ENCOUNTER — Ambulatory Visit: Payer: Medicare Other | Admitting: Internal Medicine

## 2014-07-04 ENCOUNTER — Encounter: Payer: Self-pay | Admitting: Internal Medicine

## 2014-07-05 ENCOUNTER — Other Ambulatory Visit: Payer: Self-pay | Admitting: Internal Medicine

## 2014-07-05 MED ORDER — CITALOPRAM HYDROBROMIDE 20 MG PO TABS: 20.0000 mg | ORAL_TABLET | Freq: Every day | ORAL | Status: DC

## 2014-07-13 ENCOUNTER — Encounter: Payer: Self-pay | Admitting: Internal Medicine

## 2014-07-14 ENCOUNTER — Encounter: Payer: Self-pay | Admitting: Internal Medicine

## 2014-08-01 ENCOUNTER — Encounter: Payer: Self-pay | Admitting: Internal Medicine

## 2014-08-02 ENCOUNTER — Other Ambulatory Visit: Payer: Self-pay | Admitting: Internal Medicine

## 2014-08-02 ENCOUNTER — Encounter: Payer: Self-pay | Admitting: Internal Medicine

## 2014-08-02 MED ORDER — JAPAN ENCEPHALITIS VIRUS VACC ~~LOC~~ SOLR: 0.5000 mL | Freq: Once | SUBCUTANEOUS | Status: DC

## 2014-08-03 ENCOUNTER — Other Ambulatory Visit: Payer: Self-pay | Admitting: *Deleted

## 2014-08-03 MED ORDER — JAPAN ENCEPHALITIS VIRUS VACC ~~LOC~~ SOLR: 0.5000 mL | Freq: Once | SUBCUTANEOUS | Status: DC

## 2014-08-14 ENCOUNTER — Telehealth: Payer: Self-pay | Admitting: Internal Medicine

## 2014-08-17 NOTE — Telephone Encounter (Signed)
Patient is following up on this request. She has not seen a response. Please update via mychart.  Original authorizing provider: Walker Kehr, MD   Ellen [name] would like a refill of the following medications:  levofloxacin (LEVAQUIN) 500 MG tablet Walker Kehr, MD]   Preferred pharmacy: [facility] [place], [place] - [address]   Comment:  Hi, I have changed my pharmacy back to [facility] and I need to request that you order my  Levothyroxine 75 MCG (90 day) from there. I am almost out.   Also if possible, I would like to get a refill of the Levofloxacin to take with [place] on the trip to [place] to visit our kids & grands.  We have appts at your office for our flu shots in Sept.   Thank you! Thank you!!

## 2014-08-18 ENCOUNTER — Encounter: Payer: Self-pay | Admitting: Family

## 2014-08-18 ENCOUNTER — Ambulatory Visit (INDEPENDENT_AMBULATORY_CARE_PROVIDER_SITE_OTHER): Payer: Medicare Other | Admitting: Family

## 2014-08-18 ENCOUNTER — Encounter: Payer: Self-pay | Admitting: Internal Medicine

## 2014-08-18 VITALS — BP 128/88 | HR 61 | Temp 98.2°F | Resp 18 | Ht 62.0 in | Wt 154.0 lb

## 2014-08-18 DIAGNOSIS — J0100 Acute maxillary sinusitis, unspecified: Secondary | ICD-10-CM | POA: Diagnosis not present

## 2014-08-18 MED ORDER — AMOXICILLIN-POT CLAVULANATE 875-125 MG PO TABS: 1.0000 | ORAL_TABLET | Freq: Two times a day (BID) | ORAL | Status: DC

## 2014-08-18 MED ORDER — LEVOTHYROXINE SODIUM 75 MCG PO TABS: 75.0000 ug | ORAL_TABLET | Freq: Every day | ORAL | Status: DC

## 2014-08-18 MED ORDER — LEVOFLOXACIN 500 MG PO TABS: 500.0000 mg | ORAL_TABLET | Freq: Every day | ORAL | Status: DC

## 2014-08-18 NOTE — Progress Notes (Signed)
Pre visit review using our clinic review tool, if applicable. No additional management support is needed unless otherwise documented below in the visit note. 

## 2014-08-18 NOTE — Patient Instructions (Addendum)
Thank you for choosing [facility].  Summary/Instructions:  Your prescription(s) have been submitted to your pharmacy or been printed and provided for you. Please take as directed and contact our office if you believe you are having problem(s) with the medication(s) or have any questions.  If your symptoms worsen or fail to improve, please contact our office for further instruction, or in case of emergency go directly to the emergency room at the closest medical facility.   Sinusitis Sinusitis is redness, soreness, and inflammation of the paranasal sinuses. Paranasal sinuses are air pockets within the bones of your face (beneath the eyes, the middle of the forehead, or above the eyes). In healthy paranasal sinuses, mucus is able to drain out, and air is able to circulate through them by way of your nose. However, when your paranasal sinuses are inflamed, mucus and air can become trapped. This can allow bacteria and other germs to grow and cause infection. Sinusitis can develop quickly and last only a short time (acute) or continue over a long period (chronic). Sinusitis that lasts for more than 12 weeks is considered chronic.  CAUSES  Causes of sinusitis include: 1. Allergies. 2. Structural abnormalities, such as displacement of the cartilage that separates your nostrils (deviated septum), which can decrease the air flow through your nose and sinuses and affect sinus drainage. 3. Functional abnormalities, such as when the small hairs (cilia) that line your sinuses and help remove mucus do not work properly or are not present. SIGNS AND SYMPTOMS  Symptoms of acute and chronic sinusitis are the same. The primary symptoms are pain and pressure around the affected sinuses. Other symptoms include: 1. Upper toothache. 2. Earache. 3. Headache. 4. Bad breath. 5. Decreased sense of smell and taste. 6. A cough, which worsens when you are lying flat. 7. Fatigue. 8. Fever. 9. Thick drainage from  your nose, which often is green and may contain pus (purulent). 10. Swelling and warmth over the affected sinuses. DIAGNOSIS  Your health care provider will perform a physical exam. During the exam, your health care provider may: 1. Look in your nose for signs of abnormal growths in your nostrils (nasal polyps). 2. Tap over the affected sinus to check for signs of infection. 3. View the inside of your sinuses (endoscopy) using an imaging device that has a light attached (endoscope). If your health care provider suspects that you have chronic sinusitis, one or more of the following tests may be recommended: 1. Allergy tests. 2. Nasal culture. A sample of mucus is taken from your nose, sent to a lab, and screened for bacteria. 3. Nasal cytology. A sample of mucus is taken from your nose and examined by your health care provider to determine if your sinusitis is related to an allergy. TREATMENT  Most cases of acute sinusitis are related to a viral infection and will resolve on their own within 10 days. Sometimes medicines are prescribed to help relieve symptoms (pain medicine, decongestants, nasal steroid sprays, or saline sprays).  However, for sinusitis related to a bacterial infection, your health care provider will prescribe antibiotic medicines. These are medicines that will help kill the bacteria causing the infection.  Rarely, sinusitis is caused by a fungal infection. In theses cases, your health care provider will prescribe antifungal medicine. For some cases of chronic sinusitis, surgery is needed. Generally, these are cases in which sinusitis recurs more than 3 times per year, despite other treatments. HOME CARE INSTRUCTIONS  1. Drink plenty of water. Water helps  thin the mucus so your sinuses can drain more easily. 2. Use a humidifier. 3. Inhale steam 3 to 4 times a day (for example, sit in the bathroom with the shower running). 4. Apply a warm, moist washcloth to your face 3 to 4 times  a day, or as directed by your health care provider. 5. Use saline nasal sprays to help moisten and clean your sinuses. 6. Take medicines only as directed by your health care provider. 7. If you were prescribed either an antibiotic or antifungal medicine, finish it all even if you start to feel better. SEEK IMMEDIATE MEDICAL CARE IF: 1. You have increasing pain or severe headaches. 2. You have nausea, vomiting, or drowsiness. 3. You have swelling around your face. 4. You have vision problems. 5. You have a stiff neck. 6. You have difficulty breathing. MAKE SURE YOU:   Understand these instructions.  Will watch your condition.  Will get help right away if you are not doing well or get worse. Document Released: 12/23/2004 Document Revised: 05/09/2013 Document Reviewed: 01/07/2011 [facility] Patient Information 2015 [place], [place]. This information is not intended to replace advice given to you by your health care provider. Make sure you discuss any questions you have with your health care provider.  General Recommendations:    Please drink plenty of fluids.  Get plenty of rest   Sleep in humidified air  Use saline nasal sprays  Netti pot   OTC Medications:  Decongestants - helps relieve congestion   Flonase (generic fluticasone) or Nasacort (generic triamcinolone) - please make sure to use the "cross-over" technique at a 45 degree angle towards the opposite eye as opposed to straight up the nasal passageway.   Sudafed (generic pseudoephedrine - Note this is the one that is available behind the pharmacy counter); Products with phenylephrine (-PE) may also be used but is often not as effective as pseudoephedrine.   If you have HIGH BLOOD PRESSURE - Coricidin HBP; AVOID any product that is -D as this contains pseudoephedrine which may increase your blood pressure.  Afrin (oxymetazoline) every 6-8 hours for up to 3 days.   Allergies - helps relieve runny nose, itchy eyes and  sneezing   Claritin (generic loratidine), Allegra (fexofenidine), or Zyrtec (generic cyrterizine) for runny nose. These medications should not cause drowsiness.  Note - Benadryl (generic diphenhydramine) may be used however may cause drowsiness  Cough -   Delsym or Robitussin (generic dextromethorphan)  Expectorants - helps loosen mucus to ease removal   Mucinex (generic guaifenesin) as directed on the package.  Headaches / General Aches   Tylenol (generic acetaminophen) - DO NOT EXCEED 3 grams (3,000 mg) in a 24 hour time period  Advil/Motrin (generic ibuprofen)   Sore Throat -   Salt water gargle   Chloraseptic (generic benzocaine) spray or lozenges / Sucrets (generic dyclonine)

## 2014-08-18 NOTE — Telephone Encounter (Signed)
Ok to Rf levaquin?

## 2014-08-18 NOTE — Progress Notes (Signed)
Subjective:    Patient ID: Ellen [name], female    DOB: [birth year], 64 y.o.   MRN: [id number]  Chief Complaint  Patient presents with  . Sore Throat    x1 week, sore throat, headache, sinus pressure and ears popping    HPI:  Ellen [name] is a 64 y.o. female with a PMH of hypertension, GERD, hypothyroidism, urinary tract infection, hyperlipidemia, anxiety, and depression who presents today for an acute office visit.   1.) Sore throat - Associated symptoms of sore throat, headache, sinus pressure and ears popping has been going on for over 1 week. Modifying factors dayquil, nyquil, and warm compresses which has helped a little. Course of the condition has progressively worsened. She has recently used levaquin about 3 months ago.    Allergies  Allergen Reactions  . Belviq [Lorcaserin Hcl]     palpitations  . Demerol [Meperidine]     palpitations    Current Outpatient Prescriptions on File Prior to Visit  Medication Sig Dispense Refill  . antiseptic oral rinse (BIOTENE) LIQD 15 mLs by Mouth Rinse route as needed for dry mouth.    . Calcium Carbonate-Vitamin D (CALCIUM + D PO) Take by mouth.    . citalopram (CELEXA) 20 MG tablet Take 1 tablet (20 mg total) by mouth daily. 30 tablet 11  . Cranberry 1000 MG CAPS Take 1 capsule by mouth daily.      [name] erythromycin ophthalmic ointment Place 1 application into the left eye 4 (four) times daily. 3.5 g 0  . Fluticasone Furoate-Vilanterol (BREO ELLIPTA) 100-25 MCG/INH AEPB Inhale 1 Act into the lungs daily. 1 each 5  . [place] encephalitis (JE-VAX) injection Inject 0.5 mLs into the skin once. Repeat in 4 weeks 2 each 0  . levofloxacin (LEVAQUIN) 500 MG tablet Take 1 tablet (500 mg total) by mouth daily. 7 tablet 0  . Lysine 1000 MG TABS Take 1,000 mg by mouth once.    . Melatonin 10 MG TABS Take 2 mg by mouth at bedtime and may repeat dose one time if needed.     . Multiple Vitamin (MULTIVITAMIN) tablet Take 1 tablet by mouth daily.    .  NON FORMULARY Allerfex  1 by mouth once daily as needed    . Probiotic Product ([place]) Take by mouth daily.    [name] Specialty Vitamins Products (MAGNESIUM, AMINO ACID CHELATE,) 133 MG tablet Take 1 tablet by mouth 2 (two) times daily.    Cristino Martes Root 100 MG CAPS Take 1 capsule by mouth daily.      [name] zinc gluconate 50 MG tablet Take 50 mg by mouth daily.     No current facility-administered medications on file prior to visit.    Review of Systems  Constitutional: Positive for chills. Negative for fever.  HENT: Positive for congestion, ear pain, sinus pressure and sore throat.   Respiratory: Positive for cough. Negative for chest tightness, shortness of breath and wheezing.   Neurological: Positive for headaches.      Objective:    BP 128/88 mmHg  Pulse 61  Temp(Src) 98.2 F (36.8 C) (Oral)  Resp 18  Ht 5\' 2"  (1.575 m)  Wt 154 lb (69.854 kg)  BMI 28.16 kg/m2  SpO2 99% Nursing note and vital signs reviewed.  Physical Exam  Constitutional: She is oriented to person, place, and time. She appears well-developed and well-nourished. No distress.  HENT:  Right Ear: Hearing, tympanic membrane, external ear and ear canal  normal.  Left Ear: Hearing, tympanic membrane, external ear and ear canal normal.  Nose: Right sinus exhibits maxillary sinus tenderness. Right sinus exhibits no frontal sinus tenderness. Left sinus exhibits maxillary sinus tenderness. Left sinus exhibits no frontal sinus tenderness.  Mouth/Throat: Uvula is midline, oropharynx is clear and moist and mucous membranes are normal.  Cardiovascular: Normal rate, regular rhythm, normal heart sounds and intact distal pulses.   Pulmonary/Chest: Effort normal and breath sounds normal.  Lymphadenopathy:    She has no cervical adenopathy.  Neurological: She is alert and oriented to person, place, and time.  Skin: Skin is warm and dry.  Psychiatric: She has a normal mood and affect. Her behavior is normal.  Judgment and thought content normal.       Assessment & Plan:   Problem List Items Addressed This Visit      Respiratory   Sinusitis, acute - Primary    Symptoms and exam consistent with bacterial sinusitis. Start Augmentin. Continue over-the-counter medications as needed for symptom relief and supportive care. Follow-up if symptoms worsen or fail to improve.      Relevant Medications   amoxicillin-clavulanate (AUGMENTIN) 875-125 MG per tablet

## 2014-08-18 NOTE — Addendum Note (Signed)
Addended by: Cresenciano Lick on: 08/18/2014 04:01 PM   Modules accepted: Orders

## 2014-08-18 NOTE — Assessment & Plan Note (Signed)
Symptoms and exam consistent with bacterial sinusitis. Start Augmentin. Continue over-the-counter medications as needed for symptom relief and supportive care. Follow-up if symptoms worsen or fail to improve.

## 2014-08-18 NOTE — Telephone Encounter (Signed)
Rxs sent. See meds. .Pt informed via MyChart

## 2014-09-28 ENCOUNTER — Ambulatory Visit (INDEPENDENT_AMBULATORY_CARE_PROVIDER_SITE_OTHER): Payer: Medicare Other | Admitting: Family

## 2014-09-28 ENCOUNTER — Other Ambulatory Visit: Payer: Self-pay | Admitting: Internal Medicine

## 2014-09-28 ENCOUNTER — Encounter: Payer: Self-pay | Admitting: Family

## 2014-09-28 VITALS — BP 132/82 | HR 58 | Temp 97.6°F | Resp 18 | Ht 62.0 in | Wt 154.8 lb

## 2014-09-28 DIAGNOSIS — Z23 Encounter for immunization: Secondary | ICD-10-CM | POA: Diagnosis not present

## 2014-09-28 DIAGNOSIS — R05 Cough: Secondary | ICD-10-CM | POA: Diagnosis not present

## 2014-09-28 DIAGNOSIS — R059 Cough, unspecified: Secondary | ICD-10-CM

## 2014-09-28 MED ORDER — CEFUROXIME AXETIL 500 MG PO TABS: 500.0000 mg | ORAL_TABLET | Freq: Two times a day (BID) | ORAL | Status: DC

## 2014-09-28 NOTE — Progress Notes (Signed)
Subjective:    Patient ID: Ellen [name], female    DOB: [birth year], 64 y.o.   MRN: [id number]  Chief Complaint  Patient presents with  . Otalgia    ears have been bothering her, wheezing, chills, and low grade fever    HPI:  Ellen [name] is a 64 y.o. female who  has a past medical history of HYPERLIPIDEMIA; INSOMNIA-SLEEP DISORDER-UNSPEC; ALLERGIC RHINITIS; GERD; DEPRESSION; and ANXIETY. and presents today for an acute office visit.   This is a new problem. Associated symptom of ear pain, wheezing, chills, diarrhea and low grade fever has been going on for about 1 week. Modifying factors Mucinex, ibuprofen, decongestants, and Dayquil and Nyquil. Temperature max was 99.5. Just completed two weeks of cycling. Recently completed a course of Augmentin for her sinusitis and completely recovered from that. Reports feeling a little improvement today.   Allergies  Allergen Reactions  . Belviq [Lorcaserin Hcl]     palpitations  . Demerol [Meperidine]     palpitations     Current Outpatient Prescriptions on File Prior to Visit  Medication Sig Dispense Refill  . antiseptic oral rinse (BIOTENE) LIQD 15 mLs by Mouth Rinse route as needed for dry mouth.    . Calcium Carbonate-Vitamin D (CALCIUM + D PO) Take by mouth.    . citalopram (CELEXA) 20 MG tablet Take 1 tablet (20 mg total) by mouth daily. 30 tablet 11  . Cranberry 1000 MG CAPS Take 1 capsule by mouth daily.      [name] erythromycin ophthalmic ointment Place 1 application into the left eye 4 (four) times daily. 3.5 g 0  . Fluticasone Furoate-Vilanterol (BREO ELLIPTA) 100-25 MCG/INH AEPB Inhale 1 Act into the lungs daily. 1 each 5  . [place] encephalitis (JE-VAX) injection Inject 0.5 mLs into the skin once. Repeat in 4 weeks 2 each 0  . levofloxacin (LEVAQUIN) 500 MG tablet Take 1 tablet (500 mg total) by mouth daily. 7 tablet 0  . levothyroxine (SYNTHROID, LEVOTHROID) 75 MCG tablet Take 1 tablet (75 mcg total) by mouth daily. 90 tablet 3    . Lysine 1000 MG TABS Take 1,000 mg by mouth once.    . Melatonin 10 MG TABS Take 2 mg by mouth at bedtime and may repeat dose one time if needed.     . Multiple Vitamin (MULTIVITAMIN) tablet Take 1 tablet by mouth daily.    . NON FORMULARY Allerfex  1 by mouth once daily as needed    . Probiotic Product ([place]) Take by mouth daily.    [name] Specialty Vitamins Products (MAGNESIUM, AMINO ACID CHELATE,) 133 MG tablet Take 1 tablet by mouth 2 (two) times daily.    Cristino Martes Root 100 MG CAPS Take 1 capsule by mouth daily.      [name] zinc gluconate 50 MG tablet Take 50 mg by mouth daily.     No current facility-administered medications on file prior to visit.     Past Surgical History  Procedure Laterality Date  . Appendectomy    . Facelift      s/p  . Upper nl tsh w/sx    . Cesarean section      twice  . Tonsillectomy  1974  . Breast surgery      REDUCTION MAMMOPLASTY  . Ankle fracture surgery  2012    R  . Vaginal hysterectomy  1991    TVH, irregular bleeding  . Cosmetic surgery  Review of Systems  Constitutional: Positive for fever and chills.  HENT: Positive for congestion, ear pain and sore throat. Negative for sinus pressure.   Respiratory: Positive for cough and wheezing.   Gastrointestinal: Positive for diarrhea. Negative for nausea and vomiting.  Neurological: Positive for headaches.       Objective:    BP 132/82 mmHg  Pulse 58  Temp(Src) 97.6 F (36.4 C) (Oral)  Resp 18  Ht 5\' 2"  (1.575 m)  Wt 154 lb 12.8 oz (70.217 kg)  BMI 28.31 kg/m2  SpO2 97% Nursing note and vital signs reviewed.  Physical Exam  Constitutional: She is oriented to person, place, and time. She appears well-developed and well-nourished. No distress.  HENT:  Right Ear: Hearing, tympanic membrane, external ear and ear canal normal.  Left Ear: Hearing, tympanic membrane, external ear and ear canal normal.  Nose: Right sinus exhibits no maxillary sinus tenderness and  no frontal sinus tenderness. Left sinus exhibits no maxillary sinus tenderness and no frontal sinus tenderness.  Mouth/Throat: Uvula is midline, oropharynx is clear and moist and mucous membranes are normal.  Cardiovascular: Normal rate, regular rhythm, normal heart sounds and intact distal pulses.   Pulmonary/Chest: Effort normal and breath sounds normal.  Neurological: She is alert and oriented to person, place, and time.  Skin: Skin is warm and dry.  Psychiatric: She has a normal mood and affect. Her behavior is normal. Judgment and thought content normal.       Assessment & Plan:   Problem List Items Addressed This Visit      Other   Cough - Primary    Symptoms and exam consistent with upper respiratory infection that is most likely bacterial given no improvement of symptoms after 7 days. Start Ceftin. Continue over the counter medication as needed for symptom relief and supportive care.       Relevant Medications   cefUROXime (CEFTIN) 500 MG tablet    Other Visit Diagnoses    Encounter for immunization

## 2014-09-28 NOTE — Progress Notes (Signed)
Pre visit review using our clinic review tool, if applicable. No additional management support is needed unless otherwise documented below in the visit note. 

## 2014-09-28 NOTE — Assessment & Plan Note (Signed)
Symptoms and exam consistent with upper respiratory infection that is most likely bacterial given no improvement of symptoms after 7 days. Start Ceftin. Continue over the counter medication as needed for symptom relief and supportive care.

## 2014-09-28 NOTE — Patient Instructions (Addendum)
Thank you for choosing [facility].  Summary/Instructions:  Your prescription(s) have been submitted to your pharmacy or been printed and provided for you. Please take as directed and contact our office if you believe you are having problem(s) with the medication(s) or have any questions.  If your symptoms worsen or fail to improve, please contact our office for further instruction, or in case of emergency go directly to the emergency room at the closest medical facility.   General Recommendations:    Please drink plenty of fluids.  Get plenty of rest   Sleep in humidified air  Use saline nasal sprays  Netti pot   OTC Medications:  Decongestants - helps relieve congestion   Flonase (generic fluticasone) or Nasacort (generic triamcinolone) - please make sure to use the "cross-over" technique at a 45 degree angle towards the opposite eye as opposed to straight up the nasal passageway.   Sudafed (generic pseudoephedrine - Note this is the one that is available behind the pharmacy counter); Products with phenylephrine (-PE) may also be used but is often not as effective as pseudoephedrine.   If you have HIGH BLOOD PRESSURE - Coricidin HBP; AVOID any product that is -D as this contains pseudoephedrine which may increase your blood pressure.  Afrin (oxymetazoline) every 6-8 hours for up to 3 days.   Allergies - helps relieve runny nose, itchy eyes and sneezing   Claritin (generic loratidine), Allegra (fexofenidine), or Zyrtec (generic cyrterizine) for runny nose. These medications should not cause drowsiness.  Note - Benadryl (generic diphenhydramine) may be used however may cause drowsiness  Cough -   Delsym or Robitussin (generic dextromethorphan)  Expectorants - helps loosen mucus to ease removal   Mucinex (generic guaifenesin) as directed on the package.  Headaches / General Aches   Tylenol (generic acetaminophen) - DO NOT EXCEED 3 grams (3,000 mg) in a 24  hour time period  Advil/Motrin (generic ibuprofen)   Sore Throat -   Salt water gargle   Chloraseptic (generic benzocaine) spray or lozenges / Sucrets (generic dyclonine)      

## 2014-09-29 ENCOUNTER — Ambulatory Visit: Payer: Medicare Other

## 2014-10-05 ENCOUNTER — Encounter: Payer: Self-pay | Admitting: Family

## 2014-10-05 ENCOUNTER — Encounter: Payer: Self-pay | Admitting: Internal Medicine

## 2014-10-05 ENCOUNTER — Other Ambulatory Visit: Payer: Self-pay | Admitting: Internal Medicine

## 2014-10-05 ENCOUNTER — Other Ambulatory Visit: Payer: Self-pay | Admitting: Family

## 2014-10-05 DIAGNOSIS — R05 Cough: Secondary | ICD-10-CM

## 2014-10-05 DIAGNOSIS — R059 Cough, unspecified: Secondary | ICD-10-CM

## 2014-10-05 MED ORDER — DOXYCYCLINE HYCLATE 100 MG PO TABS: 100.0000 mg | ORAL_TABLET | Freq: Two times a day (BID) | ORAL | Status: DC

## 2014-10-05 MED ORDER — CEFUROXIME AXETIL 500 MG PO TABS: 500.0000 mg | ORAL_TABLET | Freq: Two times a day (BID) | ORAL | Status: DC

## 2014-12-14 ENCOUNTER — Other Ambulatory Visit (INDEPENDENT_AMBULATORY_CARE_PROVIDER_SITE_OTHER): Payer: Medicare Other

## 2014-12-14 ENCOUNTER — Encounter: Payer: Self-pay | Admitting: Internal Medicine

## 2014-12-14 ENCOUNTER — Encounter: Payer: Self-pay | Admitting: Gynecology

## 2014-12-14 ENCOUNTER — Ambulatory Visit (INDEPENDENT_AMBULATORY_CARE_PROVIDER_SITE_OTHER): Payer: Medicare Other | Admitting: Internal Medicine

## 2014-12-14 ENCOUNTER — Ambulatory Visit (INDEPENDENT_AMBULATORY_CARE_PROVIDER_SITE_OTHER): Payer: Medicare Other | Admitting: Gynecology

## 2014-12-14 VITALS — BP 120/74 | Ht 63.0 in | Wt 153.0 lb

## 2014-12-14 VITALS — BP 120/70 | HR 78 | Ht 62.0 in | Wt 154.0 lb

## 2014-12-14 DIAGNOSIS — E034 Atrophy of thyroid (acquired): Secondary | ICD-10-CM | POA: Diagnosis not present

## 2014-12-14 DIAGNOSIS — E038 Other specified hypothyroidism: Secondary | ICD-10-CM

## 2014-12-14 DIAGNOSIS — D485 Neoplasm of uncertain behavior of skin: Secondary | ICD-10-CM | POA: Insufficient documentation

## 2014-12-14 DIAGNOSIS — E039 Hypothyroidism, unspecified: Secondary | ICD-10-CM | POA: Diagnosis not present

## 2014-12-14 DIAGNOSIS — F411 Generalized anxiety disorder: Secondary | ICD-10-CM

## 2014-12-14 DIAGNOSIS — Z01419 Encounter for gynecological examination (general) (routine) without abnormal findings: Secondary | ICD-10-CM

## 2014-12-14 DIAGNOSIS — Z0000 Encounter for general adult medical examination without abnormal findings: Secondary | ICD-10-CM

## 2014-12-14 DIAGNOSIS — N952 Postmenopausal atrophic vaginitis: Secondary | ICD-10-CM | POA: Diagnosis not present

## 2014-12-14 LAB — URINALYSIS
Bilirubin Urine: NEGATIVE
Hgb urine dipstick: NEGATIVE
Ketones, ur: NEGATIVE
Leukocytes, UA: NEGATIVE
Nitrite: NEGATIVE
Specific Gravity, Urine: 1.01 (ref 1.000–1.030)
Total Protein, Urine: NEGATIVE
Urine Glucose: NEGATIVE
Urobilinogen, UA: 0.2 (ref 0.0–1.0)
pH: 8 (ref 5.0–8.0)

## 2014-12-14 LAB — TSH: TSH: 4.48 u[IU]/mL (ref 0.35–4.50)

## 2014-12-14 LAB — LIPID PANEL
Cholesterol: 205 mg/dL — ABNORMAL HIGH (ref 0–200)
HDL: 55.6 mg/dL (ref >39.00)
LDL Cholesterol: 118 mg/dL — ABNORMAL HIGH (ref 0–99)
NonHDL: 149.72
Total CHOL/HDL Ratio: 4
Triglycerides: 160 mg/dL — ABNORMAL HIGH (ref 0.0–149.0)
VLDL: 32 mg/dL (ref 0.0–40.0)

## 2014-12-14 LAB — BASIC METABOLIC PANEL
BUN: 15 mg/dL (ref 6–23)
CO2: 29 mEq/L (ref 19–32)
Calcium: 9.4 mg/dL (ref 8.4–10.5)
Chloride: 103 mEq/L (ref 96–112)
Creatinine, Ser: 0.72 mg/dL (ref 0.40–1.20)
GFR: 86.07 mL/min (ref >60.00)
Glucose, Bld: 94 mg/dL (ref 70–99)
Potassium: 4.3 mEq/L (ref 3.5–5.1)
Sodium: 139 mEq/L (ref 135–145)

## 2014-12-14 LAB — CBC WITH DIFFERENTIAL/PLATELET
Basophils Absolute: 0 10*3/uL (ref 0.0–0.1)
Basophils Relative: 0.5 % (ref 0.0–3.0)
Eosinophils Absolute: 1.5 10*3/uL — ABNORMAL HIGH (ref 0.0–0.7)
Eosinophils Relative: 17.5 % — ABNORMAL HIGH (ref 0.0–5.0)
HCT: 43.5 % (ref 36.0–46.0)
Hemoglobin: 14.3 g/dL (ref 12.0–15.0)
Lymphocytes Relative: 32.8 % (ref 12.0–46.0)
Lymphs Abs: 2.8 10*3/uL (ref 0.7–4.0)
MCHC: 32.9 g/dL (ref 30.0–36.0)
MCV: 89.5 fl (ref 78.0–100.0)
Monocytes Absolute: 0.7 10*3/uL (ref 0.1–1.0)
Monocytes Relative: 8.6 % (ref 3.0–12.0)
Neutro Abs: 3.4 10*3/uL (ref 1.4–7.7)
Neutrophils Relative %: 40.6 % — ABNORMAL LOW (ref 43.0–77.0)
Platelets: 236 10*3/uL (ref 150.0–400.0)
RBC: 4.87 Mil/uL (ref 3.87–5.11)
RDW: 14.7 % (ref 11.5–15.5)
WBC: 8.4 10*3/uL (ref 4.0–10.5)

## 2014-12-14 LAB — HEPATIC FUNCTION PANEL
ALT: 19 U/L (ref 0–35)
AST: 24 U/L (ref 0–37)
Albumin: 4.3 g/dL (ref 3.5–5.2)
Alkaline Phosphatase: 83 U/L (ref 39–117)
Bilirubin, Direct: 0.1 mg/dL (ref 0.0–0.3)
Total Bilirubin: 0.4 mg/dL (ref 0.2–1.2)
Total Protein: 7.1 g/dL (ref 6.0–8.3)

## 2014-12-14 MED ORDER — CITALOPRAM HYDROBROMIDE 20 MG PO TABS: 20.0000 mg | ORAL_TABLET | Freq: Every day | ORAL | Status: DC

## 2014-12-14 MED ORDER — LEVOTHYROXINE SODIUM 75 MCG PO TABS: 75.0000 ug | ORAL_TABLET | Freq: Every day | ORAL | Status: DC

## 2014-12-14 NOTE — Progress Notes (Signed)
Ellen [name] 06-11-1950 [id number]        64 y.o.  [id number]  No LMP recorded. Patient has had a hysterectomy. for breast and pelvic exam.  Past medical history,surgical history, problem list, medications, allergies, family history and social history were all reviewed and documented as reviewed in the EPIC chart.  ROS:  Performed with pertinent positives and negatives included in the history, assessment and plan.   Additional significant findings :  none   Exam: Kim Counsellor Vitals:   12/14/14 1106  BP: 120/74  Height: 5\' 3"  (1.6 m)  Weight: 153 lb (69.4 kg)   General appearance:  Normal affect, orientation and appearance. Skin: Grossly normal HEENT: Without gross lesions.  No cervical or supraclavicular adenopathy. Thyroid normal.  Lungs:  Clear without wheezing, rales or rhonchi Cardiac: RR, without RMG Abdominal:  Soft, nontender, without masses, guarding, rebound, organomegaly or hernia Breasts:  Examined lying and sitting without masses, retractions, discharge or axillary adenopathy.  Well-healed bilateral reduction scars Pelvic:  Ext/BUS/vagina with atrophic changes  Adnexa  Without masses or tenderness    Anus and perineum  Normal   Rectovaginal  Normal sphincter tone without palpated masses or tenderness.    Assessment/Plan:  64 y.o. [id number] female for breast and pelvic exam.   1. Postmenopausal/atrophic genital changes.  Status post TVH for bleeding 1991. Doing well without significant hot flushes, night sweats, vaginal dryness. Continue to monitor report any issues. 2. Mammography 09/2013. Patient has scheduled next week. SBE monthly reviewed. 3. DEXA 2012 normal. Plan repeat at five-year interval next year. 4. Pap smear 2015. No Pap smear done today. No history of significant abnormal Pap smears. 5. Colonoscopy 2011 with reported repeat interval 10 years. 6. Health maintenance. No routine lab work done as patient does this at her primary physician's office.  Follow up 1 year, sooner as needed.   Anastasio Auerbach MD, 11:32 AM 12/14/2014

## 2014-12-14 NOTE — Progress Notes (Signed)
Pre visit review using our clinic review tool, if applicable. No additional management support is needed unless otherwise documented below in the visit note. 

## 2014-12-14 NOTE — Assessment & Plan Note (Signed)
mole above R buttock Skin bx w/me

## 2014-12-14 NOTE — Progress Notes (Signed)
Subjective:  Patient ID: Ellen [name], female    DOB: [birth year]  Age: 64 y.o. MRN: [id number]  CC: No chief complaint on file.   HPI Ellen [name] presents for a well exam  Outpatient Prescriptions Prior to Visit  Medication Sig Dispense Refill  . BREO ELLIPTA 100-25 MCG/INH AEPB INHALE ONE PUFF INTO LUNGS DAILY 60 each 0  . Calcium Carbonate-Vitamin D (CALCIUM + D PO) Take by mouth.    . Cranberry 1000 MG CAPS Take 1 capsule by mouth daily.      . [place] encephalitis (JE-VAX) injection Inject 0.5 mLs into the skin once. Repeat in 4 weeks (Patient not taking: Reported on 12/14/2014) 2 each 0  . Lysine 1000 MG TABS Take 1,000 mg by mouth once.    . Melatonin 10 MG TABS Take 2 mg by mouth at bedtime and may repeat dose one time if needed.     . Multiple Vitamin (MULTIVITAMIN) tablet Take 1 tablet by mouth daily.    . NON FORMULARY Allerfex  1 by mouth once daily as needed    . Probiotic Product ([place]) Take by mouth daily.    [name] Specialty Vitamins Products (MAGNESIUM, AMINO ACID CHELATE,) 133 MG tablet Take 1 tablet by mouth 2 (two) times daily.    Cristino Martes Root 100 MG CAPS Take 1 capsule by mouth daily.      [name] zinc gluconate 50 MG tablet Take 50 mg by mouth daily.    [name] antiseptic oral rinse (BIOTENE) LIQD 15 mLs by Mouth Rinse route as needed for dry mouth.    . cefUROXime (CEFTIN) 500 MG tablet Take 1 tablet (500 mg total) by mouth 2 (two) times daily with a meal. 20 tablet 0  . citalopram (CELEXA) 20 MG tablet Take 1 tablet (20 mg total) by mouth daily. 30 tablet 11  . doxycycline (VIBRA-TABS) 100 MG tablet Take 1 tablet (100 mg total) by mouth 2 (two) times daily. 20 tablet 0  . erythromycin ophthalmic ointment Place 1 application into the left eye 4 (four) times daily. 3.5 g 0  . levothyroxine (SYNTHROID, LEVOTHROID) 75 MCG tablet Take 1 tablet (75 mcg total) by mouth daily. 90 tablet 3   No facility-administered medications prior to visit.    ROS Review  of Systems  Constitutional: Negative for chills, activity change, appetite change, fatigue and unexpected weight change.  HENT: Negative for congestion, mouth sores and sinus pressure.   Eyes: Negative for visual disturbance.  Respiratory: Negative for cough and chest tightness.   Gastrointestinal: Negative for nausea and abdominal pain.  Genitourinary: Negative for frequency, difficulty urinating and vaginal pain.  Musculoskeletal: Negative for back pain and gait problem.  Skin: Negative for pallor and rash.  Neurological: Negative for dizziness, tremors, weakness, numbness and headaches.  Psychiatric/Behavioral: Negative for suicidal ideas, confusion and sleep disturbance.    Objective:  BP 120/70 mmHg  Pulse 78  Ht 5\' 2"  (1.575 m)  Wt 154 lb (69.854 kg)  BMI 28.16 kg/m2  SpO2 96%  BP Readings from Last 3 Encounters:  12/14/14 120/74  12/14/14 120/70  09/28/14 132/82    Wt Readings from Last 3 Encounters:  12/14/14 153 lb (69.4 kg)  12/14/14 154 lb (69.854 kg)  09/28/14 154 lb 12.8 oz (70.217 kg)    Physical Exam  Constitutional: She appears well-developed. No distress.  HENT:  Head: Normocephalic.  Right Ear: External ear normal.  Left Ear: External ear normal.  Nose: Nose normal.  Mouth/Throat: Oropharynx is clear and moist.  Eyes: Conjunctivae are normal. Pupils are equal, round, and reactive to light. Right eye exhibits no discharge. Left eye exhibits no discharge.  Neck: Normal range of motion. Neck supple. No JVD present. No tracheal deviation present. No thyromegaly present.  Cardiovascular: Normal rate, regular rhythm and normal heart sounds.   Pulmonary/Chest: No stridor. No respiratory distress. She has no wheezes.  Abdominal: Soft. Bowel sounds are normal. She exhibits no distension and no mass. There is no tenderness. There is no rebound and no guarding.  Musculoskeletal: She exhibits no edema or tenderness.  Lymphadenopathy:    She has no cervical  adenopathy.  Neurological: She displays normal reflexes. No cranial nerve deficit. She exhibits normal muscle tone. Coordination normal.  Skin: No rash noted. No erythema.  Psychiatric: She has a normal mood and affect. Her behavior is normal. Judgment and thought content normal.  mole above R buttock  EKG NSR  Lab Results  Component Value Date   WBC 8.4 12/14/2014   HGB 14.3 12/14/2014   HCT 43.5 12/14/2014   PLT 236.0 12/14/2014   GLUCOSE 94 12/14/2014   CHOL 205* 12/14/2014   TRIG 160.0* 12/14/2014   HDL 55.60 12/14/2014   LDLDIRECT 142.6 07/09/2011   LDLCALC 118* 12/14/2014   ALT 19 12/14/2014   AST 24 12/14/2014   NA 139 12/14/2014   K 4.3 12/14/2014   CL 103 12/14/2014   CREATININE 0.72 12/14/2014   BUN 15 12/14/2014   CO2 29 12/14/2014   TSH 4.48 12/14/2014   HGBA1C 5.6 10/12/2006    No results found.  Assessment & Plan:   Diagnoses and all orders for this visit:  Well adult exam -     Basic metabolic panel; Future -     CBC with Differential/Platelet; Future -     Hepatic function panel; Future -     Lipid panel; Future -     TSH; Future -     Urinalysis; Future -     Hepatitis C antibody; Future -     EKG 12-Lead  Hypothyroidism due to acquired atrophy of thyroid -     Basic metabolic panel; Future -     CBC with Differential/Platelet; Future -     Hepatic function panel; Future -     Lipid panel; Future -     TSH; Future -     Urinalysis; Future -     Hepatitis C antibody; Future  Generalized anxiety disorder -     Basic metabolic panel; Future -     CBC with Differential/Platelet; Future -     Hepatic function panel; Future -     Lipid panel; Future -     TSH; Future -     Urinalysis; Future -     Hepatitis C antibody; Future  Neoplasm of uncertain behavior of skin  Other orders -     levothyroxine (SYNTHROID, LEVOTHROID) 75 MCG tablet; Take 1 tablet (75 mcg total) by mouth daily. -     citalopram (CELEXA) 20 MG tablet; Take 1 tablet  (20 mg total) by mouth daily.  I have discontinued Ms. Hornbeck's doxycycline and cefUROXime. I am also having her maintain her NON FORMULARY, Cranberry, Valerian Root, Probiotic Product ([place] PO), Melatonin, multivitamin, Calcium Carbonate-Vitamin D (CALCIUM + D PO), magnesium (amino acid chelate), Lysine, zinc gluconate, [place] encephalitis, BREO ELLIPTA, levothyroxine, and citalopram.  Meds ordered this encounter  Medications  . levothyroxine (SYNTHROID, LEVOTHROID) 75 MCG  tablet    Sig: Take 1 tablet (75 mcg total) by mouth daily.    Dispense:  90 tablet    Refill:  3  . citalopram (CELEXA) 20 MG tablet    Sig: Take 1 tablet (20 mg total) by mouth daily.    Dispense:  90 tablet    Refill:  2     Follow-up: Return in about 1 year (around 12/14/2015) for Wellness Exam.  Walker Kehr, MD

## 2014-12-14 NOTE — Patient Instructions (Signed)

## 2014-12-14 NOTE — Assessment & Plan Note (Signed)

## 2014-12-14 NOTE — Patient Instructions (Signed)
Preventive Care for Adults, Female A healthy lifestyle and preventive care can promote health and wellness. Preventive health guidelines for women include the following key practices.  A routine yearly physical is a good way to check with your health care provider about your health and preventive screening. It is a chance to share any concerns and updates on your health and to receive a thorough exam.  Visit your dentist for a routine exam and preventive care every 6 months. Brush your teeth twice a day and floss once a day. Good oral hygiene prevents tooth decay and gum disease.  The frequency of eye exams is based on your age, health, family medical history, use of contact lenses, and other factors. Follow your health care provider's recommendations for frequency of eye exams.  Eat a healthy diet. Foods like vegetables, fruits, whole grains, low-fat dairy products, and lean protein foods contain the nutrients you need without too many calories. Decrease your intake of foods high in solid fats, added sugars, and salt. Eat the right amount of calories for you.Get information about a proper diet from your health care provider, if necessary.  Regular physical exercise is one of the most important things you can do for your health. Most adults should get at least 150 minutes of moderate-intensity exercise (any activity that increases your heart rate and causes you to sweat) each week. In addition, most adults need muscle-strengthening exercises on 2 or more days a week.  Maintain a healthy weight. The body mass index (BMI) is a screening tool to identify possible weight problems. It provides an estimate of body fat based on height and weight. Your health care provider can find your BMI and can help you achieve or maintain a healthy weight.For adults 20 years and older:  A BMI below 18.5 is considered underweight.  A BMI of 18.5 to 24.9 is normal.  A BMI of 25 to 29.9 is considered overweight.  A  BMI of 30 and above is considered obese.  Maintain normal blood lipids and cholesterol levels by exercising and minimizing your intake of saturated fat. Eat a balanced diet with plenty of fruit and vegetables. Blood tests for lipids and cholesterol should begin at age 45 and be repeated every 5 years. If your lipid or cholesterol levels are high, you are over 50, or you are at high risk for heart disease, you may need your cholesterol levels checked more frequently.Ongoing high lipid and cholesterol levels should be treated with medicines if diet and exercise are not working.  If you smoke, find out from your health care provider how to quit. If you do not use tobacco, do not start.  Lung cancer screening is recommended for adults aged 45-80 years who are at high risk for developing lung cancer because of a history of smoking. A yearly low-dose CT scan of the lungs is recommended for people who have at least a 30-pack-year history of smoking and are a current smoker or have quit within the past 15 years. A pack year of smoking is smoking an average of 1 pack of cigarettes a day for 1 year (for example: 1 pack a day for 30 years or 2 packs a day for 15 years). Yearly screening should continue until the smoker has stopped smoking for at least 15 years. Yearly screening should be stopped for people who develop a health problem that would prevent them from having lung cancer treatment.  If you are pregnant, do not drink alcohol. If you are  breastfeeding, be very cautious about drinking alcohol. If you are not pregnant and choose to drink alcohol, do not have more than 1 drink per day. One drink is considered to be 12 ounces (355 mL) of beer, 5 ounces (148 mL) of wine, or 1.5 ounces (44 mL) of liquor.  Avoid use of street drugs. Do not share needles with anyone. Ask for help if you need support or instructions about stopping the use of drugs.  High blood pressure causes heart disease and increases the risk  of stroke. Your blood pressure should be checked at least every 1 to 2 years. Ongoing high blood pressure should be treated with medicines if weight loss and exercise do not work.  If you are 55-79 years old, ask your health care provider if you should take aspirin to prevent strokes.  Diabetes screening is done by taking a blood sample to check your blood glucose level after you have not eaten for a certain period of time (fasting). If you are not overweight and you do not have risk factors for diabetes, you should be screened once every 3 years starting at age 45. If you are overweight or obese and you are 40-70 years of age, you should be screened for diabetes every year as part of your cardiovascular risk assessment.  Breast cancer screening is essential preventive care for women. You should practice "breast self-awareness." This means understanding the normal appearance and feel of your breasts and may include breast self-examination. Any changes detected, no matter how small, should be reported to a health care provider. Women in their 20s and 30s should have a clinical breast exam (CBE) by a health care provider as part of a regular health exam every 1 to 3 years. After age 40, women should have a CBE every year. Starting at age 40, women should consider having a mammogram (breast X-ray test) every year. Women who have a family history of breast cancer should talk to their health care provider about genetic screening. Women at a high risk of breast cancer should talk to their health care providers about having an MRI and a mammogram every year.  Breast cancer gene (BRCA)-related cancer risk assessment is recommended for women who have family members with BRCA-related cancers. BRCA-related cancers include breast, ovarian, tubal, and peritoneal cancers. Having family members with these cancers may be associated with an increased risk for harmful changes (mutations) in the breast cancer genes BRCA1 and  BRCA2. Results of the assessment will determine the need for genetic counseling and BRCA1 and BRCA2 testing.  Your health care provider may recommend that you be screened regularly for cancer of the pelvic organs (ovaries, uterus, and vagina). This screening involves a pelvic examination, including checking for microscopic changes to the surface of your cervix (Pap test). You may be encouraged to have this screening done every 3 years, beginning at age 21.  For women ages 30-65, health care providers may recommend pelvic exams and Pap testing every 3 years, or they may recommend the Pap and pelvic exam, combined with testing for human papilloma virus (HPV), every 5 years. Some types of HPV increase your risk of cervical cancer. Testing for HPV may also be done on women of any age with unclear Pap test results.  Other health care providers may not recommend any screening for nonpregnant women who are considered low risk for pelvic cancer and who do not have symptoms. Ask your health care provider if a screening pelvic exam is right for   you.  If you have had past treatment for cervical cancer or a condition that could lead to cancer, you need Pap tests and screening for cancer for at least 20 years after your treatment. If Pap tests have been discontinued, your risk factors (such as having a new sexual partner) need to be reassessed to determine if screening should resume. Some women have medical problems that increase the chance of getting cervical cancer. In these cases, your health care provider may recommend more frequent screening and Pap tests.  Colorectal cancer can be detected and often prevented. Most routine colorectal cancer screening begins at the age of 50 years and continues through age 75 years. However, your health care provider may recommend screening at an earlier age if you have risk factors for colon cancer. On a yearly basis, your health care provider may provide home test kits to check  for hidden blood in the stool. Use of a small camera at the end of a tube, to directly examine the colon (sigmoidoscopy or colonoscopy), can detect the earliest forms of colorectal cancer. Talk to your health care provider about this at age 50, when routine screening begins. Direct exam of the colon should be repeated every 5-10 years through age 75 years, unless early forms of precancerous polyps or small growths are found.  People who are at an increased risk for hepatitis B should be screened for this virus. You are considered at high risk for hepatitis B if:  You were born in a country where hepatitis B occurs often. Talk with your health care provider about which countries are considered high risk.  Your parents were born in a high-risk country and you have not received a shot to protect against hepatitis B (hepatitis B vaccine).  You have HIV or AIDS.  You use needles to inject street drugs.  You live with, or have sex with, someone who has hepatitis B.  You get hemodialysis treatment.  You take certain medicines for conditions like cancer, organ transplantation, and autoimmune conditions.  Hepatitis C blood testing is recommended for all people born from 1945 through 1965 and any individual with known risks for hepatitis C.  Practice safe sex. Use condoms and avoid high-risk sexual practices to reduce the spread of sexually transmitted infections (STIs). STIs include gonorrhea, chlamydia, syphilis, trichomonas, herpes, HPV, and human immunodeficiency virus (HIV). Herpes, HIV, and HPV are viral illnesses that have no cure. They can result in disability, cancer, and death.  You should be screened for sexually transmitted illnesses (STIs) including gonorrhea and chlamydia if:  You are sexually active and are younger than 24 years.  You are older than 24 years and your health care provider tells you that you are at risk for this type of infection.  Your sexual activity has changed  since you were last screened and you are at an increased risk for chlamydia or gonorrhea. Ask your health care provider if you are at risk.  If you are at risk of being infected with HIV, it is recommended that you take a prescription medicine daily to prevent HIV infection. This is called preexposure prophylaxis (PrEP). You are considered at risk if:  You are sexually active and do not regularly use condoms or know the HIV status of your partner(s).  You take drugs by injection.  You are sexually active with a partner who has HIV.  Talk with your health care provider about whether you are at high risk of being infected with HIV. If   you choose to begin PrEP, you should first be tested for HIV. You should then be tested every 3 months for as long as you are taking PrEP.  Osteoporosis is a disease in which the bones lose minerals and strength with aging. This can result in serious bone fractures or breaks. The risk of osteoporosis can be identified using a bone density scan. Women ages 67 years and over and women at risk for fractures or osteoporosis should discuss screening with their health care providers. Ask your health care provider whether you should take a calcium supplement or vitamin D to reduce the rate of osteoporosis.  Menopause can be associated with physical symptoms and risks. Hormone replacement therapy is available to decrease symptoms and risks. You should talk to your health care provider about whether hormone replacement therapy is right for you.  Use sunscreen. Apply sunscreen liberally and repeatedly throughout the day. You should seek shade when your shadow is shorter than you. Protect yourself by wearing long sleeves, pants, a wide-brimmed hat, and sunglasses year round, whenever you are outdoors.  Once a month, do a whole body skin exam, using a mirror to look at the skin on your back. Tell your health care provider of new moles, moles that have irregular borders, moles that  are larger than a pencil eraser, or moles that have changed in shape or color.  Stay current with required vaccines (immunizations).  Influenza vaccine. All adults should be immunized every year.  Tetanus, diphtheria, and acellular pertussis (Td, Tdap) vaccine. Pregnant women should receive 1 dose of Tdap vaccine during each pregnancy. The dose should be obtained regardless of the length of time since the last dose. Immunization is preferred during the 27th-36th week of gestation. An adult who has not previously received Tdap or who does not know her vaccine status should receive 1 dose of Tdap. This initial dose should be followed by tetanus and diphtheria toxoids (Td) booster doses every 10 years. Adults with an unknown or incomplete history of completing a 3-dose immunization series with Td-containing vaccines should begin or complete a primary immunization series including a Tdap dose. Adults should receive a Td booster every 10 years.  Varicella vaccine. An adult without evidence of immunity to varicella should receive 2 doses or a second dose if she has previously received 1 dose. Pregnant females who do not have evidence of immunity should receive the first dose after pregnancy. This first dose should be obtained before leaving the health care facility. The second dose should be obtained 4-8 weeks after the first dose.  Human papillomavirus (HPV) vaccine. Females aged 13-26 years who have not received the vaccine previously should obtain the 3-dose series. The vaccine is not recommended for use in pregnant females. However, pregnancy testing is not needed before receiving a dose. If a female is found to be pregnant after receiving a dose, no treatment is needed. In that case, the remaining doses should be delayed until after the pregnancy. Immunization is recommended for any person with an immunocompromised condition through the age of 61 years if she did not get any or all doses earlier. During the  3-dose series, the second dose should be obtained 4-8 weeks after the first dose. The third dose should be obtained 24 weeks after the first dose and 16 weeks after the second dose.  Zoster vaccine. One dose is recommended for adults aged 30 years or older unless certain conditions are present.  Measles, mumps, and rubella (MMR) vaccine. Adults born  before 1957 generally are considered immune to measles and mumps. Adults born in 1957 or later should have 1 or more doses of MMR vaccine unless there is a contraindication to the vaccine or there is laboratory evidence of immunity to each of the three diseases. A routine second dose of MMR vaccine should be obtained at least 28 days after the first dose for students attending postsecondary schools, health care workers, or international travelers. People who received inactivated measles vaccine or an unknown type of measles vaccine during 1963-1967 should receive 2 doses of MMR vaccine. People who received inactivated mumps vaccine or an unknown type of mumps vaccine before 1979 and are at high risk for mumps infection should consider immunization with 2 doses of MMR vaccine. For females of childbearing age, rubella immunity should be determined. If there is no evidence of immunity, females who are not pregnant should be vaccinated. If there is no evidence of immunity, females who are pregnant should delay immunization until after pregnancy. Unvaccinated health care workers born before 1957 who lack laboratory evidence of measles, mumps, or rubella immunity or laboratory confirmation of disease should consider measles and mumps immunization with 2 doses of MMR vaccine or rubella immunization with 1 dose of MMR vaccine.  Pneumococcal 13-valent conjugate (PCV13) vaccine. When indicated, a person who is uncertain of his immunization history and has no record of immunization should receive the PCV13 vaccine. All adults 65 years of age and older should receive this  vaccine. An adult aged 19 years or older who has certain medical conditions and has not been previously immunized should receive 1 dose of PCV13 vaccine. This PCV13 should be followed with a dose of pneumococcal polysaccharide (PPSV23) vaccine. Adults who are at high risk for pneumococcal disease should obtain the PPSV23 vaccine at least 8 weeks after the dose of PCV13 vaccine. Adults older than 65 years of age who have normal immune system function should obtain the PPSV23 vaccine dose at least 1 year after the dose of PCV13 vaccine.  Pneumococcal polysaccharide (PPSV23) vaccine. When PCV13 is also indicated, PCV13 should be obtained first. All adults aged 65 years and older should be immunized. An adult younger than age 65 years who has certain medical conditions should be immunized. Any person who resides in a nursing home or long-term care facility should be immunized. An adult smoker should be immunized. People with an immunocompromised condition and certain other conditions should receive both PCV13 and PPSV23 vaccines. People with human immunodeficiency virus (HIV) infection should be immunized as soon as possible after diagnosis. Immunization during chemotherapy or radiation therapy should be avoided. Routine use of PPSV23 vaccine is not recommended for American Indians, [place] Natives, or people younger than 65 years unless there are medical conditions that require PPSV23 vaccine. When indicated, people who have unknown immunization and have no record of immunization should receive PPSV23 vaccine. One-time revaccination 5 years after the first dose of PPSV23 is recommended for people aged 19-64 years who have chronic kidney failure, nephrotic syndrome, asplenia, or immunocompromised conditions. People who received 1-2 doses of PPSV23 before age 65 years should receive another dose of PPSV23 vaccine at age 65 years or later if at least 5 years have passed since the previous dose. Doses of PPSV23 are not  needed for people immunized with PPSV23 at or after age 65 years.  Meningococcal vaccine. Adults with asplenia or persistent complement component deficiencies should receive 2 doses of quadrivalent meningococcal conjugate (MenACWY-D) vaccine. The doses should be obtained   at least 2 months apart. Microbiologists working with certain meningococcal bacteria, [place] recruits, people at risk during an outbreak, and people who travel to or live in countries with a high rate of meningitis should be immunized. A first-year college student up through age 34 years who is living in a residence hall should receive a dose if she did not receive a dose on or after her 16th birthday. Adults who have certain high-risk conditions should receive one or more doses of vaccine.  Hepatitis A vaccine. Adults who wish to be protected from this disease, have certain high-risk conditions, work with hepatitis A-infected animals, work in hepatitis A research labs, or travel to or work in countries with a high rate of hepatitis A should be immunized. Adults who were previously unvaccinated and who anticipate close contact with an international adoptee during the first 60 days after arrival in the [place] from a country with a high rate of hepatitis A should be immunized.  Hepatitis B vaccine. Adults who wish to be protected from this disease, have certain high-risk conditions, may be exposed to blood or other infectious body fluids, are household contacts or sex partners of hepatitis B positive people, are clients or workers in certain care facilities, or travel to or work in countries with a high rate of hepatitis B should be immunized.  Haemophilus influenzae type b (Hib) vaccine. A previously unvaccinated person with asplenia or sickle cell disease or having a scheduled splenectomy should receive 1 dose of Hib vaccine. Regardless of previous immunization, a recipient of a hematopoietic stem cell transplant should receive a  3-dose series 6-12 months after her successful transplant. Hib vaccine is not recommended for adults with HIV infection. Preventive Services / Frequency Ages 35 to 4 years  Blood pressure check.** / Every 3-5 years.  Lipid and cholesterol check.** / Every 5 years beginning at age 60.  Clinical breast exam.** / Every 3 years for women in their 71s and 10s.  BRCA-related cancer risk assessment.** / For women who have family members with a BRCA-related cancer (breast, ovarian, tubal, or peritoneal cancers).  Pap test.** / Every 2 years from ages 76 through 26. Every 3 years starting at age 61 through age 76 or 93 with a history of 3 consecutive normal Pap tests.  HPV screening.** / Every 3 years from ages 37 through ages 60 to 51 with a history of 3 consecutive normal Pap tests.  Hepatitis C blood test.** / For any individual with known risks for hepatitis C.  Skin self-exam. / Monthly.  Influenza vaccine. / Every year.  Tetanus, diphtheria, and acellular pertussis (Tdap, Td) vaccine.** / Consult your health care provider. Pregnant women should receive 1 dose of Tdap vaccine during each pregnancy. 1 dose of Td every 10 years.  Varicella vaccine.** / Consult your health care provider. Pregnant females who do not have evidence of immunity should receive the first dose after pregnancy.  HPV vaccine. / 3 doses over 6 months, if 93 and younger. The vaccine is not recommended for use in pregnant females. However, pregnancy testing is not needed before receiving a dose.  Measles, mumps, rubella (MMR) vaccine.** / You need at least 1 dose of MMR if you were born in 1957 or later. You may also need a 2nd dose. For females of childbearing age, rubella immunity should be determined. If there is no evidence of immunity, females who are not pregnant should be vaccinated. If there is no evidence of immunity, females who are  pregnant should delay immunization until after pregnancy.  Pneumococcal  13-valent conjugate (PCV13) vaccine.** / Consult your health care provider.  Pneumococcal polysaccharide (PPSV23) vaccine.** / 1 to 2 doses if you smoke cigarettes or if you have certain conditions.  Meningococcal vaccine.** / 1 dose if you are age 68 to 8 years and a Market researcher living in a residence hall, or have one of several medical conditions, you need to get vaccinated against meningococcal disease. You may also need additional booster doses.  Hepatitis A vaccine.** / Consult your health care provider.  Hepatitis B vaccine.** / Consult your health care provider.  Haemophilus influenzae type b (Hib) vaccine.** / Consult your health care provider. Ages 7 to 53 years  Blood pressure check.** / Every year.  Lipid and cholesterol check.** / Every 5 years beginning at age 25 years.  Lung cancer screening. / Every year if you are aged 11-80 years and have a 30-pack-year history of smoking and currently smoke or have quit within the past 15 years. Yearly screening is stopped once you have quit smoking for at least 15 years or develop a health problem that would prevent you from having lung cancer treatment.  Clinical breast exam.** / Every year after age 48 years.  BRCA-related cancer risk assessment.** / For women who have family members with a BRCA-related cancer (breast, ovarian, tubal, or peritoneal cancers).  Mammogram.** / Every year beginning at age 41 years and continuing for as long as you are in good health. Consult with your health care provider.  Pap test.** / Every 3 years starting at age 65 years through age 37 or 70 years with a history of 3 consecutive normal Pap tests.  HPV screening.** / Every 3 years from ages 72 years through ages 60 to 40 years with a history of 3 consecutive normal Pap tests.  Fecal occult blood test (FOBT) of stool. / Every year beginning at age 21 years and continuing until age 5 years. You may not need to do this test if you get  a colonoscopy every 10 years.  Flexible sigmoidoscopy or colonoscopy.** / Every 5 years for a flexible sigmoidoscopy or every 10 years for a colonoscopy beginning at age 35 years and continuing until age 48 years.  Hepatitis C blood test.** / For all people born from [birth year] through 1965 and any individual with known risks for hepatitis C.  Skin self-exam. / Monthly.  Influenza vaccine. / Every year.  Tetanus, diphtheria, and acellular pertussis (Tdap/Td) vaccine.** / Consult your health care provider. Pregnant women should receive 1 dose of Tdap vaccine during each pregnancy. 1 dose of Td every 10 years.  Varicella vaccine.** / Consult your health care provider. Pregnant females who do not have evidence of immunity should receive the first dose after pregnancy.  Zoster vaccine.** / 1 dose for adults aged 30 years or older.  Measles, mumps, rubella (MMR) vaccine.** / You need at least 1 dose of MMR if you were born in 1957 or later. You may also need a second dose. For females of childbearing age, rubella immunity should be determined. If there is no evidence of immunity, females who are not pregnant should be vaccinated. If there is no evidence of immunity, females who are pregnant should delay immunization until after pregnancy.  Pneumococcal 13-valent conjugate (PCV13) vaccine.** / Consult your health care provider.  Pneumococcal polysaccharide (PPSV23) vaccine.** / 1 to 2 doses if you smoke cigarettes or if you have certain conditions.  Meningococcal vaccine.** /  Consult your health care provider.  Hepatitis A vaccine.** / Consult your health care provider.  Hepatitis B vaccine.** / Consult your health care provider.  Haemophilus influenzae type b (Hib) vaccine.** / Consult your health care provider. Ages 64 years and over  Blood pressure check.** / Every year.  Lipid and cholesterol check.** / Every 5 years beginning at age 23 years.  Lung cancer screening. / Every year if you  are aged 16-80 years and have a 30-pack-year history of smoking and currently smoke or have quit within the past 15 years. Yearly screening is stopped once you have quit smoking for at least 15 years or develop a health problem that would prevent you from having lung cancer treatment.  Clinical breast exam.** / Every year after age 74 years.  BRCA-related cancer risk assessment.** / For women who have family members with a BRCA-related cancer (breast, ovarian, tubal, or peritoneal cancers).  Mammogram.** / Every year beginning at age 44 years and continuing for as long as you are in good health. Consult with your health care provider.  Pap test.** / Every 3 years starting at age 58 years through age 22 or 39 years with 3 consecutive normal Pap tests. Testing can be stopped between 65 and 70 years with 3 consecutive normal Pap tests and no abnormal Pap or HPV tests in the past 10 years.  HPV screening.** / Every 3 years from ages 64 years through ages 70 or 61 years with a history of 3 consecutive normal Pap tests. Testing can be stopped between 65 and 70 years with 3 consecutive normal Pap tests and no abnormal Pap or HPV tests in the past 10 years.  Fecal occult blood test (FOBT) of stool. / Every year beginning at age 40 years and continuing until age 27 years. You may not need to do this test if you get a colonoscopy every 10 years.  Flexible sigmoidoscopy or colonoscopy.** / Every 5 years for a flexible sigmoidoscopy or every 10 years for a colonoscopy beginning at age 7 years and continuing until age 32 years.  Hepatitis C blood test.** / For all people born from [birth year] through 1965 and any individual with known risks for hepatitis C.  Osteoporosis screening.** / A one-time screening for women ages 30 years and over and women at risk for fractures or osteoporosis.  Skin self-exam. / Monthly.  Influenza vaccine. / Every year.  Tetanus, diphtheria, and acellular pertussis (Tdap/Td)  vaccine.** / 1 dose of Td every 10 years.  Varicella vaccine.** / Consult your health care provider.  Zoster vaccine.** / 1 dose for adults aged 35 years or older.  Pneumococcal 13-valent conjugate (PCV13) vaccine.** / Consult your health care provider.  Pneumococcal polysaccharide (PPSV23) vaccine.** / 1 dose for all adults aged 46 years and older.  Meningococcal vaccine.** / Consult your health care provider.  Hepatitis A vaccine.** / Consult your health care provider.  Hepatitis B vaccine.** / Consult your health care provider.  Haemophilus influenzae type b (Hib) vaccine.** / Consult your health care provider. ** Family history and personal history of risk and conditions may change your health care provider's recommendations.   This information is not intended to replace advice given to you by your health care provider. Make sure you discuss any questions you have with your health care provider.   Document Released: 02/18/2001 Document Revised: 01/13/2014 Document Reviewed: 05/20/2010 Elsevier Interactive Patient Education [employer].

## 2014-12-14 NOTE — Assessment & Plan Note (Signed)
Labs Levothroid 

## 2014-12-14 NOTE — Assessment & Plan Note (Signed)
Chronic °Citalopram ° Potential benefits of a long term SSRI use as well as potential risks  and complications were explained to the patient and were aknowledged. °

## 2014-12-15 ENCOUNTER — Encounter: Payer: Self-pay | Admitting: Internal Medicine

## 2014-12-15 LAB — HEPATITIS C ANTIBODY: HCV Ab: NEGATIVE

## 2014-12-15 MED ORDER — SYNTHROID 88 MCG PO TABS: 88.0000 ug | ORAL_TABLET | Freq: Every day | ORAL | Status: DC

## 2014-12-16 ENCOUNTER — Encounter: Payer: Self-pay | Admitting: Internal Medicine

## 2014-12-18 ENCOUNTER — Encounter: Payer: Self-pay | Admitting: Internal Medicine

## 2014-12-18 ENCOUNTER — Telehealth: Payer: Self-pay | Admitting: Internal Medicine

## 2014-12-18 DIAGNOSIS — E039 Hypothyroidism, unspecified: Secondary | ICD-10-CM

## 2014-12-18 NOTE — Telephone Encounter (Signed)
Pt states that [employer] needs some documentation regarding why she needs the brand name of SYNTHROID 88 MCG tablet [id number] She also sent an email regarding her lab work and Plot wanted her to come back in to redo lab. Will he but a lab order in or will she need an appt? She is on antibiotic and is wanting to know if she should wait till antibiotics are gone before she does the lab work.

## 2014-12-19 ENCOUNTER — Other Ambulatory Visit: Payer: Self-pay | Admitting: Internal Medicine

## 2014-12-19 DIAGNOSIS — R898 Other abnormal findings in specimens from other organs, systems and tissues: Secondary | ICD-10-CM

## 2014-12-19 DIAGNOSIS — D721 Eosinophilia: Principal | ICD-10-CM

## 2014-12-20 ENCOUNTER — Telehealth: Payer: Self-pay

## 2014-12-20 NOTE — Telephone Encounter (Signed)
Patients insurance company sent over fax for PA. Form filled out, faxed and sent to scan.

## 2014-12-26 NOTE — Telephone Encounter (Signed)
PA needed for Synthroid has been initiated by Corrine. Phone documents the same from 12/20/2014  Lab order entered for retest TSH  My chart message to pt stating same.

## 2015-01-02 ENCOUNTER — Encounter: Payer: Self-pay | Admitting: Internal Medicine

## 2015-01-02 ENCOUNTER — Other Ambulatory Visit: Payer: Medicare Other

## 2015-01-02 ENCOUNTER — Encounter: Payer: Self-pay | Admitting: Gynecology

## 2015-01-02 DIAGNOSIS — D721 Eosinophilia: Principal | ICD-10-CM

## 2015-01-02 DIAGNOSIS — R898 Other abnormal findings in specimens from other organs, systems and tissues: Secondary | ICD-10-CM

## 2015-01-02 LAB — HM MAMMOGRAPHY

## 2015-01-03 LAB — OVA AND PARASITE EXAMINATION: OP: NONE SEEN

## 2015-01-04 ENCOUNTER — Encounter: Payer: Self-pay | Admitting: *Deleted

## 2015-01-05 ENCOUNTER — Other Ambulatory Visit (INDEPENDENT_AMBULATORY_CARE_PROVIDER_SITE_OTHER): Payer: Medicare Other

## 2015-01-05 DIAGNOSIS — E039 Hypothyroidism, unspecified: Secondary | ICD-10-CM | POA: Diagnosis not present

## 2015-01-05 LAB — TSH: TSH: 2.13 u[IU]/mL (ref 0.35–4.50)

## 2015-01-06 ENCOUNTER — Encounter: Payer: Self-pay | Admitting: Internal Medicine

## 2015-01-09 ENCOUNTER — Ambulatory Visit (INDEPENDENT_AMBULATORY_CARE_PROVIDER_SITE_OTHER): Payer: Medicare Other | Admitting: Internal Medicine

## 2015-01-09 ENCOUNTER — Encounter: Payer: Self-pay | Admitting: Internal Medicine

## 2015-01-09 VITALS — BP 110/80 | HR 61 | Wt 153.0 lb

## 2015-01-09 DIAGNOSIS — E034 Atrophy of thyroid (acquired): Secondary | ICD-10-CM

## 2015-01-09 DIAGNOSIS — D485 Neoplasm of uncertain behavior of skin: Secondary | ICD-10-CM | POA: Diagnosis not present

## 2015-01-09 DIAGNOSIS — E038 Other specified hypothyroidism: Secondary | ICD-10-CM | POA: Diagnosis not present

## 2015-01-09 DIAGNOSIS — D225 Melanocytic nevi of trunk: Secondary | ICD-10-CM | POA: Diagnosis not present

## 2015-01-09 MED ORDER — LEVOTHYROXINE SODIUM 112 MCG PO TABS: 112.0000 ug | ORAL_TABLET | Freq: Every day | ORAL | Status: DC

## 2015-01-09 NOTE — Progress Notes (Signed)
Subjective:  Patient ID: Ellen [name], female    DOB: 10/19/1950  Age: 65 y.o. MRN: [id number]  CC: No chief complaint on file.   HPI Ellen [name] presents for abn mole above R buttock. F/U HYPOTHYROIDISM.  Outpatient Prescriptions Prior to Visit  Medication Sig Dispense Refill  . BREO ELLIPTA 100-25 MCG/INH AEPB INHALE ONE PUFF INTO LUNGS DAILY 60 each 0  . Calcium Carbonate-Vitamin D (CALCIUM + D PO) Take by mouth.    . citalopram (CELEXA) 20 MG tablet Take 1 tablet (20 mg total) by mouth daily. 90 tablet 2  . Cranberry 1000 MG CAPS Take 1 capsule by mouth daily.      . [place] encephalitis (JE-VAX) injection Inject 0.5 mLs into the skin once. Repeat in 4 weeks 2 each 0  . Lysine 1000 MG TABS Take 1,000 mg by mouth once.    . Melatonin 10 MG TABS Take 2 mg by mouth at bedtime and may repeat dose one time if needed.     . Multiple Vitamin (MULTIVITAMIN) tablet Take 1 tablet by mouth daily.    . NON FORMULARY Allerfex  1 by mouth once daily as needed    . Probiotic Product ([place]) Take by mouth daily.    [name] Specialty Vitamins Products (MAGNESIUM, AMINO ACID CHELATE,) 133 MG tablet Take 1 tablet by mouth 2 (two) times daily.    Ellen [name] Root 100 MG CAPS Take 1 capsule by mouth daily.      [name] zinc gluconate 50 MG tablet Take 50 mg by mouth daily.    [name] SYNTHROID 88 MCG tablet Take 1 tablet (88 mcg total) by mouth daily before breakfast. 30 tablet 11   No facility-administered medications prior to visit.    ROS Review of Systems  Constitutional: Negative for fatigue.  Cardiovascular: Negative for palpitations.  Neurological: Negative for tremors and weakness.    Objective:  BP 110/80 mmHg  Pulse 61  Wt 153 lb (69.4 kg)  SpO2 98%  BP Readings from Last 3 Encounters:  01/09/15 110/80  12/14/14 120/74  12/14/14 120/70    Wt Readings from Last 3 Encounters:  01/09/15 153 lb (69.4 kg)  12/14/14 153 lb (69.4 kg)  12/14/14 154 lb (69.854 kg)     Physical Exam  Constitutional: She appears well-nourished. No distress.  Musculoskeletal: She exhibits no edema.  Skin: No rash noted. She is not diaphoretic.    Procedure Note :     Procedure :  Skin biopsy   Indication:  Changing mole (s ),  Suspicious lesion(s)   Risks including unsuccessful procedure , bleeding, infection, bruising, scar, a need for another complete procedure and others were explained to the patient in detail as well as the benefits. Informed consent was obtained and signed.   The patient was placed in a decubitus position.  Lesion #1 on LS spine over R buttock   measuring  1x2   mm   Skin over lesion #1  was prepped with Betadine and alcohol  and anesthetized with 1 cc of 2% lidocaine and epinephrine, using a 25-gauge 1 inch needle.  Shave biopsy with a sterile Dermablade was carried out in the usual fashion. Hyfrecator was used to destroy the rest of the lesion potentially left behind and for hemostasis. Band-Aid was applied with antibiotic ointment.   Tolerated well. Complications none.      Postprocedure instructions :    A Band-Aid should be  changed twice daily. You can  take a shower tomorrow.  Keep the wounds clean. You can wash them with liquid soap and water. Pat dry with gauze or a Kleenex tissue  Before applying antibiotic ointment and a Band-Aid.   You need to report immediately  if fever, chills or any signs of infection develop.    The biopsy results should be available in 1 -2 weeks.   Lab Results  Component Value Date   WBC 8.4 12/14/2014   HGB 14.3 12/14/2014   HCT 43.5 12/14/2014   PLT 236.0 12/14/2014   GLUCOSE 94 12/14/2014   CHOL 205* 12/14/2014   TRIG 160.0* 12/14/2014   HDL 55.60 12/14/2014   LDLDIRECT 142.6 07/09/2011   LDLCALC 118* 12/14/2014   ALT 19 12/14/2014   AST 24 12/14/2014   NA 139 12/14/2014   K 4.3 12/14/2014   CL 103 12/14/2014   CREATININE 0.72 12/14/2014   BUN 15 12/14/2014   CO2 29 12/14/2014   TSH  2.13 01/05/2015   HGBA1C 5.6 10/12/2006    No results found.  Assessment & Plan:   Diagnoses and all orders for this visit:  Neoplasm of uncertain behavior of skin -     Dermatology pathology; Standing -     Dermatology pathology  Hypothyroidism due to acquired atrophy of thyroid  Other orders -     levothyroxine (SYNTHROID, LEVOTHROID) 112 MCG tablet; Take 1 tablet (112 mcg total) by mouth daily.   I have discontinued [name]'s SYNTHROID. I am also having her start on levothyroxine. Additionally, I am having her maintain her NON FORMULARY, Cranberry, Valerian Root, Probiotic Product ([place] PO), Melatonin, multivitamin, Calcium Carbonate-Vitamin D (CALCIUM + D PO), magnesium (amino acid chelate), Lysine, zinc gluconate, [place] encephalitis, BREO ELLIPTA, and citalopram.  Meds ordered this encounter  Medications  . levothyroxine (SYNTHROID, LEVOTHROID) 112 MCG tablet    Sig: Take 1 tablet (112 mcg total) by mouth daily.    Dispense:  90 tablet    Refill:  3     Follow-up: No Follow-up on file.  Walker Kehr, MD

## 2015-01-09 NOTE — Progress Notes (Signed)
Pre visit review using our clinic review tool, if applicable. No additional management support is needed unless otherwise documented below in the visit note. 

## 2015-01-09 NOTE — Assessment & Plan Note (Signed)
mole above R buttock Skin bx

## 2015-01-09 NOTE — Assessment & Plan Note (Signed)
LEVOTHROID 112 MCG/D

## 2015-01-10 ENCOUNTER — Encounter: Payer: Self-pay | Admitting: Internal Medicine

## 2015-01-12 DIAGNOSIS — L738 Other specified follicular disorders: Secondary | ICD-10-CM | POA: Diagnosis not present

## 2015-01-14 ENCOUNTER — Encounter: Payer: Self-pay | Admitting: Internal Medicine

## 2015-01-19 ENCOUNTER — Encounter: Payer: Self-pay | Admitting: Gynecology

## 2015-01-22 ENCOUNTER — Encounter: Payer: Self-pay | Admitting: Internal Medicine

## 2015-03-15 ENCOUNTER — Ambulatory Visit: Payer: Self-pay | Admitting: Internal Medicine

## 2015-03-27 ENCOUNTER — Encounter: Payer: Self-pay | Admitting: Internal Medicine

## 2015-03-27 ENCOUNTER — Ambulatory Visit (INDEPENDENT_AMBULATORY_CARE_PROVIDER_SITE_OTHER): Payer: Medicare Other | Admitting: Internal Medicine

## 2015-03-27 VITALS — BP 130/88 | HR 73 | Wt 158.0 lb

## 2015-03-27 DIAGNOSIS — D721 Eosinophilia, unspecified: Secondary | ICD-10-CM | POA: Insufficient documentation

## 2015-03-27 DIAGNOSIS — E034 Atrophy of thyroid (acquired): Secondary | ICD-10-CM

## 2015-03-27 DIAGNOSIS — R21 Rash and other nonspecific skin eruption: Secondary | ICD-10-CM | POA: Diagnosis not present

## 2015-03-27 DIAGNOSIS — E038 Other specified hypothyroidism: Secondary | ICD-10-CM | POA: Diagnosis not present

## 2015-03-27 NOTE — Assessment & Plan Note (Signed)
rash with sweating (not new) -  Pt is seeing a dermatologist ?related to [place]

## 2015-03-27 NOTE — Progress Notes (Signed)
Subjective:  Patient ID: Ellen [name], female    DOB: [birth year]  Age: 65 y.o. MRN: [id number]  CC: No chief complaint on file.   HPI Ellen [name] presents for abn CBC and hypothyroidism f/u. C/o rash with sweating (not new) - seeing a dermatologist. No pets at home  Outpatient Prescriptions Prior to Visit  Medication Sig Dispense Refill  . BREO ELLIPTA 100-25 MCG/INH AEPB INHALE ONE PUFF INTO LUNGS DAILY 60 each 0  . Calcium Carbonate-Vitamin D (CALCIUM + D PO) Take by mouth.    . citalopram (CELEXA) 20 MG tablet Take 1 tablet (20 mg total) by mouth daily. 90 tablet 2  . Cranberry 1000 MG CAPS Take 1 capsule by mouth daily.      . [place] encephalitis (JE-VAX) injection Inject 0.5 mLs into the skin once. Repeat in 4 weeks 2 each 0  . levothyroxine (SYNTHROID, LEVOTHROID) 112 MCG tablet Take 1 tablet (112 mcg total) by mouth daily. 90 tablet 3  . Lysine 1000 MG TABS Take 1,000 mg by mouth once.    . Melatonin 10 MG TABS Take 2 mg by mouth at bedtime and may repeat dose one time if needed.     . Multiple Vitamin (MULTIVITAMIN) tablet Take 1 tablet by mouth daily.    . NON FORMULARY Allerfex  1 by mouth once daily as needed    . Probiotic Product ([place]) Take by mouth daily.    [name] Specialty Vitamins Products (MAGNESIUM, AMINO ACID CHELATE,) 133 MG tablet Take 1 tablet by mouth 2 (two) times daily.    Cristino Martes Root 100 MG CAPS Take 1 capsule by mouth daily.      [name] zinc gluconate 50 MG tablet Take 50 mg by mouth daily.     No facility-administered medications prior to visit.    ROS Review of Systems  Constitutional: Positive for diaphoresis. Negative for fever and fatigue.  HENT: Negative for nosebleeds, rhinorrhea and trouble swallowing.   Gastrointestinal: Negative for blood in stool.  Musculoskeletal: Negative for arthralgias and gait problem.  Skin: Positive for rash. Negative for pallor.  Neurological: Negative for light-headedness.    Psychiatric/Behavioral: The patient is not hyperactive.     Objective:  BP 130/88 mmHg  Pulse 73  Wt 158 lb (71.668 kg)  SpO2 99%  BP Readings from Last 3 Encounters:  03/27/15 130/88  01/09/15 110/80  12/14/14 120/74    Wt Readings from Last 3 Encounters:  03/27/15 158 lb (71.668 kg)  01/09/15 153 lb (69.4 kg)  12/14/14 153 lb (69.4 kg)    Physical Exam  Constitutional: She appears well-developed. No distress.  HENT:  Head: Normocephalic.  Right Ear: External ear normal.  Left Ear: External ear normal.  Nose: Nose normal.  Mouth/Throat: Oropharynx is clear and moist.  Eyes: Conjunctivae are normal. Pupils are equal, round, and reactive to light. Right eye exhibits no discharge. Left eye exhibits no discharge.  Neck: Normal range of motion. Neck supple. No JVD present. No tracheal deviation present. No thyromegaly present.  Cardiovascular: Normal rate, regular rhythm and normal heart sounds.   Pulmonary/Chest: No stridor. No respiratory distress. She has no wheezes.  Abdominal: Soft. Bowel sounds are normal. She exhibits no distension and no mass. There is no tenderness. There is no rebound and no guarding.  Musculoskeletal: She exhibits no edema or tenderness.  Lymphadenopathy:    She has no cervical adenopathy.  Neurological: She displays normal reflexes. No cranial nerve deficit. She exhibits  normal muscle tone. Coordination normal.  Skin: No rash noted. No erythema.  Psychiatric: She has a normal mood and affect. Her behavior is normal. Judgment and thought content normal.    Lab Results  Component Value Date   WBC 8.4 12/14/2014   HGB 14.3 12/14/2014   HCT 43.5 12/14/2014   PLT 236.0 12/14/2014   GLUCOSE 94 12/14/2014   CHOL 205* 12/14/2014   TRIG 160.0* 12/14/2014   HDL 55.60 12/14/2014   LDLDIRECT 142.6 07/09/2011   LDLCALC 118* 12/14/2014   ALT 19 12/14/2014   AST 24 12/14/2014   NA 139 12/14/2014   K 4.3 12/14/2014   CL 103 12/14/2014   CREATININE  0.72 12/14/2014   BUN 15 12/14/2014   CO2 29 12/14/2014   TSH 2.13 01/05/2015   HGBA1C 5.6 10/12/2006    No results found.  Assessment & Plan:   Diagnoses and all orders for this visit:  Eosinophilia -     CBC with Differential/Platelet; Future  Hypothyroidism due to acquired atrophy of thyroid -     Basic metabolic panel; Future -     TSH; Future -     T3, free; Future  Rash and other nonspecific skin eruption   I am having [name] maintain her NON FORMULARY, Cranberry, Valerian Root, Probiotic Product ([place] PO), Melatonin, multivitamin, Calcium Carbonate-Vitamin D (CALCIUM + D PO), magnesium (amino acid chelate), Lysine, zinc gluconate, [place] encephalitis, BREO ELLIPTA, citalopram, and levothyroxine.  No orders of the defined types were placed in this encounter.     Follow-up: Return in about 4 months (around 07/27/2015) for a follow-up visit.  Walker Kehr, MD

## 2015-03-27 NOTE — Assessment & Plan Note (Signed)
Chronic ?etiology

## 2015-03-27 NOTE — Assessment & Plan Note (Signed)
Labs On Levothroid 

## 2015-03-27 NOTE — Progress Notes (Signed)
Pre visit review using our clinic review tool, if applicable. No additional management support is needed unless otherwise documented below in the visit note. 

## 2015-03-28 ENCOUNTER — Other Ambulatory Visit (INDEPENDENT_AMBULATORY_CARE_PROVIDER_SITE_OTHER): Payer: Medicare Other

## 2015-03-28 DIAGNOSIS — D721 Eosinophilia, unspecified: Secondary | ICD-10-CM

## 2015-03-28 DIAGNOSIS — E038 Other specified hypothyroidism: Secondary | ICD-10-CM

## 2015-03-28 DIAGNOSIS — E034 Atrophy of thyroid (acquired): Secondary | ICD-10-CM | POA: Diagnosis not present

## 2015-03-28 LAB — BASIC METABOLIC PANEL
BUN: 16 mg/dL (ref 6–23)
CO2: 30 mEq/L (ref 19–32)
Calcium: 9.1 mg/dL (ref 8.4–10.5)
Chloride: 106 mEq/L (ref 96–112)
Creatinine, Ser: 0.76 mg/dL (ref 0.40–1.20)
GFR: 80.8 mL/min (ref >60.00)
Glucose, Bld: 89 mg/dL (ref 70–99)
Potassium: 4.3 mEq/L (ref 3.5–5.1)
Sodium: 141 mEq/L (ref 135–145)

## 2015-03-28 LAB — T3, FREE: T3, Free: 3.3 pg/mL (ref 2.3–4.2)

## 2015-03-28 LAB — CBC WITH DIFFERENTIAL/PLATELET
Basophils Absolute: 0.1 10*3/uL (ref 0.0–0.1)
Basophils Relative: 0.8 % (ref 0.0–3.0)
Eosinophils Absolute: 0.8 10*3/uL — ABNORMAL HIGH (ref 0.0–0.7)
Eosinophils Relative: 10.8 % — ABNORMAL HIGH (ref 0.0–5.0)
HCT: 38.5 % (ref 36.0–46.0)
Hemoglobin: 12.8 g/dL (ref 12.0–15.0)
Lymphocytes Relative: 34.4 % (ref 12.0–46.0)
Lymphs Abs: 2.5 10*3/uL (ref 0.7–4.0)
MCHC: 33.1 g/dL (ref 30.0–36.0)
MCV: 87.6 fl (ref 78.0–100.0)
Monocytes Absolute: 0.7 10*3/uL (ref 0.1–1.0)
Monocytes Relative: 10.2 % (ref 3.0–12.0)
Neutro Abs: 3.2 10*3/uL (ref 1.4–7.7)
Neutrophils Relative %: 43.8 % (ref 43.0–77.0)
Platelets: 205 10*3/uL (ref 150.0–400.0)
RBC: 4.4 Mil/uL (ref 3.87–5.11)
RDW: 14.3 % (ref 11.5–15.5)
WBC: 7.3 10*3/uL (ref 4.0–10.5)

## 2015-03-28 LAB — TSH: TSH: 0.9 u[IU]/mL (ref 0.35–4.50)

## 2015-03-29 ENCOUNTER — Other Ambulatory Visit: Payer: Self-pay | Admitting: Internal Medicine

## 2015-03-29 DIAGNOSIS — D721 Eosinophilia, unspecified: Secondary | ICD-10-CM

## 2015-04-05 ENCOUNTER — Encounter: Payer: Self-pay | Admitting: Internal Medicine

## 2015-04-17 ENCOUNTER — Encounter: Payer: Self-pay | Admitting: Internal Medicine

## 2015-04-25 ENCOUNTER — Ambulatory Visit (HOSPITAL_BASED_OUTPATIENT_CLINIC_OR_DEPARTMENT_OTHER): Payer: Medicare Other | Admitting: Hematology & Oncology

## 2015-04-25 ENCOUNTER — Encounter: Payer: Self-pay | Admitting: Hematology & Oncology

## 2015-04-25 ENCOUNTER — Other Ambulatory Visit (HOSPITAL_BASED_OUTPATIENT_CLINIC_OR_DEPARTMENT_OTHER): Payer: Medicare Other

## 2015-04-25 ENCOUNTER — Ambulatory Visit: Payer: Medicare Other

## 2015-04-25 ENCOUNTER — Other Ambulatory Visit: Payer: Self-pay | Admitting: Internal Medicine

## 2015-04-25 VITALS — BP 131/75 | HR 59 | Temp 98.2°F | Resp 18 | Ht 63.0 in | Wt 158.0 lb

## 2015-04-25 DIAGNOSIS — D721 Eosinophilia, unspecified: Secondary | ICD-10-CM

## 2015-04-25 LAB — CBC WITH DIFFERENTIAL (CANCER CENTER ONLY)
BASO#: 0.1 10*3/uL (ref 0.0–0.2)
BASO%: 0.7 % (ref 0.0–2.0)
EOS%: 11.4 % — ABNORMAL HIGH (ref 0.0–7.0)
Eosinophils Absolute: 0.8 10*3/uL — ABNORMAL HIGH (ref 0.0–0.5)
HCT: 40.7 % (ref 34.8–46.6)
HGB: 13.6 g/dL (ref 11.6–15.9)
LYMPH#: 2.2 10*3/uL (ref 0.9–3.3)
LYMPH%: 29.4 % (ref 14.0–48.0)
MCH: 29.5 pg (ref 26.0–34.0)
MCHC: 33.4 g/dL (ref 32.0–36.0)
MCV: 88 fL (ref 81–101)
MONO#: 0.6 10*3/uL (ref 0.1–0.9)
MONO%: 8.7 % (ref 0.0–13.0)
NEUT#: 3.7 10*3/uL (ref 1.5–6.5)
NEUT%: 49.8 % (ref 39.6–80.0)
Platelets: 208 10*3/uL (ref 145–400)
RBC: 4.61 10*6/uL (ref 3.70–5.32)
RDW: 13.3 % (ref 11.1–15.7)
WBC: 7.4 10*3/uL (ref 3.9–10.0)

## 2015-04-25 LAB — CHCC SATELLITE - SMEAR

## 2015-04-25 NOTE — Progress Notes (Signed)
Referral MD  Reason for Referral: Mild eosinophilia-reactive   Chief Complaint  Patient presents with  . Other    New Patient  : My eosinophils have been elevated.  HPI: Ellen [name] is a very nice 66 year old white female. She does have some medical issues. She does have allergies. She is on quite a few medications.  She has had some mild elevation of eosinophils. Going back to 2010, she had a percent eosinophils. Her white cell count has never been elevated.  In 2014, her eosinophils were 7.7%. Her white cell count was 5.6.  In 2015, her eosinophils were 9.9% with a total white cell count of 6.2.  She and her husband travel. They were in [place] last November. She apparently got sick over there. When she got back, her eosinophils were 17.5%. Her white cell count was 8.4. At that time, she has some stool cultures done for parasites which were negative.  Her most recent CBC was in March. Her white cell count 7.3. Hemoglobin 12.8. Platelet count 205,000. In her is [place]'s were 10.8%.  Her family doctor was worried about the eosinophilia and kindly refer to the was revealed for [place] for an evaluation. Past she is on quite a few medications. She does have allergies. She does have asthma. I would have to think that the eosinophilia is from these allergies. She is on quite a few supplements.  She has no problems with itching. She's had no rashes. She's had no fever. There's been no weight loss or weight gain.  She has had no joint problems. As far she knows, there is no evidence of any collagen vascular disease.  She does not smoke. She enjoys an occasional alcoholic beverage.  Currently, her performance status is ECOG 0. She and her husband are getting ready for multiple bike rides to help support multiple sclerosis.     Past Medical History  Diagnosis Date  . INSOMNIA-SLEEP DISORDER-UNSPEC   . ALLERGIC RHINITIS   . GERD   . ANXIETY   . Thyroid disease   :  Past  Surgical History  Procedure Laterality Date  . Appendectomy    . Facelift      s/p  . Upper nl tsh w/sx    . Cesarean section      twice  . Tonsillectomy  1974  . Breast surgery      REDUCTION MAMMOPLASTY  . Ankle fracture surgery  2012    R  . Vaginal hysterectomy  1991    TVH, irregular bleeding  . Cosmetic surgery    :   Current outpatient prescriptions:  .  aspirin 81 MG tablet, Take 81 mg by mouth daily., Disp: , Rfl:  .  Astaxanthin 5 MG CAPS, Take by mouth., Disp: , Rfl:  .  Biotin 5000 MCG CAPS, Take by mouth., Disp: , Rfl:  .  BREO ELLIPTA 100-25 MCG/INH AEPB, INHALE ONE PUFF INTO LUNGS DAILY, Disp: 60 each, Rfl: 0 .  Calcium Carbonate-Vitamin D (CALCIUM + D PO), Take by mouth., Disp: , Rfl:  .  citalopram (CELEXA) 20 MG tablet, Take 1 tablet (20 mg total) by mouth daily., Disp: 90 tablet, Rfl: 2 .  Clindamycin Phosphate foam, Apply topically 2 (two) times daily., Disp: , Rfl:  .  clobetasol (OLUX) 0.05 % topical foam, Apply topically 2 (two) times daily., Disp: , Rfl:  .  Cranberry 1000 MG CAPS, Take 1 capsule by mouth daily.  , Disp: , Rfl:  .  dextromethorphan-guaiFENesin ([place] DM)  30-600 MG 12hr tablet, Take 1 tablet by mouth 2 (two) times daily., Disp: , Rfl:  .  diphenhydrAMINE (SOMINEX) 25 MG tablet, Take 25 mg by mouth at bedtime as needed for sleep., Disp: , Rfl:  .  docusate sodium (COLACE) 100 MG capsule, Take 100 mg by mouth 2 (two) times daily., Disp: , Rfl:  .  [place] encephalitis (JE-VAX) injection, Inject 0.5 mLs into the skin once. Repeat in 4 weeks, Disp: 2 each, Rfl: 0 .  levothyroxine (SYNTHROID, LEVOTHROID) 112 MCG tablet, Take 1 tablet (112 mcg total) by mouth daily., Disp: 90 tablet, Rfl: 3 .  Lysine 1000 MG TABS, Take 1,000 mg by mouth once., Disp: , Rfl:  .  Multiple Vitamin (MULTIVITAMIN) tablet, Take 1 tablet by mouth daily., Disp: , Rfl:  .  NON FORMULARY, Allerfex  1 by mouth once daily as needed, Disp: , Rfl:  .  Probiotic Product  ([place]), Take by mouth daily., Disp: , Rfl:  .  promethazine-codeine (PHENERGAN WITH CODEINE) 6.25-10 MG/5ML syrup, Take by mouth every 6 (six) hours as needed for cough., Disp: , Rfl:  .  traZODone (DESYREL) 25 mg TABS tablet, Take 25 mg by mouth at bedtime., Disp: , Rfl:  .  Valerian Root 100 MG CAPS, Take 1 capsule by mouth daily.  , Disp: , Rfl:  .  zinc gluconate 50 MG tablet, Take 50 mg by mouth daily., Disp: , Rfl:  .  Melatonin 10 MG TABS, Take 2 mg by mouth at bedtime and may repeat dose one time if needed. , Disp: , Rfl: :  :  Allergies  Allergen Reactions  . Belviq [Lorcaserin Hcl]     palpitations  . Demerol [Meperidine]     palpitations  :  Family History  Problem Relation Age of Onset  . Heart disease Father     CAD/CABG in his 56's  . Hypertension Mother   . Heart failure Mother   :  Social History   Social History  . Marital Status: Married    Spouse Name: N/A  . Number of Children: 2  . Years of Education: N/A   Occupational History  . Retired    Social History Main Topics  . Smoking status: Former Research scientist (life sciences)  . Smokeless tobacco: Not on file  . Alcohol Use: 0.0 oz/week    0 Standard drinks or equivalent per week     Comment: Rare  . Drug Use: No  . Sexual Activity: Yes    Birth Control/ Protection: Post-menopausal, Surgical     Comment: HYST-1st intercourse 65 yo-Fewer than 5 partners   Other Topics Concern  . Not on file   Social History Narrative   Regular exercise   2 biol children-1 disabled with schizophrenia & 1 stepchild  :  Pertinent items are noted in HPI.  Exam: '@IPVITALS' @ Well-developed and well-nourished white female in no obvious distress. Vital signs show a temperature of 98.2. Pulse 59. Blood pressure 131/75. Weight is 158 pounds and neck exam shows no ocular or oral lesions. She has no palpable cervical or supraclavicular lymph nodes. Lungs are clear. Cardiac exam regular rate and rhythm with no murmurs, rubs  or bruits. Abdomen is soft. She has good bowel sounds. There is no fluid wave. There is no palpable liver or spleen tip. Back exam shows no tenderness over the spine, ribs or hips. Extremities shows no clubbing, cyanosis or edema. Skin exam shows no rashes, ecchymoses or petechia. I do not see any  evidence of dermatographism.   Neurological exam shows no focal neurological deficits.    Recent Labs  04/25/15 1522  WBC 7.4  HGB 13.6  HCT 40.7  PLT 208   No results for input(s): NA, K, CL, CO2, GLUCOSE, BUN, CREATININE, CALCIUM in the last 72 hours.  Blood smear review:  Normochromic and normocytic population of red blood cells. She has no nucleated red blood cells. There are no teardrop cells. She has no rouleau formation. There are no schistocytes or spherocytes. White cells been normal in morphology maturation. There may be slight increase in eosinophils. They appear to be normal in appearance. I do not see any hypersegmented polys. I do not see any immature myeloid or lymphoid forms. Platelets are adequate number size.  Pathology: None     Assessment and Plan:  Ellen [name] is a very charming 65 year old white female. She has mild eosinophilia. She does not meet criteria for hypereosinophilic syndrome.  I have to believe that the eosinophilia is reactive. She does have the asthma. I suppose this could be the source of the eosinophilia   She's on a ton of medications. She is on a lot of supplements. I suppose that she may have an "allergy."  I do not see that we have do a bone marrow biopsy on her. I don't see that we have to do any scans looking for organomegaly. I don't think she needs an echocardiogram.  I will like to see her back in 4 months. If the eosinophilia is similar, at the we can probably let her go from the practice.  Vital see that we have to do a lot of genetic testing. I know for significant prolonged eosinophilia, we often genetic test for myeloproliferative disorders.  These tests include JAK2, BCR/ABL, and FIP1L1-PDGFRA.  I spent about an hour with she and her husband. They are incredibly fascinating people. He was a former bassoonist. It was fun talking to about this as I am a former oboist. We are members of the [employer].

## 2015-04-26 LAB — LACTATE DEHYDROGENASE: LDH: 182 U/L (ref 125–245)

## 2015-04-26 LAB — RHEUMATOID FACTOR: RA Latex Turbid.: <10 IU/mL (ref 0.0–13.9)

## 2015-04-26 LAB — ANTINUCLEAR ANTIBODIES, IFA: ANA Titer 1: NEGATIVE

## 2015-04-27 ENCOUNTER — Ambulatory Visit: Payer: Medicare Other | Admitting: Family

## 2015-04-30 ENCOUNTER — Ambulatory Visit: Payer: Medicare Other | Admitting: Hematology & Oncology

## 2015-04-30 ENCOUNTER — Other Ambulatory Visit: Payer: Medicare Other

## 2015-04-30 ENCOUNTER — Ambulatory Visit: Payer: Medicare Other

## 2015-05-09 ENCOUNTER — Ambulatory Visit: Payer: Medicare Other | Admitting: Hematology & Oncology

## 2015-05-09 ENCOUNTER — Other Ambulatory Visit: Payer: Medicare Other

## 2015-05-09 ENCOUNTER — Ambulatory Visit: Payer: Medicare Other

## 2015-06-05 ENCOUNTER — Encounter: Payer: Self-pay | Admitting: Internal Medicine

## 2015-06-07 ENCOUNTER — Encounter: Payer: Self-pay | Admitting: Internal Medicine

## 2015-06-07 ENCOUNTER — Ambulatory Visit (INDEPENDENT_AMBULATORY_CARE_PROVIDER_SITE_OTHER): Payer: Medicare Other | Admitting: Internal Medicine

## 2015-06-07 VITALS — BP 140/88 | HR 67 | Temp 97.9°F | Wt 159.0 lb

## 2015-06-07 DIAGNOSIS — J0100 Acute maxillary sinusitis, unspecified: Secondary | ICD-10-CM | POA: Diagnosis not present

## 2015-06-07 DIAGNOSIS — D485 Neoplasm of uncertain behavior of skin: Secondary | ICD-10-CM | POA: Diagnosis not present

## 2015-06-07 DIAGNOSIS — D721 Eosinophilia, unspecified: Secondary | ICD-10-CM

## 2015-06-07 DIAGNOSIS — H109 Unspecified conjunctivitis: Secondary | ICD-10-CM

## 2015-06-07 MED ORDER — CEFDINIR 300 MG PO CAPS: 300.0000 mg | ORAL_CAPSULE | Freq: Two times a day (BID) | ORAL | Status: DC

## 2015-06-07 MED ORDER — TRAZODONE HCL 50 MG PO TABS: 50.0000 mg | ORAL_TABLET | Freq: Every evening | ORAL | Status: DC | PRN

## 2015-06-07 MED ORDER — ERYTHROMYCIN 5 MG/GM OP OINT: 1.0000 "application " | TOPICAL_OINTMENT | Freq: Three times a day (TID) | OPHTHALMIC | Status: DC

## 2015-06-07 NOTE — Assessment & Plan Note (Signed)
6/17 L abd

## 2015-06-07 NOTE — Assessment & Plan Note (Signed)
Erythro oint 

## 2015-06-07 NOTE — Progress Notes (Signed)
Subjective:  Patient ID: Ellen [name], female    DOB: 1950/02/27  Age: 65 y.o. MRN: [id number]  CC: No chief complaint on file.   HPI Ellen [name] presents for sinus pain, R ear pain; green nasal d/c C/o conjunctivitis x 2 d C/o moles on L abdomen  Outpatient Prescriptions Prior to Visit  Medication Sig Dispense Refill  . aspirin 81 MG tablet Take 81 mg by mouth daily.    [name] BREO ELLIPTA 100-25 MCG/INH AEPB INHALE ONE PUFF BY MOUTH ONCE DAILY 60 each 1  . Calcium Carbonate-Vitamin D (CALCIUM + D PO) Take by mouth.    . citalopram (CELEXA) 20 MG tablet Take 1 tablet (20 mg total) by mouth daily. 90 tablet 2  . docusate sodium (COLACE) 100 MG capsule Take 100 mg by mouth 2 (two) times daily.    . [place] encephalitis (JE-VAX) injection Inject 0.5 mLs into the skin once. Repeat in 4 weeks 2 each 0  . levothyroxine (SYNTHROID, LEVOTHROID) 112 MCG tablet Take 1 tablet (112 mcg total) by mouth daily. 90 tablet 3  . Melatonin 10 MG TABS Take 2 mg by mouth at bedtime and may repeat dose one time if needed.     . Multiple Vitamin (MULTIVITAMIN) tablet Take 1 tablet by mouth daily. Life's Abundance    . NON FORMULARY Allerfex  1 by mouth once daily as needed    . Probiotic Product ([place]) Take by mouth daily.    . promethazine-codeine (PHENERGAN WITH CODEINE) 6.25-10 MG/5ML syrup Take by mouth every 6 (six) hours as needed for cough.    . traZODone (DESYREL) 25 mg TABS tablet Take 25 mg by mouth at bedtime.    Cristino Martes Root 100 MG CAPS Take 1 capsule by mouth daily.      [name] zinc gluconate 50 MG tablet Take 50 mg by mouth daily.    . Astaxanthin 5 MG CAPS Take by mouth. Reported on 06/07/2015    . Biotin 5000 MCG CAPS Take by mouth. Reported on 06/07/2015    . Clindamycin Phosphate foam Apply topically 2 (two) times daily. Reported on 06/07/2015    . clobetasol (OLUX) 0.05 % topical foam Apply topically 2 (two) times daily. Reported on 06/07/2015    . Cranberry 1000 MG CAPS Take 1  capsule by mouth daily. Reported on 06/07/2015    . dextromethorphan-guaiFENesin (MUCINEX DM) 30-600 MG 12hr tablet Take 1 tablet by mouth 2 (two) times daily. Reported on 06/07/2015    . diphenhydrAMINE (SOMINEX) 25 MG tablet Take 25 mg by mouth at bedtime as needed for sleep. Reported on 06/07/2015    . Lysine 1000 MG TABS Take 1,000 mg by mouth once. Reported on 06/07/2015     No facility-administered medications prior to visit.    ROS Review of Systems  Constitutional: Negative for chills, activity change, appetite change, fatigue and unexpected weight change.  HENT: Positive for ear pain, postnasal drip and sinus pressure. Negative for congestion and mouth sores.   Eyes: Positive for pain and redness. Negative for visual disturbance.  Respiratory: Negative for cough and chest tightness.   Gastrointestinal: Negative for nausea and abdominal pain.  Genitourinary: Negative for frequency, difficulty urinating and vaginal pain.  Musculoskeletal: Negative for back pain and gait problem.  Skin: Negative for pallor and rash.  Neurological: Negative for dizziness, tremors, weakness, numbness and headaches.  Psychiatric/Behavioral: Negative for confusion and sleep disturbance.    Objective:  BP 140/88 mmHg  Pulse  67  Temp(Src) 97.9 F (36.6 C) (Oral)  Wt 159 lb (72.122 kg)  SpO2 97%  BP Readings from Last 3 Encounters:  06/07/15 140/88  04/25/15 131/75  03/27/15 130/88    Wt Readings from Last 3 Encounters:  06/07/15 159 lb (72.122 kg)  04/25/15 158 lb (71.668 kg)  03/27/15 158 lb (71.668 kg)    Physical Exam  Constitutional: She appears well-developed. No distress.  HENT:  Head: Normocephalic.  Right Ear: External ear normal.  Left Ear: External ear normal.  Eyes: Conjunctivae are normal. Pupils are equal, round, and reactive to light. Right eye exhibits no discharge. Left eye exhibits discharge.  Neck: Normal range of motion. Neck supple. No JVD present. No tracheal deviation  present. No thyromegaly present.  Cardiovascular: Normal rate, regular rhythm and normal heart sounds.   Pulmonary/Chest: No stridor. No respiratory distress. She has no wheezes.  Abdominal: Soft. Bowel sounds are normal. She exhibits no distension and no mass. There is no tenderness. There is no rebound and no guarding.  Musculoskeletal: She exhibits no edema or tenderness.  Lymphadenopathy:    She has no cervical adenopathy.  Neurological: She displays normal reflexes. No cranial nerve deficit. She exhibits normal muscle tone. Coordination normal.  Skin: No rash noted. No erythema.  Psychiatric: She has a normal mood and affect. Her behavior is normal. Judgment and thought content normal.  L ey w/eryth conjunctiva Nares w/erythema B TMs ok  Lab Results  Component Value Date   WBC 7.4 04/25/2015   HGB 13.6 04/25/2015   HCT 40.7 04/25/2015   PLT 208 04/25/2015   GLUCOSE 89 03/28/2015   CHOL 205* 12/14/2014   TRIG 160.0* 12/14/2014   HDL 55.60 12/14/2014   LDLDIRECT 142.6 07/09/2011   LDLCALC 118* 12/14/2014   ALT 19 12/14/2014   AST 24 12/14/2014   NA 141 03/28/2015   K 4.3 03/28/2015   CL 106 03/28/2015   CREATININE 0.76 03/28/2015   BUN 16 03/28/2015   CO2 30 03/28/2015   TSH 0.90 03/28/2015   HGBA1C 5.6 10/12/2006    No results found.  Assessment & Plan:   Diagnoses and all orders for this visit:  Eosinophilia   I am having Ms. Trautman maintain her NON FORMULARY, Cranberry, Valerian Root, Probiotic Product ([place]), Melatonin, multivitamin, Calcium Carbonate-Vitamin D (CALCIUM + D PO), Lysine, zinc gluconate, [place] encephalitis, citalopram, levothyroxine, Biotin, diphenhydrAMINE, traZODone, aspirin, docusate sodium, Astaxanthin, promethazine-codeine, clobetasol, Clindamycin Phosphate, dextromethorphan-guaiFENesin, and BREO ELLIPTA.  No orders of the defined types were placed in this encounter.     Follow-up: No Follow-up on file.  Walker Kehr, MD

## 2015-06-07 NOTE — Assessment & Plan Note (Signed)
Cefdinir po 

## 2015-06-07 NOTE — Progress Notes (Signed)
Pre visit review using our clinic review tool, if applicable. No additional management support is needed unless otherwise documented below in the visit note. 

## 2015-06-21 ENCOUNTER — Encounter: Payer: Self-pay | Admitting: Internal Medicine

## 2015-06-21 ENCOUNTER — Ambulatory Visit (INDEPENDENT_AMBULATORY_CARE_PROVIDER_SITE_OTHER): Payer: Medicare Other | Admitting: Internal Medicine

## 2015-06-21 VITALS — BP 130/82 | HR 60 | Wt 159.0 lb

## 2015-06-21 DIAGNOSIS — D485 Neoplasm of uncertain behavior of skin: Secondary | ICD-10-CM

## 2015-06-21 DIAGNOSIS — L905 Scar conditions and fibrosis of skin: Secondary | ICD-10-CM | POA: Diagnosis not present

## 2015-06-21 DIAGNOSIS — D489 Neoplasm of uncertain behavior, unspecified: Secondary | ICD-10-CM

## 2015-06-21 NOTE — Progress Notes (Signed)
Subjective:  Patient ID: Ellen [name], female    DOB: 1950-10-19  Age: 65 y.o. MRN: [id number]  CC: No chief complaint on file.   HPI Ellen [name] presents for skin bx  Outpatient Prescriptions Prior to Visit  Medication Sig Dispense Refill  . aspirin 81 MG tablet Take 81 mg by mouth daily.    . Astaxanthin 5 MG CAPS Take by mouth. Reported on 06/07/2015    . Biotin 5000 MCG CAPS Take by mouth. Reported on 06/07/2015    . BREO ELLIPTA 100-25 MCG/INH AEPB INHALE ONE PUFF BY MOUTH ONCE DAILY 60 each 1  . Calcium Carbonate-Vitamin D (CALCIUM + D PO) Take by mouth.    . cefdinir (OMNICEF) 300 MG capsule Take 1 capsule (300 mg total) by mouth 2 (two) times daily. 20 capsule 0  . citalopram (CELEXA) 20 MG tablet Take 1 tablet (20 mg total) by mouth daily. 90 tablet 2  . Clindamycin Phosphate foam Apply topically 2 (two) times daily. Reported on 06/07/2015    . clobetasol (OLUX) 0.05 % topical foam Apply topically 2 (two) times daily. Reported on 06/07/2015    . Cranberry 1000 MG CAPS Take 1 capsule by mouth daily. Reported on 06/07/2015    . dextromethorphan-guaiFENesin (MUCINEX DM) 30-600 MG 12hr tablet Take 1 tablet by mouth 2 (two) times daily. Reported on 06/07/2015    . diphenhydrAMINE (SOMINEX) 25 MG tablet Take 25 mg by mouth at bedtime as needed for sleep. Reported on 06/07/2015    . docusate sodium (COLACE) 100 MG capsule Take 100 mg by mouth 2 (two) times daily.    [name] erythromycin ophthalmic ointment Place 1 application into the left eye 3 (three) times daily. 3.5 g 0  . [place] encephalitis (JE-VAX) injection Inject 0.5 mLs into the skin once. Repeat in 4 weeks 2 each 0  . levothyroxine (SYNTHROID, LEVOTHROID) 112 MCG tablet Take 1 tablet (112 mcg total) by mouth daily. 90 tablet 3  . Lysine 1000 MG TABS Take 1,000 mg by mouth once. Reported on 06/07/2015    . Melatonin 10 MG TABS Take 2 mg by mouth at bedtime and may repeat dose one time if needed.     . Multiple Vitamin (MULTIVITAMIN) tablet  Take 1 tablet by mouth daily. Life's Abundance    . NON FORMULARY Allerfex  1 by mouth once daily as needed    . Probiotic Product ([place]) Take by mouth daily.    . traZODone (DESYREL) 50 MG tablet Take 1 tablet (50 mg total) by mouth at bedtime as needed. 60 tablet 5  . Valerian Root 100 MG CAPS Take 1 capsule by mouth daily.      [name] zinc gluconate 50 MG tablet Take 50 mg by mouth daily.     No facility-administered medications prior to visit.    ROS Review of Systems  Objective:  BP 130/82 mmHg  Pulse 60  Wt 159 lb (72.122 kg)  SpO2 98%  BP Readings from Last 3 Encounters:  06/21/15 130/82  06/07/15 140/88  04/25/15 131/75    Wt Readings from Last 3 Encounters:  06/21/15 159 lb (72.122 kg)  06/07/15 159 lb (72.122 kg)  04/25/15 158 lb (71.668 kg)    Physical Exam   Procedure Note :     Procedure :  Skin biopsy   Indication:   Suspicious lesion(s)   Risks including unsuccessful procedure , bleeding, infection, bruising, scar, a need for another complete procedure and others  were explained to the patient in detail as well as the benefits. Informed consent was obtained and signed.   The patient was placed in a decubitus position.  Lesion #1 on  Lower abdomen   measuring  5x6   mm   Skin over lesion #1  was prepped with Betadine and alcohol  and anesthetized with 1 cc of 2% lidocaine and epinephrine, using a 25-gauge 1 inch needle.  Shave biopsy with a sterile Dermablade was carried out in the usual fashion. Hyfrecator was used to destroy the rest of the lesion potentially left behind and for hemostasis. Band-Aid was applied with antibiotic ointment.     Lab Results  Component Value Date   WBC 7.4 04/25/2015   HGB 13.6 04/25/2015   HCT 40.7 04/25/2015   PLT 208 04/25/2015   GLUCOSE 89 03/28/2015   CHOL 205* 12/14/2014   TRIG 160.0* 12/14/2014   HDL 55.60 12/14/2014   LDLDIRECT 142.6 07/09/2011   LDLCALC 118* 12/14/2014   ALT 19  12/14/2014   AST 24 12/14/2014   NA 141 03/28/2015   K 4.3 03/28/2015   CL 106 03/28/2015   CREATININE 0.76 03/28/2015   BUN 16 03/28/2015   CO2 30 03/28/2015   TSH 0.90 03/28/2015   HGBA1C 5.6 10/12/2006    No results found.  Assessment & Plan:   Diagnoses and all orders for this visit:  Neoplasm of uncertain behavior -     Dermatology pathology   I am having Ellen [name] maintain her NON FORMULARY, Cranberry, Valerian Root, Probiotic Product ([place]), Melatonin, multivitamin, Calcium Carbonate-Vitamin D (CALCIUM + D PO), Lysine, zinc gluconate, [place] encephalitis, citalopram, levothyroxine, Biotin, diphenhydrAMINE, aspirin, docusate sodium, Astaxanthin, clobetasol, Clindamycin Phosphate, dextromethorphan-guaiFENesin, BREO ELLIPTA, cefdinir, erythromycin, and traZODone.  No orders of the defined types were placed in this encounter.     Follow-up: No Follow-up on file.  Walker Kehr, MD

## 2015-06-21 NOTE — Progress Notes (Signed)
Pre visit review using our clinic review tool, if applicable. No additional management support is needed unless otherwise documented below in the visit note. 

## 2015-06-21 NOTE — Assessment & Plan Note (Signed)
See procedure 

## 2015-07-13 DIAGNOSIS — S00252A Superficial foreign body of left eyelid and periocular area, initial encounter: Secondary | ICD-10-CM | POA: Diagnosis not present

## 2015-08-20 DIAGNOSIS — H52203 Unspecified astigmatism, bilateral: Secondary | ICD-10-CM | POA: Diagnosis not present

## 2015-08-20 DIAGNOSIS — H2513 Age-related nuclear cataract, bilateral: Secondary | ICD-10-CM | POA: Diagnosis not present

## 2015-08-20 DIAGNOSIS — H524 Presbyopia: Secondary | ICD-10-CM | POA: Diagnosis not present

## 2015-08-28 ENCOUNTER — Ambulatory Visit: Payer: Medicare Other | Admitting: Hematology & Oncology

## 2015-08-28 ENCOUNTER — Other Ambulatory Visit: Payer: Medicare Other

## 2015-08-30 ENCOUNTER — Other Ambulatory Visit: Payer: Medicare Other

## 2015-08-30 ENCOUNTER — Ambulatory Visit: Payer: Medicare Other | Admitting: Hematology & Oncology

## 2015-08-31 ENCOUNTER — Ambulatory Visit (INDEPENDENT_AMBULATORY_CARE_PROVIDER_SITE_OTHER): Payer: Medicare Other | Admitting: Internal Medicine

## 2015-08-31 ENCOUNTER — Encounter: Payer: Self-pay | Admitting: Internal Medicine

## 2015-08-31 VITALS — BP 108/70 | HR 66 | Temp 98.4°F | Wt 160.0 lb

## 2015-08-31 DIAGNOSIS — M7631 Iliotibial band syndrome, right leg: Secondary | ICD-10-CM | POA: Diagnosis not present

## 2015-08-31 DIAGNOSIS — L03119 Cellulitis of unspecified part of limb: Secondary | ICD-10-CM | POA: Diagnosis not present

## 2015-08-31 DIAGNOSIS — L02419 Cutaneous abscess of limb, unspecified: Secondary | ICD-10-CM

## 2015-08-31 MED ORDER — DOXYCYCLINE HYCLATE 100 MG PO TABS
100.0000 mg | ORAL_TABLET | Freq: Two times a day (BID) | ORAL | 0 refills | Status: DC
Start: 2015-08-31 — End: 2015-12-17

## 2015-08-31 MED ORDER — MUPIROCIN 2 % EX OINT: TOPICAL_OINTMENT | CUTANEOUS | 0 refills | Status: DC

## 2015-08-31 NOTE — Progress Notes (Signed)
Subjective:  Patient ID: Ellen [name], female    DOB: [birth year]  Age: 65 y.o. MRN: [id number]  CC: Recurrent Skin Infections (near rectum); Hip Pain (Right); and Weight Gain (states she has been eating low carb diet and training for her upcoming [place] bike ride)   HPI Ellen [name] presents for wt gain C/o R hip pain - riding 80 mi a week C/o pustule near anus x 1 d - sore  Outpatient Medications Prior to Visit  Medication Sig Dispense Refill  . aspirin 81 MG tablet Take 81 mg by mouth daily.    . Astaxanthin 5 MG CAPS Take by mouth. Reported on 06/07/2015    . Biotin 5000 MCG CAPS Take by mouth. Reported on 06/07/2015    . BREO ELLIPTA 100-25 MCG/INH AEPB INHALE ONE PUFF BY MOUTH ONCE DAILY 60 each 1  . Calcium Carbonate-Vitamin D (CALCIUM + D PO) Take by mouth.    . cefdinir (OMNICEF) 300 MG capsule Take 1 capsule (300 mg total) by mouth 2 (two) times daily. 20 capsule 0  . citalopram (CELEXA) 20 MG tablet Take 1 tablet (20 mg total) by mouth daily. 90 tablet 2  . Clindamycin Phosphate foam Apply topically 2 (two) times daily. Reported on 06/07/2015    . clobetasol (OLUX) 0.05 % topical foam Apply topically 2 (two) times daily. Reported on 06/07/2015    . Cranberry 1000 MG CAPS Take 1 capsule by mouth daily. Reported on 06/07/2015    . dextromethorphan-guaiFENesin (MUCINEX DM) 30-600 MG 12hr tablet Take 1 tablet by mouth 2 (two) times daily. Reported on 06/07/2015    . diphenhydrAMINE (SOMINEX) 25 MG tablet Take 25 mg by mouth at bedtime as needed for sleep. Reported on 06/07/2015    . docusate sodium (COLACE) 100 MG capsule Take 100 mg by mouth 2 (two) times daily.    [name] erythromycin ophthalmic ointment Place 1 application into the left eye 3 (three) times daily. 3.5 g 0  . [place] encephalitis (JE-VAX) injection Inject 0.5 mLs into the skin once. Repeat in 4 weeks 2 each 0  . levothyroxine (SYNTHROID, LEVOTHROID) 112 MCG tablet Take 1 tablet (112 mcg total) by mouth daily. 90 tablet 3  . Lysine 1000  MG TABS Take 1,000 mg by mouth once. Reported on 06/07/2015    . Melatonin 10 MG TABS Take 2 mg by mouth at bedtime and may repeat dose one time if needed.     . Multiple Vitamin (MULTIVITAMIN) tablet Take 1 tablet by mouth daily. Life's Abundance    . NON FORMULARY Allerfex  1 by mouth once daily as needed    . Probiotic Product ([place]) Take by mouth daily.    . traZODone (DESYREL) 50 MG tablet Take 1 tablet (50 mg total) by mouth at bedtime as needed. 60 tablet 5  . Valerian Root 100 MG CAPS Take 1 capsule by mouth daily.      [name] zinc gluconate 50 MG tablet Take 50 mg by mouth daily.     No facility-administered medications prior to visit.     ROS Review of Systems  Constitutional: Negative for activity change, appetite change, chills, fatigue and unexpected weight change.  HENT: Negative for congestion, mouth sores and sinus pressure.   Eyes: Negative for visual disturbance.  Respiratory: Negative for cough and chest tightness.   Gastrointestinal: Negative for abdominal pain and nausea.  Genitourinary: Negative for difficulty urinating, frequency and vaginal pain.  Musculoskeletal: Negative for back pain  and gait problem.  Skin: Negative for pallor and rash.  Neurological: Negative for dizziness, tremors, weakness, numbness and headaches.  Psychiatric/Behavioral: Negative for confusion and sleep disturbance.    Objective:  BP 108/70   Pulse 66   Temp 98.4 F (36.9 C) (Oral)   Wt 160 lb (72.6 kg)   SpO2 98%   BMI 28.34 kg/m   BP Readings from Last 3 Encounters:  08/31/15 108/70  06/21/15 130/82  06/07/15 140/88    Wt Readings from Last 3 Encounters:  08/31/15 160 lb (72.6 kg)  06/21/15 159 lb (72.1 kg)  06/07/15 159 lb (72.1 kg)    Physical Exam  Constitutional: She appears well-developed. No distress.  HENT:  Head: Normocephalic.  Right Ear: External ear normal.  Left Ear: External ear normal.  Nose: Nose normal.  Mouth/Throat: Oropharynx is  clear and moist.  Eyes: Conjunctivae are normal. Pupils are equal, round, and reactive to light. Right eye exhibits no discharge. Left eye exhibits no discharge.  Neck: Normal range of motion. Neck supple. No JVD present. No tracheal deviation present. No thyromegaly present.  Cardiovascular: Normal rate, regular rhythm and normal heart sounds.   Pulmonary/Chest: No stridor. No respiratory distress. She has no wheezes.  Abdominal: Soft. Bowel sounds are normal. She exhibits no distension and no mass. There is no tenderness. There is no rebound and no guarding.  Musculoskeletal: She exhibits no edema or tenderness.  Lymphadenopathy:    She has no cervical adenopathy.  Neurological: She displays normal reflexes. No cranial nerve deficit. She exhibits normal muscle tone. Coordination normal.  Skin: No rash noted. No erythema.  Psychiatric: She has a normal mood and affect. Her behavior is normal. Judgment and thought content normal.    4 mm pustula on L buttock  Procedure note:  Incision and Drainage of an Abscess   Indication : a localized collection of pus that is tender and not spontaneously resolving.    Risks including unsuccessful procedure , possible need for a repeat procedure due to pus accumulation, scar formation, and others as well as benefits were explained to the patient in detail. Written consent was obtained/signed.    The patient was placed in a decubitus position. The area of an abscess was prepped with povidone-iodine and draped in a sterile fashion. Local anesthesia with   2    cc of 2% lidocaine and epinephrine  was administered.  1 cm incision with #11strait [name] was made. About 3 cc of purulent material was expressed. The abscess cavity was explored with a sterile hemostat and the walled- off pockets and septae were broken down bluntly. The cavity was irrigated with the rest of the anesthetic in the syringe and packed with 3 inches of  the iodoform gauze.   The wound  was dressed with antibiotic ointment and Telfa pad.  Tolerated well. Complications: None.   Wound instructions provided.   Wound instructions : change dressing once a day or twice a day is needed. Change dressing after  shower in the morning.  Pat dry the wound with gauze. Pull out one inch of packing everyday and cut it off. Re-dress wound with antibiotic ointment and Telfa pad or a Band-Aid of appropriate size.   Please contact us if you notice a recollection of pus in the abscess fever and chills increased pain redness red streaks near the abscess increased swelling in the area.   Lab Results  Component Value Date   WBC 7.4 04/25/2015   HGB 13.6 04/25/2015  HCT 40.7 04/25/2015   PLT 208 04/25/2015   GLUCOSE 89 03/28/2015   CHOL 205 (H) 12/14/2014   TRIG 160.0 (H) 12/14/2014   HDL 55.60 12/14/2014   LDLDIRECT 142.6 07/09/2011   LDLCALC 118 (H) 12/14/2014   ALT 19 12/14/2014   AST 24 12/14/2014   NA 141 03/28/2015   K 4.3 03/28/2015   CL 106 03/28/2015   CREATININE 0.76 03/28/2015   BUN 16 03/28/2015   CO2 30 03/28/2015   TSH 0.90 03/28/2015   HGBA1C 5.6 10/12/2006    No results found.  Assessment & Plan:   There are no diagnoses linked to this encounter. I am having Ms. Crandle maintain her NON FORMULARY, Cranberry, Valerian Root, Probiotic Product ([place]), Melatonin, multivitamin, Calcium Carbonate-Vitamin D (CALCIUM + D PO), Lysine, zinc gluconate, [place] encephalitis, citalopram, levothyroxine, Biotin, diphenhydrAMINE, aspirin, docusate sodium, Astaxanthin, clobetasol, Clindamycin Phosphate, dextromethorphan-guaiFENesin, BREO ELLIPTA, cefdinir, erythromycin, and traZODone.  No orders of the defined types were placed in this encounter.    Follow-up: No Follow-up on file.  Walker Kehr, MD

## 2015-08-31 NOTE — Assessment & Plan Note (Signed)
Doxy Bactroban Wet wipes See procedure

## 2015-08-31 NOTE — Progress Notes (Signed)
Pre visit review using our clinic review tool, if applicable. No additional management support is needed unless otherwise documented below in the visit note. 

## 2015-08-31 NOTE — Patient Instructions (Signed)
Look up gluteus and IT band stretch

## 2015-09-22 NOTE — Progress Notes (Signed)
[name] [address], [place] [address] Phone: [phone] Subjective:    I'm seeing this patient by the request  of:  Walker Kehr, MD   CC: Hip pain  [id number]  Ellen [name] is a 65 y.o. female coming in with complaint of right-sided hip pain. Patient has been riding bike a significant amount. Saw primary care provider and was sent here for further evaluation. Patient states It seems to be on the side of her hip. Patient states that when she is by gravity and seems to be very minimal but seems to be worse at night. Can wake her up. Some mild radiation down the leg but very minimal. Patient rates the severity of pain a 6 out of 10. Continues to stay active. Does not remember any true injury.     Past Medical History:  Diagnosis Date  . ALLERGIC RHINITIS   . ANXIETY   . GERD   . INSOMNIA-SLEEP DISORDER-UNSPEC   . Thyroid disease    Past Surgical History:  Procedure Laterality Date  . ANKLE FRACTURE SURGERY  2012   R  . APPENDECTOMY    . BREAST SURGERY     REDUCTION MAMMOPLASTY  . CESAREAN SECTION     twice  . COSMETIC SURGERY    . FACELIFT     s/p  . TONSILLECTOMY  1974  . Upper nl TSH w/sx    . VAGINAL HYSTERECTOMY  1991   TVH, irregular bleeding   Social History   Social History  . Marital status: Married    Spouse name: N/A  . Number of children: 2  . Years of education: N/A   Occupational History  . Retired Sports coach Employed   Social History Main Topics  . Smoking status: Former Research scientist (life sciences)  . Smokeless tobacco: Never Used  . Alcohol use 0.0 oz/week     Comment: Rare  . Drug use: No  . Sexual activity: Yes    Birth control/ protection: Post-menopausal, Surgical     Comment: HYST-1st intercourse 65 yo-Fewer than 5 partners   Other Topics Concern  . Not on file   Social History Narrative   Regular exercise   2 biol children-1 disabled with schizophrenia & 1 stepchild   Allergies  Allergen Reactions  .  Belviq [Lorcaserin Hcl]     palpitations  . Demerol [Meperidine]     palpitations   Family History  Problem Relation Age of Onset  . Heart disease Father     CAD/CABG in his 46's  . Hypertension Mother   . Heart failure Mother     Past medical history, social, surgical and family history all reviewed in electronic medical record.  No pertanent information unless stated regarding to the chief complaint.   Review of Systems: No headache, visual changes, nausea, vomiting, diarrhea, constipation, dizziness, abdominal pain, skin rash, fevers, chills, night sweats, weight loss, swollen lymph nodes, body aches, joint swelling, muscle aches, chest pain, shortness of breath, mood changes.   Objective  There were no vitals taken for this visit.  General: No apparent distress alert and oriented x3 mood and affect normal, dressed appropriately.  HEENT: Pupils equal, extraocular movements intact  Respiratory: Patient's speak in full sentences and does not appear short of breath  Cardiovascular: No lower extremity edema, non tender, no erythema  Skin: Warm dry intact with no signs of infection or rash on extremities or on axial skeleton.  Abdomen: Soft nontender  Neuro: Cranial nerves II  through XII are intact, neurovascularly intact in all extremities with 2+ DTRs and 2+ pulses.  Lymph: No lymphadenopathy of posterior or anterior cervical chain or axillae bilaterally.  Gait normal with good balance and coordination.  MSK:  Non tender with full range of motion and good stability and symmetric strength and tone of shoulders, elbows, wrist, knee and ankles bilaterally.  [id number]  ROM IR: 25 Deg, ER: 45 Deg, Flexion: 120 Deg, Extension: 100 Deg, Abduction: 45 Deg, Adduction: 25 Deg Strength IR: 5/5, ER: 5/5, Flexion: 5/5, Extension: 5/5, Abduction: 5/5, Adduction: 5/5 Pelvic alignment unremarkable to inspection and palpation. Standing hip rotation and gait without trendelenburg sign /  unsteadiness. Greater trochanter with moderate to severe tenderness to palpation No tenderness over piriformis . No pain with FABER or FADIR. No SI joint tenderness and normal minimal SI movement. Contralateral hip unremarkable  procedure note 97110; 15 minutes spent for Therapeutic exercises as stated in above notes.  This included exercises focusing on stretching, strengthening, with significant focus on eccentric aspects. Hip strengthening exercises which included:  Pelvic tilt/bracing to help with proper recruitment of the lower abs and pelvic floor muscles  Glute strengthening to properly contract glutes without over-engaging low back and hamstrings - prone hip extension and glute bridge exercises Proper stretching techniques to increase effectiveness for the hip flexors, groin, quads, piriformic and low back when appropriate     Proper technique shown and discussed handout in great detail with ATC.  All questions were discussed and answered.     Impression and Recommendations:     This case required medical decision making of moderate complexity.      Note: This dictation was prepared with Dragon dictation along with smaller phrase technology. Any transcriptional errors that result from this process are unintentional.

## 2015-09-24 ENCOUNTER — Ambulatory Visit (INDEPENDENT_AMBULATORY_CARE_PROVIDER_SITE_OTHER): Payer: Medicare Other | Admitting: Family Medicine

## 2015-09-24 ENCOUNTER — Encounter: Payer: Self-pay | Admitting: Family Medicine

## 2015-09-24 DIAGNOSIS — M7061 Trochanteric bursitis, right hip: Secondary | ICD-10-CM | POA: Diagnosis not present

## 2015-09-24 NOTE — Patient Instructions (Signed)
Good to see you  Ice 20 minutes 2 times daily. Usually after activity and before bed. Exercises 3 times a week.  pennsaid pinkie amount topically 2 times daily as needed.  We need to strengthen the outside of the hip and that is what we are going to focus on.  Eat every 2 hours. 100 calories With biking gatoade in your water 1:2 ratio is great  See me agaion in 3 weeks and if not better we will inject hip

## 2015-09-24 NOTE — Assessment & Plan Note (Signed)
Patient is more of a bursitis. Patient once in trying conservative therapy. Home exercises given patient work with Product/process development scientist. We discussed icing regimen and home exercises. We discussed which activities to do a which was potentially avoid. Patient still work on hip abductor strengthening. Patient come back again in 3 weeks. If continuing have pain we'll consider injection.

## 2015-10-02 ENCOUNTER — Ambulatory Visit (INDEPENDENT_AMBULATORY_CARE_PROVIDER_SITE_OTHER): Payer: Medicare Other

## 2015-10-02 DIAGNOSIS — Z23 Encounter for immunization: Secondary | ICD-10-CM | POA: Diagnosis not present

## 2015-10-12 NOTE — Progress Notes (Signed)
[name] [address] [place], [place] [address] Phone: [phone] Subjective:    I'm seeing this patient by the request  of:  Walker Kehr, MD   CC: Hip pain f/u  [id number]  Ellen [name] is a 65 y.o. female coming in with complaint of right-sided hip pain. Patient was sent have a greater trochanter bursitis on the right side. Patient is been doing the home exercises and icing protocol. An states that it has improved approximately 30-40%. Not having as much pain. Patient will be traveling out of town and is concerned that she'll not be able to do the exercises on a regular basis. Once to stop taking the medication as regularly as well. Patient is wondering if an injection would be helpful.      Past Medical History:  Diagnosis Date  . ALLERGIC RHINITIS   . ANXIETY   . GERD   . INSOMNIA-SLEEP DISORDER-UNSPEC   . Thyroid disease    Past Surgical History:  Procedure Laterality Date  . ANKLE FRACTURE SURGERY  2012   R  . APPENDECTOMY    . BREAST SURGERY     REDUCTION MAMMOPLASTY  . CESAREAN SECTION     twice  . COSMETIC SURGERY    . FACELIFT     s/p  . TONSILLECTOMY  1974  . Upper nl TSH w/sx    . VAGINAL HYSTERECTOMY  1991   TVH, irregular bleeding   Social History   Social History  . Marital status: Married    Spouse name: N/A  . Number of children: 2  . Years of education: N/A   Occupational History  . Retired Sports coach Employed   Social History Main Topics  . Smoking status: Former Research scientist (life sciences)  . Smokeless tobacco: Never Used  . Alcohol use 0.0 oz/week     Comment: Rare  . Drug use: No  . Sexual activity: Yes    Birth control/ protection: Post-menopausal, Surgical     Comment: HYST-1st intercourse 65 yo-Fewer than 5 partners   Other Topics Concern  . None   Social History Narrative   Regular exercise   2 biol children-1 disabled with schizophrenia & 1 stepchild   Allergies  Allergen Reactions  . Belviq [Lorcaserin  Hcl]     palpitations  . Demerol [Meperidine]     palpitations   Family History  Problem Relation Age of Onset  . Heart disease Father     CAD/CABG in his 103's  . Hypertension Mother   . Heart failure Mother     Past medical history, social, surgical and family history all reviewed in electronic medical record.  No pertanent information unless stated regarding to the chief complaint.   Review of Systems: No headache, visual changes, nausea, vomiting, diarrhea, constipation, dizziness, abdominal pain, skin rash, fevers, chills, night sweats, weight loss, swollen lymph nodes, body aches, joint swelling, muscle aches, chest pain, shortness of breath, mood changes.   Objective  Blood pressure 106/80, pulse 64, weight 159 lb (72.1 kg), SpO2 98 %.  General: No apparent distress alert and oriented x3 mood and affect normal, dressed appropriately.  HEENT: Pupils equal, extraocular movements intact  Respiratory: Patient's speak in full sentences and does not appear short of breath  Cardiovascular: No lower extremity edema, non tender, no erythema  Skin: Warm dry intact with no signs of infection or rash on extremities or on axial skeleton.  Abdomen: Soft nontender  Neuro: Cranial nerves II through XII are intact,  neurovascularly intact in all extremities with 2+ DTRs and 2+ pulses.  Lymph: No lymphadenopathy of posterior or anterior cervical chain or axillae bilaterally.  Gait normal with good balance and coordination.  MSK:  Non tender with full range of motion and good stability and symmetric strength and tone of shoulders, elbows, wrist, knee and ankles bilaterally.  [id number]  ROM IR: 25 Deg, ER: 45 Deg, Flexion: 120 Deg, Extension: 100 Deg, Abduction: 45 Deg, Adduction: 25 Deg Strength IR: 5/5, ER: 5/5, Flexion: 5/5, Extension: 5/5, Abduction: 5/5, Adduction: 5/5 Pelvic alignment unremarkable to inspection and palpation. Standing hip rotation and gait without trendelenburg sign /  unsteadiness. Greater trochanter with moderate  tenderness to palpation No tenderness over piriformis . No pain with FABER or FADIR. No SI joint tenderness and normal minimal SI movement. Contralateral hip unremarkable Mild improvement from previous exam   Procedure: Real-time Ultrasound Guided Injection of right greater trochanteric bursitis secondary to patient's body habitus Device: GE Logiq E  Ultrasound guided injection is preferred based studies that show increased duration, increased effect, greater accuracy, decreased procedural pain, increased response rate, and decreased cost with ultrasound guided versus blind injection.  Verbal informed consent obtained.  Time-out conducted.  Noted no overlying erythema, induration, or other signs of local infection.  Skin prepped in a sterile fashion.  Local anesthesia: Topical Ethyl chloride.  With sterile technique and under real time ultrasound guidance:  Greater trochanteric area was visualized and patient's bursa was noted. A 22-gauge 3 inch needle was inserted and 4 cc of 0.5% Marcaine and 1 cc of Kenalog 40 mg/dL was injected. Pictures taken Completed without difficulty  Pain immediately resolved suggesting accurate placement of the medication.  Advised to call if fevers/chills, erythema, induration, drainage, or persistent bleeding.  Images permanently stored and available for review in the ultrasound unit.  Impression: Technically successful ultrasound guided injection.    Impression and Recommendations:     This case required medical decision making of moderate complexity.      Note: This dictation was prepared with Dragon dictation along with smaller phrase technology. Any transcriptional errors that result from this process are unintentional.

## 2015-10-15 ENCOUNTER — Ambulatory Visit (INDEPENDENT_AMBULATORY_CARE_PROVIDER_SITE_OTHER): Payer: Medicare Other | Admitting: Family Medicine

## 2015-10-15 ENCOUNTER — Encounter: Payer: Self-pay | Admitting: Family Medicine

## 2015-10-15 DIAGNOSIS — M7061 Trochanteric bursitis, right hip: Secondary | ICD-10-CM

## 2015-10-15 NOTE — Assessment & Plan Note (Signed)
Given injection today and tolerated the procedure well. We discussed icing regimen, continue the topical anti-inflammatories in the over-the-counter medications. Encourage her to continue home exercises. We discussed proper shoes. Follow-up again in 4 weeks. If worsening symptoms consider x-ray of patient's back as well as formal physical therapy.

## 2015-10-15 NOTE — Patient Instructions (Signed)
Good to see you  We injected the hip today on the outside and should help Ice is still good for you  pennsaid pinkie amount topically 2 times daily as needed.  See me again in 4 weeks if not completely gone.

## 2015-11-09 ENCOUNTER — Encounter: Payer: Self-pay | Admitting: Internal Medicine

## 2015-11-12 ENCOUNTER — Other Ambulatory Visit: Payer: Self-pay | Admitting: Internal Medicine

## 2015-11-12 DIAGNOSIS — E034 Atrophy of thyroid (acquired): Secondary | ICD-10-CM

## 2015-11-21 ENCOUNTER — Ambulatory Visit: Payer: Medicare Other | Admitting: Family Medicine

## 2015-12-17 ENCOUNTER — Ambulatory Visit (INDEPENDENT_AMBULATORY_CARE_PROVIDER_SITE_OTHER): Payer: Medicare Other | Admitting: Internal Medicine

## 2015-12-17 ENCOUNTER — Other Ambulatory Visit (INDEPENDENT_AMBULATORY_CARE_PROVIDER_SITE_OTHER): Payer: Medicare Other

## 2015-12-17 ENCOUNTER — Ambulatory Visit (INDEPENDENT_AMBULATORY_CARE_PROVIDER_SITE_OTHER): Payer: Medicare Other | Admitting: Gynecology

## 2015-12-17 ENCOUNTER — Encounter: Payer: Self-pay | Admitting: Gynecology

## 2015-12-17 ENCOUNTER — Encounter: Payer: Self-pay | Admitting: Internal Medicine

## 2015-12-17 VITALS — BP 124/78 | Ht 63.0 in | Wt 163.0 lb

## 2015-12-17 VITALS — BP 118/82 | HR 67 | Temp 97.8°F | Wt 163.0 lb

## 2015-12-17 DIAGNOSIS — E785 Hyperlipidemia, unspecified: Secondary | ICD-10-CM

## 2015-12-17 DIAGNOSIS — F411 Generalized anxiety disorder: Secondary | ICD-10-CM | POA: Diagnosis not present

## 2015-12-17 DIAGNOSIS — D721 Eosinophilia, unspecified: Secondary | ICD-10-CM

## 2015-12-17 DIAGNOSIS — Z0000 Encounter for general adult medical examination without abnormal findings: Secondary | ICD-10-CM

## 2015-12-17 DIAGNOSIS — R635 Abnormal weight gain: Secondary | ICD-10-CM

## 2015-12-17 DIAGNOSIS — N952 Postmenopausal atrophic vaginitis: Secondary | ICD-10-CM | POA: Diagnosis not present

## 2015-12-17 DIAGNOSIS — Z01411 Encounter for gynecological examination (general) (routine) with abnormal findings: Secondary | ICD-10-CM

## 2015-12-17 LAB — HEPATIC FUNCTION PANEL
ALT: 17 U/L (ref 0–35)
AST: 20 U/L (ref 0–37)
Albumin: 4.5 g/dL (ref 3.5–5.2)
Alkaline Phosphatase: 79 U/L (ref 39–117)
Bilirubin, Direct: 0 mg/dL (ref 0.0–0.3)
Total Bilirubin: 0.4 mg/dL (ref 0.2–1.2)
Total Protein: 7.3 g/dL (ref 6.0–8.3)

## 2015-12-17 LAB — URINALYSIS, ROUTINE W REFLEX MICROSCOPIC
Bilirubin Urine: NEGATIVE
Hgb urine dipstick: NEGATIVE
Ketones, ur: NEGATIVE
Nitrite: NEGATIVE
Specific Gravity, Urine: 1.01 (ref 1.000–1.030)
Total Protein, Urine: NEGATIVE
Urine Glucose: NEGATIVE
Urobilinogen, UA: 0.2 (ref 0.0–1.0)
pH: 7 (ref 5.0–8.0)

## 2015-12-17 LAB — BASIC METABOLIC PANEL
BUN: 18 mg/dL (ref 6–23)
CO2: 29 mEq/L (ref 19–32)
Calcium: 9.2 mg/dL (ref 8.4–10.5)
Chloride: 104 mEq/L (ref 96–112)
Creatinine, Ser: 0.71 mg/dL (ref 0.40–1.20)
GFR: 87.21 mL/min (ref >60.00)
Glucose, Bld: 94 mg/dL (ref 70–99)
Potassium: 4.2 mEq/L (ref 3.5–5.1)
Sodium: 141 mEq/L (ref 135–145)

## 2015-12-17 LAB — LIPID PANEL
Cholesterol: 230 mg/dL — ABNORMAL HIGH (ref 0–200)
HDL: 73.2 mg/dL (ref >39.00)
LDL Cholesterol: 121 mg/dL — ABNORMAL HIGH (ref 0–99)
NonHDL: 157.11
Total CHOL/HDL Ratio: 3
Triglycerides: 181 mg/dL — ABNORMAL HIGH (ref 0.0–149.0)
VLDL: 36.2 mg/dL (ref 0.0–40.0)

## 2015-12-17 LAB — CBC WITH DIFFERENTIAL/PLATELET
Basophils Absolute: 0.1 10*3/uL (ref 0.0–0.1)
Basophils Relative: 0.8 % (ref 0.0–3.0)
Eosinophils Absolute: 0.6 10*3/uL (ref 0.0–0.7)
Eosinophils Relative: 8.2 % — ABNORMAL HIGH (ref 0.0–5.0)
HCT: 41.9 % (ref 36.0–46.0)
Hemoglobin: 14 g/dL (ref 12.0–15.0)
Lymphocytes Relative: 35.2 % (ref 12.0–46.0)
Lymphs Abs: 2.6 10*3/uL (ref 0.7–4.0)
MCHC: 33.4 g/dL (ref 30.0–36.0)
MCV: 89.7 fl (ref 78.0–100.0)
Monocytes Absolute: 0.7 10*3/uL (ref 0.1–1.0)
Monocytes Relative: 9.2 % (ref 3.0–12.0)
Neutro Abs: 3.4 10*3/uL (ref 1.4–7.7)
Neutrophils Relative %: 46.6 % (ref 43.0–77.0)
Platelets: 227 10*3/uL (ref 150.0–400.0)
RBC: 4.68 Mil/uL (ref 3.87–5.11)
RDW: 15.3 % (ref 11.5–15.5)
WBC: 7.3 10*3/uL (ref 4.0–10.5)

## 2015-12-17 LAB — TSH: TSH: 3.05 u[IU]/mL (ref 0.35–4.50)

## 2015-12-17 NOTE — Assessment & Plan Note (Signed)
CBC

## 2015-12-17 NOTE — Progress Notes (Signed)
Subjective:  Patient ID: Ellen [name], female    DOB: 1950-05-19  Age: 65 y.o. MRN: [id number]  CC: No chief complaint on file.   HPI Ellen [name] presents for a well exam Hip pain is better  Outpatient Medications Prior to Visit  Medication Sig Dispense Refill  . aspirin 81 MG tablet Take 81 mg by mouth daily.    . Astaxanthin 5 MG CAPS Take by mouth. Reported on 06/07/2015    . Biotin 5000 MCG CAPS Take by mouth. Reported on 06/07/2015    . BREO ELLIPTA 100-25 MCG/INH AEPB INHALE ONE PUFF BY MOUTH ONCE DAILY (Patient not taking: Reported on 12/17/2015) 60 each 1  . Calcium Carbonate-Vitamin D (CALCIUM + D PO) Take by mouth.    . cefdinir (OMNICEF) 300 MG capsule Take 1 capsule (300 mg total) by mouth 2 (two) times daily. (Patient not taking: Reported on 12/17/2015) 20 capsule 0  . citalopram (CELEXA) 20 MG tablet Take 1 tablet (20 mg total) by mouth daily. 90 tablet 2  . Clindamycin Phosphate foam Apply topically 2 (two) times daily. Reported on 06/07/2015    . clobetasol (OLUX) 0.05 % topical foam Apply topically 2 (two) times daily. Reported on 06/07/2015    . Cranberry 1000 MG CAPS Take 1 capsule by mouth daily. Reported on 06/07/2015    . dextromethorphan-guaiFENesin (MUCINEX DM) 30-600 MG 12hr tablet Take 1 tablet by mouth 2 (two) times daily. Reported on 06/07/2015    . diphenhydrAMINE (SOMINEX) 25 MG tablet Take 25 mg by mouth at bedtime as needed for sleep. Reported on 06/07/2015    . docusate sodium (COLACE) 100 MG capsule Take 100 mg by mouth 2 (two) times daily.    [name] doxycycline (VIBRA-TABS) 100 MG tablet Take 1 tablet (100 mg total) by mouth 2 (two) times daily. (Patient not taking: Reported on 12/17/2015) 14 tablet 0  . erythromycin ophthalmic ointment Place 1 application into the left eye 3 (three) times daily. (Patient not taking: Reported on 12/17/2015) 3.5 g 0  . [place] encephalitis (JE-VAX) injection Inject 0.5 mLs into the skin once. Repeat in 4 weeks 2 each 0  . levothyroxine  (SYNTHROID, LEVOTHROID) 112 MCG tablet Take 1 tablet (112 mcg total) by mouth daily. 90 tablet 3  . Lysine 1000 MG TABS Take 1,000 mg by mouth once. Reported on 06/07/2015    . Melatonin 10 MG TABS Take 2 mg by mouth at bedtime and may repeat dose one time if needed.     . Multiple Vitamin (MULTIVITAMIN) tablet Take 1 tablet by mouth daily. Life's Abundance    . mupirocin ointment (BACTROBAN) 2 % Use bid (Patient not taking: Reported on 12/17/2015) 22 g 0  . NON FORMULARY Allerfex  1 by mouth once daily as needed    . Probiotic Product ([address] [place]) Take by mouth daily.    . traZODone (DESYREL) 50 MG tablet Take 1 tablet (50 mg total) by mouth at bedtime as needed. 60 tablet 5  . Valerian Root 100 MG CAPS Take 1 capsule by mouth daily.      [name] zinc gluconate 50 MG tablet Take 50 mg by mouth daily.     No facility-administered medications prior to visit.     ROS Review of Systems  Constitutional: Negative for activity change, appetite change, chills, fatigue and unexpected weight change.  HENT: Negative for congestion, mouth sores and sinus pressure.   Eyes: Negative for visual disturbance.  Respiratory: Negative for cough  and chest tightness.   Gastrointestinal: Negative for abdominal pain and nausea.  Genitourinary: Negative for difficulty urinating, frequency and vaginal pain.  Musculoskeletal: Negative for back pain and gait problem.  Skin: Negative for pallor and rash.  Neurological: Negative for dizziness, tremors, weakness, numbness and headaches.  Psychiatric/Behavioral: Negative for confusion and sleep disturbance.    Objective:  BP 118/82   Pulse 67   Temp 97.8 F (36.6 C)   Wt 163 lb (73.9 kg)   SpO2 100%   BMI 28.87 kg/m   BP Readings from Last 3 Encounters:  12/17/15 118/82  10/15/15 106/80  09/24/15 128/80    Wt Readings from Last 3 Encounters:  12/17/15 163 lb (73.9 kg)  10/15/15 159 lb (72.1 kg)  09/24/15 160 lb (72.6 kg)    Physical Exam    Constitutional: She appears well-developed. No distress.  HENT:  Head: Normocephalic.  Right Ear: External ear normal.  Left Ear: External ear normal.  Nose: Nose normal.  Mouth/Throat: Oropharynx is clear and moist.  Eyes: Conjunctivae are normal. Pupils are equal, round, and reactive to light. Right eye exhibits no discharge. Left eye exhibits no discharge.  Neck: Normal range of motion. Neck supple. No JVD present. No tracheal deviation present. No thyromegaly present.  Cardiovascular: Normal rate, regular rhythm and normal heart sounds.   Pulmonary/Chest: No stridor. No respiratory distress. She has no wheezes.  Abdominal: Soft. Bowel sounds are normal. She exhibits no distension and no mass. There is no tenderness. There is no rebound and no guarding.  Musculoskeletal: She exhibits no edema or tenderness.  Lymphadenopathy:    She has no cervical adenopathy.  Neurological: She displays normal reflexes. No cranial nerve deficit. She exhibits normal muscle tone. Coordination normal.  Skin: No rash noted. No erythema.  Psychiatric: She has a normal mood and affect. Her behavior is normal. Judgment and thought content normal.    Lab Results  Component Value Date   WBC 7.4 04/25/2015   HGB 13.6 04/25/2015   HCT 40.7 04/25/2015   PLT 208 04/25/2015   GLUCOSE 89 03/28/2015   CHOL 205 (H) 12/14/2014   TRIG 160.0 (H) 12/14/2014   HDL 55.60 12/14/2014   LDLDIRECT 142.6 07/09/2011   LDLCALC 118 (H) 12/14/2014   ALT 19 12/14/2014   AST 24 12/14/2014   NA 141 03/28/2015   K 4.3 03/28/2015   CL 106 03/28/2015   CREATININE 0.76 03/28/2015   BUN 16 03/28/2015   CO2 30 03/28/2015   TSH 0.90 03/28/2015   HGBA1C 5.6 10/12/2006    No results found.  Assessment & Plan:   There are no diagnoses linked to this encounter. I am having Ms. Mose maintain her NON FORMULARY, Cranberry, Valerian Root, Probiotic Product ([place]), Melatonin, multivitamin, Calcium  Carbonate-Vitamin D (CALCIUM + D PO), Lysine, zinc gluconate, [place] encephalitis, citalopram, levothyroxine, Biotin, diphenhydrAMINE, aspirin, docusate sodium, Astaxanthin, clobetasol, Clindamycin Phosphate, dextromethorphan-guaiFENesin, BREO ELLIPTA, cefdinir, erythromycin, traZODone, doxycycline, and mupirocin ointment.  No orders of the defined types were placed in this encounter.    Follow-up: No Follow-up on file.  Walker Kehr, MD

## 2015-12-17 NOTE — Assessment & Plan Note (Signed)
Citolopram

## 2015-12-17 NOTE — Progress Notes (Signed)
Pre visit review using our clinic review tool, if applicable. No additional management support is needed unless otherwise documented below in the visit note. 

## 2015-12-17 NOTE — Assessment & Plan Note (Signed)
On Citalopram 

## 2015-12-17 NOTE — Patient Instructions (Signed)
Follow-up for the bone density as scheduled. 

## 2015-12-17 NOTE — Assessment & Plan Note (Signed)
Labs

## 2015-12-17 NOTE — Assessment & Plan Note (Signed)
  On diet  

## 2015-12-17 NOTE — Progress Notes (Signed)
    Ellen [name] 04-13-1950 [id number]        65 y.o.  [id number] for annual exam.   Past medical history,surgical history, problem list, medications, allergies, family history and social history were all reviewed and documented as reviewed in the EPIC chart.  ROS:  Performed with pertinent positives and negatives included in the history, assessment and plan.   Additional significant findings :  None   Exam: Wandra Scot assistant Vitals:   12/17/15 1235  BP: 124/78  Weight: 163 lb (73.9 kg)  Height: 5\' 3"  (1.6 m)   Body mass index is 28.87 kg/m.  General appearance:  Normal affect, orientation and appearance. Skin: Grossly normal HEENT: Without gross lesions.  No cervical or supraclavicular adenopathy. Thyroid normal.  Lungs:  Clear without wheezing, rales or rhonchi Cardiac: RR, without RMG Abdominal:  Soft, nontender, without masses, guarding, rebound, organomegaly or hernia Breasts:  Examined lying and sitting without masses, retractions, discharge or axillary adenopathy. Pelvic:  Ext, BUS, Vagina with atrophic changes  Adnexa without masses or tenderness    Anus and perineum normal   Rectovaginal normal sphincter tone without palpated masses or tenderness.    Assessment/Plan:  65 y.o. [id number] female for annual exam.   1. Postmenopausal/atrophic genital changes. Status post TVH for bleeding 1991. Doing well without significant hot flushes, night sweats or vaginal dryness. 2. Mammography scheduled end of this month. SBE monthly reviewed. 3. DEXA 2012 normal. Schedule DEXA now at 5 year interval. 4. Pap smear 2015. No Pap smear done today. No history of significant abnormal Pap smears. 5. Colonoscopy 2011. Repeat at their recommended interval. 6. Health maintenance. No routine lab work done as patient does this elsewhere. Follow up 1 year, sooner as needed.   Anastasio Auerbach MD, 12:59 PM 12/17/2015

## 2015-12-17 NOTE — Assessment & Plan Note (Signed)

## 2016-01-03 DIAGNOSIS — Z1231 Encounter for screening mammogram for malignant neoplasm of breast: Secondary | ICD-10-CM | POA: Diagnosis not present

## 2016-01-03 LAB — HM MAMMOGRAPHY

## 2016-01-05 ENCOUNTER — Encounter: Payer: Self-pay | Admitting: Internal Medicine

## 2016-01-05 NOTE — Progress Notes (Signed)
Results abstracted and sent to scan.  

## 2016-01-07 DIAGNOSIS — M8580 Other specified disorders of bone density and structure, unspecified site: Secondary | ICD-10-CM

## 2016-01-07 HISTORY — DX: Other specified disorders of bone density and structure, unspecified site: M85.80

## 2016-01-09 ENCOUNTER — Other Ambulatory Visit: Payer: Self-pay | Admitting: Internal Medicine

## 2016-01-09 ENCOUNTER — Encounter: Payer: Self-pay | Admitting: Internal Medicine

## 2016-01-09 ENCOUNTER — Encounter: Payer: Self-pay | Admitting: Gynecology

## 2016-01-10 ENCOUNTER — Other Ambulatory Visit: Payer: Self-pay | Admitting: Internal Medicine

## 2016-01-10 MED ORDER — CITALOPRAM HYDROBROMIDE 20 MG PO TABS: 20.0000 mg | ORAL_TABLET | Freq: Every day | ORAL | 3 refills | Status: DC

## 2016-01-10 MED ORDER — TRAZODONE HCL 50 MG PO TABS: 50.0000 mg | ORAL_TABLET | Freq: Every evening | ORAL | 3 refills | Status: DC | PRN

## 2016-01-10 MED ORDER — LEVOTHYROXINE SODIUM 112 MCG PO TABS: 112.0000 ug | ORAL_TABLET | Freq: Every day | ORAL | 3 refills | Status: DC

## 2016-01-29 ENCOUNTER — Other Ambulatory Visit: Payer: Self-pay | Admitting: Gynecology

## 2016-01-29 ENCOUNTER — Encounter: Payer: Self-pay | Admitting: Gynecology

## 2016-01-29 ENCOUNTER — Ambulatory Visit (INDEPENDENT_AMBULATORY_CARE_PROVIDER_SITE_OTHER): Payer: Medicare Other

## 2016-01-29 DIAGNOSIS — M899 Disorder of bone, unspecified: Secondary | ICD-10-CM

## 2016-01-29 DIAGNOSIS — Z01411 Encounter for gynecological examination (general) (routine) with abnormal findings: Secondary | ICD-10-CM

## 2016-02-03 ENCOUNTER — Encounter: Payer: Self-pay | Admitting: Internal Medicine

## 2016-02-05 ENCOUNTER — Other Ambulatory Visit: Payer: Self-pay | Admitting: Internal Medicine

## 2016-02-05 DIAGNOSIS — M85859 Other specified disorders of bone density and structure, unspecified thigh: Secondary | ICD-10-CM

## 2016-02-26 ENCOUNTER — Ambulatory Visit (INDEPENDENT_AMBULATORY_CARE_PROVIDER_SITE_OTHER): Payer: Medicare Other | Admitting: Internal Medicine

## 2016-02-26 ENCOUNTER — Encounter: Payer: Self-pay | Admitting: Internal Medicine

## 2016-02-26 VITALS — BP 146/86 | HR 80 | Temp 98.7°F | Resp 16 | Ht 63.0 in | Wt 169.0 lb

## 2016-02-26 DIAGNOSIS — R509 Fever, unspecified: Secondary | ICD-10-CM | POA: Diagnosis not present

## 2016-02-26 DIAGNOSIS — B9789 Other viral agents as the cause of diseases classified elsewhere: Secondary | ICD-10-CM | POA: Diagnosis not present

## 2016-02-26 DIAGNOSIS — J069 Acute upper respiratory infection, unspecified: Secondary | ICD-10-CM | POA: Diagnosis not present

## 2016-02-26 LAB — POCT INFLUENZA A/B
Influenza A, POC: NEGATIVE
Influenza B, POC: NEGATIVE

## 2016-02-26 NOTE — Progress Notes (Signed)
Pre-visit discussion using our clinic review tool. No additional management support is needed unless otherwise documented below in the visit note.  

## 2016-02-26 NOTE — Patient Instructions (Signed)
Use over-the-counter  "cold" medicines  such as "Afrin" nasal spray for nasal congestion as directed instead. Use" Delsym" or" Robitussin" cough syrup varietis for cough.  You can use plain "Tylenol" or "Advil" for fever, chills and achyness. Use Halls or Ricola cough drops.   "Common cold" symptoms are usually triggered by a virus.  The antibiotics are usually not necessary. On average, a" viral cold" illness would take 4-7 days to resolve. Please, make an appointment if you are not better or if you're worse.  

## 2016-02-26 NOTE — Assessment & Plan Note (Signed)
Flu (-) Prom-cod syr Rx OTC meds Call if worse

## 2016-02-26 NOTE — Progress Notes (Signed)
Subjective:  Patient ID: Ellen [name], female    DOB: February 29, 1950  Age: 66 y.o. MRN: [id number]  CC: Fever (COUGH, CHILLS, SOB 5 DAYS)   HPI Ellen [name] presents for URI sx's x 5 d  Outpatient Medications Prior to Visit  Medication Sig Dispense Refill  . aspirin 81 MG tablet Take 81 mg by mouth daily.    . Astaxanthin 5 MG CAPS Take by mouth. Reported on 06/07/2015    . Calcium Carbonate-Vitamin D (CALCIUM + D PO) Take by mouth.    . citalopram (CELEXA) 20 MG tablet Take 1 tablet (20 mg total) by mouth daily. 90 tablet 3  . Cranberry 1000 MG CAPS Take 1 capsule by mouth daily. Reported on 06/07/2015    . dextromethorphan-guaiFENesin (MUCINEX DM) 30-600 MG 12hr tablet Take 1 tablet by mouth 2 (two) times daily as needed. Reported on 06/07/2015    . diphenhydrAMINE (SOMINEX) 25 MG tablet Take 25 mg by mouth at bedtime as needed for sleep. Reported on 06/07/2015    . docusate sodium (COLACE) 100 MG capsule Take 100 mg by mouth 2 (two) times daily.    [name] levothyroxine (SYNTHROID, LEVOTHROID) 112 MCG tablet Take 1 tablet (112 mcg total) by mouth daily. 90 tablet 3  . Melatonin 10 MG TABS Take 2 mg by mouth at bedtime and may repeat dose one time if needed.     . Multiple Vitamin (MULTIVITAMIN) tablet Take 1 tablet by mouth daily. Life's Abundance    . NON FORMULARY Allerfex  1 by mouth once daily as needed    . Probiotic Product ([place]) Take by mouth daily.    . traZODone (DESYREL) 50 MG tablet Take 1 tablet (50 mg total) by mouth at bedtime as needed. 90 tablet 3  . Valerian Root 100 MG CAPS Take 1 capsule by mouth daily.      [name] zinc gluconate 50 MG tablet Take 50 mg by mouth daily.     No facility-administered medications prior to visit.     ROS Review of Systems  Constitutional: Negative for activity change, appetite change, chills, fatigue and unexpected weight change.  HENT: Positive for congestion and rhinorrhea. Negative for mouth sores and sinus pressure.   Eyes:  Negative for visual disturbance.  Respiratory: Positive for cough. Negative for chest tightness.   Gastrointestinal: Negative for abdominal pain and nausea.  Genitourinary: Negative for difficulty urinating, frequency and vaginal pain.  Musculoskeletal: Negative for back pain and gait problem.  Skin: Negative for pallor and rash.  Neurological: Negative for dizziness, tremors, weakness, numbness and headaches.  Psychiatric/Behavioral: Negative for confusion and sleep disturbance.    Objective:  BP (!) 146/86   Pulse 80   Temp 98.7 F (37.1 C) (Oral)   Resp 16   Ht 5\' 3"  (1.6 m)   Wt 169 lb (76.7 kg)   SpO2 97%   BMI 29.94 kg/m   BP Readings from Last 3 Encounters:  02/26/16 (!) 146/86  12/17/15 124/78  12/17/15 118/82    Wt Readings from Last 3 Encounters:  02/26/16 169 lb (76.7 kg)  12/17/15 163 lb (73.9 kg)  12/17/15 163 lb (73.9 kg)    Physical Exam  Constitutional: She appears well-developed. No distress.  HENT:  Head: Normocephalic.  Right Ear: External ear normal.  Left Ear: External ear normal.  Nose: Nose normal.  Mouth/Throat: Oropharynx is clear and moist.  Eyes: Conjunctivae are normal. Pupils are equal, round, and reactive to light. Right  eye exhibits no discharge. Left eye exhibits no discharge.  Neck: Normal range of motion. Neck supple. No JVD present. No tracheal deviation present. No thyromegaly present.  Cardiovascular: Normal rate, regular rhythm and normal heart sounds.   Pulmonary/Chest: No stridor. No respiratory distress. She has no wheezes.  Abdominal: Soft. Bowel sounds are normal. She exhibits no distension and no mass. There is no tenderness. There is no rebound and no guarding.  Musculoskeletal: She exhibits no edema or tenderness.  Lymphadenopathy:    She has no cervical adenopathy.  Neurological: She displays normal reflexes. No cranial nerve deficit. She exhibits normal muscle tone. Coordination normal.  Skin: No rash noted. No  erythema.  Psychiatric: She has a normal mood and affect. Her behavior is normal. Judgment and thought content normal.  eryth throat  Lab Results  Component Value Date   WBC 7.3 12/17/2015   HGB 14.0 12/17/2015   HCT 41.9 12/17/2015   PLT 227.0 12/17/2015   GLUCOSE 94 12/17/2015   CHOL 230 (H) 12/17/2015   TRIG 181.0 (H) 12/17/2015   HDL 73.20 12/17/2015   LDLDIRECT 142.6 07/09/2011   LDLCALC 121 (H) 12/17/2015   ALT 17 12/17/2015   AST 20 12/17/2015   NA 141 12/17/2015   K 4.2 12/17/2015   CL 104 12/17/2015   CREATININE 0.71 12/17/2015   BUN 18 12/17/2015   CO2 29 12/17/2015   TSH 3.05 12/17/2015   HGBA1C 5.6 10/12/2006    No results found.  Assessment & Plan:   Thavy was seen today for fever.  Diagnoses and all orders for this visit:  Fever, unspecified fever cause -     POCT Influenza A/B   I am having Ms. Gabbert maintain her NON FORMULARY, Cranberry, Valerian Root, Probiotic Product ([place] PO), Melatonin, multivitamin, Calcium Carbonate-Vitamin D (CALCIUM + D PO), zinc gluconate, diphenhydrAMINE, aspirin, docusate sodium, Astaxanthin, dextromethorphan-guaiFENesin, citalopram, levothyroxine, and traZODone.  No orders of the defined types were placed in this encounter.    Follow-up: No Follow-up on file.  Walker Kehr, MD

## 2016-03-04 ENCOUNTER — Other Ambulatory Visit (INDEPENDENT_AMBULATORY_CARE_PROVIDER_SITE_OTHER): Payer: Medicare Other

## 2016-03-04 DIAGNOSIS — M85859 Other specified disorders of bone density and structure, unspecified thigh: Secondary | ICD-10-CM

## 2016-03-04 LAB — VITAMIN D 25 HYDROXY (VIT D DEFICIENCY, FRACTURES): VITD: 33.07 ng/mL (ref 30.00–100.00)

## 2016-03-05 ENCOUNTER — Encounter: Payer: Self-pay | Admitting: Internal Medicine

## 2016-03-06 ENCOUNTER — Other Ambulatory Visit: Payer: Self-pay | Admitting: Internal Medicine

## 2016-03-06 MED ORDER — VITAMIN D3 50 MCG (2000 UT) PO CAPS: 2000.0000 [IU] | ORAL_CAPSULE | Freq: Every day | ORAL | 3 refills | Status: DC

## 2016-03-12 DIAGNOSIS — L02222 Furuncle of back [any part, except buttock]: Secondary | ICD-10-CM | POA: Diagnosis not present

## 2016-03-12 DIAGNOSIS — L820 Inflamed seborrheic keratosis: Secondary | ICD-10-CM | POA: Diagnosis not present

## 2016-03-12 DIAGNOSIS — D225 Melanocytic nevi of trunk: Secondary | ICD-10-CM | POA: Diagnosis not present

## 2016-03-18 ENCOUNTER — Encounter: Payer: Self-pay | Admitting: Internal Medicine

## 2016-03-18 NOTE — Progress Notes (Signed)
[facility] skin biopsy abstracted

## 2016-03-25 ENCOUNTER — Other Ambulatory Visit: Payer: Self-pay | Admitting: Internal Medicine

## 2016-03-27 DIAGNOSIS — E039 Hypothyroidism, unspecified: Secondary | ICD-10-CM | POA: Diagnosis not present

## 2016-03-27 DIAGNOSIS — E559 Vitamin D deficiency, unspecified: Secondary | ICD-10-CM | POA: Diagnosis not present

## 2016-03-27 DIAGNOSIS — D721 Eosinophilia: Secondary | ICD-10-CM | POA: Diagnosis not present

## 2016-03-27 DIAGNOSIS — Z92241 Personal history of systemic steroid therapy: Secondary | ICD-10-CM | POA: Diagnosis not present

## 2016-03-31 DIAGNOSIS — E559 Vitamin D deficiency, unspecified: Secondary | ICD-10-CM | POA: Diagnosis not present

## 2016-03-31 DIAGNOSIS — M2550 Pain in unspecified joint: Secondary | ICD-10-CM | POA: Diagnosis not present

## 2016-03-31 DIAGNOSIS — E039 Hypothyroidism, unspecified: Secondary | ICD-10-CM | POA: Diagnosis not present

## 2016-03-31 DIAGNOSIS — E038 Other specified hypothyroidism: Secondary | ICD-10-CM | POA: Diagnosis not present

## 2016-03-31 DIAGNOSIS — L658 Other specified nonscarring hair loss: Secondary | ICD-10-CM | POA: Diagnosis not present

## 2016-03-31 DIAGNOSIS — L659 Nonscarring hair loss, unspecified: Secondary | ICD-10-CM | POA: Diagnosis not present

## 2016-04-16 ENCOUNTER — Encounter: Payer: Self-pay | Admitting: Internal Medicine

## 2016-04-17 DIAGNOSIS — Z92241 Personal history of systemic steroid therapy: Secondary | ICD-10-CM | POA: Diagnosis not present

## 2016-04-17 DIAGNOSIS — E039 Hypothyroidism, unspecified: Secondary | ICD-10-CM | POA: Diagnosis not present

## 2016-04-18 ENCOUNTER — Encounter: Payer: Self-pay | Admitting: Nurse Practitioner

## 2016-04-18 ENCOUNTER — Other Ambulatory Visit: Payer: Medicare Other

## 2016-04-18 ENCOUNTER — Ambulatory Visit (INDEPENDENT_AMBULATORY_CARE_PROVIDER_SITE_OTHER): Payer: Medicare Other | Admitting: Nurse Practitioner

## 2016-04-18 VITALS — BP 122/88 | HR 69 | Temp 98.4°F | Ht 63.0 in | Wt 164.0 lb

## 2016-04-18 DIAGNOSIS — R300 Dysuria: Secondary | ICD-10-CM

## 2016-04-18 LAB — POCT URINALYSIS DIPSTICK
Bilirubin, UA: NEGATIVE
Blood, UA: NEGATIVE
Glucose, UA: NEGATIVE
Ketones, UA: NEGATIVE
Leukocytes, UA: NEGATIVE
Nitrite, UA: NEGATIVE
Protein, UA: NEGATIVE
Spec Grav, UA: <=1.005 — AB (ref 1.010–1.025)
Urobilinogen, UA: 0.2 E.U./dL
pH, UA: 6 (ref 5.0–8.0)

## 2016-04-18 MED ORDER — NITROFURANTOIN MONOHYD MACRO 100 MG PO CAPS: 100.0000 mg | ORAL_CAPSULE | Freq: Two times a day (BID) | ORAL | 0 refills | Status: AC

## 2016-04-18 NOTE — Progress Notes (Signed)
Subjective:  Patient ID: Ellen [name], female    DOB: [birth year]  Age: 66 y.o. MRN: [id number]  CC: Urinary Tract Infection (burning,back pain going 3 days. )   Dysuria   This is a new problem. The current episode started in the past 7 days. The problem occurs every urination. The problem has been unchanged. The quality of the pain is described as burning. There has been no fever. She is not sexually active. There is no history of pyelonephritis. Associated symptoms include frequency and urgency. Pertinent negatives include no chills, discharge, flank pain, hematuria, hesitancy, nausea, possible pregnancy, sweats or vomiting. She has tried increased fluids for the symptoms. Her past medical history is significant for recurrent UTIs and urinary stasis.    Outpatient Medications Prior to Visit  Medication Sig Dispense Refill  . aspirin 81 MG tablet Take 81 mg by mouth daily.    . Calcium Carbonate-Vitamin D (CALCIUM + D PO) Take by mouth.    . Cholecalciferol (VITAMIN D3) 2000 units capsule Take 1 capsule (2,000 Units total) by mouth daily. 100 capsule 3  . citalopram (CELEXA) 20 MG tablet Take 1 tablet (20 mg total) by mouth daily. 90 tablet 3  . Cranberry 1000 MG CAPS Take 1 capsule by mouth daily. Reported on 06/07/2015    . dextromethorphan-guaiFENesin (MUCINEX DM) 30-600 MG 12hr tablet Take 1 tablet by mouth 2 (two) times daily as needed. Reported on 06/07/2015    . diphenhydrAMINE (SOMINEX) 25 MG tablet Take 25 mg by mouth at bedtime as needed for sleep. Reported on 06/07/2015    . docusate sodium (COLACE) 100 MG capsule Take 100 mg by mouth 2 (two) times daily.    . Multiple Vitamin (MULTIVITAMIN) tablet Take 1 tablet by mouth daily. Life's Abundance    . mupirocin ointment (BACTROBAN) 2 % USE TWICE DAILY 22 g 0  . NON FORMULARY Allerfex  1 by mouth once daily as needed    . Probiotic Product ([place]) Take by mouth daily.    . traZODone (DESYREL) 50 MG tablet Take 1  tablet (50 mg total) by mouth at bedtime as needed. 90 tablet 3  . Valerian Root 100 MG CAPS Take 1 capsule by mouth daily.      [name] zinc gluconate 50 MG tablet Take 50 mg by mouth daily.    . Astaxanthin 5 MG CAPS Take by mouth. Reported on 06/07/2015    . levothyroxine (SYNTHROID, LEVOTHROID) 112 MCG tablet Take 1 tablet (112 mcg total) by mouth daily. (Patient not taking: Reported on 04/18/2016) 90 tablet 3  . Melatonin 10 MG TABS Take 2 mg by mouth at bedtime and may repeat dose one time if needed.      No facility-administered medications prior to visit.     ROS See HPI  Objective:  BP 122/88   Pulse 69   Temp 98.4 F (36.9 C)   Ht 5\' 3"  (1.6 m)   Wt 164 lb (74.4 kg)   SpO2 98%   BMI 29.05 kg/m   BP Readings from Last 3 Encounters:  04/18/16 122/88  02/26/16 (!) 146/86  12/17/15 124/78    Wt Readings from Last 3 Encounters:  04/18/16 164 lb (74.4 kg)  02/26/16 169 lb (76.7 kg)  12/17/15 163 lb (73.9 kg)    Physical Exam  Constitutional: She is oriented to person, place, and time.  Cardiovascular: Normal rate.   Pulmonary/Chest: Effort normal.  Abdominal: Soft. She exhibits no distension. There is  tenderness.  Suprapubic tenderness  Neurological: She is alert and oriented to person, place, and time.  Skin: Skin is warm and dry.  Vitals reviewed.   Lab Results  Component Value Date   WBC 7.3 12/17/2015   HGB 14.0 12/17/2015   HCT 41.9 12/17/2015   PLT 227.0 12/17/2015   GLUCOSE 94 12/17/2015   CHOL 230 (H) 12/17/2015   TRIG 181.0 (H) 12/17/2015   HDL 73.20 12/17/2015   LDLDIRECT 142.6 07/09/2011   LDLCALC 121 (H) 12/17/2015   ALT 17 12/17/2015   AST 20 12/17/2015   NA 141 12/17/2015   K 4.2 12/17/2015   CL 104 12/17/2015   CREATININE 0.71 12/17/2015   BUN 18 12/17/2015   CO2 29 12/17/2015   TSH 3.05 12/17/2015   HGBA1C 5.6 10/12/2006    No results found.  Assessment & Plan:   Ellen [name] was seen today for urinary tract infection.  Diagnoses and  all orders for this visit:  Dysuria -     POCT urinalysis dipstick -     Urine culture; Future -     nitrofurantoin, macrocrystal-monohydrate, (MACROBID) 100 MG capsule; Take 1 capsule (100 mg total) by mouth 2 (two) times daily.   I am having Ellen [name] start on nitrofurantoin (macrocrystal-monohydrate). I am also having her maintain her NON FORMULARY, Cranberry, Valerian Root, Probiotic Product ([place]), Melatonin, multivitamin, Calcium Carbonate-Vitamin D (CALCIUM + D PO), zinc gluconate, diphenhydrAMINE, aspirin, docusate sodium, Astaxanthin, dextromethorphan-guaiFENesin, citalopram, levothyroxine, traZODone, Vitamin D3, mupirocin ointment, levothyroxine, and NON FORMULARY.  Meds ordered this encounter  Medications  . levothyroxine (SYNTHROID, LEVOTHROID) 125 MCG tablet    Sig: Take 125 mcg by mouth 1 day or 1 dose.  . NON FORMULARY  . nitrofurantoin, macrocrystal-monohydrate, (MACROBID) 100 MG capsule    Sig: Take 1 capsule (100 mg total) by mouth 2 (two) times daily.    Dispense:  6 capsule    Refill:  0    Order Specific Question:   Supervising Provider    Answer:   Cassandria Anger [[id number]]    Follow-up: Return if symptoms worsen or fail to improve.  Wilfred Lacy, NP

## 2016-04-18 NOTE — Progress Notes (Signed)
Pre visit review using our clinic review tool, if applicable. No additional management support is needed unless otherwise documented below in the visit note. 

## 2016-04-18 NOTE — Patient Instructions (Signed)
You will be called with urine culture results.  Continue adequate oral hydration and probiotic use.  Dysuria Dysuria is pain or discomfort while urinating. The pain or discomfort may be felt in the tube that carries urine out of the bladder (urethra) or in the surrounding tissue of the genitals. The pain may also be felt in the groin area, lower abdomen, and lower back. You may have to urinate frequently or have the sudden feeling that you have to urinate (urgency). Dysuria can affect both men and women, but is more common in women. Dysuria can be caused by many different things, including:  Urinary tract infection in women.  Infection of the kidney or bladder.  Kidney stones or bladder stones.  Certain sexually transmitted infections (STIs), such as chlamydia.  Dehydration.  Inflammation of the vagina.  Use of certain medicines.  Use of certain soaps or scented products that cause irritation. Follow these instructions at home: Watch your dysuria for any changes. The following actions may help to reduce any discomfort you are feeling:  Drink enough fluid to keep your urine clear or pale yellow.  Empty your bladder often. Avoid holding urine for long periods of time.  After a bowel movement or urination, women should cleanse from front to back, using each tissue only once.  Empty your bladder after sexual intercourse.  Take medicines only as directed by your health care provider.  If you were prescribed an antibiotic medicine, finish it all even if you start to feel better.  Avoid caffeine, tea, and alcohol. They can irritate the bladder and make dysuria worse. In men, alcohol may irritate the prostate.  Keep all follow-up visits as directed by your health care provider. This is important.  If you had any tests done to find the cause of dysuria, it is your responsibility to obtain your test results. Ask the lab or department performing the test when and how you will get your  results. Talk with your health care provider if you have any questions about your results. Contact a health care provider if:  You develop pain in your back or sides.  You have a fever.  You have nausea or vomiting.  You have blood in your urine.  You are not urinating as often as you usually do. Get help right away if:  You pain is severe and not relieved with medicines.  You are unable to hold down any fluids.  You or someone else notices a change in your mental function.  You have a rapid heartbeat at rest.  You have shaking or chills.  You feel extremely weak. This information is not intended to replace advice given to you by your health care provider. Make sure you discuss any questions you have with your health care provider. Document Released: 09/21/2003 Document Revised: 05/31/2015 Document Reviewed: 08/18/2013 Elsevier Interactive Patient Education  2017 [employer].

## 2016-04-19 LAB — URINE CULTURE

## 2016-04-21 ENCOUNTER — Encounter: Payer: Self-pay | Admitting: Nurse Practitioner

## 2016-05-22 DIAGNOSIS — E038 Other specified hypothyroidism: Secondary | ICD-10-CM | POA: Diagnosis not present

## 2016-07-16 ENCOUNTER — Encounter: Payer: Self-pay | Admitting: Internal Medicine

## 2016-07-20 ENCOUNTER — Encounter: Payer: Self-pay | Admitting: Internal Medicine

## 2016-07-21 MED ORDER — ZOLPIDEM TARTRATE 10 MG PO TABS: 10.0000 mg | ORAL_TABLET | Freq: Every evening | ORAL | 0 refills | Status: DC | PRN

## 2016-07-21 NOTE — Telephone Encounter (Signed)
Per previous email MD ok for rx to be called into pharmacy. Responded back to pt inform her rx will be called into [facility] this am. [facility] spoke w/pharmacy gave MD authorization for zolpidem...Johny Chess

## 2016-07-21 NOTE — Telephone Encounter (Signed)
Rx for zolpidem was called into [facility]. See email from 07/20/16...Johny Chess

## 2016-08-01 DIAGNOSIS — L300 Nummular dermatitis: Secondary | ICD-10-CM | POA: Diagnosis not present

## 2016-08-01 DIAGNOSIS — L820 Inflamed seborrheic keratosis: Secondary | ICD-10-CM | POA: Diagnosis not present

## 2016-08-01 DIAGNOSIS — L570 Actinic keratosis: Secondary | ICD-10-CM | POA: Diagnosis not present

## 2016-08-01 DIAGNOSIS — L814 Other melanin hyperpigmentation: Secondary | ICD-10-CM | POA: Diagnosis not present

## 2016-08-20 ENCOUNTER — Encounter: Payer: Self-pay | Admitting: Family Medicine

## 2016-08-20 ENCOUNTER — Ambulatory Visit (INDEPENDENT_AMBULATORY_CARE_PROVIDER_SITE_OTHER): Payer: Medicare Other | Admitting: Family Medicine

## 2016-08-20 VITALS — BP 122/80 | HR 61 | Temp 98.3°F | Ht 63.0 in | Wt 161.0 lb

## 2016-08-20 DIAGNOSIS — B9689 Other specified bacterial agents as the cause of diseases classified elsewhere: Secondary | ICD-10-CM | POA: Insufficient documentation

## 2016-08-20 DIAGNOSIS — J329 Chronic sinusitis, unspecified: Secondary | ICD-10-CM

## 2016-08-20 MED ORDER — AMOXICILLIN-POT CLAVULANATE 875-125 MG PO TABS: 1.0000 | ORAL_TABLET | Freq: Two times a day (BID) | ORAL | 0 refills | Status: DC

## 2016-08-20 NOTE — Progress Notes (Addendum)
Ellen [name] - 66 y.o. female MRN [id number]  Date of birth: 1950-11-14  SUBJECTIVE:  Including CC & ROS.  Chief Complaint  Patient presents with  . Sinusitis    X2 weeks ear pain off and on yesterday and day before was really bad pressure with teeth pain. patient states going to see mother in assisted living and does not want to go in sick   Ellen [name] is a 66 year old female is presenting with sinus congestion and pain. She reports the symptoms started about 2 weeks ago. She is having frontal and maxillary sinus pressure. She is also having ear pain. She had a fever of 101 yesterday. She does feel somewhat better today. She denies any specific sick contacts. She is traveling to see her mother in a nursing facility this weekend. She's tried several over-the-counter remedies such as a Nettie pot and other cold medications. She reports having in her mental allergies to ragweed. Denies any shortness of breath or chest pain. She feels like overall her symptoms are not improved over the course of the 2 weeks.     Review of Systems  Constitutional: Positive for fever.  HENT: Positive for ear pain, sinus pain and sinus pressure.   Eyes: Negative for pain.  Respiratory: Negative for shortness of breath.   Cardiovascular: Negative for chest pain.  Gastrointestinal: Negative for nausea and vomiting.  Musculoskeletal: Negative for gait problem.  Allergic/Immunologic: Positive for environmental allergies.  Neurological: Positive for headaches.  Hematological: Negative for adenopathy.   otherwise negative  HISTORY: Past Medical, Surgical, Social, and Family History Reviewed & Updated per EMR.   Pertinent Historical Findings include:  Past Medical History:  Diagnosis Date  . ALLERGIC RHINITIS   . ANXIETY   . GERD   . INSOMNIA-SLEEP DISORDER-UNSPEC   . Osteopenia 01/2016   T score -1.2 FRAX 15%/0.9%  . Thyroid disease     Past Surgical History:  Procedure Laterality Date  . ANKLE FRACTURE  SURGERY  2012   R  . APPENDECTOMY    . BREAST SURGERY     REDUCTION MAMMOPLASTY  . CESAREAN SECTION     twice  . COSMETIC SURGERY    . FACELIFT     s/p  . TONSILLECTOMY  1974  . Upper nl TSH w/sx    . VAGINAL HYSTERECTOMY  1991   TVH, irregular bleeding    Allergies  Allergen Reactions  . Belviq [Lorcaserin Hcl]     palpitations  . Demerol [Meperidine]     palpitations    Family History  Problem Relation Age of Onset  . Heart disease Father        CAD/CABG in his 33's  . Hypertension Mother   . Heart failure Mother      Social History   Social History  . Marital status: Married    Spouse name: N/A  . Number of children: 2  . Years of education: N/A   Occupational History  . Retired Sports coach Employed   Social History Main Topics  . Smoking status: Former Research scientist (life sciences)  . Smokeless tobacco: Never Used  . Alcohol use 0.0 oz/week     Comment: Rare  . Drug use: No  . Sexual activity: Yes    Birth control/ protection: Post-menopausal, Surgical     Comment: HYST-1st intercourse 66 yo-Fewer than 5 partners   Other Topics Concern  . Not on file   Social History Narrative   Regular exercise   2 biol children-1 disabled with schizophrenia &  1 stepchild     PHYSICAL EXAM:  VS: BP 122/80 (BP Location: Left Arm, Patient Position: Sitting, Cuff Size: Normal)   Pulse 61   Temp 98.3 F (36.8 C) (Oral)   Ht 5\' 3"  (1.6 m)   Wt 161 lb (73 kg)   SpO2 99%   BMI 28.52 kg/m  Physical Exam Gen: NAD, alert, cooperative with exam, well-appearing ENT: normal lips, normal nasal mucosa, tympanic members clear and intact bilaterally, tenderness to the frontal and maxillary sinuses. Eye: normal EOM, normal conjunctiva and lids CV:  no edema, +2 pedal pulses, S1-S2, regular rate   Resp: no accessory muscle use, non-labored, clear to auscultation bilaterally Skin: no rashes, no areas of induration  Neuro: normal tone, normal sensation to touch Psych:  normal insight, alert and  oriented MSK: Normal gait and normal strength      ASSESSMENT & PLAN:   Bacterial sinusitis Symptoms seem to be bacterial in nature. She is having teeth pain and had fevers last night. She seems to have 1 exacerbation per year. She does have environmental allergies that are chronic. She has seen an ENT doctor - Start Augmentin for a ten-day course - Encourage supportive measures as well. - Follow-up as needed.

## 2016-08-20 NOTE — Assessment & Plan Note (Addendum)
Symptoms seem to be bacterial in nature. She is having teeth pain and had fevers last night. She seems to have 1 exacerbation per year. She does have environmental allergies that are chronic. She has seen an ENT doctor - Start Augmentin for a ten-day course - Encourage supportive measures as well. - Follow-up as needed.

## 2016-08-20 NOTE — Patient Instructions (Addendum)
Thank you for coming in,   I provided antibiotic free today. Please let me know if her symptoms do not improve.   Please feel free to call with any questions or concerns at any time, at [phone]. --Dr. Raeford Razor

## 2016-08-25 DIAGNOSIS — H524 Presbyopia: Secondary | ICD-10-CM | POA: Diagnosis not present

## 2016-09-08 ENCOUNTER — Encounter: Payer: Self-pay | Admitting: Family Medicine

## 2016-09-09 MED ORDER — AMOXICILLIN-POT CLAVULANATE 875-125 MG PO TABS: 1.0000 | ORAL_TABLET | Freq: Two times a day (BID) | ORAL | 0 refills | Status: DC

## 2016-09-11 ENCOUNTER — Ambulatory Visit: Payer: Medicare Other

## 2016-09-16 ENCOUNTER — Ambulatory Visit (INDEPENDENT_AMBULATORY_CARE_PROVIDER_SITE_OTHER): Payer: Medicare Other | Admitting: General Practice

## 2016-09-16 DIAGNOSIS — Z23 Encounter for immunization: Secondary | ICD-10-CM

## 2016-10-02 ENCOUNTER — Other Ambulatory Visit: Payer: Self-pay

## 2016-10-02 ENCOUNTER — Telehealth: Payer: Self-pay

## 2016-10-02 MED ORDER — ZOLPIDEM TARTRATE 10 MG PO TABS: 10.0000 mg | ORAL_TABLET | Freq: Every evening | ORAL | 0 refills | Status: DC | PRN

## 2016-10-02 NOTE — Telephone Encounter (Signed)
Rec'd fax from [facility] for Zolpidem, Patient had an appt 01/01/17 with Dr. Alain Marion, please advise in his absence.

## 2016-10-02 NOTE — Telephone Encounter (Signed)
Faxed to [facility] on [address]

## 2016-10-11 NOTE — Progress Notes (Signed)
[name] [address], [place] [address] Phone: [phone] Subjective:    I'm seeing this patient by the request  of:    CC: Right arm pain  NID:[id number]  Ellen [name] is a 66 y.o. female coming in with complaint of right arm pain. Her pain started 6 weeks ago. She believes that her pain may be coming from chores and laptop use. The pain starts in the shoulder and can radiate down into the wrist. She is using turmeric and tart cherry.  Continues him pain. Radiates down. Sometimes associated with some neck pain. Patient denies any weakness. States that certain movements of, severe pain. Can wake her up at night. Rates the severity of pain a 7-8 out of 10      Past Medical History:  Diagnosis Date  . ALLERGIC RHINITIS   . ANXIETY   . GERD   . INSOMNIA-SLEEP DISORDER-UNSPEC   . Osteopenia 01/2016   T score -1.2 FRAX 15%/0.9%  . Thyroid disease    Past Surgical History:  Procedure Laterality Date  . ANKLE FRACTURE SURGERY  2012   R  . APPENDECTOMY    . BREAST SURGERY     REDUCTION MAMMOPLASTY  . CESAREAN SECTION     twice  . COSMETIC SURGERY    . FACELIFT     s/p  . TONSILLECTOMY  1974  . Upper nl TSH w/sx    . VAGINAL HYSTERECTOMY  1991   TVH, irregular bleeding   Social History   Social History  . Marital status: Married    Spouse name: N/A  . Number of children: 2  . Years of education: N/A   Occupational History  . Retired Sports coach Employed   Social History Main Topics  . Smoking status: Former Research scientist (life sciences)  . Smokeless tobacco: Never Used  . Alcohol use 0.0 oz/week     Comment: Rare  . Drug use: No  . Sexual activity: Yes    Birth control/ protection: Post-menopausal, Surgical     Comment: HYST-1st intercourse 66 yo-Fewer than 5 partners   Other Topics Concern  . Not on file   Social History Narrative   Regular exercise   2 biol children-1 disabled with schizophrenia & 1 stepchild   Allergies  Allergen  Reactions  . Belviq [Lorcaserin Hcl]     palpitations  . Demerol [Meperidine]     palpitations   Family History  Problem Relation Age of Onset  . Heart disease Father        CAD/CABG in his 41's  . Hypertension Mother   . Heart failure Mother      Past medical history, social, surgical and family history all reviewed in electronic medical record.  No pertanent information unless stated regarding to the chief complaint.   Review of Systems:Review of systems updated and as accurate as of 10/11/16  No headache, visual changes, nausea, vomiting, diarrhea, constipation, dizziness, abdominal pain, skin rash, fevers, chills, night sweats, weight loss, swollen lymph nodes, body aches, joint swelling, muscle aches, chest pain, shortness of breath, mood changes.   Objective  There were no vitals taken for this visit. Systems examined below as of 10/11/16   General: No apparent distress alert and oriented x3 mood and affect normal, dressed appropriately.  HEENT: Pupils equal, extraocular movements intact  Respiratory: Patient's speak in full sentences and does not appear short of breath  Cardiovascular: No lower extremity edema, non tender, no erythema  Skin: Warm dry  intact with no signs of infection or rash on extremities or on axial skeleton.  Abdomen: Soft nontender  Neuro: Cranial nerves II through XII are intact, neurovascularly intact in all extremities with 2+ DTRs and 2+ pulses.  Lymph: No lymphadenopathy of posterior or anterior cervical chain or axillae bilaterally.  Gait normal with good balance and coordination.  MSK:  Non tender with full range of motion and good stability and symmetric strength and tone of  elbows, wrist, hip, knee and ankles bilaterally.  Shoulder: Right Inspection reveals no abnormalities, atrophy or asymmetry. Palpation is normal with no tenderness over AC joint or bicipital groove. ROM is probably loss and external and internal range of motion Rotator  cuff strength normal throughout. signs of impingement with positive Neer and Hawkin's tests, but negative empty can sign. Speeds and Yergason's tests positive. Positive O'Brien's Normal scapular function observed. No painful arc and no drop arm sign. No apprehension sign  MSK US performed of: Right This study was ordered, performed, and interpreted by Charlann Boxer D.O.  Shoulder:   Supraspinatus:  Appears mild degenerative on long and transverse views, Bursal bulge seen with shoulder abduction on impingement view  Subscapularis:  Appears normal on long and transverse views. Positive bursa Teres Minor:  Appears normal on long and transverse views. AC joint:  Mild arthritis Glenohumeral Joint:  Appears normal without effusion. Glenoid Labrum:  Intact without visualized tears. Biceps Tendon:  Appears normal on long and transverse views, no fraying of tendon, tendon located in intertubercular groove, no subluxation with shoulder internal or external rotation.  Impression: Subacromial bursitis, mild arthritis  Procedure: Real-time Ultrasound Guided Injection of right glenohumeral joint Device: GE Logiq E  Ultrasound guided injection is preferred based studies that show increased duration, increased effect, greater accuracy, decreased procedural pain, increased response rate with ultrasound guided versus blind injection.  Verbal informed consent obtained.  Time-out conducted.  Noted no overlying erythema, induration, or other signs of local infection.  Skin prepped in a sterile fashion.  Local anesthesia: Topical Ethyl chloride.  With sterile technique and under real time ultrasound guidance:  Joint visualized.  23g 1  inch needle inserted posterior approach. Pictures taken for needle placement. Patient did have injection of 2 cc of 1% lidocaine, 2 cc of 0.5% Marcaine, and 1.0 cc of Kenalog 40 mg/dL. Completed without difficulty  Pain immediately resolved suggesting accurate placement of the  medication.  Advised to call if fevers/chills, erythema, induration, drainage, or persistent bleeding.  Images permanently stored and available for review in the ultrasound unit.  Impression: Technically successful ultrasound guided injection.     Impression and Recommendations:     This case required medical decision making of moderate complexity.      Note: This dictation was prepared with Dragon dictation along with smaller phrase technology. Any transcriptional errors that result from this process are unintentional.

## 2016-10-13 ENCOUNTER — Encounter: Payer: Self-pay | Admitting: Family Medicine

## 2016-10-13 ENCOUNTER — Ambulatory Visit (INDEPENDENT_AMBULATORY_CARE_PROVIDER_SITE_OTHER): Payer: Medicare Other | Admitting: Family Medicine

## 2016-10-13 ENCOUNTER — Ambulatory Visit: Payer: Self-pay

## 2016-10-13 VITALS — BP 128/82 | HR 68

## 2016-10-13 DIAGNOSIS — M79601 Pain in right arm: Secondary | ICD-10-CM

## 2016-10-13 DIAGNOSIS — M75101 Unspecified rotator cuff tear or rupture of right shoulder, not specified as traumatic: Secondary | ICD-10-CM

## 2016-10-13 NOTE — Patient Instructions (Addendum)
Good to see you  Ice 20 minutes 2 times daily. Usually after activity and before bed. Exercises 3 times a week.  Keep hands within peripheral vision.  You should do well  See me again in 4 weeks.

## 2016-10-13 NOTE — Assessment & Plan Note (Signed)
Patient was given an injection. Tolerated the procedure well. We discussed icing regimen and home exercises. We discussed which activities to do an which was to avoid. Patient is going to start increasing activity slowly over the course of next several weeks. Patient will follow-up with me again in 3-4 weeks. Could be a candidate for formal physical therapy.

## 2016-11-08 NOTE — Progress Notes (Signed)
[name] Ellen [name],  [id number] Phone: [phone] Subjective:    I'm seeing this patient by the request  of:  Plotnikov, Evie Lacks, MD   CC: Shoulder pain follow-up  NIO:[id number]  Ellen [name] is a 66 y.o. female coming in with complaint of shoulder pain. Found to have more of a rotator cuff syndrome likely secondary to a bursitis and some underlying arthritis. Given injection 1 month ago and was to do conservative therapy including home exercises. Patient states 95% better at this time.  She was also recently seen for what could have been a bacterial sinusitis.  Patient most of the pain seems to be better but still feels a fullness in her right ear.  Patient has seen ENT previously.  Patient is wondering if this could be potentially checked out.    Past Medical History:  Diagnosis Date  . ALLERGIC RHINITIS   . ANXIETY   . GERD   . INSOMNIA-SLEEP DISORDER-UNSPEC   . Osteopenia 01/2016   T score -1.2 FRAX 15%/0.9%  . Thyroid disease    Past Surgical History:  Procedure Laterality Date  . ANKLE FRACTURE SURGERY  2012   R  . APPENDECTOMY    . BREAST SURGERY     REDUCTION MAMMOPLASTY  . CESAREAN SECTION     twice  . COSMETIC SURGERY    . FACELIFT     s/p  . TONSILLECTOMY  1974  . Upper nl TSH w/sx    . VAGINAL HYSTERECTOMY  1991   TVH, irregular bleeding   Social History   Socioeconomic History  . Marital status: Married    Spouse name: None  . Number of children: 2  . Years of education: None  . Highest education level: None  Social Needs  . Financial resource strain: None  . Food insecurity - worry: None  . Food insecurity - inability: None  . Transportation needs - medical: None  . Transportation needs - non-medical: None  Occupational History  . Occupation: Retired    Fish farm manager: SELF EMPLOYED  Tobacco Use  . Smoking status: Former Research scientist (life sciences)  . Smokeless tobacco: Never Used  Substance and Sexual Activity    . Alcohol use: Yes    Alcohol/week: 0.0 oz    Comment: Rare  . Drug use: No  . Sexual activity: Yes    Birth control/protection: Post-menopausal, Surgical    Comment: HYST-1st intercourse 66 yo-Fewer than 5 partners  Other Topics Concern  . None  Social History Narrative   Regular exercise   2 biol children-1 disabled with schizophrenia & 1 stepchild   Allergies  Allergen Reactions  . Belviq [Lorcaserin Hcl]     palpitations  . Demerol [Meperidine]     palpitations   Family History  Problem Relation Age of Onset  . Heart disease Father        CAD/CABG in his 22's  . Hypertension Mother   . Heart failure Mother      Past medical history, social, surgical and family history all reviewed in electronic medical record.  No pertanent information unless stated regarding to the chief complaint.   Review of Systems:Review of systems updated and as accurate as of 11/10/16  No  visual changes, nausea, vomiting, diarrhea, constipation, dizziness, abdominal pain, skin rash, fevers, chills, night sweats, weight loss, swollen lymph nodes, body aches, joint swelling, muscle aches, chest pain, shortness of breath, mood changes.  Mild positive headache  Objective  Blood pressure  130/80, pulse 68, height 5\' 3"  (1.6 m), weight 156 lb (70.8 kg), SpO2 97 %. Systems examined below as of 11/10/16   General: No apparent distress alert and oriented x3 mood and affect normal, dressed appropriately.  HEENT: Pupils equal, extraocular movements intact patient's right eardrum shows significant air-fluid level but no retraction or area of erythema. Respiratory: Patient's speak in full sentences and does not appear short of breath  Cardiovascular: No lower extremity edema, non tender, no erythema  Skin: Warm dry intact with no signs of infection or rash on extremities or on axial skeleton.  Abdomen: Soft nontender  Neuro: Cranial nerves II through XII are intact, neurovascularly intact in all  extremities with 2+ DTRs and 2+ pulses.  Lymph: No lymphadenopathy of posterior or anterior cervical chain or axillae bilaterally.  Gait normal with good balance and coordination.  MSK:  Non tender with full range of motion and good stability and symmetric strength and tone of  elbows, wrist, hip, knee and ankles bilaterally.  Shoulder: Right Inspection reveals no abnormalities, atrophy or asymmetry. Palpation is normal with no tenderness over AC joint or bicipital groove. ROM is full in all planes. Rotator cuff strength normal throughout. Positive impingement Speeds and Yergason's tests normal. No labral pathology noted with negative Obrien's, negative clunk and good stability. Normal scapular function observed. No painful arc and no drop arm sign. No apprehension sign Contralateral shoulder unremarkable       Impression and Recommendations:     This case required medical decision making of moderate complexity.      Note: This dictation was prepared with Dragon dictation along with smaller phrase technology. Any transcriptional errors that result from this process are unintentional.

## 2016-11-10 ENCOUNTER — Ambulatory Visit: Payer: Medicare Other | Admitting: Family Medicine

## 2016-11-10 ENCOUNTER — Encounter: Payer: Self-pay | Admitting: Family Medicine

## 2016-11-10 DIAGNOSIS — M75101 Unspecified rotator cuff tear or rupture of right shoulder, not specified as traumatic: Secondary | ICD-10-CM | POA: Diagnosis not present

## 2016-11-10 DIAGNOSIS — H65191 Other acute nonsuppurative otitis media, right ear: Secondary | ICD-10-CM

## 2016-11-10 DIAGNOSIS — H65199 Other acute nonsuppurative otitis media, unspecified ear: Secondary | ICD-10-CM | POA: Insufficient documentation

## 2016-11-10 NOTE — Assessment & Plan Note (Signed)
Patient was doing significantly better at this time.  Encourage patient to continue with the conservative therapy at this time.  We discussed icing regimen and home exercises.  We discussed which activities are doing which wants to avoid.  Patient will increase activity slowly over the course the next several days.

## 2016-11-10 NOTE — Assessment & Plan Note (Signed)
Problem.  Nothing it is more postinflammatory.  We discussed Flonase and other over-the-counter medications.  Encourage patient to finish with the antibiotics from another provider.  Follow-up as needed

## 2016-11-10 NOTE — Patient Instructions (Signed)
Good to see you  Ice is your friend at night for the shoulder Lets watch the hip  For the ear finish the medication and then flonase 1 spray each nostril daily for 2 weeks.  See me when you need me [phone]

## 2016-11-14 ENCOUNTER — Encounter: Payer: Self-pay | Admitting: Internal Medicine

## 2016-11-17 ENCOUNTER — Other Ambulatory Visit: Payer: Self-pay | Admitting: Internal Medicine

## 2016-11-17 MED ORDER — AMITRIPTYLINE HCL 25 MG PO TABS: 25.0000 mg | ORAL_TABLET | Freq: Every day | ORAL | 5 refills | Status: DC

## 2016-12-08 ENCOUNTER — Other Ambulatory Visit: Payer: Self-pay | Admitting: Internal Medicine

## 2016-12-08 ENCOUNTER — Encounter: Payer: Self-pay | Admitting: Internal Medicine

## 2016-12-09 ENCOUNTER — Other Ambulatory Visit: Payer: Self-pay | Admitting: Internal Medicine

## 2016-12-10 MED ORDER — ZOLPIDEM TARTRATE 10 MG PO TABS: 5.0000 mg | ORAL_TABLET | Freq: Every evening | ORAL | 1 refills | Status: DC | PRN

## 2016-12-17 ENCOUNTER — Encounter: Payer: Medicare Other | Admitting: Internal Medicine

## 2016-12-17 ENCOUNTER — Encounter: Payer: Medicare Other | Admitting: Gynecology

## 2016-12-26 ENCOUNTER — Ambulatory Visit: Payer: Medicare Other | Admitting: Family

## 2016-12-26 ENCOUNTER — Encounter: Payer: Self-pay | Admitting: Gynecology

## 2016-12-26 ENCOUNTER — Ambulatory Visit: Payer: Medicare Other | Admitting: Gynecology

## 2016-12-26 ENCOUNTER — Encounter: Payer: Self-pay | Admitting: Family

## 2016-12-26 VITALS — BP 120/74 | Ht 62.0 in | Wt 157.0 lb

## 2016-12-26 VITALS — BP 122/76 | HR 74 | Temp 98.1°F | Wt 157.0 lb

## 2016-12-26 DIAGNOSIS — N952 Postmenopausal atrophic vaginitis: Secondary | ICD-10-CM | POA: Diagnosis not present

## 2016-12-26 DIAGNOSIS — J019 Acute sinusitis, unspecified: Secondary | ICD-10-CM

## 2016-12-26 DIAGNOSIS — Z01411 Encounter for gynecological examination (general) (routine) with abnormal findings: Secondary | ICD-10-CM | POA: Diagnosis not present

## 2016-12-26 DIAGNOSIS — L309 Dermatitis, unspecified: Secondary | ICD-10-CM | POA: Diagnosis not present

## 2016-12-26 DIAGNOSIS — M8580 Other specified disorders of bone density and structure, unspecified site: Secondary | ICD-10-CM | POA: Diagnosis not present

## 2016-12-26 DIAGNOSIS — R040 Epistaxis: Secondary | ICD-10-CM | POA: Diagnosis not present

## 2016-12-26 MED ORDER — AMOXICILLIN-POT CLAVULANATE 875-125 MG PO TABS: 1.0000 | ORAL_TABLET | Freq: Two times a day (BID) | ORAL | 0 refills | Status: DC

## 2016-12-26 NOTE — Progress Notes (Signed)
    Ellen [name] June 08, 1950 [id number]        66 y.o.  [id number] for annual gynecologic exam.  Doing well without gynecologic complaints  Past medical history,surgical history, problem list, medications, allergies, family history and social history were all reviewed and documented as reviewed in the EPIC chart.  ROS:  Performed with pertinent positives and negatives included in the history, assessment and plan.   Additional significant findings : None   Exam: [name] assistant Vitals:   12/26/16 1458  BP: 120/74  Weight: 157 lb (71.2 kg)  Height: 5\' 2"  (1.575 m)   Body mass index is 28.72 kg/m.  General appearance:  Normal affect, orientation and appearance. Skin: Grossly normal HEENT: Without gross lesions.  No cervical or supraclavicular adenopathy. Thyroid normal.  Lungs:  Clear without wheezing, rales or rhonchi Cardiac: RR, without RMG Abdominal:  Soft, nontender, without masses, guarding, rebound, organomegaly or hernia Breasts:  Examined lying and sitting without masses, retractions, discharge or axillary adenopathy. Pelvic:  Ext, BUS, Vagina: With atrophic changes  Adnexa: Without masses or tenderness    Anus and perineum: Normal   Rectovaginal: Normal sphincter tone without palpated masses or tenderness.    Assessment/Plan:  66 y.o. [id number] female for annual gynecologic exam, status post TVH for bleeding 1991.   1. Postmenopausal/atrophic genital changes.  No significant hot flushes, night sweats. 2. Mammography scheduled 31 December.  Breast exam normal today.  SBE monthly reviewed. 3. Pap smear 2015.  Pap smear done today.  No history of abnormal Pap smears previously.  Options to stop screening altogether or less frequent screening intervals reviewed per current screening guidelines.  Will readdress on an annual basis. 4. Colonoscopy 2011.  Repeat at their recommended interval. 5. Osteopenia.  DEXA 01/2016 T score -1.2 FRAX 15% / 0.9% 6. Health maintenance.   No routine lab work done as patient does this elsewhere.  Follow-up 1 year, sooner as needed.   Anastasio Auerbach MD, 3:28 PM 12/26/2016

## 2016-12-26 NOTE — Addendum Note (Signed)
Addended by: Nelva Nay on: 12/26/2016 03:39 PM   Modules accepted: Orders

## 2016-12-26 NOTE — Patient Instructions (Signed)
Please use your Flonase; add saline nasal spray and consider using humidifer;

## 2016-12-26 NOTE — Patient Instructions (Signed)
Follow-up in 1 year for annual exam, sooner as needed. 

## 2016-12-26 NOTE — Progress Notes (Signed)
Ellen [name] is a 66 y.o. female with the following history as recorded in [facility]:  Patient Active Problem List   Diagnosis Date Noted  . Acute MEE (middle ear effusion) 11/10/2016  . Rotator cuff syndrome, right 10/13/2016  . Bacterial sinusitis 08/20/2016  . Greater trochanteric bursitis of right hip 09/24/2015  . Cellulitis and abscess of leg 08/31/2015  . Eosinophilia 03/27/2015  . Neoplasm of uncertain behavior of skin 12/14/2014  . Cough 09/28/2014  . Conjunctivitis 02/13/2014  . Hypothyroidism 01/13/2014  . Dark stools 04/16/2012  . RUQ abdominal pain 04/16/2012  . Cold sore 11/18/2011  . Well adult exam 07/15/2011  . Closed right ankle fracture 07/15/2011  . Hypertension 07/15/2011  . Weight gain 09/18/2010  . TOBACCO USE, QUIT 10/19/2008  . Rash and other nonspecific skin eruption 10/19/2007  . OTITIS MEDIA, CHRONIC SEROUS 08/31/2007  . HYPERLIPIDEMIA 07/28/2007  . GERD 04/13/2007  . SYNCOPE 04/13/2007  . Generalized anxiety disorder 10/16/2006  . INSOMNIA-SLEEP DISORDER-UNSPEC 10/16/2006  . DEPRESSION 10/16/2006  . ALLERGIC RHINITIS 10/16/2006  . 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 aspirin 81 MG tablet Take 81 mg by mouth daily.    . Calcium Carbonate-Vitamin D (CALCIUM + D PO) Take by mouth.    . Cholecalciferol (VITAMIN D3) 2000 units capsule Take 1 capsule (2,000 Units total) by mouth daily. 100 capsule 3  . citalopram (CELEXA) 20 MG tablet Take 1 tablet (20 mg total) by mouth daily. 90 tablet 3  . Cranberry 1000 MG CAPS Take 1 capsule by mouth daily. Reported on 06/07/2015    . docusate sodium (COLACE) 100 MG capsule Take 100 mg by mouth 2 (two) times daily.    [name] levothyroxine (SYNTHROID, LEVOTHROID) 125 MCG tablet Take 125 mcg by mouth 1 day or 1 dose.    . Melatonin 10 MG TABS Take 2 mg by mouth at bedtime and may repeat dose one time if needed.     . Multiple Vitamin (MULTIVITAMIN) tablet Take 1  tablet by mouth daily. Life's Abundance    . NON FORMULARY Allerfex  1 by mouth once daily as needed    . NON FORMULARY     . Valerian Root 100 MG CAPS Take 1 capsule by mouth daily.      [name] zinc gluconate 50 MG tablet Take 50 mg by mouth daily.    [name] zolpidem (AMBIEN) 10 MG tablet Take 0.5-1 tablets (5-10 mg total) by mouth at bedtime as needed for sleep. 90 tablet 1  . amoxicillin-clavulanate (AUGMENTIN) 875-125 MG tablet Take 1 tablet by mouth 2 (two) times daily for 10 days. 20 tablet 0   No current facility-administered medications for this visit.     Allergies: Belviq [lorcaserin hcl]; Demerol [meperidine]; Elavil [amitriptyline hcl]; and Trazodone and nefazodone  Past Medical History:  Diagnosis Date  . ALLERGIC RHINITIS   . ANXIETY   . GERD   . INSOMNIA-SLEEP DISORDER-UNSPEC   . Osteopenia 01/2016   T score -1.2 FRAX 15%/0.9%  . Thyroid disease     Past Surgical History:  Procedure Laterality Date  . ANKLE FRACTURE SURGERY  2012   R  . APPENDECTOMY    . BREAST SURGERY     REDUCTION MAMMOPLASTY  . CESAREAN SECTION     twice  . COSMETIC SURGERY    . FACELIFT     s/p  . TONSILLECTOMY  1974  . Upper nl TSH w/sx    .  VAGINAL HYSTERECTOMY  1991   TVH, irregular bleeding    Family History  Problem Relation Age of Onset  . Heart disease Father        CAD/CABG in his 34's  . Hypertension Mother   . Heart failure Mother     Social History   Tobacco Use  . Smoking status: Former Research scientist (life sciences)  . Smokeless tobacco: Never Used  Substance Use Topics  . Alcohol use: Yes    Alcohol/week: 0.0 oz    Comment: Rare    Subjective:  Patient presents with nose bleed, right ear pain, cough/ congestion x 3-4 days; recently stayed in hotel; prone to sinus infection; husband had similar symptoms earlier this week; no fever; no chest pain, no shortness of breath;   Objective:  Vitals:   12/26/16 1126  BP: 122/76  Pulse: 74  Temp: 98.1 F (36.7 C)  TempSrc: Oral  SpO2: 97%   Weight: 157 lb 0.6 oz (71.2 kg)    General: Well developed, well nourished, in no acute distress  Skin : Warm and dry.  Head: Normocephalic and atraumatic  Eyes: Sclera and conjunctiva clear; pupils round and reactive to light; extraocular movements intact  Ears: External normal; canals clear; tympanic membranes congested Oropharynx: Pink, supple. No suspicious lesions  Neck: Supple without thyromegaly, adenopathy  Lungs: Respirations unlabored; clear to auscultation bilaterally without wheeze, rales, rhonchi  CVS exam: normal rate and regular rhythm.  Neurologic: Alert and oriented; speech intact; face symmetrical; moves all extremities well; CNII-XII intact without focal deficit   Assessment:  1. Acute sinusitis, recurrence not specified, unspecified location   2. Epistaxis   3. Dermatitis     Plan:  1. Rx for Augmentin 875 mg bid x 10 days; re-start Flonase; increase fluids, rest; 2. Suspect due to dry air in hotel and airplane; encouraged saline nasal rinse, humidifier; follow-up if symptoms persist; 3. Okay to use her husband's topical steroid; moisturize skin as well; follow-up worse, no better.   No Follow-up on file.  No orders of the defined types were placed in this encounter.   Requested Prescriptions   Signed Prescriptions Disp Refills  . amoxicillin-clavulanate (AUGMENTIN) 875-125 MG tablet 20 tablet 0    Sig: Take 1 tablet by mouth 2 (two) times daily for 10 days.

## 2017-01-01 ENCOUNTER — Ambulatory Visit (INDEPENDENT_AMBULATORY_CARE_PROVIDER_SITE_OTHER): Payer: Medicare Other | Admitting: Internal Medicine

## 2017-01-01 ENCOUNTER — Other Ambulatory Visit (INDEPENDENT_AMBULATORY_CARE_PROVIDER_SITE_OTHER): Payer: Medicare Other

## 2017-01-01 ENCOUNTER — Encounter: Payer: Self-pay | Admitting: Internal Medicine

## 2017-01-01 VITALS — BP 124/80 | HR 69 | Temp 98.0°F | Ht 62.0 in | Wt 156.0 lb

## 2017-01-01 DIAGNOSIS — Z0000 Encounter for general adult medical examination without abnormal findings: Secondary | ICD-10-CM

## 2017-01-01 DIAGNOSIS — Z23 Encounter for immunization: Secondary | ICD-10-CM | POA: Diagnosis not present

## 2017-01-01 DIAGNOSIS — L309 Dermatitis, unspecified: Secondary | ICD-10-CM | POA: Diagnosis not present

## 2017-01-01 LAB — PAP IG W/ RFLX HPV ASCU

## 2017-01-01 LAB — LIPID PANEL
Cholesterol: 206 mg/dL — ABNORMAL HIGH (ref 0–200)
HDL: 62.8 mg/dL (ref >39.00)
LDL Cholesterol: 115 mg/dL — ABNORMAL HIGH (ref 0–99)
NonHDL: 143.42
Total CHOL/HDL Ratio: 3
Triglycerides: 144 mg/dL (ref 0.0–149.0)
VLDL: 28.8 mg/dL (ref 0.0–40.0)

## 2017-01-01 LAB — BASIC METABOLIC PANEL
BUN: 14 mg/dL (ref 6–23)
CO2: 29 mEq/L (ref 19–32)
Calcium: 8.8 mg/dL (ref 8.4–10.5)
Chloride: 105 mEq/L (ref 96–112)
Creatinine, Ser: 0.62 mg/dL (ref 0.40–1.20)
GFR: 101.65 mL/min (ref >60.00)
Glucose, Bld: 94 mg/dL (ref 70–99)
Potassium: 4 mEq/L (ref 3.5–5.1)
Sodium: 141 mEq/L (ref 135–145)

## 2017-01-01 LAB — CBC WITH DIFFERENTIAL/PLATELET
Basophils Absolute: 0.1 10*3/uL (ref 0.0–0.1)
Basophils Relative: 1.4 % (ref 0.0–3.0)
Eosinophils Absolute: 0.8 10*3/uL — ABNORMAL HIGH (ref 0.0–0.7)
Eosinophils Relative: 13.5 % — ABNORMAL HIGH (ref 0.0–5.0)
HCT: 42.5 % (ref 36.0–46.0)
Hemoglobin: 14 g/dL (ref 12.0–15.0)
Lymphocytes Relative: 26.4 % (ref 12.0–46.0)
Lymphs Abs: 1.6 10*3/uL (ref 0.7–4.0)
MCHC: 33 g/dL (ref 30.0–36.0)
MCV: 92 fl (ref 78.0–100.0)
Monocytes Absolute: 0.6 10*3/uL (ref 0.1–1.0)
Monocytes Relative: 9.5 % (ref 3.0–12.0)
Neutro Abs: 2.9 10*3/uL (ref 1.4–7.7)
Neutrophils Relative %: 49.2 % (ref 43.0–77.0)
Platelets: 201 10*3/uL (ref 150.0–400.0)
RBC: 4.61 Mil/uL (ref 3.87–5.11)
RDW: 14.5 % (ref 11.5–15.5)
WBC: 6 10*3/uL (ref 4.0–10.5)

## 2017-01-01 LAB — URINALYSIS
Bilirubin Urine: NEGATIVE
Hgb urine dipstick: NEGATIVE
Ketones, ur: NEGATIVE
Leukocytes, UA: NEGATIVE
Nitrite: NEGATIVE
Specific Gravity, Urine: <=1.005 — AB (ref 1.000–1.030)
Total Protein, Urine: NEGATIVE
Urine Glucose: NEGATIVE
Urobilinogen, UA: 0.2 (ref 0.0–1.0)
pH: 7 (ref 5.0–8.0)

## 2017-01-01 LAB — HEPATIC FUNCTION PANEL
ALT: 15 U/L (ref 0–35)
AST: 19 U/L (ref 0–37)
Albumin: 4.2 g/dL (ref 3.5–5.2)
Alkaline Phosphatase: 75 U/L (ref 39–117)
Bilirubin, Direct: 0.1 mg/dL (ref 0.0–0.3)
Total Bilirubin: 0.5 mg/dL (ref 0.2–1.2)
Total Protein: 6.8 g/dL (ref 6.0–8.3)

## 2017-01-01 LAB — TSH: TSH: 0.84 u[IU]/mL (ref 0.35–4.50)

## 2017-01-01 MED ORDER — LEVOTHYROXINE SODIUM 125 MCG PO TABS: 125.0000 ug | ORAL_TABLET | ORAL | 3 refills | Status: DC

## 2017-01-01 MED ORDER — CITALOPRAM HYDROBROMIDE 20 MG PO TABS: 20.0000 mg | ORAL_TABLET | Freq: Every day | ORAL | 3 refills | Status: DC

## 2017-01-01 MED ORDER — TRIAMCINOLONE ACETONIDE 0.5 % EX OINT: 1.0000 "application " | TOPICAL_OINTMENT | Freq: Two times a day (BID) | CUTANEOUS | 1 refills | Status: AC

## 2017-01-01 MED ORDER — ZOSTER VAC RECOMB ADJUVANTED 50 MCG/0.5ML IM SUSR: 0.5000 mL | Freq: Once | INTRAMUSCULAR | 1 refills | Status: AC

## 2017-01-01 NOTE — Addendum Note (Signed)
Addended by: Karren Cobble on: 01/01/2017 09:40 AM   Modules accepted: Orders

## 2017-01-01 NOTE — Progress Notes (Signed)
Subjective:  Patient ID: Ellen [name], female    DOB: [birth year]  Age: 66 y.o. MRN: [id number]  CC: No chief complaint on file.   HPI Ellen [name] presents for a well exam  Outpatient Medications Prior to Visit  Medication Sig Dispense Refill  . amoxicillin-clavulanate (AUGMENTIN) 875-125 MG tablet Take 1 tablet by mouth 2 (two) times daily for 10 days. 20 tablet 0  . aspirin 81 MG tablet Take 81 mg by mouth daily.    . Cholecalciferol (VITAMIN D3) 2000 units capsule Take 1 capsule (2,000 Units total) by mouth daily. 100 capsule 3  . citalopram (CELEXA) 20 MG tablet Take 1 tablet (20 mg total) by mouth daily. 90 tablet 3  . Cranberry 1000 MG CAPS Take 1 capsule by mouth daily. Reported on 06/07/2015    . docusate sodium (COLACE) 100 MG capsule Take 100 mg by mouth 2 (two) times daily.    [name] levothyroxine (SYNTHROID, LEVOTHROID) 125 MCG tablet Take 125 mcg by mouth 1 day or 1 dose.    . Melatonin 3 MG CAPS Take by mouth at bedtime as needed.    . Multiple Vitamin (MULTIVITAMIN) tablet Take 1 tablet by mouth daily. Life's Abundance    . NON FORMULARY Allerfex  1 by mouth once daily as needed    . NON FORMULARY     . Valerian Root 100 MG CAPS Take 1 capsule by mouth daily.      [name] zinc gluconate 50 MG tablet Take 50 mg by mouth daily.    [name] zolpidem (AMBIEN) 10 MG tablet Take 0.5-1 tablets (5-10 mg total) by mouth at bedtime as needed for sleep. 90 tablet 1  . Melatonin 10 MG TABS Take 2 mg by mouth at bedtime and may repeat dose one time if needed.      No facility-administered medications prior to visit.     ROS Review of Systems  Constitutional: Negative for activity change, appetite change, chills, fatigue and unexpected weight change.  HENT: Negative for congestion, mouth sores and sinus pressure.   Eyes: Negative for visual disturbance.  Respiratory: Negative for cough and chest tightness.   Gastrointestinal: Negative for abdominal pain and nausea.  Genitourinary: Negative  for difficulty urinating, frequency and vaginal pain.  Musculoskeletal: Negative for back pain and gait problem.  Skin: Negative for pallor and rash.  Neurological: Negative for dizziness, tremors, weakness, numbness and headaches.  Psychiatric/Behavioral: Negative for confusion and sleep disturbance.    Objective:  BP 124/80 (BP Location: Left Arm, Patient Position: Sitting, Cuff Size: Normal)   Pulse 69   Temp 98 F (36.7 C) (Oral)   Ht 5\' 2"  (1.575 m)   Wt 156 lb (70.8 kg)   SpO2 98%   BMI 28.53 kg/m   BP Readings from Last 3 Encounters:  01/01/17 124/80  12/26/16 120/74  12/26/16 122/76    Wt Readings from Last 3 Encounters:  01/01/17 156 lb (70.8 kg)  12/26/16 157 lb (71.2 kg)  12/26/16 157 lb 0.6 oz (71.2 kg)    Physical Exam  Constitutional: She appears well-developed. No distress.  HENT:  Head: Normocephalic.  Right Ear: External ear normal.  Left Ear: External ear normal.  Nose: Nose normal.  Mouth/Throat: Oropharynx is clear and moist.  Eyes: Conjunctivae are normal. Pupils are equal, round, and reactive to light. Right eye exhibits no discharge. Left eye exhibits no discharge.  Neck: Normal range of motion. Neck supple. No JVD present. No tracheal deviation present. No thyromegaly  present.  Cardiovascular: Normal rate, regular rhythm and normal heart sounds.  Pulmonary/Chest: No stridor. No respiratory distress. She has no wheezes.  Abdominal: Soft. Bowel sounds are normal. She exhibits no distension and no mass. There is no tenderness. There is no rebound and no guarding.  Musculoskeletal: She exhibits no edema or tenderness.  Lymphadenopathy:    She has no cervical adenopathy.  Neurological: She displays normal reflexes. No cranial nerve deficit. She exhibits normal muscle tone. Coordination normal.  Skin: No rash noted. No erythema.  Psychiatric: She has a normal mood and affect. Her behavior is normal. Judgment and thought content normal.  cracks on  fingers  Lab Results  Component Value Date   WBC 7.3 12/17/2015   HGB 14.0 12/17/2015   HCT 41.9 12/17/2015   PLT 227.0 12/17/2015   GLUCOSE 94 12/17/2015   CHOL 230 (H) 12/17/2015   TRIG 181.0 (H) 12/17/2015   HDL 73.20 12/17/2015   LDLDIRECT 142.6 07/09/2011   LDLCALC 121 (H) 12/17/2015   ALT 17 12/17/2015   AST 20 12/17/2015   NA 141 12/17/2015   K 4.2 12/17/2015   CL 104 12/17/2015   CREATININE 0.71 12/17/2015   BUN 18 12/17/2015   CO2 29 12/17/2015   TSH 3.05 12/17/2015   HGBA1C 5.6 10/12/2006    No results found.  Assessment & Plan:   There are no diagnoses linked to this encounter. I am having Vaunda L. Mckinlay maintain her NON FORMULARY, Cranberry, Valerian Root, multivitamin, zinc gluconate, aspirin, docusate sodium, citalopram, Vitamin D3, levothyroxine, NON FORMULARY, zolpidem, amoxicillin-clavulanate, and Melatonin.  No orders of the defined types were placed in this encounter.    Follow-up: No Follow-up on file.  Walker Kehr, MD

## 2017-01-01 NOTE — Assessment & Plan Note (Signed)

## 2017-01-01 NOTE — Assessment & Plan Note (Signed)
fingers - Triamc oint

## 2017-01-01 NOTE — Patient Instructions (Signed)
Health Maintenance for Postmenopausal Women Menopause is a normal process in which your reproductive ability comes to an end. This process happens gradually over a span of months to years, usually between the ages of 31 and 69. Menopause is complete when you have missed 12 consecutive menstrual periods. It is important to talk with your health care provider about some of the most common conditions that affect postmenopausal women, such as heart disease, cancer, and bone loss (osteoporosis). Adopting a healthy lifestyle and getting preventive care can help to promote your health and wellness. Those actions can also lower your chances of developing some of these common conditions. What should I know about menopause? During menopause, you may experience a number of symptoms, such as:  Moderate-to-severe hot flashes.  Night sweats.  Decrease in sex drive.  Mood swings.  Headaches.  Tiredness.  Irritability.  Memory problems.  Insomnia.  Choosing to treat or not to treat menopausal changes is an individual decision that you make with your health care provider. What should I know about hormone replacement therapy and supplements? Hormone therapy products are effective for treating symptoms that are associated with menopause, such as hot flashes and night sweats. Hormone replacement carries certain risks, especially as you become older. If you are thinking about using estrogen or estrogen with progestin treatments, discuss the benefits and risks with your health care provider. What should I know about heart disease and stroke? Heart disease, heart attack, and stroke become more likely as you age. This may be due, in part, to the hormonal changes that your body experiences during menopause. These can affect how your body processes dietary fats, triglycerides, and cholesterol. Heart attack and stroke are both medical emergencies. There are many things that you can do to help prevent heart disease  and stroke:  Have your blood pressure checked at least every 1-2 years. High blood pressure causes heart disease and increases the risk of stroke.  If you are 85-37 years old, ask your health care provider if you should take aspirin to prevent a heart attack or a stroke.  Do not use any tobacco products, including cigarettes, chewing tobacco, or electronic cigarettes. If you need help quitting, ask your health care provider.  It is important to eat a healthy diet and maintain a healthy weight. ? Be sure to include plenty of vegetables, fruits, low-fat dairy products, and lean protein. ? Avoid eating foods that are high in solid fats, added sugars, or salt (sodium).  Get regular exercise. This is one of the most important things that you can do for your health. ? Try to exercise for at least 150 minutes each week. The type of exercise that you do should increase your heart rate and make you sweat. This is known as moderate-intensity exercise. ? Try to do strengthening exercises at least twice each week. Do these in addition to the moderate-intensity exercise.  Know your numbers.Ask your health care provider to check your cholesterol and your blood glucose. Continue to have your blood tested as directed by your health care provider.  What should I know about cancer screening? There are several types of cancer. Take the following steps to reduce your risk and to catch any cancer development as early as possible. Breast Cancer  Practice breast self-awareness. ? This means understanding how your breasts normally appear and feel. ? It also means doing regular breast self-exams. Let your health care provider know about any changes, no matter how small.  If you are 40  or older, have a clinician do a breast exam (clinical breast exam or CBE) every year. Depending on your age, family history, and medical history, it may be recommended that you also have a yearly breast X-ray (mammogram).  If you  have a family history of breast cancer, talk with your health care provider about genetic screening.  If you are at high risk for breast cancer, talk with your health care provider about having an MRI and a mammogram every year.  Breast cancer (BRCA) gene test is recommended for women who have family members with BRCA-related cancers. Results of the assessment will determine the need for genetic counseling and BRCA1 and for BRCA2 testing. BRCA-related cancers include these types: ? Breast. This occurs in males or females. ? Ovarian. ? Tubal. This may also be called fallopian tube cancer. ? Cancer of the abdominal or pelvic lining (peritoneal cancer). ? Prostate. ? Pancreatic.  Cervical, Uterine, and Ovarian Cancer Your health care provider may recommend that you be screened regularly for cancer of the pelvic organs. These include your ovaries, uterus, and vagina. This screening involves a pelvic exam, which includes checking for microscopic changes to the surface of your cervix (Pap test).  For women ages 21-65, health care providers may recommend a pelvic exam and a Pap test every three years. For women ages 79-65, they may recommend the Pap test and pelvic exam, combined with testing for human papilloma virus (HPV), every five years. Some types of HPV increase your risk of cervical cancer. Testing for HPV may also be done on women of any age who have unclear Pap test results.  Other health care providers may not recommend any screening for nonpregnant women who are considered low risk for pelvic cancer and have no symptoms. Ask your health care provider if a screening pelvic exam is right for you.  If you have had past treatment for cervical cancer or a condition that could lead to cancer, you need Pap tests and screening for cancer for at least 20 years after your treatment. If Pap tests have been discontinued for you, your risk factors (such as having a new sexual partner) need to be  reassessed to determine if you should start having screenings again. Some women have medical problems that increase the chance of getting cervical cancer. In these cases, your health care provider may recommend that you have screening and Pap tests more often.  If you have a family history of uterine cancer or ovarian cancer, talk with your health care provider about genetic screening.  If you have vaginal bleeding after reaching menopause, tell your health care provider.  There are currently no reliable tests available to screen for ovarian cancer.  Lung Cancer Lung cancer screening is recommended for adults 69-62 years old who are at high risk for lung cancer because of a history of smoking. A yearly low-dose CT scan of the lungs is recommended if you:  Currently smoke.  Have a history of at least 30 pack-years of smoking and you currently smoke or have quit within the past 15 years. A pack-year is smoking an average of one pack of cigarettes per day for one year.  Yearly screening should:  Continue until it has been 15 years since you quit.  Stop if you develop a health problem that would prevent you from having lung cancer treatment.  Colorectal Cancer  This type of cancer can be detected and can often be prevented.  Routine colorectal cancer screening usually begins at  age 42 and continues through age 45.  If you have risk factors for colon cancer, your health care provider may recommend that you be screened at an earlier age.  If you have a family history of colorectal cancer, talk with your health care provider about genetic screening.  Your health care provider may also recommend using home test kits to check for hidden blood in your stool.  A small camera at the end of a tube can be used to examine your colon directly (sigmoidoscopy or colonoscopy). This is done to check for the earliest forms of colorectal cancer.  Direct examination of the colon should be repeated every  5-10 years until age 71. However, if early forms of precancerous polyps or small growths are found or if you have a family history or genetic risk for colorectal cancer, you may need to be screened more often.  Skin Cancer  Check your skin from head to toe regularly.  Monitor any moles. Be sure to tell your health care provider: ? About any new moles or changes in moles, especially if there is a change in a mole's shape or color. ? If you have a mole that is larger than the size of a pencil eraser.  If any of your family members has a history of skin cancer, especially at a young age, talk with your health care provider about genetic screening.  Always use sunscreen. Apply sunscreen liberally and repeatedly throughout the day.  Whenever you are outside, protect yourself by wearing long sleeves, pants, a wide-brimmed hat, and sunglasses.  What should I know about osteoporosis? Osteoporosis is a condition in which bone destruction happens more quickly than new bone creation. After menopause, you may be at an increased risk for osteoporosis. To help prevent osteoporosis or the bone fractures that can happen because of osteoporosis, the following is recommended:  If you are 46-71 years old, get at least 1,000 mg of calcium and at least 600 mg of vitamin D per day.  If you are older than age 55 but younger than age 65, get at least 1,200 mg of calcium and at least 600 mg of vitamin D per day.  If you are older than age 54, get at least 1,200 mg of calcium and at least 800 mg of vitamin D per day.  Smoking and excessive alcohol intake increase the risk of osteoporosis. Eat foods that are rich in calcium and vitamin D, and do weight-bearing exercises several times each week as directed by your health care provider. What should I know about how menopause affects my mental health? Depression may occur at any age, but it is more common as you become older. Common symptoms of depression  include:  Low or sad mood.  Changes in sleep patterns.  Changes in appetite or eating patterns.  Feeling an overall lack of motivation or enjoyment of activities that you previously enjoyed.  Frequent crying spells.  Talk with your health care provider if you think that you are experiencing depression. What should I know about immunizations? It is important that you get and maintain your immunizations. These include:  Tetanus, diphtheria, and pertussis (Tdap) booster vaccine.  Influenza every year before the flu season begins.  Pneumonia vaccine.  Shingles vaccine.  Your health care provider may also recommend other immunizations. This information is not intended to replace advice given to you by your health care provider. Make sure you discuss any questions you have with your health care provider. Document Released: 02/14/2005  Document Revised: 07/13/2015 Document Reviewed: 09/26/2014 Elsevier Interactive Patient Education  [employer].

## 2017-01-05 DIAGNOSIS — Z1231 Encounter for screening mammogram for malignant neoplasm of breast: Secondary | ICD-10-CM | POA: Diagnosis not present

## 2017-01-05 LAB — HM MAMMOGRAPHY

## 2017-01-13 ENCOUNTER — Encounter: Payer: Self-pay | Admitting: Internal Medicine

## 2017-01-13 NOTE — Progress Notes (Signed)
Outside notes received. Information abstracted. Notes sent to scan.  

## 2017-01-16 ENCOUNTER — Telehealth: Payer: Self-pay | Admitting: Internal Medicine

## 2017-01-16 DIAGNOSIS — Z79899 Other long term (current) drug therapy: Secondary | ICD-10-CM | POA: Diagnosis not present

## 2017-01-16 DIAGNOSIS — L239 Allergic contact dermatitis, unspecified cause: Secondary | ICD-10-CM | POA: Diagnosis not present

## 2017-01-16 DIAGNOSIS — L649 Androgenic alopecia, unspecified: Secondary | ICD-10-CM | POA: Diagnosis not present

## 2017-01-16 DIAGNOSIS — L659 Nonscarring hair loss, unspecified: Secondary | ICD-10-CM | POA: Diagnosis not present

## 2017-01-16 NOTE — Telephone Encounter (Signed)
Copied from [place] [id number]. Topic: Quick Communication - See Telephone Encounter >> Jan 16, 2017 11:15 AM Oneta Rack wrote: CRM for notification. See Telephone encounter for:   01/16/17.   Relation to pt: self  Call back number: [phone]   Reason for call:  Patient would like to pick up most recent lab results today, please advise when ready.

## 2017-01-16 NOTE — Telephone Encounter (Signed)
Labs have been printed and ready to be picked up.

## 2017-02-09 ENCOUNTER — Telehealth: Payer: Self-pay | Admitting: Internal Medicine

## 2017-02-09 NOTE — Telephone Encounter (Signed)
So[facility] with [facility], called patient back and gave her the [facility] phone number to call and get 2nd injection there

## 2017-02-09 NOTE — Telephone Encounter (Signed)
Copied from [place] [id number]. Topic: Quick Communication - See Telephone Encounter >> Feb 09, 2017  3:44 PM Oneta Rack wrote: CRM for notification. See Telephone encounter for:   02/09/17.  Relation to pt: self  Call back number: [phone]  Reason for call:  Patient received 12/27/08 shingle vaccination from [place], [place] - [address] Phone: [phone] Fax: [phone] and was advised by the pharmacy that 2nd shot is on back order, patient seeking clinical advice. (patient called several other pharmacies and they don't have in stock)    >> Feb 09, 2017  3:47 PM Oneta Rack wrote: CRM for notification. See Telephone encounter for:   02/09/17.  Relation to pt: self  Call back number: [phone]  Reason for call:  Patient received 12/27/08 shingle vaccination from [place], [place] - [address] Phone: [phone] Fax: [phone] and was advised by the pharmacy that 2nd shot is on back order, patient seeking clinical advice. (patient called several other pharmacies and they don't have in stock)

## 2017-02-10 DIAGNOSIS — H66006 Acute suppurative otitis media without spontaneous rupture of ear drum, recurrent, bilateral: Secondary | ICD-10-CM | POA: Diagnosis not present

## 2017-02-10 DIAGNOSIS — H66003 Acute suppurative otitis media without spontaneous rupture of ear drum, bilateral: Secondary | ICD-10-CM | POA: Insufficient documentation

## 2017-02-10 DIAGNOSIS — H6983 Other specified disorders of Eustachian tube, bilateral: Secondary | ICD-10-CM | POA: Insufficient documentation

## 2017-02-10 DIAGNOSIS — H6993 Unspecified Eustachian tube disorder, bilateral: Secondary | ICD-10-CM | POA: Insufficient documentation

## 2017-02-10 DIAGNOSIS — H903 Sensorineural hearing loss, bilateral: Secondary | ICD-10-CM | POA: Diagnosis not present

## 2017-02-10 DIAGNOSIS — J310 Chronic rhinitis: Secondary | ICD-10-CM | POA: Diagnosis not present

## 2017-02-13 DIAGNOSIS — L300 Nummular dermatitis: Secondary | ICD-10-CM | POA: Diagnosis not present

## 2017-02-13 DIAGNOSIS — L820 Inflamed seborrheic keratosis: Secondary | ICD-10-CM | POA: Diagnosis not present

## 2017-03-28 IMAGING — US US ABDOMEN COMPLETE
1 series · 14 of 25 positions shown · non-contrast
Comparison: None.

CLINICAL DATA: Right upper quadrant and epigastric pain.

EXAM:
ABDOMEN ULTRASOUND COMPLETE

[Series 1: us abdomen complete · 0.13mm/px · 14 of 97 slices shown]
[im 1/97]
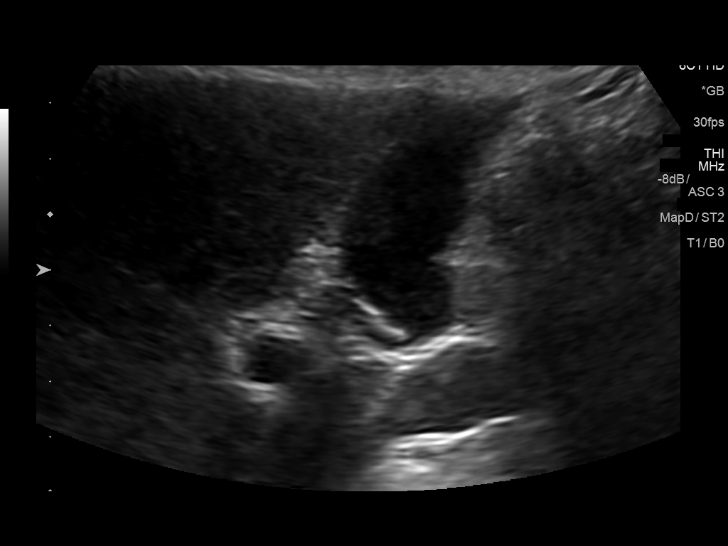
[im 9/97]
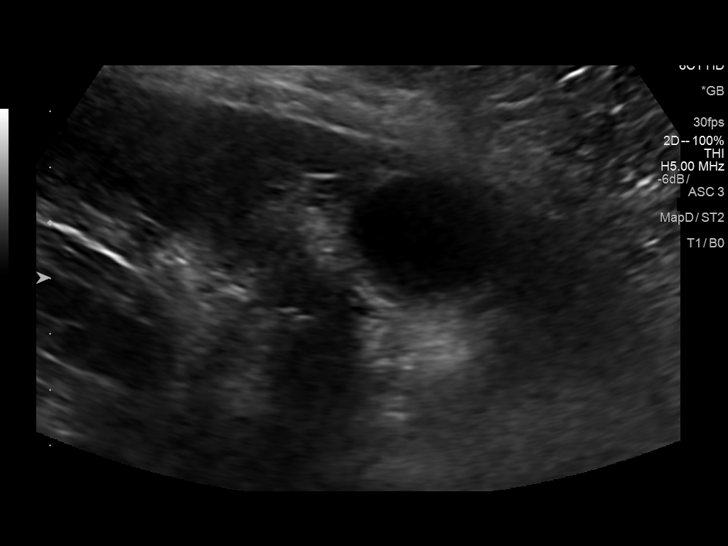
[im 17/97]
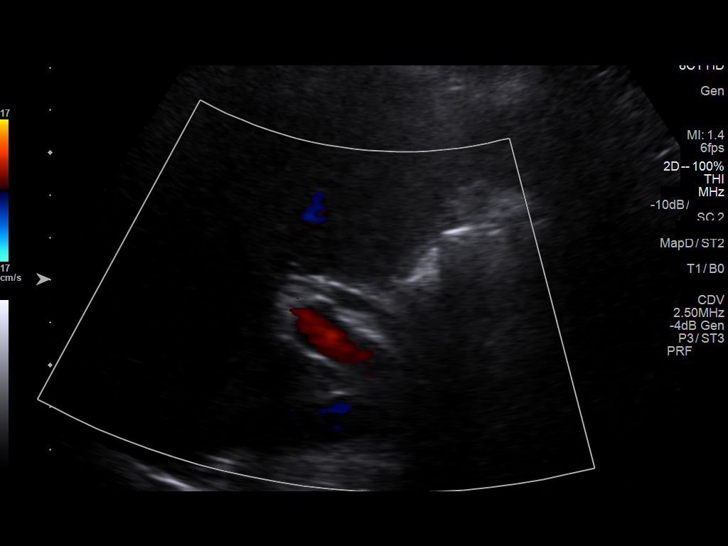
[im 25/97]
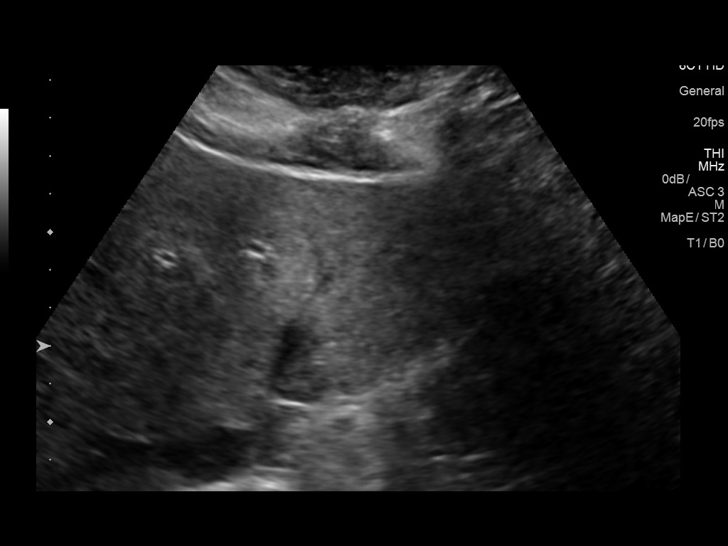
[im 33/97]
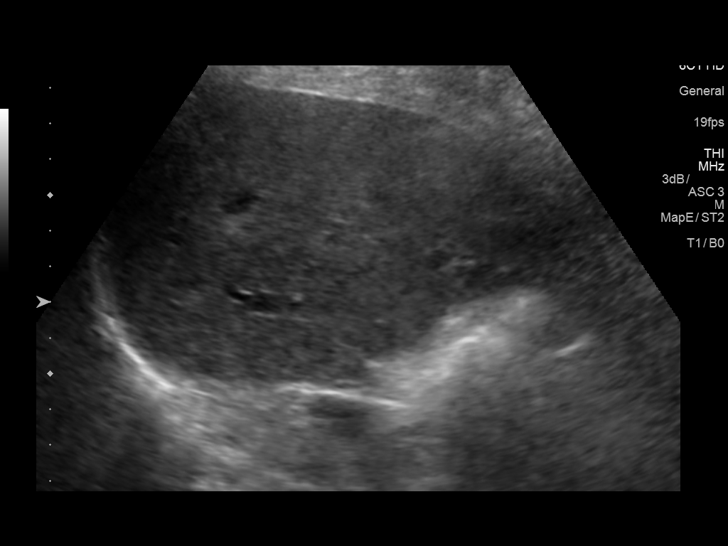
[im 37/97]
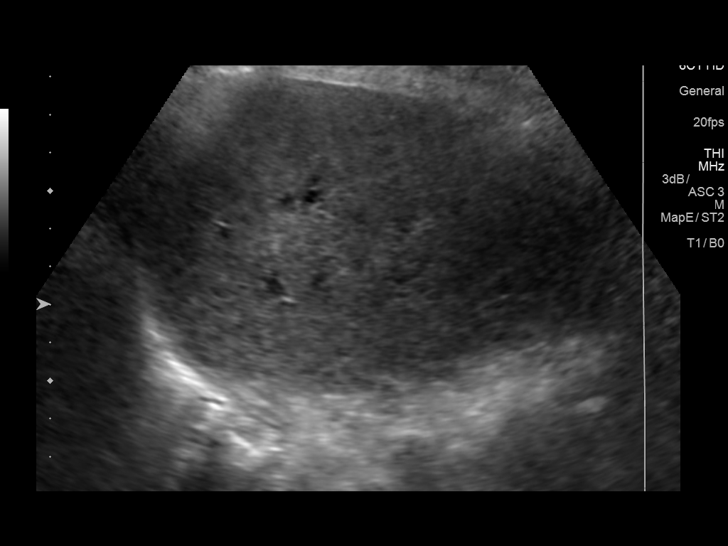
[im 45/97]
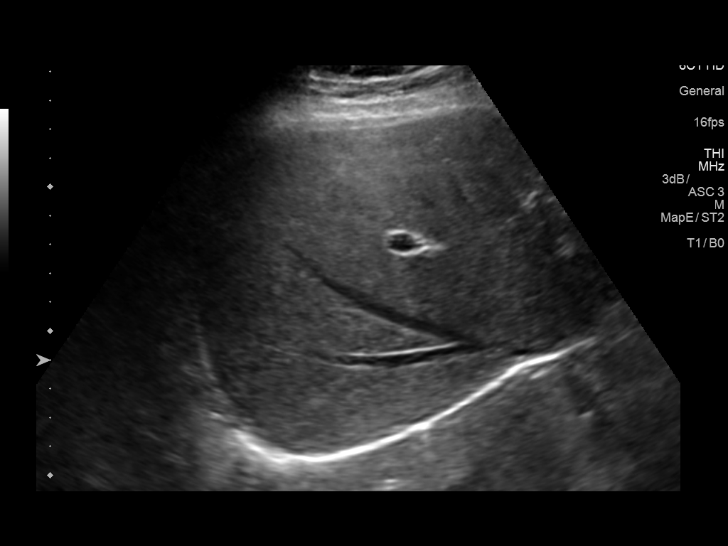
[im 53/97]
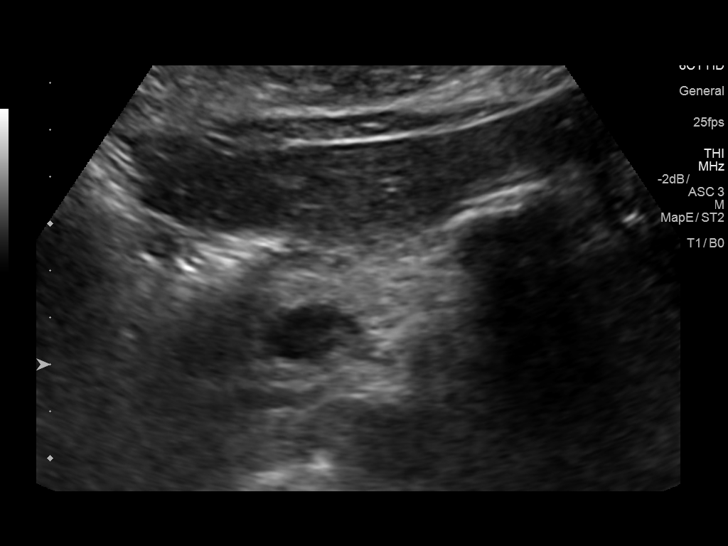
[im 61/97]
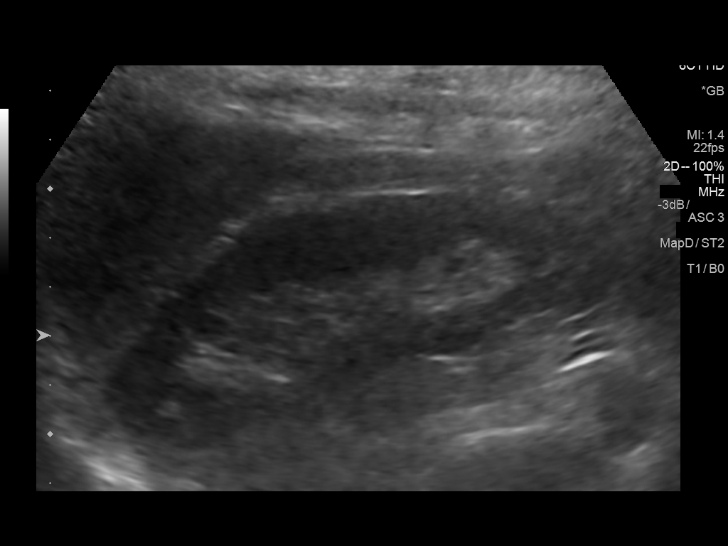
[im 65/97]
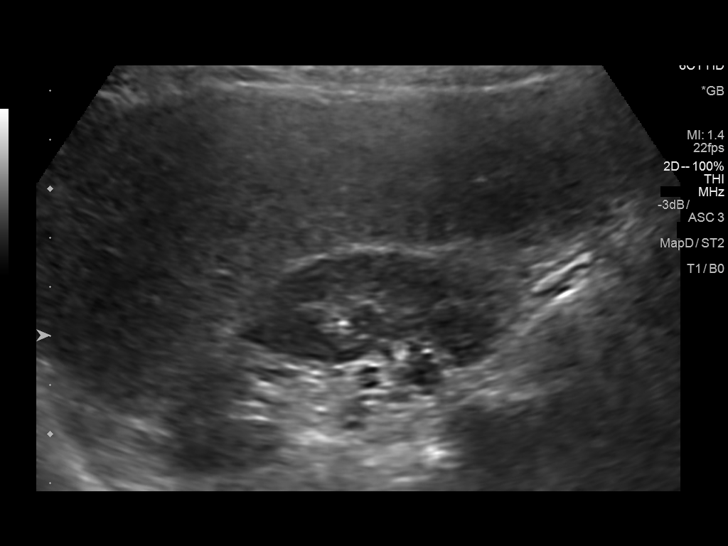
[im 73/97]
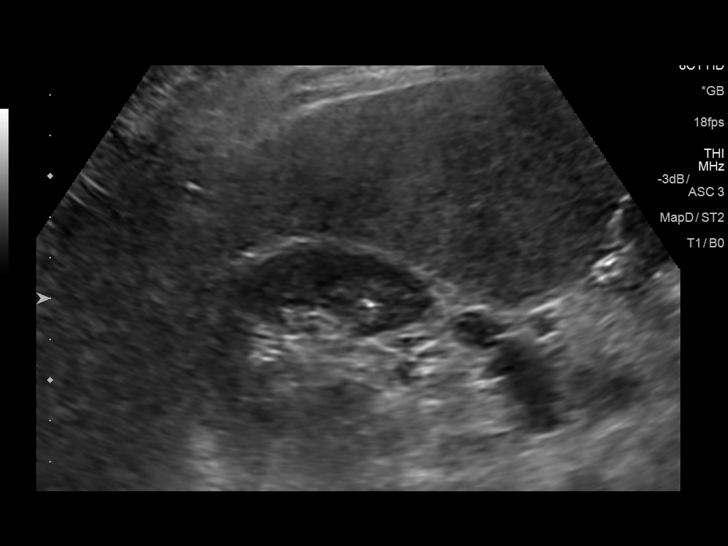
[im 81/97]
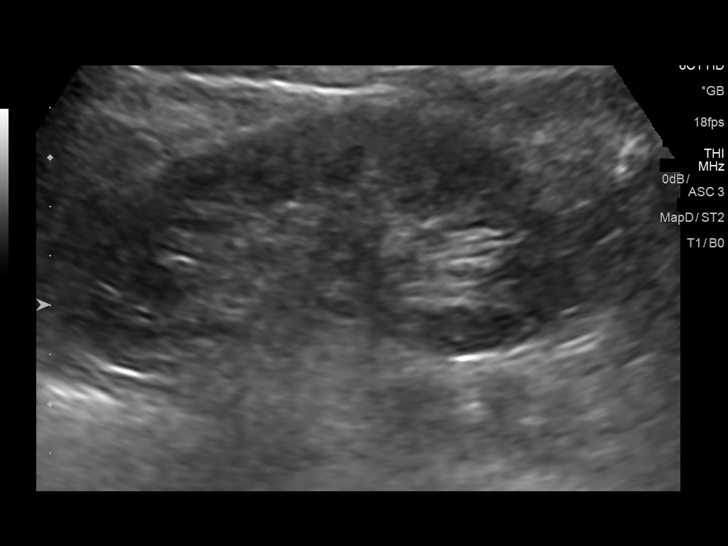
[im 89/97]
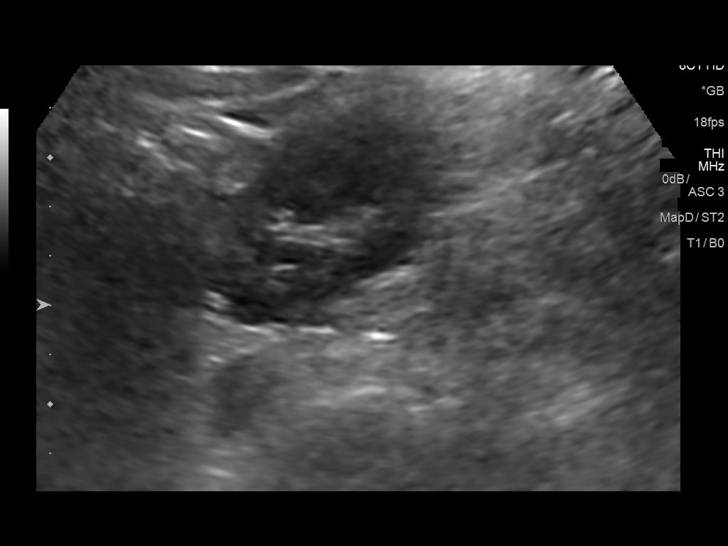
[im 97/97]
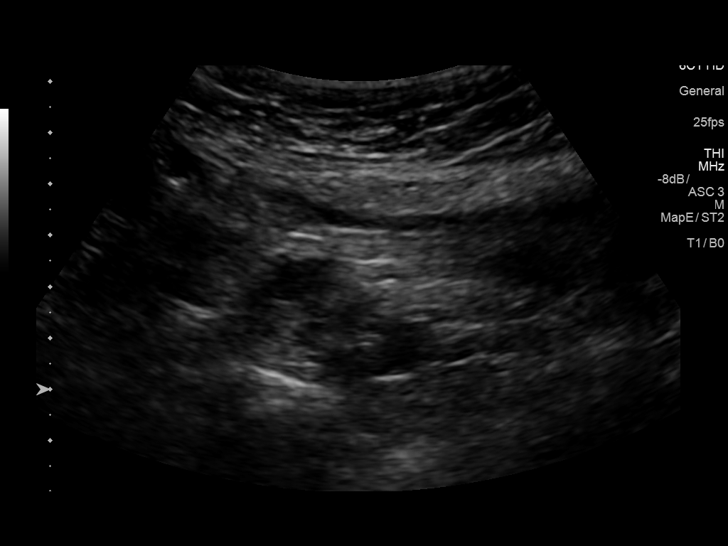

[14 of 25 positions shown; findings below may reference images not displayed]

FINDINGS: Gallbladder: No gallstones or wall thickening visualized. No
sonographic Murphy sign noted by sonographer.

Common bile duct: Diameter: 4.3 mm

Liver: No focal lesion identified. Within normal limits in
parenchymal echogenicity. Portal vein is patent on color Doppler
imaging with normal direction of blood flow towards the liver.

IVC: No abnormality visualized.

Pancreas: Visualized portion unremarkable.

Spleen: Size and appearance within normal limits.

Right Kidney: Length: 11.0 cm. Echogenicity within normal limits. No
mass or hydronephrosis visualized.

Left Kidney: Length: 11.0 cm. Echogenicity within normal limits. No
mass or hydronephrosis visualized.

Abdominal aorta: No aneurysm visualized.

Other findings: None.
IMPRESSION: Normal abdominal ultrasound.

## 2017-04-21 DIAGNOSIS — E063 Autoimmune thyroiditis: Secondary | ICD-10-CM | POA: Diagnosis not present

## 2017-04-21 DIAGNOSIS — E039 Hypothyroidism, unspecified: Secondary | ICD-10-CM | POA: Diagnosis not present

## 2017-04-21 DIAGNOSIS — Z92241 Personal history of systemic steroid therapy: Secondary | ICD-10-CM | POA: Diagnosis not present

## 2017-04-27 ENCOUNTER — Encounter: Payer: Self-pay | Admitting: Gynecology

## 2017-04-27 ENCOUNTER — Ambulatory Visit: Payer: Medicare Other | Admitting: Gynecology

## 2017-04-27 VITALS — BP 124/76

## 2017-04-27 DIAGNOSIS — R102 Pelvic and perineal pain: Secondary | ICD-10-CM | POA: Diagnosis not present

## 2017-04-27 NOTE — Progress Notes (Signed)
    Ellen [name], Ellen [name] [id number]        67 y.o.  [id number] presents with a one-week history of lower abdominal/suprapubic discomfort.  Patient notes having a headache and possible low-grade temperature on and off.  Having suprapubic nagging discomfort and thought that she may be developing a UTI that she has had in the past.  No frequency dysuria urgency low back pain.  No diarrhea or constipation.  Status post hysterectomy in the past.  Past medical history,surgical history, problem list, medications, allergies, family history and social history were all reviewed and documented in the EPIC chart.  Directed ROS with pertinent positives and negatives documented in the history of present illness/assessment and plan.  Exam: [name] assistant Vitals:   04/27/17 1103  BP: 124/76   General appearance:  Normal Abdomen soft nontender without masses guarding rebound Pelvic external BUS vagina with atrophic changes.  No discharge.  No masses or tenderness on bimanual exam.  Rectal exam is normal.  Assessment/Plan:  67 y.o. [id number] with above history and exam.  Urine analysis is totally negative.  Discussed differential to include GU, GI and GYN.  Patient still has her ovaries and the possibility of ovarian disease reviewed although no palpable abnormalities.  Alternatives to include GI as well as noninfectious GU such as IC.  At this point as she is not acute and otherwise doing well without nausea vomiting diarrhea constipation.  Will monitor and hydrate.  If discomfort continues she will call and we will start with ultrasound to rule out ovarian process.  Assuming this would be negative then we would refer to GI.  Patient agrees with the plan and will call in several days if her discomfort continues.  Greater than 50% of my 15-minute visit was spent in direct face to face counseling and coordination of care with the patient.     Anastasio Auerbach MD, 11:35 AM 04/27/2017

## 2017-04-27 NOTE — Addendum Note (Signed)
Addended by: Nelva Nay on: 04/27/2017 11:42 AM   Modules accepted: Orders

## 2017-04-27 NOTE — Patient Instructions (Addendum)
Call if the pain continues and we will arrange for ultrasound.

## 2017-04-28 LAB — URINALYSIS, COMPLETE W/RFL CULTURE
Bacteria, UA: NONE SEEN /HPF
Bilirubin Urine: NEGATIVE
Glucose, UA: NEGATIVE
Hgb urine dipstick: NEGATIVE
Hyaline Cast: NONE SEEN /LPF
Ketones, ur: NEGATIVE
Leukocyte Esterase: NEGATIVE
Nitrites, Initial: NEGATIVE
Protein, ur: NEGATIVE
RBC / HPF: NONE SEEN /HPF (ref 0–2)
Specific Gravity, Urine: 1.01 (ref 1.001–1.03)
WBC, UA: NONE SEEN /HPF (ref 0–5)
pH: 7 (ref 5.0–8.0)

## 2017-04-28 LAB — NO CULTURE INDICATED

## 2017-05-11 ENCOUNTER — Encounter: Payer: Self-pay | Admitting: Internal Medicine

## 2017-05-14 ENCOUNTER — Other Ambulatory Visit: Payer: Self-pay | Admitting: Internal Medicine

## 2017-05-14 MED ORDER — ZOLPIDEM TARTRATE ER 12.5 MG PO TBCR: 12.5000 mg | EXTENDED_RELEASE_TABLET | Freq: Every evening | ORAL | 1 refills | Status: DC | PRN

## 2017-05-21 ENCOUNTER — Telehealth: Payer: Self-pay

## 2017-05-21 NOTE — Telephone Encounter (Signed)
Key: [id number]  PA started today via Cover My Med. Currently pending approval.

## 2017-05-22 ENCOUNTER — Encounter: Payer: Self-pay | Admitting: Internal Medicine

## 2017-05-26 NOTE — Telephone Encounter (Signed)
PA approved.

## 2017-05-27 ENCOUNTER — Other Ambulatory Visit: Payer: Self-pay | Admitting: *Deleted

## 2017-05-27 DIAGNOSIS — R102 Pelvic and perineal pain: Secondary | ICD-10-CM

## 2017-06-03 ENCOUNTER — Ambulatory Visit: Payer: Medicare Other | Admitting: Family

## 2017-06-03 ENCOUNTER — Other Ambulatory Visit (INDEPENDENT_AMBULATORY_CARE_PROVIDER_SITE_OTHER): Payer: Medicare Other

## 2017-06-03 ENCOUNTER — Encounter: Payer: Self-pay | Admitting: Family

## 2017-06-03 VITALS — BP 112/80 | HR 65 | Temp 98.0°F | Ht 62.0 in | Wt 153.1 lb

## 2017-06-03 DIAGNOSIS — R300 Dysuria: Secondary | ICD-10-CM

## 2017-06-03 DIAGNOSIS — R102 Pelvic and perineal pain: Secondary | ICD-10-CM | POA: Diagnosis not present

## 2017-06-03 DIAGNOSIS — N941 Unspecified dyspareunia: Secondary | ICD-10-CM | POA: Diagnosis not present

## 2017-06-03 LAB — URINALYSIS
Bilirubin Urine: NEGATIVE
Hgb urine dipstick: NEGATIVE
Ketones, ur: NEGATIVE
Leukocytes, UA: NEGATIVE
Nitrite: NEGATIVE
Specific Gravity, Urine: <=1.005 — AB (ref 1.000–1.030)
Total Protein, Urine: NEGATIVE
Urine Glucose: NEGATIVE
Urobilinogen, UA: 0.2 (ref 0.0–1.0)
pH: 7 (ref 5.0–8.0)

## 2017-06-03 MED ORDER — NITROFURANTOIN MONOHYD MACRO 100 MG PO CAPS: 100.0000 mg | ORAL_CAPSULE | Freq: Two times a day (BID) | ORAL | 0 refills | Status: DC

## 2017-06-03 NOTE — Progress Notes (Signed)
Shatavia Ellary Casamento is a 67 y.o. female with the following history as recorded in EpicCare:  Patient Active Problem List   Diagnosis Date Noted  . Dermatitis 01/01/2017  . Acute MEE (middle ear effusion) 11/10/2016  . Rotator cuff syndrome, right 10/13/2016  . Bacterial sinusitis 08/20/2016  . Greater trochanteric bursitis of right hip 09/24/2015  . Cellulitis and abscess of leg 08/31/2015  . Eosinophilia 03/27/2015  . Neoplasm of uncertain behavior of skin 12/14/2014  . Cough 09/28/2014  . Conjunctivitis 02/13/2014  . Hypothyroidism 01/13/2014  . Dark stools 04/16/2012  . RUQ abdominal pain 04/16/2012  . Cold sore 11/18/2011  . Well adult exam 07/15/2011  . Closed right ankle fracture 07/15/2011  . Hypertension 07/15/2011  . Weight gain 09/18/2010  . TOBACCO USE, QUIT 10/19/2008  . Rash and other nonspecific skin eruption 10/19/2007  . OTITIS MEDIA, CHRONIC SEROUS 08/31/2007  . HYPERLIPIDEMIA 07/28/2007  . GERD 04/13/2007  . SYNCOPE 04/13/2007  . Generalized anxiety disorder 10/16/2006  . INSOMNIA-SLEEP DISORDER-UNSPEC 10/16/2006  . DEPRESSION 10/16/2006  . ALLERGIC RHINITIS 10/16/2006  . 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 aspirin 81 MG tablet Take 81 mg by mouth daily.    . Cholecalciferol (VITAMIN D3) 2000 units capsule Take 1 capsule (2,000 Units total) by mouth daily. 100 capsule 3  . citalopram (CELEXA) 20 MG tablet Take 1 tablet (20 mg total) by mouth daily. 90 tablet 3  . Cranberry 1000 MG CAPS Take 1 capsule by mouth daily. Reported on 06/07/2015    . CRANBERRY EXTRACT PO Take by mouth.    . docusate sodium (COLACE) 100 MG capsule Take 100 mg by mouth 2 (two) times daily.    . finasteride (PROSCAR) 5 MG tablet Take 5 mg by mouth daily.    . finasteride (PROSCAR) 5 MG tablet     . fluocinonide (LIDEX) 0.05 % external solution     . levothyroxine (SYNTHROID, LEVOTHROID) 125 MCG tablet Take 1 tablet (125 mcg  total) by mouth 1 day or 1 dose. 90 tablet 3  . Melatonin 3 MG CAPS Take by mouth at bedtime as needed.    . Multiple Vitamin (MULTIVITAMIN) tablet Take 1 tablet by mouth daily. Life's Abundance    . NON FORMULARY Allerfex  1 by mouth once daily as needed    . NON FORMULARY     . triamcinolone ointment (KENALOG) 0.5 % Apply 1 application topically 2 (two) times daily. 30 g 1  . Valerian Root 100 MG CAPS Take 1 capsule by mouth daily.      [name] zinc gluconate 50 MG tablet Take 50 mg by mouth daily.    [name] zolpidem (AMBIEN CR) 12.5 MG CR tablet Take 1 tablet (12.5 mg total) by mouth at bedtime as needed for sleep. 90 tablet 1  . Zoster Vaccine Adjuvanted [facility]) injection     . nitrofurantoin, macrocrystal-monohydrate, (MACROBID) 100 MG capsule Take 1 capsule (100 mg total) by mouth 2 (two) times daily. 14 capsule 0   No current facility-administered medications for this visit.     Allergies: Belviq [lorcaserin hcl]; Demerol [meperidine]; Elavil [amitriptyline hcl]; Trazodone and nefazodone; and Skin adhesives  Past Medical History:  Diagnosis Date  . ALLERGIC RHINITIS   . ANXIETY   . GERD   . INSOMNIA-SLEEP DISORDER-UNSPEC   . Osteopenia 01/2016   T score -1.2 FRAX 15%/0.9%  . Thyroid disease     Past Surgical History:  Procedure Laterality  Date  . ANKLE FRACTURE SURGERY  2012   R  . APPENDECTOMY    . BREAST SURGERY     REDUCTION MAMMOPLASTY  . CESAREAN SECTION     twice  . COSMETIC SURGERY    . FACELIFT     s/p  . TONSILLECTOMY  1974  . Upper nl TSH w/sx    . VAGINAL HYSTERECTOMY  1991   TVH, irregular bleeding    Family History  Problem Relation Age of Onset  . Heart disease Father        CAD/CABG in his 78's  . Hypertension Mother   . Heart failure Mother     Social History   Tobacco Use  . Smoking status: Former Research scientist (life sciences)  . Smokeless tobacco: Never Used  Substance Use Topics  . Alcohol use: Yes    Alcohol/week: 6.0 oz    Types: 10 Glasses of wine per week     Subjective:  Patient presents with concerns for 2 month history of lower abdominal/ pelvic pain; has been working with her GYN and is scheduled for transvaginal ultrasound next Monday. Patient has been doing some reading and is concerned about "chronic" cystitis. Has been having some pain with intercourse; denies any blood in urine or bleeding after intercourse; wonders if she could have some type of infection- wonders about getting her urine checked. She is s/p hysterectomy for heaving bleeding in her 52s; does not remember having traditional menopause symptoms of night flashes/ sweats; no unexplained bleeding;   Objective:  Vitals:   06/03/17 0903  BP: 112/80  Pulse: 65  Temp: 98 F (36.7 C)  TempSrc: Oral  SpO2: 96%  Weight: 153 lb 1.9 oz (69.5 kg)  Height: 5\' 2"  (1.575 m)    General: Well developed, well nourished, in no acute distress  Skin : Warm and dry.  Head: Normocephalic and atraumatic  Lungs: Respirations unlabored; clear to auscultation bilaterally without wheeze, rales, rhonchi  CVS exam: normal rate and regular rhythm.  Abdomen: Soft; nontender; nondistended; normoactive bowel sounds; no masses or hepatosplenomegaly  Neurologic: Alert and oriented; speech intact; face symmetrical; moves all extremities well; CNII-XII intact without focal deficit   Assessment:  1. Dyspareunia, female   2. Dysuria   3. Pelvic pain     Plan:  Check U/A and urine culture today; start Macrobid 100 mg bid x 7 days; agree with scheduled ultrasound- keep that appointment; refer to urology- ? Interstitial cystitis; may also benefit from topical estrogen therapy; patient is to have her GYN forward notes here for review as well. Follow-up to be determined.    No follow-ups on file.  Orders Placed This Encounter  Procedures  . Ambulatory referral to Urology    Referral Priority:   Routine    Referral Type:   Consultation    Referral Reason:   Specialty Services Required    Requested  Specialty:   Urology    Number of Visits Requested:   1    Requested Prescriptions   Signed Prescriptions Disp Refills  . nitrofurantoin, macrocrystal-monohydrate, (MACROBID) 100 MG capsule 14 capsule 0    Sig: Take 1 capsule (100 mg total) by mouth 2 (two) times daily.

## 2017-06-04 ENCOUNTER — Ambulatory Visit: Payer: Medicare Other | Admitting: Family Medicine

## 2017-06-04 LAB — URINE CULTURE
MICRO NUMBER:: 90646386
SPECIMEN QUALITY:: ADEQUATE

## 2017-06-06 ENCOUNTER — Encounter: Payer: Self-pay | Admitting: Family

## 2017-06-07 ENCOUNTER — Encounter: Payer: Self-pay | Admitting: Gynecology

## 2017-06-08 ENCOUNTER — Telehealth: Payer: Self-pay | Admitting: *Deleted

## 2017-06-08 ENCOUNTER — Ambulatory Visit: Payer: Medicare Other | Admitting: Gynecology

## 2017-06-08 ENCOUNTER — Ambulatory Visit (INDEPENDENT_AMBULATORY_CARE_PROVIDER_SITE_OTHER): Payer: Medicare Other

## 2017-06-08 ENCOUNTER — Encounter: Payer: Self-pay | Admitting: Gynecology

## 2017-06-08 VITALS — BP 118/76

## 2017-06-08 DIAGNOSIS — R195 Other fecal abnormalities: Secondary | ICD-10-CM

## 2017-06-08 DIAGNOSIS — R102 Pelvic and perineal pain: Secondary | ICD-10-CM

## 2017-06-08 MED ORDER — ESTRADIOL 10 MCG VA TABS: 1.0000 | ORAL_TABLET | VAGINAL | 11 refills | Status: DC

## 2017-06-08 NOTE — Patient Instructions (Addendum)
Follow up with gastroenterology as arranged

## 2017-06-08 NOTE — Telephone Encounter (Signed)
-----   Message from Anastasio Auerbach, MD sent at 06/08/2017 11:03 AM EDT ----- Referral to Dr. Leonie Douglas gastroenterology reference patient known to you with upper GI symptoms.

## 2017-06-08 NOTE — Telephone Encounter (Signed)
Patient scheduled on 07/06/17 2 8:15am, noted faxed to [phone], pt aware.

## 2017-06-08 NOTE — Progress Notes (Signed)
    Ellen [name] [birth year] [id number]        67 y.o.  [id number] presents for ultrasound due to pelvic pain.  Recently saw her primary in reference to this had a negative urine analysis.  Patient relates most recently having upper abdominal symptoms of reflux/burping and changes in her stool color now more clay in appearance.  Lastly patient also is noting vaginal dryness and dyspareunia.  Using OTC products although is less than satisfied with these.  Not having significant hot flushes and sweats.  She is being evaluated/treated for some hair loss by dermatology and she was wondering whether HRT would help with this.  Past medical history,surgical history, problem list, medications, allergies, family history and social history were all reviewed and documented in the EPIC chart.  Directed ROS with pertinent positives and negatives documented in the history of present illness/assessment and plan.  Exam: Vitals:   06/08/17 1033  BP: 118/76   General appearance:  Normal Abdomen soft nontender without masses guarding rebound  Ultrasound transabdominal and transvaginal shows right and left ovaries grossly normal.  Vaginal cuff is negative status post hysterectomy.  No evidence of adnexal pathology.  Assessment/Plan:  67 y.o. [id number] with:  1. Pelvic discomfort described as nagging.  Negative urine analysis at her primary physician's office.  Physical exam normal recently in our office.  Ultrasound today is normal.  Recommend initiating follow-up with GI as noted #2. 2. Upper GI symptoms and change in color of her stool.  Number check a baseline comprehensive metabolic panel and amylase.  Recommend patient follow-up with her gastroenterologist Dr. Oletta Lamas for further evaluation and she agrees to do so. 3. Vaginal dryness/dyspareunia.  We discussed options to include global HRT, vaginal HRT and continuing OTC products.  We have discussed the risks versus benefits to include global exposure and the  risks of thrombosis such as stroke heart attack DVT in the breast cancer issue.  At this point the patient wants a trial of vaginal HRT.  Vagifem 10 mcg twice weekly prescribed.  We will follow-up if this continues to be an issue. 4. Hair loss.  She wondered whether global HRT such as patch would help with this.  I discussed with her that this is not a clear indication for HRT alone.  In the absence of hot flushes sweats and other more classic menopausal symptoms do not feel that HRT is going to be of great benefit in reference to this.  Recommended she continue to follow-up with her dermatologist in reference to this.    Anastasio Auerbach MD, 10:57 AM 06/08/2017

## 2017-06-09 ENCOUNTER — Encounter: Payer: Self-pay | Admitting: Gynecology

## 2017-06-09 ENCOUNTER — Other Ambulatory Visit: Payer: Self-pay | Admitting: Physician Assistant

## 2017-06-09 DIAGNOSIS — R1013 Epigastric pain: Secondary | ICD-10-CM

## 2017-06-09 DIAGNOSIS — R1011 Right upper quadrant pain: Secondary | ICD-10-CM

## 2017-06-09 DIAGNOSIS — R110 Nausea: Secondary | ICD-10-CM

## 2017-06-09 LAB — COMPREHENSIVE METABOLIC PANEL
AG Ratio: 1.9 (calc) (ref 1.0–2.5)
ALT: 13 U/L (ref 6–29)
AST: 17 U/L (ref 10–35)
Albumin: 4.5 g/dL (ref 3.6–5.1)
Alkaline phosphatase (APISO): 75 U/L (ref 33–130)
BUN: 12 mg/dL (ref 7–25)
CO2: 24 mmol/L (ref 20–32)
Calcium: 9.3 mg/dL (ref 8.6–10.4)
Chloride: 104 mmol/L (ref 98–110)
Creat: 0.69 mg/dL (ref 0.50–0.99)
Globulin: 2.4 g/dL (calc) (ref 1.9–3.7)
Glucose, Bld: 97 mg/dL (ref 65–99)
Potassium: 4.5 mmol/L (ref 3.5–5.3)
Sodium: 138 mmol/L (ref 135–146)
Total Bilirubin: 0.5 mg/dL (ref 0.2–1.2)
Total Protein: 6.9 g/dL (ref 6.1–8.1)

## 2017-06-09 LAB — AMYLASE: Amylase: 60 U/L (ref 21–101)

## 2017-06-10 ENCOUNTER — Ambulatory Visit
Admission: RE | Admit: 2017-06-10 | Discharge: 2017-06-10 | Disposition: A | Discharge status: Home / Self Care | Payer: Medicare Other | Source: Ambulatory Visit | Attending: Physician Assistant | Admitting: Physician Assistant

## 2017-06-10 DIAGNOSIS — R1013 Epigastric pain: Secondary | ICD-10-CM | POA: Diagnosis not present

## 2017-06-10 DIAGNOSIS — R1011 Right upper quadrant pain: Secondary | ICD-10-CM

## 2017-06-10 DIAGNOSIS — R110 Nausea: Secondary | ICD-10-CM

## 2017-06-12 DIAGNOSIS — R110 Nausea: Secondary | ICD-10-CM | POA: Diagnosis not present

## 2017-06-12 DIAGNOSIS — R1013 Epigastric pain: Secondary | ICD-10-CM | POA: Diagnosis not present

## 2017-06-12 DIAGNOSIS — K2900 Acute gastritis without bleeding: Secondary | ICD-10-CM | POA: Diagnosis not present

## 2017-06-12 DIAGNOSIS — K2930 Chronic superficial gastritis without bleeding: Secondary | ICD-10-CM | POA: Diagnosis not present

## 2017-06-12 DIAGNOSIS — K228 Other specified diseases of esophagus: Secondary | ICD-10-CM | POA: Diagnosis not present

## 2017-06-12 DIAGNOSIS — K449 Diaphragmatic hernia without obstruction or gangrene: Secondary | ICD-10-CM | POA: Diagnosis not present

## 2017-06-15 ENCOUNTER — Encounter: Payer: Self-pay | Admitting: Gynecology

## 2017-06-17 DIAGNOSIS — K2900 Acute gastritis without bleeding: Secondary | ICD-10-CM | POA: Diagnosis not present

## 2017-06-17 DIAGNOSIS — K228 Other specified diseases of esophagus: Secondary | ICD-10-CM | POA: Diagnosis not present

## 2017-07-22 DIAGNOSIS — K2970 Gastritis, unspecified, without bleeding: Secondary | ICD-10-CM | POA: Diagnosis not present

## 2017-07-23 ENCOUNTER — Encounter: Payer: Self-pay | Admitting: Family Medicine

## 2017-07-24 MED ORDER — TRAZODONE HCL 50 MG PO TABS: 25.0000 mg | ORAL_TABLET | Freq: Every evening | ORAL | 3 refills | Status: DC | PRN

## 2017-07-24 MED ORDER — GABAPENTIN 100 MG PO CAPS: 200.0000 mg | ORAL_CAPSULE | Freq: Every day | ORAL | 3 refills | Status: DC

## 2017-08-18 ENCOUNTER — Encounter: Payer: Self-pay | Admitting: Family

## 2017-08-18 ENCOUNTER — Ambulatory Visit: Payer: Medicare Other | Admitting: Family

## 2017-08-18 VITALS — BP 116/76 | HR 66 | Temp 98.5°F | Ht 62.0 in | Wt 154.1 lb

## 2017-08-18 DIAGNOSIS — J019 Acute sinusitis, unspecified: Secondary | ICD-10-CM | POA: Diagnosis not present

## 2017-08-18 DIAGNOSIS — R519 Headache, unspecified: Secondary | ICD-10-CM

## 2017-08-18 DIAGNOSIS — R51 Headache: Secondary | ICD-10-CM | POA: Diagnosis not present

## 2017-08-18 MED ORDER — AMOXICILLIN-POT CLAVULANATE 875-125 MG PO TABS: 1.0000 | ORAL_TABLET | Freq: Two times a day (BID) | ORAL | 0 refills | Status: AC

## 2017-08-18 NOTE — Progress Notes (Signed)
Ellen [name] is a 67 y.o. female with the following history as recorded in EpicCare:  Patient Active Problem List   Diagnosis Date Noted  . Dermatitis 01/01/2017  . Acute MEE (middle ear effusion) 11/10/2016  . Rotator cuff syndrome, right 10/13/2016  . Bacterial sinusitis 08/20/2016  . Greater trochanteric bursitis of right hip 09/24/2015  . Cellulitis and abscess of leg 08/31/2015  . Eosinophilia 03/27/2015  . Neoplasm of uncertain behavior of skin 12/14/2014  . Cough 09/28/2014  . Conjunctivitis 02/13/2014  . Hypothyroidism 01/13/2014  . Dark stools 04/16/2012  . RUQ abdominal pain 04/16/2012  . Cold sore 11/18/2011  . Well adult exam 07/15/2011  . Closed right ankle fracture 07/15/2011  . Hypertension 07/15/2011  . Weight gain 09/18/2010  . TOBACCO USE, QUIT 10/19/2008  . Rash and other nonspecific skin eruption 10/19/2007  . OTITIS MEDIA, CHRONIC SEROUS 08/31/2007  . HYPERLIPIDEMIA 07/28/2007  . GERD 04/13/2007  . SYNCOPE 04/13/2007  . Generalized anxiety disorder 10/16/2006  . INSOMNIA-SLEEP DISORDER-UNSPEC 10/16/2006  . DEPRESSION 10/16/2006  . ALLERGIC RHINITIS 10/16/2006  . 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 Cholecalciferol (VITAMIN D3) 2000 units capsule Take 1 capsule (2,000 Units total) by mouth daily. 100 capsule 3  . citalopram (CELEXA) 20 MG tablet Take 1 tablet (20 mg total) by mouth daily. 90 tablet 3  . Cranberry 1000 MG CAPS Take 1 capsule by mouth daily. Reported on 06/07/2015    . CRANBERRY EXTRACT PO Take by mouth.    . docusate sodium (COLACE) 100 MG capsule Take 100 mg by mouth 2 (two) times daily.    . Estradiol 10 MCG TABS vaginal tablet Place 1 tablet (10 mcg total) vaginally 2 (two) times a week. 8 tablet 11  . finasteride (PROSCAR) 5 MG tablet Take 5 mg by mouth daily.    . fluocinonide (LIDEX) 0.05 % external solution     . fluticasone (FLONASE) 50 MCG/ACT nasal spray Place into  both nostrils daily.    [name] gabapentin (NEURONTIN) 100 MG capsule Take 2 capsules (200 mg total) by mouth at bedtime. 60 capsule 3  . levothyroxine (SYNTHROID, LEVOTHROID) 125 MCG tablet Take 1 tablet (125 mcg total) by mouth 1 day or 1 dose. 90 tablet 3  . Melatonin 3 MG CAPS Take by mouth at bedtime as needed.    . Multiple Vitamin (MULTIVITAMIN) tablet Take 1 tablet by mouth daily. Life's Abundance    . NON FORMULARY Allerfex  1 by mouth once daily as needed    . NON FORMULARY     . pantoprazole (PROTONIX) 40 MG tablet     . traZODone (DESYREL) 50 MG tablet Take 0.5-1 tablets (25-50 mg total) by mouth at bedtime as needed for sleep. 30 tablet 3  . triamcinolone ointment (KENALOG) 0.5 % Apply 1 application topically 2 (two) times daily. 30 g 1  . Valerian Root 100 MG CAPS Take 1 capsule by mouth daily.      [name] zinc gluconate 50 MG tablet Take 50 mg by mouth daily.    [name] Zoster Vaccine Adjuvanted [facility]) injection     . amoxicillin-clavulanate (AUGMENTIN) 875-125 MG tablet Take 1 tablet by mouth 2 (two) times daily for 10 days. 20 tablet 0   No current facility-administered medications for this visit.     Allergies: Belviq [lorcaserin hcl]; Demerol [meperidine]; Elavil [amitriptyline hcl]; Trazodone and nefazodone; Latex; and Skin adhesives  Past Medical History:  Diagnosis Date  .  ALLERGIC RHINITIS   . ANXIETY   . GERD   . INSOMNIA-SLEEP DISORDER-UNSPEC   . Osteopenia 01/2016   T score -1.2 FRAX 15%/0.9%  . Thyroid disease     Past Surgical History:  Procedure Laterality Date  . ANKLE FRACTURE SURGERY  2012   R  . APPENDECTOMY    . BREAST SURGERY     REDUCTION MAMMOPLASTY  . CESAREAN SECTION     twice  . COSMETIC SURGERY    . FACELIFT     s/p  . TONSILLECTOMY  1974  . Upper nl TSH w/sx    . VAGINAL HYSTERECTOMY  1991   TVH, irregular bleeding    Family History  Problem Relation Age of Onset  . Heart disease Father        CAD/CABG in his 59's  . Hypertension Mother    . Heart failure Mother     Social History   Tobacco Use  . Smoking status: Former Research scientist (life sciences)  . Smokeless tobacco: Never Used  Substance Use Topics  . Alcohol use: Yes    Alcohol/week: 10.0 standard drinks    Types: 10 Glasses of wine per week    Subjective:  Patient was treated by endodontist for gum abscess in mid-July; took 10 days of Clindamycin with some benefit; notes that abscess was on lower part of her gum; still having persisting pain in right ear/ right sinus; has seen dentist in follow-up and no further dental issues noted; recommend re-evaluation for sinus infection;  Objective:  Vitals:   08/18/17 1056  BP: 116/76  Pulse: 66  Temp: 98.5 F (36.9 C)  TempSrc: Oral  SpO2: 98%  Weight: 154 lb 1.9 oz (69.9 kg)  Height: 5' 2" (1.575 m)    General: Well developed, well nourished, in no acute distress  Skin : Warm and dry.  Head: Normocephalic and atraumatic  Eyes: Sclera and conjunctiva clear; pupils round and reactive to light; extraocular movements intact  Ears: External normal; canals clear; tympanic membranes congested- mild erythema R TM Oropharynx: Pink, supple. No suspicious lesions  Neck: Supple without thyromegaly, adenopathy  Lungs: Respirations unlabored; clear to auscultation bilaterally without wheeze, rales, rhonchi  CVS exam: normal rate and regular rhythm.  Neurologic: Alert and oriented; speech intact; face symmetrical; moves all extremities well; CNII-XII intact without focal deficit   Assessment:  1. Acute sinusitis, recurrence not specified, unspecified location   2. Acute nonintractable headache, unspecified headache type     Plan:  Rx for Augmentin 875 mg bid x 10 days; recommend to increase her Flonase to bid while on the antibiotics; if symptoms persist after antibiotics completed, follow-up and update labs discussed today; follow-up as needed.  No follow-ups on file.  Orders Placed This Encounter  Procedures  . Sed Rate (ESR)    Standing  Status:   Future    Standing Expiration Date:   08/18/2018  . C-reactive protein    Standing Status:   Future    Standing Expiration Date:   08/18/2018    Requested Prescriptions   Signed Prescriptions Disp Refills  . amoxicillin-clavulanate (AUGMENTIN) 875-125 MG tablet 20 tablet 0    Sig: Take 1 tablet by mouth 2 (two) times daily for 10 days.

## 2017-09-15 ENCOUNTER — Encounter: Payer: Self-pay | Admitting: Family

## 2017-09-28 ENCOUNTER — Encounter: Payer: Medicare Other | Admitting: Gynecology

## 2017-10-02 ENCOUNTER — Encounter: Payer: Self-pay | Admitting: Gynecology

## 2017-10-06 ENCOUNTER — Ambulatory Visit: Payer: Medicare Other | Admitting: Family

## 2017-10-06 ENCOUNTER — Encounter: Payer: Self-pay | Admitting: Family

## 2017-10-06 ENCOUNTER — Ambulatory Visit: Payer: Medicare Other

## 2017-10-06 VITALS — BP 108/68 | HR 86 | Temp 98.4°F | Ht 62.0 in | Wt 155.1 lb

## 2017-10-06 DIAGNOSIS — Z23 Encounter for immunization: Secondary | ICD-10-CM | POA: Diagnosis not present

## 2017-10-06 DIAGNOSIS — J019 Acute sinusitis, unspecified: Secondary | ICD-10-CM | POA: Diagnosis not present

## 2017-10-06 MED ORDER — DOXYCYCLINE HYCLATE 100 MG PO TABS: 100.0000 mg | ORAL_TABLET | Freq: Two times a day (BID) | ORAL | 0 refills | Status: DC

## 2017-10-06 NOTE — Addendum Note (Signed)
Addended by: Marcina Millard on: 10/06/2017 10:30 AM   Modules accepted: Orders

## 2017-10-06 NOTE — Progress Notes (Signed)
Ellen [name] is a 67 y.o. female with the following history as recorded in EpicCare:  Patient Active Problem List   Diagnosis Date Noted  . Dermatitis 01/01/2017  . Acute MEE (middle ear effusion) 11/10/2016  . Rotator cuff syndrome, right 10/13/2016  . Bacterial sinusitis 08/20/2016  . Greater trochanteric bursitis of right hip 09/24/2015  . Cellulitis and abscess of leg 08/31/2015  . Eosinophilia 03/27/2015  . Neoplasm of uncertain behavior of skin 12/14/2014  . Cough 09/28/2014  . Conjunctivitis 02/13/2014  . Hypothyroidism 01/13/2014  . Dark stools 04/16/2012  . RUQ abdominal pain 04/16/2012  . Cold sore 11/18/2011  . Well adult exam 07/15/2011  . Closed right ankle fracture 07/15/2011  . Hypertension 07/15/2011  . Weight gain 09/18/2010  . TOBACCO USE, QUIT 10/19/2008  . Rash and other nonspecific skin eruption 10/19/2007  . OTITIS MEDIA, CHRONIC SEROUS 08/31/2007  . HYPERLIPIDEMIA 07/28/2007  . GERD 04/13/2007  . SYNCOPE 04/13/2007  . Generalized anxiety disorder 10/16/2006  . INSOMNIA-SLEEP DISORDER-UNSPEC 10/16/2006  . DEPRESSION 10/16/2006  . ALLERGIC RHINITIS 10/16/2006  . 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 Cholecalciferol (VITAMIN D3) 2000 units capsule Take 1 capsule (2,000 Units total) by mouth daily. 100 capsule 3  . citalopram (CELEXA) 20 MG tablet Take 1 tablet (20 mg total) by mouth daily. 90 tablet 3  . Cranberry 1000 MG CAPS Take 1 capsule by mouth daily. Reported on 06/07/2015    . CRANBERRY EXTRACT PO Take by mouth.    . docusate sodium (COLACE) 100 MG capsule Take 100 mg by mouth 2 (two) times daily.    . Estradiol 10 MCG TABS vaginal tablet Place 1 tablet (10 mcg total) vaginally 2 (two) times a week. 8 tablet 11  . finasteride (PROSCAR) 5 MG tablet Take 5 mg by mouth daily.    . fluocinonide (LIDEX) 0.05 % external solution     . fluticasone (FLONASE) 50 MCG/ACT nasal spray Place into  both nostrils daily.    [name] gabapentin (NEURONTIN) 100 MG capsule Take 2 capsules (200 mg total) by mouth at bedtime. 60 capsule 3  . levothyroxine (SYNTHROID, LEVOTHROID) 125 MCG tablet Take 1 tablet (125 mcg total) by mouth 1 day or 1 dose. 90 tablet 3  . Multiple Vitamin (MULTIVITAMIN) tablet Take 1 tablet by mouth daily. Life's Abundance    . NON FORMULARY Allerfex  1 by mouth once daily as needed    . NON FORMULARY     . pantoprazole (PROTONIX) 40 MG tablet     . traZODone (DESYREL) 50 MG tablet Take 0.5-1 tablets (25-50 mg total) by mouth at bedtime as needed for sleep. 30 tablet 3  . triamcinolone ointment (KENALOG) 0.5 % Apply 1 application topically 2 (two) times daily. 30 g 1  . Valerian Root 100 MG CAPS Take 1 capsule by mouth daily.      [name] zinc gluconate 50 MG tablet Take 50 mg by mouth daily.    [name] Zoster Vaccine Adjuvanted [facility]) injection     . doxycycline (VIBRA-TABS) 100 MG tablet Take 1 tablet (100 mg total) by mouth 2 (two) times daily. 20 tablet 0  . Melatonin 3 MG CAPS Take by mouth at bedtime as needed.     No current facility-administered medications for this visit.     Allergies: Belviq [lorcaserin hcl]; Demerol [meperidine]; Elavil [amitriptyline hcl]; Latex; and Skin adhesives  Past Medical History:  Diagnosis Date  . ALLERGIC RHINITIS   .  ANXIETY   . GERD   . INSOMNIA-SLEEP DISORDER-UNSPEC   . Osteopenia 01/2016   T score -1.2 FRAX 15%/0.9%  . Thyroid disease     Past Surgical History:  Procedure Laterality Date  . ANKLE FRACTURE SURGERY  2012   R  . APPENDECTOMY    . BREAST SURGERY     REDUCTION MAMMOPLASTY  . CESAREAN SECTION     twice  . COSMETIC SURGERY    . FACELIFT     s/p  . TONSILLECTOMY  1974  . Upper nl TSH w/sx    . VAGINAL HYSTERECTOMY  1991   TVH, irregular bleeding    Family History  Problem Relation Age of Onset  . Heart disease Father        CAD/CABG in his 86's  . Hypertension Mother   . Heart failure Mother     Social  History   Tobacco Use  . Smoking status: Former Research scientist (life sciences)  . Smokeless tobacco: Never Used  Substance Use Topics  . Alcohol use: Yes    Alcohol/week: 10.0 standard drinks    Types: 10 Glasses of wine per week    Subjective:  Patient presents with concerns for right sided ear pain x 2-3 days; worried that she has an ear infection- feels pain radiating down into her throat/ right sided of throat is sore; prone to recurrent ear infection/ sinus infections- especially on right side; took Augmentin x 10 days in August- did feel that infection cleared;    Objective:  Vitals:   10/06/17 1001  BP: 108/68  Pulse: 86  Temp: 98.4 F (36.9 C)  TempSrc: Oral  SpO2: 96%  Weight: 155 lb 1.3 oz (70.3 kg)  Height: 5\' 2"  (1.575 m)    General: Well developed, well nourished, in no acute distress  Skin : Warm and dry.  Head: Normocephalic and atraumatic  Eyes: Sclera and conjunctiva clear; pupils round and reactive to light; extraocular movements intact  Ears: External normal; canals clear; right tympanic membranes congested/ mildly erythematous Oropharynx: Pink, supple. No suspicious lesions  Neck: Supple without thyromegaly, adenopathy  Lungs: Respirations unlabored; clear to auscultation bilaterally without wheeze, rales, rhonchi  CVS exam: normal rate and regular rhythm.  Neurologic: Alert and oriented; speech intact; face symmetrical; moves all extremities well; CNII-XII intact without focal deficit   Assessment:  1. Acute sinusitis, recurrence not specified, unspecified location     Plan:  Rx for Doxycycline 100 mg bid x 10 days; continue Flonase; follow-up worse, no better; may need to have her follow-up with her ENT if symptoms persist.   No follow-ups on file.  No orders of the defined types were placed in this encounter.   Requested Prescriptions   Signed Prescriptions Disp Refills  . doxycycline (VIBRA-TABS) 100 MG tablet 20 tablet 0    Sig: Take 1 tablet (100 mg total) by mouth  2 (two) times daily.

## 2017-10-11 ENCOUNTER — Encounter: Payer: Self-pay | Admitting: Family

## 2017-10-12 ENCOUNTER — Other Ambulatory Visit: Payer: Self-pay | Admitting: Family

## 2017-10-12 MED ORDER — ALPRAZOLAM 0.5 MG PO TABS: 0.5000 mg | ORAL_TABLET | Freq: Every evening | ORAL | 0 refills | Status: DC | PRN

## 2017-10-13 DIAGNOSIS — H10502 Unspecified blepharoconjunctivitis, left eye: Secondary | ICD-10-CM | POA: Diagnosis not present

## 2017-11-13 ENCOUNTER — Other Ambulatory Visit: Payer: Self-pay | Admitting: Family Medicine

## 2017-11-13 NOTE — Telephone Encounter (Signed)
Refill done.  

## 2017-11-16 DIAGNOSIS — H2513 Age-related nuclear cataract, bilateral: Secondary | ICD-10-CM | POA: Diagnosis not present

## 2017-11-16 DIAGNOSIS — H5212 Myopia, left eye: Secondary | ICD-10-CM | POA: Diagnosis not present

## 2017-11-16 DIAGNOSIS — H52203 Unspecified astigmatism, bilateral: Secondary | ICD-10-CM | POA: Diagnosis not present

## 2017-11-16 DIAGNOSIS — H524 Presbyopia: Secondary | ICD-10-CM | POA: Diagnosis not present

## 2017-12-01 ENCOUNTER — Ambulatory Visit: Payer: Medicare Other | Admitting: Family

## 2017-12-01 ENCOUNTER — Encounter: Payer: Self-pay | Admitting: Family

## 2017-12-01 VITALS — BP 110/74 | HR 71 | Temp 98.0°F | Ht 62.0 in | Wt 156.1 lb

## 2017-12-01 DIAGNOSIS — H6980 Other specified disorders of Eustachian tube, unspecified ear: Secondary | ICD-10-CM

## 2017-12-01 DIAGNOSIS — J329 Chronic sinusitis, unspecified: Secondary | ICD-10-CM | POA: Diagnosis not present

## 2017-12-01 MED ORDER — PREDNISONE 20 MG PO TABS: 20.0000 mg | ORAL_TABLET | Freq: Every day | ORAL | 0 refills | Status: DC

## 2017-12-01 MED ORDER — DOXYCYCLINE HYCLATE 100 MG PO TABS: 100.0000 mg | ORAL_TABLET | Freq: Two times a day (BID) | ORAL | 0 refills | Status: DC

## 2017-12-01 NOTE — Progress Notes (Signed)
Ellen [name] is a 67 y.o. female with the following history as recorded in EpicCare:  Patient Active Problem List   Diagnosis Date Noted  . Dermatitis 01/01/2017  . Acute MEE (middle ear effusion) 11/10/2016  . Rotator cuff syndrome, right 10/13/2016  . Bacterial sinusitis 08/20/2016  . Greater trochanteric bursitis of right hip 09/24/2015  . Cellulitis and abscess of leg 08/31/2015  . Eosinophilia 03/27/2015  . Neoplasm of uncertain behavior of skin 12/14/2014  . Cough 09/28/2014  . Conjunctivitis 02/13/2014  . Hypothyroidism 01/13/2014  . Dark stools 04/16/2012  . RUQ abdominal pain 04/16/2012  . Cold sore 11/18/2011  . Well adult exam 07/15/2011  . Closed right ankle fracture 07/15/2011  . Hypertension 07/15/2011  . Weight gain 09/18/2010  . TOBACCO USE, QUIT 10/19/2008  . Rash and other nonspecific skin eruption 10/19/2007  . OTITIS MEDIA, CHRONIC SEROUS 08/31/2007  . HYPERLIPIDEMIA 07/28/2007  . GERD 04/13/2007  . SYNCOPE 04/13/2007  . Generalized anxiety disorder 10/16/2006  . INSOMNIA-SLEEP DISORDER-UNSPEC 10/16/2006  . DEPRESSION 10/16/2006  . ALLERGIC RHINITIS 10/16/2006  . 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 ALPRAZolam (XANAX) 0.5 MG tablet Take 1 tablet (0.5 mg total) by mouth at bedtime as needed for anxiety or sleep. 30 tablet 0  . Cholecalciferol (VITAMIN D3) 2000 units capsule Take 1 capsule (2,000 Units total) by mouth daily. 100 capsule 3  . citalopram (CELEXA) 20 MG tablet Take 1 tablet (20 mg total) by mouth daily. 90 tablet 3  . Cranberry 1000 MG CAPS Take 1 capsule by mouth daily. Reported on 06/07/2015    . CRANBERRY EXTRACT PO Take by mouth.    . Estradiol 10 MCG TABS vaginal tablet Place 1 tablet (10 mcg total) vaginally 2 (two) times a week. 8 tablet 11  . finasteride (PROSCAR) 5 MG tablet Take 5 mg by mouth daily.    . fluocinonide (LIDEX) 0.05 % external solution     . fluticasone  (FLONASE) 50 MCG/ACT nasal spray Place into both nostrils daily.    [name] gabapentin (NEURONTIN) 100 MG capsule TAKE 2 CAPSULES BY MOUTH AT BEDTIME 180 capsule 1  . levothyroxine (SYNTHROID, LEVOTHROID) 125 MCG tablet Take 1 tablet (125 mcg total) by mouth 1 day or 1 dose. 90 tablet 3  . Multiple Vitamin (MULTIVITAMIN) tablet Take 1 tablet by mouth daily. Life's Abundance    . NON FORMULARY Allerfex  1 by mouth once daily as needed    . NON FORMULARY     . pantoprazole (PROTONIX) 20 MG tablet Take 20 mg by mouth daily.  1  . pantoprazole (PROTONIX) 40 MG tablet     . traZODone (DESYREL) 50 MG tablet TAKE 1/2 TO 1 (ONE-HALF TO ONE) TABLET BY MOUTH AT BEDTIME AS NEEDED FOR SLEEP 90 tablet 0  . triamcinolone ointment (KENALOG) 0.5 % Apply 1 application topically 2 (two) times daily. 30 g 1  . Valerian Root 100 MG CAPS Take 1 capsule by mouth daily.      [name] zinc gluconate 50 MG tablet Take 50 mg by mouth daily.    [name] Zoster Vaccine Adjuvanted [facility]) injection     . doxycycline (VIBRA-TABS) 100 MG tablet Take 1 tablet (100 mg total) by mouth 2 (two) times daily. 20 tablet 0  . predniSONE (DELTASONE) 20 MG tablet Take 1 tablet (20 mg total) by mouth daily with breakfast. 5 tablet 0   No current facility-administered medications for this visit.  Allergies: Belviq [lorcaserin hcl]; Demerol [meperidine]; Elavil [amitriptyline hcl]; Latex; and Skin adhesives  Past Medical History:  Diagnosis Date  . ALLERGIC RHINITIS   . ANXIETY   . GERD   . INSOMNIA-SLEEP DISORDER-UNSPEC   . Osteopenia 01/2016   T score -1.2 FRAX 15%/0.9%  . Thyroid disease     Past Surgical History:  Procedure Laterality Date  . ANKLE FRACTURE SURGERY  2012   R  . APPENDECTOMY    . BREAST SURGERY     REDUCTION MAMMOPLASTY  . CESAREAN SECTION     twice  . COSMETIC SURGERY    . FACELIFT     s/p  . TONSILLECTOMY  1974  . Upper nl TSH w/sx    . VAGINAL HYSTERECTOMY  1991   TVH, irregular bleeding    Family  History  Problem Relation Age of Onset  . Heart disease Father        CAD/CABG in his 84's  . Hypertension Mother   . Heart failure Mother     Social History   Tobacco Use  . Smoking status: Former Research scientist (life sciences)  . Smokeless tobacco: Never Used  Substance Use Topics  . Alcohol use: Yes    Alcohol/week: 10.0 standard drinks    Types: 10 Glasses of wine per week    Subjective:  Patient presents with concerns for right ear pain x 5 days; concerned for another sinus infection; nown history of ETD- saw ENT earlier this year; feels like developing another sinus infection; requesting referral to Dr. Benjamine Mola to discuss chronic sinus issues/ possible surgical options;     Objective:  Vitals:   12/01/17 1115  BP: 110/74  Pulse: 71  Temp: 98 F (36.7 C)  TempSrc: Oral  SpO2: 97%  Weight: 156 lb 1.9 oz (70.8 kg)  Height: 5\' 2"  (1.575 m)    General: Well developed, well nourished, in no acute distress  Skin : Warm and dry.  Head: Normocephalic and atraumatic  Eyes: Sclera and conjunctiva clear; pupils round and reactive to light; extraocular movements intact  Ears: External normal; canals clear; tympanic membranes congested bilaterally Oropharynx: Pink, supple. No suspicious lesions  Neck: Supple without thyromegaly, adenopathy  Lungs: Respirations unlabored; clear to auscultation bilaterally without wheeze, rales, rhonchi  CVS exam: normal rate and regular rhythm.  Neurologic: Alert and oriented; speech intact; face symmetrical; moves all extremities well; CNII-XII intact without focal deficit   Assessment:  1. Chronic Eustachian tube dysfunction, unspecified laterality   2. Recurrent sinusitis     Plan:  Agree ENT referral is warranted- updated as requested; in the interim, re-treat with Doxycycline and Prednisone; increase fluids, rest and follow-up worse, no better.   No follow-ups on file.  Orders Placed This Encounter  Procedures  . Ambulatory referral to ENT    Referral  Priority:   Routine    Referral Type:   Consultation    Referral Reason:   Specialty Services Required    Referred to Provider:   Leta Baptist, MD    Requested Specialty:   Otolaryngology    Number of Visits Requested:   1    Requested Prescriptions   Signed Prescriptions Disp Refills  . doxycycline (VIBRA-TABS) 100 MG tablet 20 tablet 0    Sig: Take 1 tablet (100 mg total) by mouth 2 (two) times daily.  . predniSONE (DELTASONE) 20 MG tablet 5 tablet 0    Sig: Take 1 tablet (20 mg total) by mouth daily with breakfast.

## 2017-12-09 ENCOUNTER — Encounter: Payer: Self-pay | Admitting: Family

## 2017-12-10 ENCOUNTER — Other Ambulatory Visit: Payer: Self-pay | Admitting: Family

## 2017-12-10 MED ORDER — LEVOFLOXACIN 500 MG PO TABS: 500.0000 mg | ORAL_TABLET | Freq: Every day | ORAL | 0 refills | Status: DC

## 2017-12-28 ENCOUNTER — Encounter: Payer: Medicare Other | Admitting: Gynecology

## 2018-01-07 ENCOUNTER — Ambulatory Visit (INDEPENDENT_AMBULATORY_CARE_PROVIDER_SITE_OTHER): Payer: Medicare Other | Admitting: Family Medicine

## 2018-01-07 ENCOUNTER — Encounter: Payer: Self-pay | Admitting: Family Medicine

## 2018-01-07 VITALS — BP 124/86 | HR 66 | Temp 98.2°F | Resp 16 | Wt 151.0 lb

## 2018-01-07 DIAGNOSIS — J329 Chronic sinusitis, unspecified: Secondary | ICD-10-CM

## 2018-01-07 MED ORDER — PREDNISONE 10 MG PO TABS: ORAL_TABLET | ORAL | 0 refills | Status: DC

## 2018-01-07 NOTE — Progress Notes (Signed)
Patient ID: Ellen [name], female   DOB: 1951-01-03, 68 y.o.   MRN: [id number]  PCP: Cassandria Anger, MD  Subjective:  Ellen [name] is a 68 y.o. year old very pleasant female patient who presents with smptoms including nasal congestion with "crusty nose", sinus pressure,   -started: approximately one month ago and Improved after Levaquin and then sinus pressure returned 4 days ago, symptoms have improved but sinus pressure remains and she is concerned as she will be taking care of grandchildren for the next 5 days.  -previous treatments: Nasal saline spray and compresses have provided benefit. Fexofenadine daily for allergic rhinitis have provided benefit. -sick contacts/travel/risks: denies flu exposure. No recent sick contact exposure -Hx of: allergies  ROS-denies fever, SOB, NVD, tooth pain  Pertinent Past Medical History- HTN, Bacterial sinusitis, allergic rhinitis, GERD, Eosinophilia  Treated on 12/01/17 for chronic eustachian tube dysfunction and recurrent sinusitis. She was started on doxycyline and switched to levaquin for symptoms and advised to follow up with allergist.  She is [facility] on 01/14/17.  Former smoker   Medications- reviewed  Current Outpatient Medications  Medication Sig Dispense Refill  . ALPRAZolam (XANAX) 0.5 MG tablet Take 1 tablet (0.5 mg total) by mouth at bedtime as needed for anxiety or sleep. 30 tablet 0  . Cholecalciferol (VITAMIN D3) 2000 units capsule Take 1 capsule (2,000 Units total) by mouth daily. 100 capsule 3  . citalopram (CELEXA) 20 MG tablet Take 1 tablet (20 mg total) by mouth daily. 90 tablet 3  . Cranberry 1000 MG CAPS Take 1 capsule by mouth daily. Reported on 06/07/2015    . CRANBERRY EXTRACT PO Take by mouth.    . Estradiol 10 MCG TABS vaginal tablet Place 1 tablet (10 mcg total) vaginally 2 (two) times a week. 8 tablet 11  . finasteride (PROSCAR) 5 MG tablet Take 5 mg by mouth daily.    . fluocinonide (LIDEX) 0.05 % external  solution     . fluticasone (FLONASE) 50 MCG/ACT nasal spray Place into both nostrils daily.    [name] gabapentin (NEURONTIN) 100 MG capsule TAKE 2 CAPSULES BY MOUTH AT BEDTIME 180 capsule 1  . levothyroxine (SYNTHROID, LEVOTHROID) 125 MCG tablet Take 1 tablet (125 mcg total) by mouth 1 day or 1 dose. 90 tablet 3  . Multiple Vitamin (MULTIVITAMIN) tablet Take 1 tablet by mouth daily. Life's Abundance    . NON FORMULARY Allerfex  1 by mouth once daily as needed    . NON FORMULARY     . pantoprazole (PROTONIX) 20 MG tablet Take 20 mg by mouth daily.  1  . pantoprazole (PROTONIX) 40 MG tablet     . predniSONE (DELTASONE) 20 MG tablet Take 1 tablet (20 mg total) by mouth daily with breakfast. 5 tablet 0  . traZODone (DESYREL) 50 MG tablet TAKE 1/2 TO 1 (ONE-HALF TO ONE) TABLET BY MOUTH AT BEDTIME AS NEEDED FOR SLEEP 90 tablet 0  . Valerian Root 100 MG CAPS Take 1 capsule by mouth daily.      [name] zinc gluconate 50 MG tablet Take 50 mg by mouth daily.    [name] Zoster Vaccine Adjuvanted [facility]) injection      No current facility-administered medications for this visit.     Objective: BP 124/86   Pulse 66   Temp 98.2 F (36.8 C) (Oral)   Resp 16   Wt 151 lb (68.5 kg)   SpO2 98%   BMI 27.62 kg/m  Gen: NAD, resting comfortably  HEENT: Turbinates mildy erythematous, TMs normal bilaterally, oropharynx is clear and moist, positive maxillary sinus pressure noted with minimal tenderness present CV: RRR no murmurs rubs or gallops Lungs: CTAB no crackles, wheeze, rhonchi Abdomen: soft/nontender/nondistended/normal bowel sounds. No rebound or guarding.  Ext: no edema Skin: warm, dry, no rash Neuro: grossly normal, moves all extremities  Assessment/Plan: 1. Recurrent sinusitis Symptoms are most consistent with sinus pressure and allergic rhinitis. Symptom of sinus pressure is most concerning for patient. Will treat with a prednisone taper, advise nasal saline rinses, and she will follow up with ENT as  scheduled in one week. She completed levaquin and noted improvement and VSS which are reassuring today.   We discussed that we did not find any infection that had higher probability of being bacterial such as pneumonia or strep throat. We discussed signs that bacterial infection may have developed particularly fever or shortness of breath.    Finally, we reviewed reasons to return to care including if symptoms worsen or persist or new concerns arise- once again particularly shortness of breath or fever.   Laurita Quint, FNP

## 2018-01-07 NOTE — Patient Instructions (Signed)
It was a pleasure to meet you today!  Please switch to nasal saline rinses and take medication with food.  Follow up with Dr. Benjamine Mola has scheduled.    Sinusitis, Adult Sinusitis is soreness and swelling (inflammation) of your sinuses. Sinuses are hollow spaces in the bones around your face. They are located:  Around your eyes.  In the middle of your forehead.  Behind your nose.  In your cheekbones. Your sinuses and nasal passages are lined with a fluid called mucus. Mucus drains out of your sinuses. Swelling can trap mucus in your sinuses. This lets germs (bacteria, virus, or fungus) grow, which leads to infection. Most of the time, this condition is caused by a virus. What are the causes? This condition is caused by:  Allergies.  Asthma.  Germs.  Things that block your nose or sinuses.  Growths in the nose (nasal polyps).  Chemicals or irritants in the air.  Fungus (rare). What increases the risk? You are more likely to develop this condition if:  You have a weak body defense system (immune system).  You do a lot of swimming or diving.  You use nasal sprays too much.  You smoke. What are the signs or symptoms? The main symptoms of this condition are pain and a feeling of pressure around the sinuses. Other symptoms include:  Stuffy nose (congestion).  Runny nose (drainage).  Swelling and warmth in the sinuses.  Headache.  Toothache.  A cough that may get worse at night.  Mucus that collects in the throat or the back of the nose (postnasal drip).  Being unable to smell and taste.  Being very tired (fatigue).  A fever.  Sore throat.  Bad breath. How is this diagnosed? This condition is diagnosed based on:  Your symptoms.  Your medical history.  A physical exam.  Tests to find out if your condition is short-term (acute) or long-term (chronic). Your doctor may: ? Check your nose for growths (polyps). ? Check your sinuses using a tool that  has a light (endoscope). ? Check for allergies or germs. ? Do imaging tests, such as an MRI or CT scan. How is this treated? Treatment for this condition depends on the cause and whether it is short-term or long-term.  If caused by a virus, your symptoms should go away on their own within 10 days. You may be given medicines to relieve symptoms. They include: ? Medicines that shrink swollen tissue in the nose. ? Medicines that treat allergies (antihistamines). ? A spray that treats swelling of the nostrils. ? Rinses that help get rid of thick mucus in your nose (nasal saline washes).  If caused by bacteria, your doctor may wait to see if you will get better without treatment. You may be given antibiotic medicine if you have: ? A very bad infection. ? A weak body defense system.  If caused by growths in the nose, you may need to have surgery. Follow these instructions at home: Medicines  Take, use, or apply over-the-counter and prescription medicines only as told by your doctor. These may include nasal sprays.  If you were prescribed an antibiotic medicine, take it as told by your doctor. Do not stop taking the antibiotic even if you start to feel better. Hydrate and humidify   Drink enough water to keep your pee (urine) pale yellow.  Use a cool mist humidifier to keep the humidity level in your home above 50%.  Breathe in steam for 10-15 minutes, 3-4 times a  day, or as told by your doctor. You can do this in the bathroom while a hot shower is running.  Try not to spend time in cool or dry air. Rest  Rest as much as you can.  Sleep with your head raised (elevated).  Make sure you get enough sleep each night. General instructions   Put a warm, moist washcloth on your face 3-4 times a day, or as often as told by your doctor. This will help with discomfort.  Wash your hands often with soap and water. If there is no soap and water, use hand sanitizer.  Do not smoke. Avoid  being around people who are smoking (secondhand smoke).  Keep all follow-up visits as told by your doctor. This is important. Contact a doctor if:  You have a fever.  Your symptoms get worse.  Your symptoms do not get better within 10 days. Get help right away if:  You have a very bad headache.  You cannot stop throwing up (vomiting).  You have very bad pain or swelling around your face or eyes.  You have trouble seeing.  You feel confused.  Your neck is stiff.  You have trouble breathing. Summary  Sinusitis is swelling of your sinuses. Sinuses are hollow spaces in the bones around your face.  This condition is caused by tissues in your nose that become inflamed or swollen. This traps germs. These can lead to infection.  If you were prescribed an antibiotic medicine, take it as told by your doctor. Do not stop taking it even if you start to feel better.  Keep all follow-up visits as told by your doctor. This is important. This information is not intended to replace advice given to you by your health care provider. Make sure you discuss any questions you have with your health care provider. Document Released: 06/11/2007 Document Revised: 05/25/2017 Document Reviewed: 05/25/2017 Elsevier Interactive Patient Education  2019 [employer].

## 2018-01-08 ENCOUNTER — Encounter: Payer: Medicare Other | Admitting: Gynecology

## 2018-01-12 ENCOUNTER — Encounter: Payer: Self-pay | Admitting: Gynecology

## 2018-01-12 DIAGNOSIS — Z1231 Encounter for screening mammogram for malignant neoplasm of breast: Secondary | ICD-10-CM | POA: Diagnosis not present

## 2018-01-12 DIAGNOSIS — Z8262 Family history of osteoporosis: Secondary | ICD-10-CM | POA: Diagnosis not present

## 2018-01-12 DIAGNOSIS — M8588 Other specified disorders of bone density and structure, other site: Secondary | ICD-10-CM | POA: Diagnosis not present

## 2018-01-12 DIAGNOSIS — Z780 Asymptomatic menopausal state: Secondary | ICD-10-CM | POA: Diagnosis not present

## 2018-01-13 ENCOUNTER — Encounter: Payer: Medicare Other | Admitting: Gynecology

## 2018-01-14 ENCOUNTER — Other Ambulatory Visit: Payer: Self-pay | Admitting: Otolaryngology

## 2018-01-14 ENCOUNTER — Encounter: Payer: Self-pay | Admitting: Gynecology

## 2018-01-14 ENCOUNTER — Ambulatory Visit (INDEPENDENT_AMBULATORY_CARE_PROVIDER_SITE_OTHER): Payer: Medicare Other | Admitting: Otolaryngology

## 2018-01-14 DIAGNOSIS — J342 Deviated nasal septum: Secondary | ICD-10-CM | POA: Diagnosis not present

## 2018-01-14 DIAGNOSIS — J310 Chronic rhinitis: Secondary | ICD-10-CM | POA: Diagnosis not present

## 2018-01-14 DIAGNOSIS — J329 Chronic sinusitis, unspecified: Secondary | ICD-10-CM

## 2018-01-14 DIAGNOSIS — J343 Hypertrophy of nasal turbinates: Secondary | ICD-10-CM

## 2018-01-15 ENCOUNTER — Other Ambulatory Visit: Payer: Self-pay | Admitting: Internal Medicine

## 2018-01-15 ENCOUNTER — Encounter: Payer: Self-pay | Admitting: Gynecology

## 2018-01-15 ENCOUNTER — Ambulatory Visit (INDEPENDENT_AMBULATORY_CARE_PROVIDER_SITE_OTHER): Payer: Medicare Other | Admitting: Gynecology

## 2018-01-15 VITALS — BP 118/76 | Ht 62.0 in | Wt 158.0 lb

## 2018-01-15 DIAGNOSIS — N952 Postmenopausal atrophic vaginitis: Secondary | ICD-10-CM | POA: Diagnosis not present

## 2018-01-15 DIAGNOSIS — Z01419 Encounter for gynecological examination (general) (routine) without abnormal findings: Secondary | ICD-10-CM | POA: Diagnosis not present

## 2018-01-15 DIAGNOSIS — M8580 Other specified disorders of bone density and structure, unspecified site: Secondary | ICD-10-CM | POA: Diagnosis not present

## 2018-01-15 MED ORDER — ESTRADIOL 10 MCG VA TABS: 1.0000 | ORAL_TABLET | VAGINAL | 11 refills | Status: DC

## 2018-01-15 NOTE — Progress Notes (Signed)
    Ifrah Vest 1950-05-07 [id number]        68 y.o.  [id number] for annual gynecologic exam.  Doing well without gynecologic complaints  Past medical history,surgical history, problem list, medications, allergies, family history and social history were all reviewed and documented as reviewed in the EPIC chart.  ROS:  Performed with pertinent positives and negatives included in the history, assessment and plan.   Additional significant findings : None   Exam: [name] assistant Vitals:   01/15/18 0801  BP: 118/76  Weight: 158 lb (71.7 kg)  Height: 5\' 2"  (1.575 m)   Body mass index is 28.9 kg/m.  General appearance:  Normal affect, orientation and appearance. Skin: Grossly normal HEENT: Without gross lesions.  No cervical or supraclavicular adenopathy. Thyroid normal.  Lungs:  Clear without wheezing, rales or rhonchi Cardiac: RR, without RMG Abdominal:  Soft, nontender, without masses, guarding, rebound, organomegaly or hernia Breasts:  Examined lying and sitting without masses, retractions, discharge or axillary adenopathy. Pelvic:  Ext, BUS, Vagina: With atrophic changes  Adnexa: Without masses or tenderness    Anus and perineum: Normal   Rectovaginal: Normal sphincter tone without palpated masses or tenderness.    Assessment/Plan:  68 y.o. [id number] female for annual gynecologic exam.   1. Postmenopausal/atrophic genital changes.  Status post TVH in 1991 for bleeding.  Using Vagifem twice weekly.  Doing well with this and wants to continue.  We have discussed the issues of absorption and systemic risks.  Patient is comfortable continuing.  Refill x1 year provided. 2. Colonoscopy 2011.  Repeat at their recommended interval.  Actively seeing gastroenterology for GERD. 3. Pap smear 2018.  No Pap smear done today.  No history of abnormal Pap smears.  Options to stop screening per current screening guidelines based on age and hysterectomy history reviewed.  Will readdress on  an annual basis. 4. Osteopenia.  DEXA 2020 T score -2.3 at the distal third of the radius.  Normal values at the spine and right and left hips.  We discussed the issues of wrist measurements versus central values.  Patient active and otherwise healthy.  We will plan on repeat DEXA in 2 years. 5. Mammography 01/2018.  Continue with annual mammography next year.  Breast exam normal today. 6. Health maintenance.  No routine lab work done as patient does this elsewhere.  Follow-up 1 year, sooner as needed.   Anastasio Auerbach MD, 8:43 AM 01/15/2018

## 2018-01-15 NOTE — Patient Instructions (Signed)
Follow-up in 1 year for annual exam, sooner as needed. 

## 2018-01-18 ENCOUNTER — Ambulatory Visit
Admission: RE | Admit: 2018-01-18 | Discharge: 2018-01-18 | Disposition: A | Discharge status: Home / Self Care | Payer: Medicare Other | Source: Ambulatory Visit | Attending: Otolaryngology | Admitting: Otolaryngology

## 2018-01-18 DIAGNOSIS — J329 Chronic sinusitis, unspecified: Secondary | ICD-10-CM

## 2018-01-18 DIAGNOSIS — J320 Chronic maxillary sinusitis: Secondary | ICD-10-CM | POA: Diagnosis not present

## 2018-01-18 IMAGING — CT CT MAXILLOFACIAL W/O CM
3 of 4 series · 13 of 47 positions shown, 15 images · non-contrast
Comparison: Coronal sinus CT [DATE]

CLINICAL DATA: Chronic sinusitis.

EXAM:
CT MAXILLOFACIAL WITHOUT CONTRAST
TECHNIQUE: Multidetector CT images of the paranasal sinuses were obtained using
the standard protocol without intravenous contrast.

[Series 2: sinus 2.00 hr60 s3 ax · axial · 0.43mm/px · z∈[-532,-406]mm · 7 of 75 slices shown, 9 images]
[im 6/75  brain]
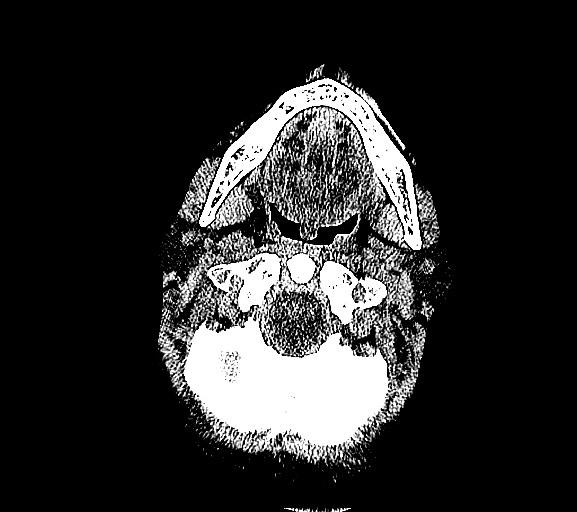
[im 6/75  bone]
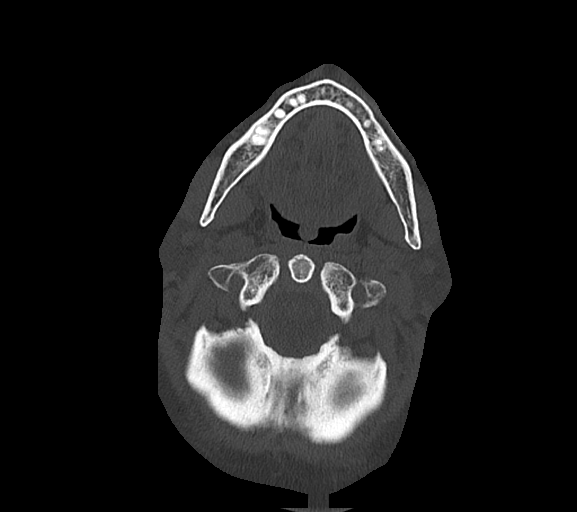
[im 16/75  bone]
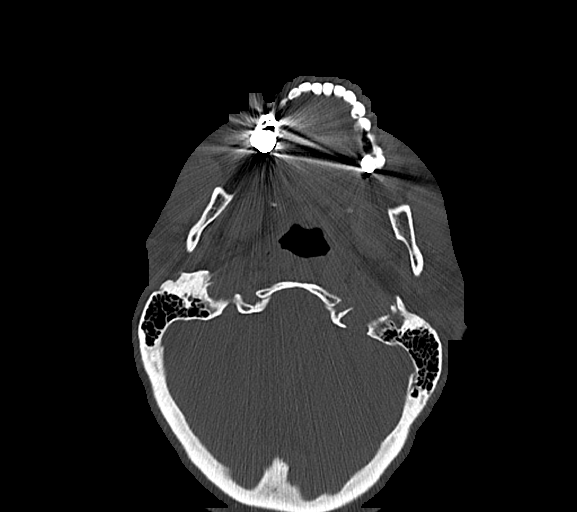
[im 27/75  bone]
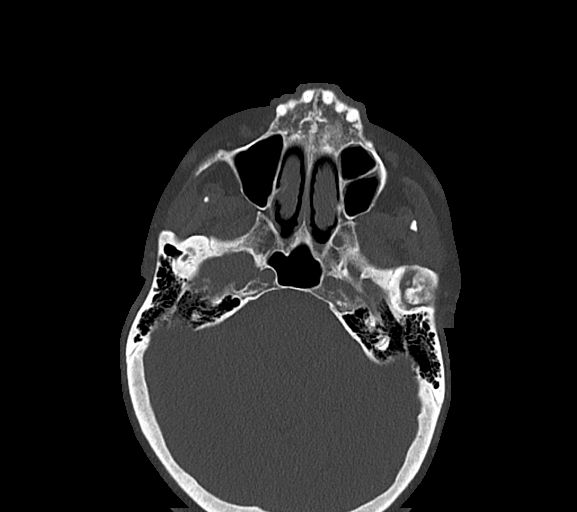
[im 38/75  bone]
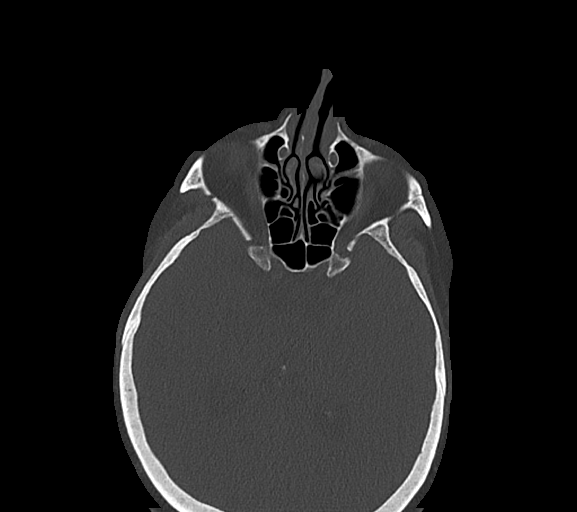
[im 48/75  brain]
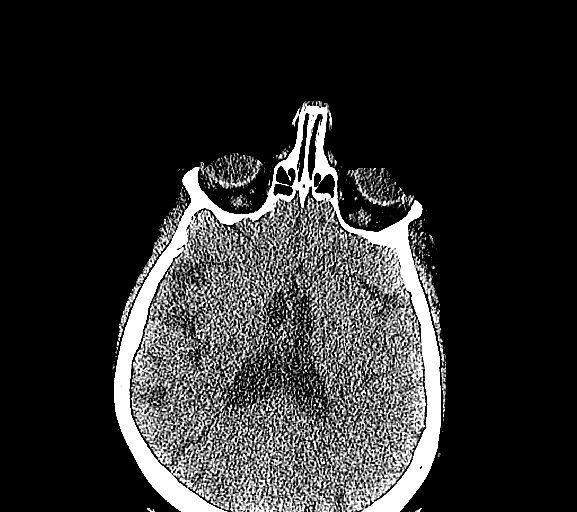
[im 48/75  bone]
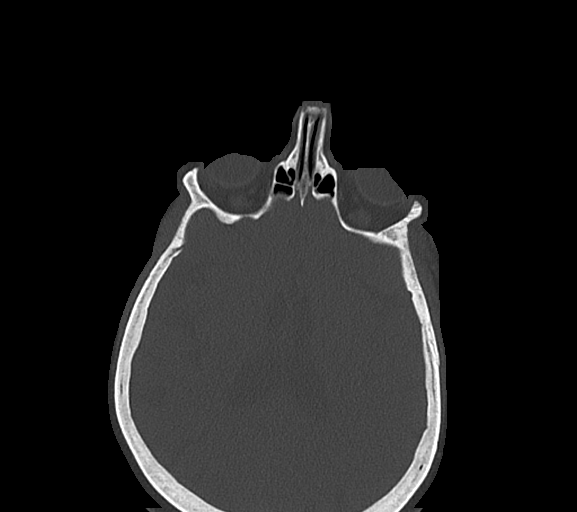
[im 59/75  bone]
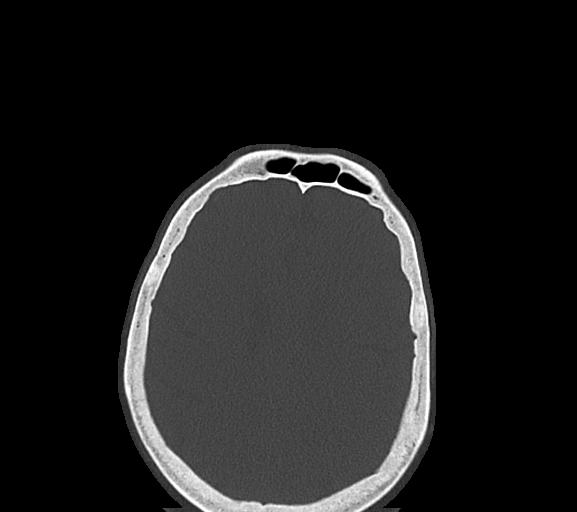
[im 69/75  bone]
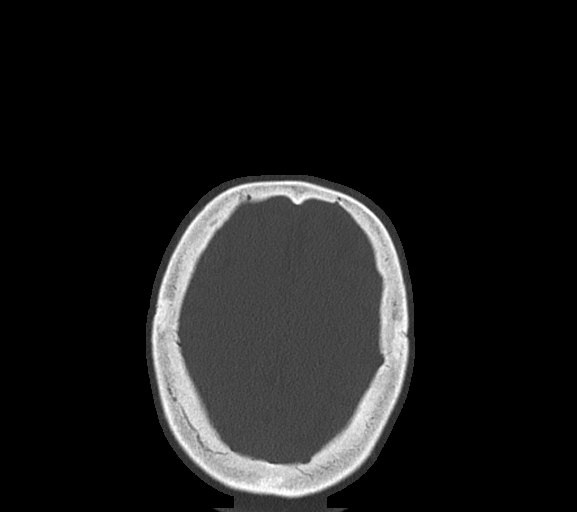

[Series 4: sinus 2.00 hr60 s3 cor · coronal · 0.31mm/px · 3 of 109 slices shown]
[im 37/109  bone]
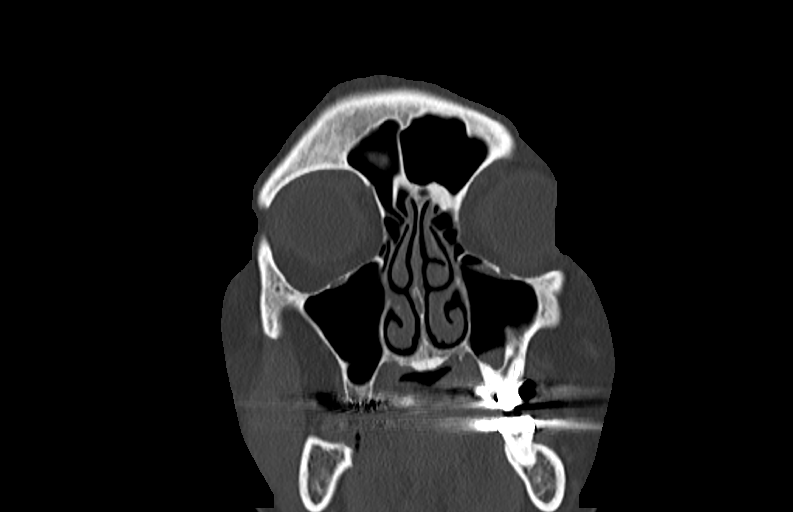
[im 49/109  bone]
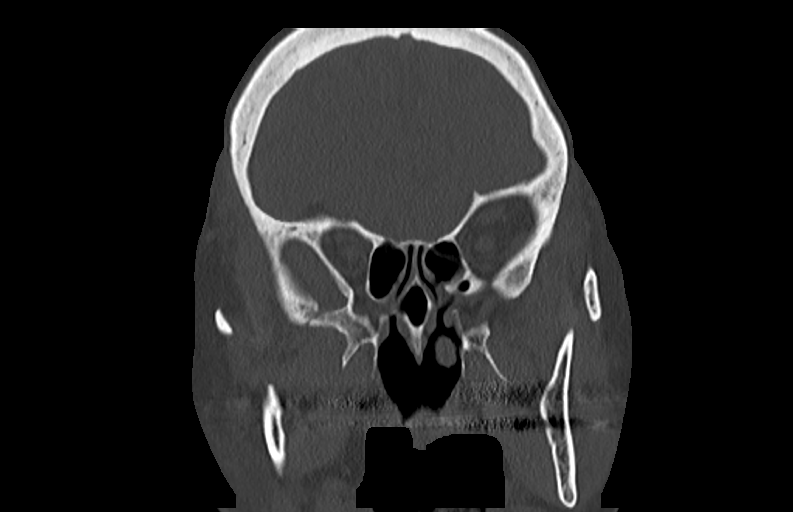
[im 61/109  bone]
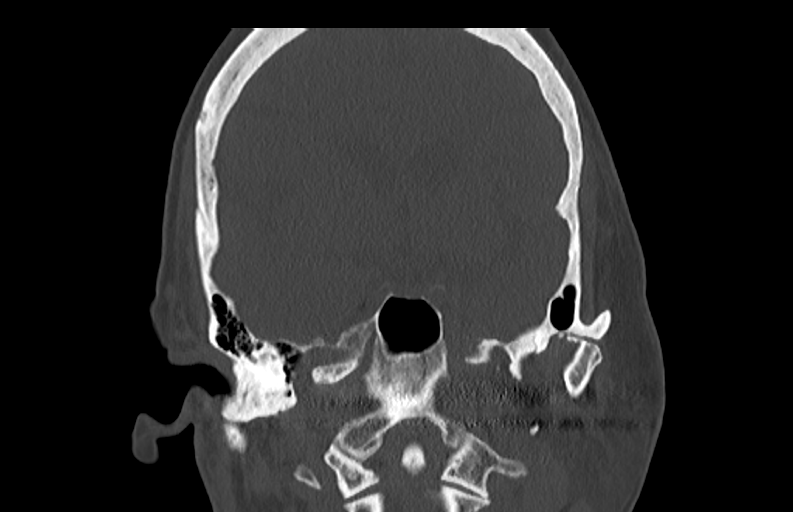

[Series 6: sinus 2.00 hr60 s3 sag · sagittal · 0.31mm/px · 3 of 122 slices shown]
[im 41/122  bone]
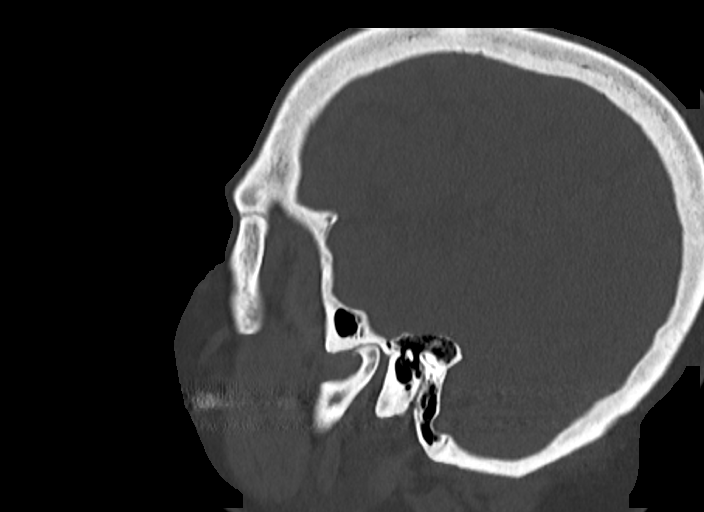
[im 61/122  bone]
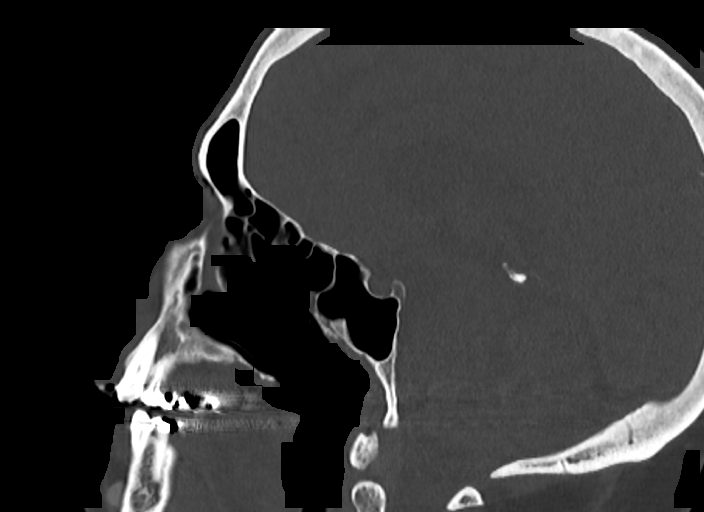
[im 81/122  bone]
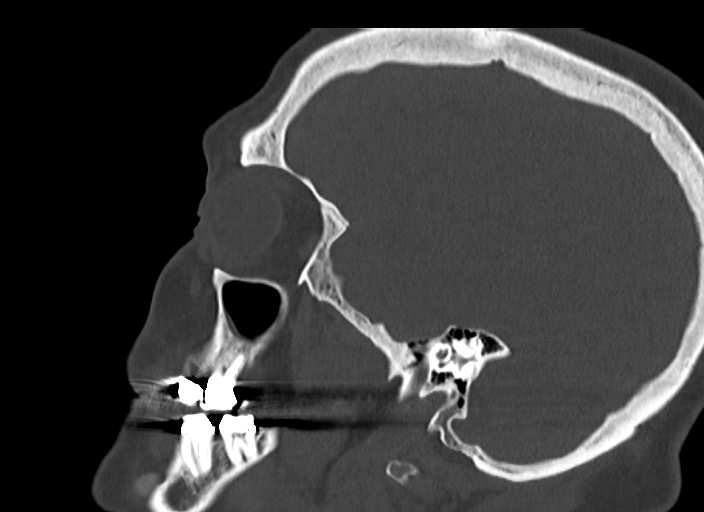

[13 of 47 positions shown; findings below may reference images not displayed]

FINDINGS: Paranasal sinuses:

Frontal: Normally aerated. Patent frontal sinus drainage pathways.

Ethmoid: Normally aerated.

Maxillary: Minimal left greater than right maxillary sinus mucosal
thickening primarily involving the left alveolar recess. No fluid.

Sphenoid: Normally aerated. Patent sphenoethmoidal recesses.

Right ostiomeatal unit: Patent.

Left ostiomeatal unit: Patent.

Nasal passages: Patent. 4 mm rightward nasal septal deviation.
Paradoxical rotation of the middle turbinates.

Anatomy: No pneumatization superior to anterior ethmoid notches.
Keros II/III. Sellar sphenoid pneumatization pattern. No dehiscence
of carotid or optic canals. No onodi cell.

Other: Clear mastoid air cells and tympanic cavities. The visualized
portion of the brain is unremarkable.

Trace gas within the subcutaneous tissues of the right forehead and
right medial canthal region of uncertain etiology and significance.
No other abnormal venous or orbital gas seen elsewhere in the
included face or head.
IMPRESSION: Minimal mucosal thickening in the maxillary sinuses.  No fluid.

## 2018-02-01 ENCOUNTER — Encounter: Payer: Self-pay | Admitting: Internal Medicine

## 2018-02-01 ENCOUNTER — Ambulatory Visit (INDEPENDENT_AMBULATORY_CARE_PROVIDER_SITE_OTHER): Payer: Medicare Other | Admitting: Internal Medicine

## 2018-02-01 ENCOUNTER — Other Ambulatory Visit (INDEPENDENT_AMBULATORY_CARE_PROVIDER_SITE_OTHER): Payer: Medicare Other

## 2018-02-01 VITALS — BP 126/80 | HR 68 | Temp 98.0°F | Ht 62.0 in | Wt 159.0 lb

## 2018-02-01 DIAGNOSIS — Z23 Encounter for immunization: Secondary | ICD-10-CM

## 2018-02-01 DIAGNOSIS — I10 Essential (primary) hypertension: Secondary | ICD-10-CM

## 2018-02-01 DIAGNOSIS — Z0000 Encounter for general adult medical examination without abnormal findings: Secondary | ICD-10-CM

## 2018-02-01 DIAGNOSIS — E785 Hyperlipidemia, unspecified: Secondary | ICD-10-CM

## 2018-02-01 LAB — CBC WITH DIFFERENTIAL/PLATELET
Basophils Absolute: 0.1 10*3/uL (ref 0.0–0.1)
Basophils Relative: 1.1 % (ref 0.0–3.0)
Eosinophils Absolute: 0.3 10*3/uL (ref 0.0–0.7)
Eosinophils Relative: 5 % (ref 0.0–5.0)
HCT: 41.9 % (ref 36.0–46.0)
Hemoglobin: 14 g/dL (ref 12.0–15.0)
Lymphocytes Relative: 37.3 % (ref 12.0–46.0)
Lymphs Abs: 2.2 10*3/uL (ref 0.7–4.0)
MCHC: 33.3 g/dL (ref 30.0–36.0)
MCV: 92 fl (ref 78.0–100.0)
Monocytes Absolute: 0.7 10*3/uL (ref 0.1–1.0)
Monocytes Relative: 11.3 % (ref 3.0–12.0)
Neutro Abs: 2.7 10*3/uL (ref 1.4–7.7)
Neutrophils Relative %: 45.3 % (ref 43.0–77.0)
Platelets: 205 10*3/uL (ref 150.0–400.0)
RBC: 4.55 Mil/uL (ref 3.87–5.11)
RDW: 13.5 % (ref 11.5–15.5)
WBC: 5.9 10*3/uL (ref 4.0–10.5)

## 2018-02-01 LAB — URINALYSIS, ROUTINE W REFLEX MICROSCOPIC
Bilirubin Urine: NEGATIVE
Hgb urine dipstick: NEGATIVE
Ketones, ur: NEGATIVE
Nitrite: NEGATIVE
RBC / HPF: NONE SEEN (ref 0–?)
Specific Gravity, Urine: 1.01 (ref 1.000–1.030)
Total Protein, Urine: NEGATIVE
Urine Glucose: NEGATIVE
Urobilinogen, UA: 0.2 (ref 0.0–1.0)
pH: 6 (ref 5.0–8.0)

## 2018-02-01 LAB — HEPATIC FUNCTION PANEL
ALT: 16 U/L (ref 0–35)
AST: 19 U/L (ref 0–37)
Albumin: 4.3 g/dL (ref 3.5–5.2)
Alkaline Phosphatase: 77 U/L (ref 39–117)
Bilirubin, Direct: 0.1 mg/dL (ref 0.0–0.3)
Total Bilirubin: 0.4 mg/dL (ref 0.2–1.2)
Total Protein: 7 g/dL (ref 6.0–8.3)

## 2018-02-01 LAB — LIPID PANEL
Cholesterol: 222 mg/dL — ABNORMAL HIGH (ref 0–200)
HDL: 74.8 mg/dL (ref >39.00)
LDL Cholesterol: 119 mg/dL — ABNORMAL HIGH (ref 0–99)
NonHDL: 146.8
Total CHOL/HDL Ratio: 3
Triglycerides: 137 mg/dL (ref 0.0–149.0)
VLDL: 27.4 mg/dL (ref 0.0–40.0)

## 2018-02-01 LAB — BASIC METABOLIC PANEL
BUN: 20 mg/dL (ref 6–23)
CO2: 26 mEq/L (ref 19–32)
Calcium: 9.2 mg/dL (ref 8.4–10.5)
Chloride: 102 mEq/L (ref 96–112)
Creatinine, Ser: 0.78 mg/dL (ref 0.40–1.20)
GFR: 73.15 mL/min (ref >60.00)
Glucose, Bld: 96 mg/dL (ref 70–99)
Potassium: 4.2 mEq/L (ref 3.5–5.1)
Sodium: 137 mEq/L (ref 135–145)

## 2018-02-01 LAB — TSH: TSH: 1.32 u[IU]/mL (ref 0.35–4.50)

## 2018-02-01 NOTE — Progress Notes (Signed)
Subjective:  Patient ID: Ellen [name], female    DOB: 1950-07-03  Age: 68 y.o. MRN: [id number]  CC: No chief complaint on file.   HPI Ellen [name] presents for a well exam C/o recurrent sinus infections  Outpatient Medications Prior to Visit  Medication Sig Dispense Refill  . Cholecalciferol (VITAMIN D3) 2000 units capsule Take 1 capsule (2,000 Units total) by mouth daily. 100 capsule 3  . citalopram (CELEXA) 20 MG tablet Take 1 tablet (20 mg total) by mouth daily. (Patient taking differently: Take 10 mg by mouth daily. ) 90 tablet 3  . CRANBERRY EXTRACT PO Take by mouth.    . Estradiol 10 MCG TABS vaginal tablet Place 1 tablet (10 mcg total) vaginally 2 (two) times a week. 8 tablet 11  . fluticasone (FLONASE) 50 MCG/ACT nasal spray Place into both nostrils daily.    [name] gabapentin (NEURONTIN) 100 MG capsule TAKE 2 CAPSULES BY MOUTH AT BEDTIME 180 capsule 1  . levothyroxine (SYNTHROID, LEVOTHROID) 125 MCG tablet TAKE 1 TABLET BY MOUTH ONCE DAILY 90 tablet 0  . Multiple Vitamin (MULTIVITAMIN) tablet Take 1 tablet by mouth daily. Life's Abundance    . NON FORMULARY Allerfex  1 by mouth once daily as needed    . NON FORMULARY     . pantoprazole (PROTONIX) 20 MG tablet Take 20 mg by mouth daily.  1  . traZODone (DESYREL) 50 MG tablet TAKE 1/2 TO 1 (ONE-HALF TO ONE) TABLET BY MOUTH AT BEDTIME AS NEEDED FOR SLEEP 90 tablet 0  . Valerian Root 100 MG CAPS Take 1 capsule by mouth daily.      [name] zinc gluconate 50 MG tablet Take 50 mg by mouth daily.    [name] Zoster Vaccine Adjuvanted [facility]) injection     . Cranberry 1000 MG CAPS Take 1 capsule by mouth daily. Reported on 06/07/2015    . fluocinonide (LIDEX) 0.05 % external solution     . predniSONE (DELTASONE) 10 MG tablet Take 4 tablets daily for 4 days, 3 tabs daily for 2 days, 2 tabs daily for 2 days, and one tab daily for 2 days. 28 tablet 0   No facility-administered medications prior to visit.     ROS: Review of Systems    Constitutional: Negative for activity change, appetite change, chills, fatigue and unexpected weight change.  HENT: Negative for congestion, mouth sores and sinus pressure.   Eyes: Negative for visual disturbance.  Respiratory: Negative for cough and chest tightness.   Gastrointestinal: Negative for abdominal pain and nausea.  Genitourinary: Negative for difficulty urinating, frequency and vaginal pain.  Musculoskeletal: Negative for back pain and gait problem.  Skin: Negative for pallor and rash.  Neurological: Negative for dizziness, tremors, weakness, numbness and headaches.  Psychiatric/Behavioral: Negative for confusion, sleep disturbance and suicidal ideas.    Objective:  BP 126/80 (BP Location: Left Arm, Patient Position: Sitting, Cuff Size: Normal)   Pulse 68   Temp 98 F (36.7 C) (Oral)   Ht 5\' 2"  (1.575 m)   Wt 159 lb (72.1 kg)   SpO2 97%   BMI 29.08 kg/m   BP Readings from Last 3 Encounters:  02/01/18 126/80  01/15/18 118/76  01/07/18 124/86    Wt Readings from Last 3 Encounters:  02/01/18 159 lb (72.1 kg)  01/15/18 158 lb (71.7 kg)  01/07/18 151 lb (68.5 kg)    Physical Exam Constitutional:      General: She is not in acute distress.    Appearance:  She is well-developed.  HENT:     Head: Normocephalic.     Right Ear: External ear normal.     Left Ear: External ear normal.     Nose: Nose normal.  Eyes:     General:        Right eye: No discharge.        Left eye: No discharge.     Conjunctiva/sclera: Conjunctivae normal.     Pupils: Pupils are equal, round, and reactive to light.  Neck:     Musculoskeletal: Normal range of motion and neck supple.     Thyroid: No thyromegaly.     Vascular: No JVD.     Trachea: No tracheal deviation.  Cardiovascular:     Rate and Rhythm: Normal rate and regular rhythm.     Heart sounds: Normal heart sounds.  Pulmonary:     Effort: No respiratory distress.     Breath sounds: No stridor. No wheezing.  Abdominal:      General: Bowel sounds are normal. There is no distension.     Palpations: Abdomen is soft. There is no mass.     Tenderness: There is no abdominal tenderness. There is no guarding or rebound.  Musculoskeletal:        General: No tenderness.  Lymphadenopathy:     Cervical: No cervical adenopathy.  Skin:    Findings: No erythema or rash.  Neurological:     Cranial Nerves: No cranial nerve deficit.     Motor: No abnormal muscle tone.     Coordination: Coordination normal.     Deep Tendon Reflexes: Reflexes normal.  Psychiatric:        Behavior: Behavior normal.        Thought Content: Thought content normal.        Judgment: Judgment normal.     Lab Results  Component Value Date   WBC 6.0 01/01/2017   HGB 14.0 01/01/2017   HCT 42.5 01/01/2017   PLT 201.0 01/01/2017   GLUCOSE 97 06/08/2017   CHOL 206 (H) 01/01/2017   TRIG 144.0 01/01/2017   HDL 62.80 01/01/2017   LDLDIRECT 142.6 07/09/2011   LDLCALC 115 (H) 01/01/2017   ALT 13 06/08/2017   AST 17 06/08/2017   NA 138 06/08/2017   K 4.5 06/08/2017   CL 104 06/08/2017   CREATININE 0.69 06/08/2017   BUN 12 06/08/2017   CO2 24 06/08/2017   TSH 0.84 01/01/2017   HGBA1C 5.6 10/12/2006    Ct Maxillofacial Wo Contrast  Result Date: 01/18/2018 CLINICAL DATA:  Chronic sinusitis. EXAM: CT MAXILLOFACIAL WITHOUT CONTRAST TECHNIQUE: Multidetector CT images of the paranasal sinuses were obtained using the standard protocol without intravenous contrast. COMPARISON:  Coronal sinus CT 02/26/2007 FINDINGS: Paranasal sinuses: Frontal: Normally aerated. Patent frontal sinus drainage pathways. Ethmoid: Normally aerated. Maxillary: Minimal left greater than right maxillary sinus mucosal thickening primarily involving the left alveolar recess. No fluid. Sphenoid: Normally aerated. Patent sphenoethmoidal recesses. Right ostiomeatal unit: Patent. Left ostiomeatal unit: Patent. Nasal passages: Patent. 4 mm rightward nasal septal deviation.  Paradoxical rotation of the middle turbinates. Anatomy: No pneumatization superior to anterior ethmoid notches. Keros II/III. Sellar sphenoid pneumatization pattern. No dehiscence of carotid or optic canals. No onodi cell. Other: Clear mastoid air cells and tympanic cavities. The visualized portion of the brain is unremarkable. Trace gas within the subcutaneous tissues of the right forehead and right medial canthal region of uncertain etiology and significance. No other abnormal venous or orbital gas seen elsewhere in the  included face or head. IMPRESSION: Minimal mucosal thickening in the maxillary sinuses.  No fluid. Electronically Signed   By: Logan Bores M.D.   On: 01/18/2018 15:20    Assessment & Plan:   There are no diagnoses linked to this encounter.   No orders of the defined types were placed in this encounter.    Follow-up: No follow-ups on file.  Walker Kehr, MD

## 2018-02-01 NOTE — Assessment & Plan Note (Addendum)
Here for medicare wellness/physical  Diet: heart healthy  Physical activity: not sedentary  Depression/mood screen: negative  Hearing: intact to whispered voice  Visual acuity: grossly normal, performs annual eye exam  ADLs: capable  Fall risk: low to none  Home safety: good  Cognitive evaluation: intact to orientation, naming, recall and repetition  EOL planning: adv directives, full code/ I agree  I have personally reviewed and have noted  1. The patient's medical, surgical and social history  2. Their use of alcohol, tobacco or illicit drugs  3. Their current medications and supplements  4. The patient's functional ability including ADL's, fall risks, home safety risks and hearing or visual impairment.  5. Diet and physical activities  6. Evidence for depression or mood disorders 7. The roster of all physicians providing medical care to patient - is listed in the Snapshot section of the chart and reviewed today.    Today patient counseled on age appropriate routine health concerns for screening and prevention, each reviewed and up to date or declined. Immunizations reviewed and up to date or declined. Labs ordered and reviewed. Risk factors for depression reviewed and negative. Hearing function and visual acuity are intact. ADLs screened and addressed as needed. Functional ability and level of safety reviewed and appropriate. Education, counseling and referrals performed based on assessed risks today. Patient provided with a copy of personalized plan for preventive services.       Offered cardiac CT scan for calcium scoring 1/20

## 2018-02-01 NOTE — Addendum Note (Signed)
Addended by: Karren Cobble on: 02/01/2018 08:39 AM   Modules accepted: Orders

## 2018-02-01 NOTE — Patient Instructions (Signed)

## 2018-02-01 NOTE — Assessment & Plan Note (Signed)
Labs

## 2018-02-01 NOTE — Assessment & Plan Note (Signed)
Offered cardiac CT scan for calcium scoring 

## 2018-02-02 ENCOUNTER — Other Ambulatory Visit: Payer: Self-pay | Admitting: Internal Medicine

## 2018-02-02 MED ORDER — CEFUROXIME AXETIL 250 MG PO TABS: 250.0000 mg | ORAL_TABLET | Freq: Two times a day (BID) | ORAL | 0 refills | Status: AC

## 2018-02-03 DIAGNOSIS — D485 Neoplasm of uncertain behavior of skin: Secondary | ICD-10-CM | POA: Diagnosis not present

## 2018-02-03 DIAGNOSIS — L820 Inflamed seborrheic keratosis: Secondary | ICD-10-CM | POA: Diagnosis not present

## 2018-02-03 DIAGNOSIS — L98499 Non-pressure chronic ulcer of skin of other sites with unspecified severity: Secondary | ICD-10-CM | POA: Diagnosis not present

## 2018-02-03 DIAGNOSIS — L821 Other seborrheic keratosis: Secondary | ICD-10-CM | POA: Diagnosis not present

## 2018-02-03 DIAGNOSIS — D225 Melanocytic nevi of trunk: Secondary | ICD-10-CM | POA: Diagnosis not present

## 2018-02-03 DIAGNOSIS — L814 Other melanin hyperpigmentation: Secondary | ICD-10-CM | POA: Diagnosis not present

## 2018-02-10 DIAGNOSIS — J310 Chronic rhinitis: Secondary | ICD-10-CM | POA: Diagnosis not present

## 2018-02-10 DIAGNOSIS — J320 Chronic maxillary sinusitis: Secondary | ICD-10-CM | POA: Diagnosis not present

## 2018-02-10 DIAGNOSIS — J343 Hypertrophy of nasal turbinates: Secondary | ICD-10-CM | POA: Diagnosis not present

## 2018-02-10 DIAGNOSIS — H9209 Otalgia, unspecified ear: Secondary | ICD-10-CM | POA: Diagnosis not present

## 2018-03-11 ENCOUNTER — Other Ambulatory Visit: Payer: Self-pay | Admitting: Family Medicine

## 2018-03-15 ENCOUNTER — Telehealth: Payer: Self-pay | Admitting: Internal Medicine

## 2018-03-15 MED ORDER — CITALOPRAM HYDROBROMIDE 20 MG PO TABS: 20.0000 mg | ORAL_TABLET | Freq: Every day | ORAL | 1 refills | Status: DC

## 2018-03-15 MED ORDER — LEVOTHYROXINE SODIUM 125 MCG PO TABS: 125.0000 ug | ORAL_TABLET | Freq: Every day | ORAL | 1 refills | Status: DC

## 2018-03-15 NOTE — Telephone Encounter (Signed)
Requested medication (s) are due for refill today: yes  Requested medication (s) are on the active medication list: yes  Last refill:  Last refilled by Dr. Tamala Julian  Future visit scheduled: yes  Notes to clinic:  Last refilled by Dr. Tamala Julian. LOV on 02/01/18 with Dr. Alain Marion    Requested Prescriptions  Pending Prescriptions Disp Refills   gabapentin (NEURONTIN) 100 MG capsule 180 capsule 1    Sig: Take 2 capsules (200 mg total) by mouth at bedtime.     Neurology: Anticonvulsants - gabapentin Passed - 03/15/2018  4:01 PM      Passed - Valid encounter within last 12 months    Recent Outpatient Visits          1 month ago Well adult exam   [place] Plotnikov, Evie Lacks, MD   2 months ago Recurrent sinusitis   [place] Kordsmeier, Gregary Signs, [place]   3 months ago Chronic Eustachian tube dysfunction, unspecified laterality   [place], Marvis Repress, FNP   5 months ago Acute sinusitis, recurrence not specified, unspecified location   [place], Marvis Repress, [place]   6 months ago Acute sinusitis, recurrence not specified, unspecified location   [place], Marvis Repress, FNP      Future Appointments            In 10 months Plotnikov, Evie Lacks, MD [facility]          traZODone (DESYREL) 50 MG tablet 90 tablet 0    Sig: TAKE 1/2 TO 1 (ONE-HALF TO ONE) TABLET BY MOUTH AT BEDTIME AS NEEDED FOR SLEEP     Psychiatry: Antidepressants - Serotonin Modulator Failed - 03/15/2018  4:01 PM      Failed - Completed PHQ-2 or PHQ-9 in the last 360 days.      Passed - Valid encounter within last 6 months    Recent Outpatient Visits          1 month ago Well adult exam   [place] Plotnikov, Evie Lacks, MD   2 months ago Recurrent sinusitis   [place]  Kordsmeier, Gregary Signs, FNP   3 months ago Chronic Eustachian tube dysfunction, unspecified laterality   [place], Marvis Repress, FNP   5 months ago Acute sinusitis, recurrence not specified, unspecified location   [place], Marvis Repress, FNP   6 months ago Acute sinusitis, recurrence not specified, unspecified location   [place], Marvis Repress, FNP      Future Appointments            In 10 months Plotnikov, Evie Lacks, MD [facility], [place]         Signed Prescriptions Disp Refills   citalopram (CELEXA) 20 MG tablet 90 tablet 1    Sig: Take 1 tablet (20 mg total) by mouth daily.     Psychiatry:  Antidepressants - SSRI Failed - 03/15/2018  4:01 PM      Failed - Completed PHQ-2 or PHQ-9 in the last 360 days.      Passed - Valid encounter within last 6 months    Recent Outpatient Visits          1 month ago Well adult exam   [facility] Plotnikov, Evie Lacks, MD  2 months ago Recurrent sinusitis   [place] Kordsmeier, Gregary Signs, FNP   3 months ago Chronic Eustachian tube dysfunction, unspecified laterality   [place], Marvis Repress, FNP   5 months ago Acute sinusitis, recurrence not specified, unspecified location   [place], Marvis Repress, FNP   6 months ago Acute sinusitis, recurrence not specified, unspecified location   [place], Marvis Repress, FNP      Future Appointments            In 10 months Plotnikov, Evie Lacks, MD [facility]          levothyroxine (SYNTHROID, LEVOTHROID) 125 MCG tablet 90 tablet 1    Sig: Take 1 tablet (125 mcg total) by mouth daily.     Endocrinology:  Hypothyroid Agents Failed - 03/15/2018  4:01 PM      Failed - TSH needs  to be rechecked within 3 months after an abnormal result. Refill until TSH is due.      Passed - TSH in normal range and within 360 days    TSH  Date Value Ref Range Status  02/01/2018 1.32 0.35 - 4.50 uIU/mL Final         Passed - Valid encounter within last 12 months    Recent Outpatient Visits          1 month ago Well adult exam   [place] Plotnikov, Evie Lacks, MD   2 months ago Recurrent sinusitis   [place] Kordsmeier, [place], [place]   3 months ago Chronic Eustachian tube dysfunction, unspecified laterality   [place], Marvis Repress, FNP   5 months ago Acute sinusitis, recurrence not specified, unspecified location   [place], Marvis Repress, [place]   6 months ago Acute sinusitis, recurrence not specified, unspecified location   [place], Marvis Repress, [place]      Future Appointments            In 10 months Plotnikov, Evie Lacks, MD [place], [place]

## 2018-03-15 NOTE — Telephone Encounter (Signed)
Pt called regarding medication refill request. Pt advised that refills of Gabapentin should not be due until 05/2018 and Levothyroxyine refill would note be due until 04/16/18. Pt advised to contact pharmacy for future refill request so that electronic request could se sent to PCP. Pt verbalized understanding. Gabapentin and Trazodone previously filled by Dr. Tamala Julian which pt states was before Dr. Tamala Julian was just seeing sports medicine patients.

## 2018-03-15 NOTE — Telephone Encounter (Signed)
Copied from [place] [phone] [phone]. Topic: Quick Communication - See Telephone Encounter >> Mar 15, 2018 11:10 AM Loma Boston wrote: CRM for notification. See Telephone encounter for: 03/15/18.gabapentin (NEURONTIN) 100 MG capsule/citalopram (CELEXA) 20 MG tablet / traZODone (DESYREL) 50 MG tablet /levothyroxine (SYNTHROID, LEVOTHROID) 125 MCG These 4 meds (FOUR) pt says She had her CPE on 02/01/2018 with Dr Mamie Nick and he failed to call in meds as he mentioned. She needs these (4) called in to

## 2018-03-19 NOTE — Telephone Encounter (Signed)
Per chart refills already done on 03/15/18.Marland KitchenJohny [name]

## 2018-03-20 ENCOUNTER — Other Ambulatory Visit: Payer: Self-pay | Admitting: Family Medicine

## 2018-03-22 NOTE — Telephone Encounter (Signed)
plot

## 2018-03-23 ENCOUNTER — Other Ambulatory Visit: Payer: Self-pay | Admitting: Internal Medicine

## 2018-03-23 MED ORDER — GABAPENTIN 100 MG PO CAPS: 200.0000 mg | ORAL_CAPSULE | Freq: Every day | ORAL | 1 refills | Status: DC

## 2018-03-23 MED ORDER — TRAZODONE HCL 50 MG PO TABS: ORAL_TABLET | ORAL | 1 refills | Status: DC

## 2018-04-28 ENCOUNTER — Telehealth: Payer: Medicare Other | Admitting: Family

## 2018-04-28 DIAGNOSIS — J069 Acute upper respiratory infection, unspecified: Secondary | ICD-10-CM | POA: Diagnosis not present

## 2018-04-28 MED ORDER — BENZONATATE 100 MG PO CAPS: 100.0000 mg | ORAL_CAPSULE | Freq: Three times a day (TID) | ORAL | 0 refills | Status: DC | PRN

## 2018-04-28 MED ORDER — FLUTICASONE PROPIONATE 50 MCG/ACT NA SUSP: 2.0000 | Freq: Every day | NASAL | 2 refills | Status: DC

## 2018-04-28 NOTE — Progress Notes (Signed)
We are sorry you are not feeling well.  Here is how we plan to help!  Based on what you have shared with me, it looks like you may have a viral upper respiratory infection.  Upper respiratory infections are caused by a large number of viruses; however, rhinovirus is the most common cause.   Symptoms vary from person to person, with common symptoms including sore throat, cough, and fatigue or lack of energy.  A low-grade fever of up to 100.4 may present, but is often uncommon.  Symptoms vary however, and are closely related to a person's age or underlying illnesses.  The most common symptoms associated with an upper respiratory infection are nasal discharge or congestion, cough, sneezing, headache and pressure in the ears and face.  These symptoms usually persist for about 3 to 10 days, but can last up to 2 weeks.  It is important to know that upper respiratory infections do not cause serious illness or complications in most cases.    Upper respiratory infections can be transmitted from person to person, with the most common method of transmission being a person's hands.  The virus is able to live on the skin and can infect other persons for up to 2 hours after direct contact.  Also, these can be transmitted when someone coughs or sneezes; thus, it is important to cover the mouth to reduce this risk.  To keep the spread of the illness at [place], good hand hygiene is very important.  This is an infection that is most likely caused by a virus. There are no specific treatments other than to help you with the symptoms until the infection runs its course.  We are sorry you are not feeling well.  Here is how we plan to help!   For nasal congestion, you may use an oral decongestants such as Mucinex D or if you have glaucoma or high blood pressure use plain Mucinex.  Saline nasal spray or nasal drops can help and can safely be used as often as needed for congestion.  For your congestion, I have prescribed Fluticasone  nasal spray one spray in each nostril twice a day.  Approximately 5 minutes was spent documenting and reviewing patient's chart.    If you do not have a history of heart disease, hypertension, diabetes or thyroid disease, prostate/bladder issues or glaucoma, you may also use Sudafed to treat nasal congestion.  It is highly recommended that you consult with a pharmacist or your primary care physician to ensure this medication is safe for you to take.     If you have a cough, you may use cough suppressants such as Delsym and Robitussin.  If you have glaucoma or high blood pressure, you can also use Coricidin HBP.   For cough I have prescribed for you A prescription cough medication called Tessalon Perles 100 mg. You may take 1-2 capsules every 8 hours as needed for cough.  If you have a sore or scratchy throat, use a saltwater gargle-  to  teaspoon of salt dissolved in a 4-ounce to 8-ounce glass of warm water.  Gargle the solution for approximately 15-30 seconds and then spit.  It is important not to swallow the solution.  You can also use throat lozenges/cough drops and Chloraseptic spray to help with throat pain or discomfort.  Warm or cold liquids can also be helpful in relieving throat pain.  For headache, pain or general discomfort, you can use Ibuprofen or Tylenol as directed.   Some  authorities believe that zinc sprays or the use of Echinacea may shorten the course of your symptoms.   HOME CARE . Only take medications as instructed by your medical team. . Be sure to drink plenty of fluids. Water is fine as well as fruit juices, sodas and electrolyte beverages. You may want to stay away from caffeine or alcohol. If you are nauseated, try taking small sips of liquids. How do you know if you are getting enough fluid? Your urine should be a pale yellow or almost colorless. . Get rest. . Taking a steamy shower or using a humidifier may help nasal congestion and ease sore throat pain. You can  place a towel over your head and breathe in the steam from hot water coming from a faucet. . Using a saline nasal spray works much the same way. . Cough drops, hard candies and sore throat lozenges may ease your cough. . Avoid close contacts especially the very young and the elderly . Cover your mouth if you cough or sneeze . Always remember to wash your hands.   GET HELP RIGHT AWAY IF: . You develop worsening fever. . If your symptoms do not improve within 10 days . You develop yellow or green discharge from your nose over 3 days. . You have coughing fits . You develop a severe head ache or visual changes. . You develop shortness of breath, difficulty breathing or start having chest pain . Your symptoms persist after you have completed your treatment plan  MAKE SURE YOU   Understand these instructions.  Will watch your condition.  Will get help right away if you are not doing well or get worse.  Your e-visit answers were reviewed by a board certified advanced clinical practitioner to complete your personal care plan. Depending upon the condition, your plan could have included both over the counter or prescription medications. Please review your pharmacy choice. If there is a problem, you may call our nursing hot line at and have the prescription routed to another pharmacy. Your safety is important to [place]. If you have drug allergies check your prescription carefully.   You can use MyChart to ask questions about today's visit, request a non-urgent call back, or ask for a work or school excuse for 24 hours related to this e-Visit. If it has been greater than 24 hours you will need to follow up with your provider, or enter a new e-Visit to address those concerns. You will get an e-mail in the next two days asking about your experience.  I hope that your e-visit has been valuable and will speed your recovery. Thank you for using e-visits.

## 2018-05-14 ENCOUNTER — Telehealth: Payer: Self-pay | Admitting: Internal Medicine

## 2018-05-14 NOTE — Telephone Encounter (Signed)
Patient called and says she sent a MyChart message about this to Dr. Alain Marion on yesterday. She says her son is in [place] on active [place] duty and has to have surgery. She says her and her husband will need to go there to help care for the grandchildren and the surgeon wants to make sure no one has the coronavirus. She's asking is there somewhere she and her husband can go get tested. I advised I'm not sure of where a drive up testing site is and if she does go, she will need an order from Dr. Alain Marion. I advised I will send this request to him and someone will call back early next week with his recommendation.

## 2018-05-17 NOTE — Telephone Encounter (Signed)
Pt given number to set up appt with health dept at [facility]

## 2018-06-08 ENCOUNTER — Ambulatory Visit: Payer: Self-pay

## 2018-06-08 ENCOUNTER — Ambulatory Visit (INDEPENDENT_AMBULATORY_CARE_PROVIDER_SITE_OTHER): Payer: Medicare Other | Admitting: Family Medicine

## 2018-06-08 ENCOUNTER — Encounter: Payer: Self-pay | Admitting: Family Medicine

## 2018-06-08 ENCOUNTER — Other Ambulatory Visit: Payer: Self-pay

## 2018-06-08 VITALS — BP 114/70 | HR 71 | Ht 62.0 in | Wt 160.0 lb

## 2018-06-08 DIAGNOSIS — M79672 Pain in left foot: Secondary | ICD-10-CM

## 2018-06-08 DIAGNOSIS — G5762 Lesion of plantar nerve, left lower limb: Secondary | ICD-10-CM | POA: Diagnosis not present

## 2018-06-08 MED ORDER — GABAPENTIN 300 MG PO CAPS: 300.0000 mg | ORAL_CAPSULE | Freq: Three times a day (TID) | ORAL | 3 refills | Status: DC

## 2018-06-08 NOTE — Assessment & Plan Note (Signed)
Patient given injection today.  Tolerated the procedure well.  We discussed gabapentin and increased dosing to 300 mg.  Patient was doing very well otherwise.  We discussed icing regimen and home exercises.  We discussed proper shoes and over-the-counter orthotics.  Patient will come back and see me again 6 weeks if not completely resolved.  At that point we will consider custom orthotics and formal physical therapy

## 2018-06-08 NOTE — Progress Notes (Signed)
[name] [address], [place] [address] Phone: [phone] Subjective:   I, Kandace Blitz, am serving as a scribe for Dr. Hulan Saas.    CC: Left foot pain new problem  OHY:[id number]  Ellen [name] is a 68 y.o. female coming in with complaint of left foot pain. States that she know has issues with her foot in certain shoes. Numbness and tingling. Increased her shoe size by 1/1.5. Would like to increase her gabapentin to 300mg .   Onset- Chronic (about 30 years) Location - left foot, 3rd toe Duration-  Character- Aggravating factors- stepping incorrectly (fast walking) Reliving factors- gabapentin, tylenol  Therapies tried-  Severity-7 out of 10 sometimes.     Past Medical History:  Diagnosis Date   ALLERGIC RHINITIS    ANXIETY    GERD    INSOMNIA-SLEEP DISORDER-UNSPEC    Osteopenia 01/2016   T score -1.2 FRAX 15%/0.9%   Shingles    Thyroid disease    Past Surgical History:  Procedure Laterality Date   ANKLE FRACTURE SURGERY  2012   R   APPENDECTOMY     BREAST SURGERY     REDUCTION MAMMOPLASTY   CESAREAN SECTION     twice   COSMETIC SURGERY     FACELIFT     s/p   TONSILLECTOMY  1974   Upper nl TSH w/sx     VAGINAL HYSTERECTOMY  1991   TVH, irregular bleeding   Social History   Socioeconomic History   Marital status: Married    Spouse name: Not on file   Number of children: 2   Years of education: Not on file   Highest education level: Not on file  Occupational History   Occupation: Retired    Fish farm manager: SELF EMPLOYED  Social Needs   Emergency planning/management officer strain: Not on file   Food insecurity:    Worry: Not on file    Inability: Not on file   Transportation needs:    Medical: Not on file    Non-medical: Not on file  Tobacco Use   Smoking status: Former Smoker   Smokeless tobacco: Never Used  Substance and Sexual Activity   Alcohol use: Yes    Alcohol/week: 10.0 standard  drinks    Types: 10 Glasses of wine per week   Drug use: No   Sexual activity: Yes    Birth control/protection: Post-menopausal, Surgical    Comment: HYST-1st intercourse 68 yo-Fewer than 5 partners  Lifestyle   Physical activity:    Days per week: Not on file    Minutes per session: Not on file   Stress: Not on file  Relationships   Social connections:    Talks on phone: Not on file    Gets together: Not on file    Attends religious service: Not on file    Active member of club or organization: Not on file    Attends meetings of clubs or organizations: Not on file    Relationship status: Not on file  Other Topics Concern   Not on file  Social History Narrative   Regular exercise   2 biol children-1 disabled with schizophrenia & 1 stepchild   Allergies  Allergen Reactions   Belviq [Lorcaserin Hcl]     palpitations   Demerol [Meperidine]     palpitations   Elavil [Amitriptyline Hcl]     constipation   Latex Hives, Itching and Rash    Allergy  To latex, hives , redness,  itching ,rash   Skin Adhesives Hives, Itching and Rash   Family History  Problem Relation Age of Onset   Heart disease Father        CAD/CABG in his 30's   Hypertension Mother    Heart failure Mother     Current Outpatient Medications (Endocrine & Metabolic):    levothyroxine (SYNTHROID, LEVOTHROID) 125 MCG tablet, Take 1 tablet (125 mcg total) by mouth daily.   Current Outpatient Medications (Respiratory):    benzonatate (TESSALON PERLES) 100 MG capsule, Take 1 capsule (100 mg total) by mouth 3 (three) times daily as needed.   fluticasone (FLONASE) 50 MCG/ACT nasal spray, Place 2 sprays into both nostrils daily.    Current Outpatient Medications (Other):    Cholecalciferol (VITAMIN D3) 2000 units capsule, Take 1 capsule (2,000 Units total) by mouth daily.   citalopram (CELEXA) 20 MG tablet, Take 1 tablet (20 mg total) by mouth daily.   CRANBERRY EXTRACT PO, Take by mouth.    Estradiol 10 MCG TABS vaginal tablet, Place 1 tablet (10 mcg total) vaginally 2 (two) times a week.   gabapentin (NEURONTIN) 100 MG capsule, Take 2 capsules (200 mg total) by mouth at bedtime.   Multiple Vitamin (MULTIVITAMIN) tablet, Take 1 tablet by mouth daily. Life's Abundance   NON FORMULARY, Allerfex  1 by mouth once daily as needed   NON FORMULARY,    pantoprazole (PROTONIX) 20 MG tablet, Take 20 mg by mouth daily.   traZODone (DESYREL) 50 MG tablet, TAKE 1/2 TO 1 (ONE-HALF TO ONE) TABLET BY MOUTH AT BEDTIME AS NEEDED FOR SLEEP   Valerian Root 100 MG CAPS, Take 1 capsule by mouth daily.     zinc gluconate 50 MG tablet, Take 50 mg by mouth daily.   Zoster Vaccine Adjuvanted [facility]) injection,     Past medical history, social, surgical and family history all reviewed in electronic medical record.  No pertanent information unless stated regarding to the chief complaint.   Review of Systems:  No headache, visual changes, nausea, vomiting, diarrhea, constipation, dizziness, abdominal pain, skin rash, fevers, chills, night sweats, weight loss, swollen lymph nodes, body aches, joint swelling, muscle aches, chest pain, shortness of breath, mood changes.   Objective  There were no vitals taken for this visit. Systems examined below as of    General: No apparent distress alert and oriented x3 mood and affect normal, dressed appropriately.  HEENT: Pupils equal, extraocular movements intact  Respiratory: Patient's speak in full sentences and does not appear short of breath  Cardiovascular: No lower extremity edema, non tender, no erythema  Skin: Warm dry intact with no signs of infection or rash on extremities or on axial skeleton.  Abdomen: Soft nontender  Neuro: Cranial nerves II through XII are intact, neurovascularly intact in all extremities with 2+ DTRs and 2+ pulses.  Lymph: No lymphadenopathy of posterior or anterior cervical chain or axillae bilaterally.  Gait normal  with good balance and coordination.  MSK:  Non tender with full range of motion and good stability and symmetric strength and tone of shoulders, elbows, wrist, hip, knee and ankles bilaterally.  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  Spine between the first and second toes.  Hammertoes second and third toe.  Positive squeeze test.  Tenderness between the second and third toes.   Procedure: Real-time Ultrasound Guided Injection of left foot neuroma Device: GE Logiq Q7 Ultrasound guided injection is preferred based studies that  show increased duration, increased effect, greater accuracy, decreased procedural pain, increased response rate, and decreased cost with ultrasound guided versus blind injection.  Verbal informed consent obtained.  Time-out conducted.  Noted no overlying erythema, induration, or other signs of local infection.  Skin prepped in a sterile fashion.  Local anesthesia: Topical Ethyl chloride.  With sterile technique and under real time ultrasound guidance: With a 25-gauge half inch needle injected with 0.5 cc of 0.5% Marcaine and 0.5 cc of Kenalog 40 mg/mL into the neural tissue sheath Completed without difficulty  Pain immediately resolved suggesting accurate placement of the medication.  Advised to call if fevers/chills, erythema, induration, drainage, or persistent bleeding.  Images permanently stored and available for review in the ultrasound unit.  Impression: Technically successful ultrasound guided injection.   Impression and Recommendations:     This case required medical decision making of moderate complexity. The above documentation has been reviewed and is accurate and complete Lyndal Pulley, DO       Note: This dictation was prepared with Dragon dictation along with smaller phrase technology. Any transcriptional errors that result from this process are unintentional.

## 2018-06-08 NOTE — Patient Instructions (Signed)
[facility] orthotics "total support" Follow up in 6 weeks Exercise 3 times a week Gabapentin 300 mg at night

## 2018-06-15 ENCOUNTER — Ambulatory Visit (INDEPENDENT_AMBULATORY_CARE_PROVIDER_SITE_OTHER): Payer: Medicare Other | Admitting: Internal Medicine

## 2018-06-15 ENCOUNTER — Encounter: Payer: Self-pay | Admitting: Internal Medicine

## 2018-06-15 DIAGNOSIS — E034 Atrophy of thyroid (acquired): Secondary | ICD-10-CM | POA: Diagnosis not present

## 2018-06-15 DIAGNOSIS — R635 Abnormal weight gain: Secondary | ICD-10-CM

## 2018-06-15 DIAGNOSIS — G4700 Insomnia, unspecified: Secondary | ICD-10-CM

## 2018-06-15 NOTE — Progress Notes (Signed)
Virtual Visit via Video Note  I connected with Ellen [name] on 06/15/18 at  8:30 AM EDT by a video enabled telemedicine application and verified that I am speaking with the correct person using two identifiers.   I discussed the limitations of evaluation and management by telemedicine and the availability of in person appointments. The patient expressed understanding and agreed to proceed.  History of Present Illness: We need to follow-up on insomnia, hypothyroidism. C/o wt gain 8 lbs  There has been no runny nose, cough, chest pain, shortness of breath, abdominal pain, diarrhea, constipation, arthralgias, skin rashes.   Observations/Objective: The patient appears to be in no acute distress, looks well.  Assessment and Plan:  See my Assessment and Plan. Follow Up Instructions:    I discussed the assessment and treatment plan with the patient. The patient was provided an opportunity to ask questions and all were answered. The patient agreed with the plan and demonstrated an understanding of the instructions.   The patient was advised to call back or seek an in-person evaluation if the symptoms worsen or if the condition fails to improve as anticipated.  I provided face-to-face time during this encounter. We were at different locations.   Walker Kehr, MD

## 2018-06-15 NOTE — Assessment & Plan Note (Signed)
Thyroid labs The Obesity code

## 2018-06-15 NOTE — Assessment & Plan Note (Signed)
labs

## 2018-06-15 NOTE — Assessment & Plan Note (Signed)
Trazodone  °

## 2018-06-16 ENCOUNTER — Other Ambulatory Visit (INDEPENDENT_AMBULATORY_CARE_PROVIDER_SITE_OTHER): Payer: Medicare Other

## 2018-06-16 DIAGNOSIS — R635 Abnormal weight gain: Secondary | ICD-10-CM | POA: Diagnosis not present

## 2018-06-16 DIAGNOSIS — E034 Atrophy of thyroid (acquired): Secondary | ICD-10-CM

## 2018-06-16 LAB — BASIC METABOLIC PANEL
BUN: 16 mg/dL (ref 6–23)
CO2: 25 mEq/L (ref 19–32)
Calcium: 9 mg/dL (ref 8.4–10.5)
Chloride: 106 mEq/L (ref 96–112)
Creatinine, Ser: 0.78 mg/dL (ref 0.40–1.20)
GFR: 73.07 mL/min (ref >60.00)
Glucose, Bld: 89 mg/dL (ref 70–99)
Potassium: 3.9 mEq/L (ref 3.5–5.1)
Sodium: 141 mEq/L (ref 135–145)

## 2018-06-16 LAB — TSH: TSH: 3.14 u[IU]/mL (ref 0.35–4.50)

## 2018-06-16 LAB — T4, FREE: Free T4: 0.95 ng/dL (ref 0.60–1.60)

## 2018-07-12 ENCOUNTER — Ambulatory Visit: Payer: Medicare Other | Admitting: Family Medicine

## 2018-07-20 ENCOUNTER — Ambulatory Visit: Payer: Medicare Other | Admitting: Family Medicine

## 2018-08-12 ENCOUNTER — Other Ambulatory Visit: Payer: Self-pay | Admitting: Family

## 2018-08-12 DIAGNOSIS — J069 Acute upper respiratory infection, unspecified: Secondary | ICD-10-CM

## 2018-08-26 ENCOUNTER — Other Ambulatory Visit: Payer: Self-pay

## 2018-08-26 ENCOUNTER — Other Ambulatory Visit: Payer: Self-pay | Admitting: Internal Medicine

## 2018-08-26 DIAGNOSIS — J069 Acute upper respiratory infection, unspecified: Secondary | ICD-10-CM

## 2018-08-26 MED ORDER — BENZONATATE 100 MG PO CAPS: 100.0000 mg | ORAL_CAPSULE | Freq: Three times a day (TID) | ORAL | 1 refills | Status: DC | PRN

## 2018-09-08 DIAGNOSIS — K219 Gastro-esophageal reflux disease without esophagitis: Secondary | ICD-10-CM | POA: Diagnosis not present

## 2018-09-09 ENCOUNTER — Ambulatory Visit (INDEPENDENT_AMBULATORY_CARE_PROVIDER_SITE_OTHER): Payer: Medicare Other

## 2018-09-09 ENCOUNTER — Other Ambulatory Visit: Payer: Self-pay

## 2018-09-09 DIAGNOSIS — Z23 Encounter for immunization: Secondary | ICD-10-CM | POA: Diagnosis not present

## 2018-09-14 DIAGNOSIS — H2513 Age-related nuclear cataract, bilateral: Secondary | ICD-10-CM | POA: Diagnosis not present

## 2018-09-14 DIAGNOSIS — H43813 Vitreous degeneration, bilateral: Secondary | ICD-10-CM | POA: Diagnosis not present

## 2018-09-14 DIAGNOSIS — H04123 Dry eye syndrome of bilateral lacrimal glands: Secondary | ICD-10-CM | POA: Diagnosis not present

## 2018-09-21 ENCOUNTER — Other Ambulatory Visit: Payer: Self-pay | Admitting: Internal Medicine

## 2018-10-05 DIAGNOSIS — H9201 Otalgia, right ear: Secondary | ICD-10-CM | POA: Diagnosis not present

## 2018-10-05 DIAGNOSIS — J310 Chronic rhinitis: Secondary | ICD-10-CM | POA: Diagnosis not present

## 2018-10-13 ENCOUNTER — Encounter: Payer: Self-pay | Admitting: Gynecology

## 2018-10-15 ENCOUNTER — Other Ambulatory Visit: Payer: Self-pay | Admitting: Internal Medicine

## 2018-10-18 ENCOUNTER — Telehealth: Payer: Self-pay | Admitting: *Deleted

## 2018-10-18 DIAGNOSIS — R300 Dysuria: Secondary | ICD-10-CM

## 2018-10-18 NOTE — Progress Notes (Signed)
[name] [address] [place], [place] [address] Phone: [phone] Subjective:   Ellen [name], am serving as a scribe for Dr. Hulan Saas.   CC: right knee pain   [id number]   06/08/2018 Patient given injection today.  Tolerated the procedure well.  We discussed gabapentin and increased dosing to 300 mg.  Patient was doing very well otherwise.  We discussed icing regimen and home exercises.  We discussed proper shoes and over-the-counter orthotics.  Patient will come back and see me again 6 weeks if not completely resolved.  At that point we will consider custom orthotics and formal physical therapy  Update 10/19/2018 Ellen [name] is a 68 y.o. female coming in with complaint of right knee pain. Last seen in June 2020 for Morton's neuroma. Was putting extra support underneath her deck. Twisted right knee. Is having pain anterior knee into the anterior tibia. Has been wearing knee sleeve. Did have swelling in her knee and leg. Pain with deep knee bending and stair climbing. Using ice, tylenol and some heat.  Denies any significant instability.  Does not know if there has been swelling.  Denies any numbness or weakness of the lower extremity.    Past Medical History:  Diagnosis Date  . ALLERGIC RHINITIS   . ANXIETY   . GERD   . INSOMNIA-SLEEP DISORDER-UNSPEC   . Osteopenia 01/2016   T score -1.2 FRAX 15%/0.9%  . Shingles   . Thyroid disease    Past Surgical History:  Procedure Laterality Date  . ANKLE FRACTURE SURGERY  2012   R  . APPENDECTOMY    . BREAST SURGERY     REDUCTION MAMMOPLASTY  . CESAREAN SECTION     twice  . COSMETIC SURGERY    . FACELIFT     s/p  . TONSILLECTOMY  1974  . Upper nl TSH w/sx    . VAGINAL HYSTERECTOMY  1991   TVH, irregular bleeding   Social History   Socioeconomic History  . Marital status: Married    Spouse name: Not on file  . Number of children: 2  . Years of education: Not on file  . Highest  education level: Not on file  Occupational History  . Occupation: Retired    Fish farm manager: SELF EMPLOYED  Social Needs  . Financial resource strain: Not on file  . Food insecurity    Worry: Not on file    Inability: Not on file  . Transportation needs    Medical: Not on file    Non-medical: Not on file  Tobacco Use  . Smoking status: Former Research scientist (life sciences)  . Smokeless tobacco: Never Used  Substance and Sexual Activity  . Alcohol use: Yes    Alcohol/week: 10.0 standard drinks    Types: 10 Glasses of wine per week  . Drug use: [name]  . Sexual activity: Yes    Birth control/protection: Post-menopausal, Surgical    Comment: HYST-1st intercourse 68 yo-Fewer than 5 partners  Lifestyle  . Physical activity    Days per week: Not on file    Minutes per session: Not on file  . Stress: Not on file  Relationships  . Social Herbalist on phone: Not on file    Gets together: Not on file    Attends religious service: Not on file    Active member of club or organization: Not on file    Attends meetings of clubs or organizations: Not on file  Relationship status: Not on file  Other Topics Concern  . Not on file  Social History Narrative   Regular exercise   2 biol children-1 disabled with schizophrenia & 1 stepchild   Allergies  Allergen Reactions  . Belviq [Lorcaserin Hcl]     palpitations  . Demerol [Meperidine]     palpitations  . Elavil [Amitriptyline Hcl]     constipation  . Latex Hives, Itching and Rash    Allergy  To latex, hives , redness, itching ,rash  . Skin Adhesives Hives, Itching and Rash   Family History  Problem Relation Age of Onset  . Heart disease Father        CAD/CABG in his 48's  . Hypertension Mother   . Heart failure Mother     Current Outpatient Medications (Endocrine & Metabolic):  [name]  EUTHYROX 125 MCG tablet, Take 1 tablet by mouth once daily   Current Outpatient Medications (Respiratory):  .  benzonatate (TESSALON PERLES) 100 MG capsule, Take  1-2 capsules (100-200 mg total) by mouth 3 (three) times daily as needed. .  fluticasone (FLONASE) 50 MCG/ACT nasal spray, Place 2 sprays into both nostrils daily.    Current Outpatient Medications (Other):  [name]  Cholecalciferol (VITAMIN D3) 2000 units capsule, Take 1 capsule (2,000 Units total) by mouth daily. .  citalopram (CELEXA) 20 MG tablet, Take 1 tablet (20 mg total) by mouth daily. [name]  CRANBERRY EXTRACT PO, Take by mouth. .  Estradiol 10 MCG TABS vaginal tablet, Place 1 tablet (10 mcg total) vaginally 2 (two) times a week. .  gabapentin (NEURONTIN) 300 MG capsule, Take 1 capsule (300 mg total) by mouth 3 (three) times daily. .  Multiple Vitamin (MULTIVITAMIN) tablet, Take 1 tablet by mouth daily. Life's Abundance .  NON FORMULARY, Allerfex  1 by mouth once daily as needed .  NON FORMULARY,  .  pantoprazole (PROTONIX) 20 MG tablet, Take 20 mg by mouth daily. .  traZODone (DESYREL) 50 MG tablet, TAKE 1/2 TO 1 TABLET BY MOUTH AT BEDTIME AS NEEDED FOR SLEEP .  Valerian Root 100 MG CAPS, Take 1 capsule by mouth daily.   [name]  zinc gluconate 50 MG tablet, Take 50 mg by mouth daily. [name]  Zoster Vaccine Adjuvanted [facility]) injection,  .  gabapentin (NEURONTIN) 100 MG capsule, Take 2 capsules (200 mg total) by mouth at bedtime.    Past medical history, social, surgical and family history all reviewed in electronic medical record.  [name] pertanent information unless stated regarding to the chief complaint.   Review of Systems:  [name] headache, visual changes, nausea, vomiting, diarrhea, constipation, dizziness, abdominal pain, skin rash, fevers, chills, night sweats, weight loss, swollen lymph nodes, body aches, joint swelling, chest pain, shortness of breath, mood changes.  Positive muscle aches  Objective  Blood pressure 108/72, pulse 65, height 5\' 2"  (1.575 m), weight 158 lb (71.7 kg), SpO2 97 %.    General: [name] apparent distress alert and oriented x3 mood and affect normal, dressed  appropriately.  HEENT: Pupils equal, extraocular movements intact  Respiratory: Patient's speak in full sentences and does not appear short of breath  Cardiovascular: [name] lower extremity edema, non tender, [name] erythema  Skin: Warm dry intact with [name] signs of infection or rash on extremities or on axial skeleton.  Abdomen: Soft nontender  Neuro: Cranial nerves II through XII are intact, neurovascularly intact in all extremities with 2+ DTRs and 2+ pulses.  Lymph: [name] lymphadenopathy of posterior or anterior cervical chain or  axillae bilaterally.  Gait normal with good balance and coordination.  MSK:  Non tender with full range of motion and good stability and symmetric strength and tone of shoulders, elbows, wrist, hip and ankles bilaterally.  Arthritic changes Right knee exam does have some mild decrease in flexion and lacks last 2 degrees of extension.  Pain over the peds anserine area.  Mild patellar grind test noted.  [name] significant instability noted.  Neurovascular intact distally.  [name] instability noted with valgus or varus force  Limited musculoskeletal ultrasound was performed and interpreted by Lyndal Pulley  Limited musculoskeletal ultrasound of patient's knee shows some mild arthritic changes of the tricompartmental area.  Mild patellofemoral effusion noted.  Patient does have swelling of the peds anserine area. 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 The above documentation has been reviewed and is accurate and complete Lyndal Pulley, DO       Note: This dictation was prepared with Dragon dictation along with smaller phrase technology. Any transcriptional errors that result from this process are unintentional.

## 2018-10-18 NOTE — Telephone Encounter (Signed)
Pt left a msg stating she believe she has a UTI & would like an order placed for her to come in get that checked.  Pt was last seen by Dr. Alain Marion on 06/15/18.

## 2018-10-19 ENCOUNTER — Encounter: Payer: Self-pay | Admitting: Family Medicine

## 2018-10-19 ENCOUNTER — Ambulatory Visit: Payer: Self-pay

## 2018-10-19 ENCOUNTER — Other Ambulatory Visit (INDEPENDENT_AMBULATORY_CARE_PROVIDER_SITE_OTHER): Payer: Medicare Other

## 2018-10-19 ENCOUNTER — Other Ambulatory Visit: Payer: Self-pay

## 2018-10-19 ENCOUNTER — Ambulatory Visit: Payer: Medicare Other | Admitting: Family Medicine

## 2018-10-19 VITALS — BP 108/72 | HR 65 | Ht 62.0 in | Wt 158.0 lb

## 2018-10-19 DIAGNOSIS — R300 Dysuria: Secondary | ICD-10-CM

## 2018-10-19 DIAGNOSIS — M25561 Pain in right knee: Secondary | ICD-10-CM | POA: Diagnosis not present

## 2018-10-19 DIAGNOSIS — G8929 Other chronic pain: Secondary | ICD-10-CM

## 2018-10-19 DIAGNOSIS — M7051 Other bursitis of knee, right knee: Secondary | ICD-10-CM

## 2018-10-19 LAB — URINALYSIS
Bilirubin Urine: NEGATIVE
Hgb urine dipstick: NEGATIVE
Ketones, ur: NEGATIVE
Leukocytes,Ua: NEGATIVE
Nitrite: NEGATIVE
Specific Gravity, Urine: 1.01 (ref 1.000–1.030)
Total Protein, Urine: NEGATIVE
Urine Glucose: NEGATIVE
Urobilinogen, UA: 0.2 (ref 0.0–1.0)
pH: 6 (ref 5.0–8.0)

## 2018-10-19 NOTE — Patient Instructions (Addendum)
Dr. Natasha [facility] Exercises 3x a week Knee brace with activity Voltaren gel 2x daily See me in 6-8 weeks

## 2018-10-19 NOTE — Telephone Encounter (Signed)
Ok [facility] Thx 

## 2018-10-19 NOTE — Assessment & Plan Note (Signed)
Bursitis noted.  Mild arthritic changes of the knee.  Patient does have some patellofemoral bone pain is more consistent with him anserine bursitis.  Discussed thigh compression, hamstring strengthening, icing regimen.  Follow-up again in 4 to 6 weeks worsening pain will consider injection and formal physical therapy

## 2018-10-19 NOTE — Telephone Encounter (Signed)
Order entered

## 2018-10-20 ENCOUNTER — Telehealth: Payer: Self-pay | Admitting: *Deleted

## 2018-10-20 NOTE — Telephone Encounter (Signed)
Pt left msg stating that she would like a call back from [facility]. Pt has questions in reference to OTC orthotics she was told to get at her OV yesterday.

## 2018-10-20 NOTE — Telephone Encounter (Signed)
Spoke with patient. Recommended to look locally for orthotics if she cannot find them on [employer]. [place] or [place] sells them in town. Patient voices understanding.

## 2018-12-07 ENCOUNTER — Encounter: Payer: Self-pay | Admitting: Family Medicine

## 2018-12-10 ENCOUNTER — Ambulatory Visit: Payer: Medicare Other | Admitting: Family Medicine

## 2018-12-10 ENCOUNTER — Encounter: Payer: Self-pay | Admitting: Family Medicine

## 2018-12-10 ENCOUNTER — Ambulatory Visit (INDEPENDENT_AMBULATORY_CARE_PROVIDER_SITE_OTHER): Payer: Medicare Other | Admitting: Family Medicine

## 2018-12-10 DIAGNOSIS — M7051 Other bursitis of knee, right knee: Secondary | ICD-10-CM | POA: Diagnosis not present

## 2018-12-10 NOTE — Progress Notes (Signed)
Virtual Visit via Video Note  I connected with Ellen [name] on 12/10/18 at 12:15 PM EST by a video enabled telemedicine application and verified that I am speaking with the correct person using two identifiers.  Location: Patient: Patient is in home setting with husband Provider: Patient in office setting   I discussed the limitations of evaluation and management by telemedicine and the availability of in person appointments. The patient expressed understanding and agreed to proceed.  History of Present Illness: Patient is a 68 year old female who is following up for knee pain.  Was found initially to have more of the pedis anserine bursitis.    Observations/Objective: Alert and oriented x3 patient is doing well.  Even walking around while we discussed on her phone.   Assessment and Plan: 68 year old female who had pedis anserine bursitis 70% improvement.  Encouraged still bracing, orthotics, icing regimen, we discussed other ergonomics that can be changes throughout the day.  Patient will follow up with me again as needed   Follow Up Instructions: As needed    I discussed the assessment and treatment plan with the patient. The patient was provided an opportunity to ask questions and all were answered. The patient agreed with the plan and demonstrated an understanding of the instructions.   The patient was advised to call back or seek an in-person evaluation if the symptoms worsen or if the condition fails to improve as anticipated.  I provided 13 minutes of face-to-face time during this encounter.   Lyndal Pulley, DO

## 2018-12-15 DIAGNOSIS — L218 Other seborrheic dermatitis: Secondary | ICD-10-CM | POA: Diagnosis not present

## 2018-12-15 DIAGNOSIS — L649 Androgenic alopecia, unspecified: Secondary | ICD-10-CM | POA: Diagnosis not present

## 2019-01-05 ENCOUNTER — Encounter: Payer: Self-pay | Admitting: Family Medicine

## 2019-01-13 ENCOUNTER — Other Ambulatory Visit: Payer: Self-pay | Admitting: Internal Medicine

## 2019-01-18 DIAGNOSIS — Z1231 Encounter for screening mammogram for malignant neoplasm of breast: Secondary | ICD-10-CM | POA: Diagnosis not present

## 2019-01-19 ENCOUNTER — Encounter: Payer: Medicare Other | Admitting: Gynecology

## 2019-02-03 ENCOUNTER — Other Ambulatory Visit: Payer: Self-pay

## 2019-02-03 ENCOUNTER — Ambulatory Visit (INDEPENDENT_AMBULATORY_CARE_PROVIDER_SITE_OTHER): Payer: Medicare Other | Admitting: Obstetrics and Gynecology

## 2019-02-03 ENCOUNTER — Encounter: Payer: Self-pay | Admitting: Internal Medicine

## 2019-02-03 ENCOUNTER — Ambulatory Visit (INDEPENDENT_AMBULATORY_CARE_PROVIDER_SITE_OTHER): Payer: Medicare Other | Admitting: Internal Medicine

## 2019-02-03 ENCOUNTER — Encounter: Payer: Self-pay | Admitting: Obstetrics and Gynecology

## 2019-02-03 VITALS — BP 128/82 | HR 65 | Temp 98.3°F | Ht 62.0 in

## 2019-02-03 VITALS — BP 122/76 | Ht 62.0 in | Wt 158.0 lb

## 2019-02-03 DIAGNOSIS — F411 Generalized anxiety disorder: Secondary | ICD-10-CM | POA: Diagnosis not present

## 2019-02-03 DIAGNOSIS — M8580 Other specified disorders of bone density and structure, unspecified site: Secondary | ICD-10-CM

## 2019-02-03 DIAGNOSIS — I10 Essential (primary) hypertension: Secondary | ICD-10-CM | POA: Diagnosis not present

## 2019-02-03 DIAGNOSIS — Z01419 Encounter for gynecological examination (general) (routine) without abnormal findings: Secondary | ICD-10-CM

## 2019-02-03 DIAGNOSIS — F32A Depression, unspecified: Secondary | ICD-10-CM

## 2019-02-03 DIAGNOSIS — Z0000 Encounter for general adult medical examination without abnormal findings: Secondary | ICD-10-CM | POA: Diagnosis not present

## 2019-02-03 DIAGNOSIS — F329 Major depressive disorder, single episode, unspecified: Secondary | ICD-10-CM

## 2019-02-03 DIAGNOSIS — E785 Hyperlipidemia, unspecified: Secondary | ICD-10-CM

## 2019-02-03 LAB — URINALYSIS, ROUTINE W REFLEX MICROSCOPIC
Bilirubin Urine: NEGATIVE
Hgb urine dipstick: NEGATIVE
Ketones, ur: NEGATIVE
Nitrite: NEGATIVE
RBC / HPF: NONE SEEN (ref 0–?)
Specific Gravity, Urine: 1.015 (ref 1.000–1.030)
Total Protein, Urine: NEGATIVE
Urine Glucose: NEGATIVE
Urobilinogen, UA: 0.2 (ref 0.0–1.0)
pH: 7 (ref 5.0–8.0)

## 2019-02-03 LAB — CBC WITH DIFFERENTIAL/PLATELET
Basophils Absolute: 0.1 10*3/uL (ref 0.0–0.1)
Basophils Relative: 1.2 % (ref 0.0–3.0)
Eosinophils Absolute: 0.3 10*3/uL (ref 0.0–0.7)
Eosinophils Relative: 6.5 % — ABNORMAL HIGH (ref 0.0–5.0)
HCT: 42 % (ref 36.0–46.0)
Hemoglobin: 13.8 g/dL (ref 12.0–15.0)
Lymphocytes Relative: 38.9 % (ref 12.0–46.0)
Lymphs Abs: 1.9 10*3/uL (ref 0.7–4.0)
MCHC: 32.9 g/dL (ref 30.0–36.0)
MCV: 94.2 fl (ref 78.0–100.0)
Monocytes Absolute: 0.5 10*3/uL (ref 0.1–1.0)
Monocytes Relative: 10 % (ref 3.0–12.0)
Neutro Abs: 2.1 10*3/uL (ref 1.4–7.7)
Neutrophils Relative %: 43.4 % (ref 43.0–77.0)
Platelets: 228 10*3/uL (ref 150.0–400.0)
RBC: 4.46 Mil/uL (ref 3.87–5.11)
RDW: 14.2 % (ref 11.5–15.5)
WBC: 4.8 10*3/uL (ref 4.0–10.5)

## 2019-02-03 LAB — HEPATIC FUNCTION PANEL
ALT: 13 U/L (ref 0–35)
AST: 21 U/L (ref 0–37)
Albumin: 4.3 g/dL (ref 3.5–5.2)
Alkaline Phosphatase: 86 U/L (ref 39–117)
Bilirubin, Direct: 0.1 mg/dL (ref 0.0–0.3)
Total Bilirubin: 0.3 mg/dL (ref 0.2–1.2)
Total Protein: 7.1 g/dL (ref 6.0–8.3)

## 2019-02-03 LAB — BASIC METABOLIC PANEL
BUN: 14 mg/dL (ref 6–23)
CO2: 28 mEq/L (ref 19–32)
Calcium: 9.2 mg/dL (ref 8.4–10.5)
Chloride: 106 mEq/L (ref 96–112)
Creatinine, Ser: 0.76 mg/dL (ref 0.40–1.20)
GFR: 75.15 mL/min (ref >60.00)
Glucose, Bld: 90 mg/dL (ref 70–99)
Potassium: 4.4 mEq/L (ref 3.5–5.1)
Sodium: 140 mEq/L (ref 135–145)

## 2019-02-03 LAB — LIPID PANEL
Cholesterol: 233 mg/dL — ABNORMAL HIGH (ref 0–200)
HDL: 72.3 mg/dL (ref >39.00)
LDL Cholesterol: 127 mg/dL — ABNORMAL HIGH (ref 0–99)
NonHDL: 160.54
Total CHOL/HDL Ratio: 3
Triglycerides: 166 mg/dL — ABNORMAL HIGH (ref 0.0–149.0)
VLDL: 33.2 mg/dL (ref 0.0–40.0)

## 2019-02-03 LAB — TSH: TSH: 2.33 u[IU]/mL (ref 0.35–4.50)

## 2019-02-03 MED ORDER — GABAPENTIN 300 MG PO CAPS: 300.0000 mg | ORAL_CAPSULE | Freq: Three times a day (TID) | ORAL | 3 refills | Status: DC

## 2019-02-03 MED ORDER — PANTOPRAZOLE SODIUM 20 MG PO TBEC: 20.0000 mg | DELAYED_RELEASE_TABLET | Freq: Every day | ORAL | 3 refills | Status: DC

## 2019-02-03 MED ORDER — LEVOTHYROXINE SODIUM 125 MCG PO TABS: 125.0000 ug | ORAL_TABLET | Freq: Every day | ORAL | 3 refills | Status: DC

## 2019-02-03 MED ORDER — CITALOPRAM HYDROBROMIDE 20 MG PO TABS: 20.0000 mg | ORAL_TABLET | Freq: Every day | ORAL | 3 refills | Status: DC

## 2019-02-03 MED ORDER — TRAZODONE HCL 50 MG PO TABS: ORAL_TABLET | ORAL | 1 refills | Status: DC

## 2019-02-03 NOTE — Assessment & Plan Note (Signed)
celexa 

## 2019-02-03 NOTE — Progress Notes (Signed)
Kay Boeck 24-Jun-1950 [id number]  SUBJECTIVE:  69 y.o. [id number] female for annual routine gynecologic exam. She has no gynecologic concerns. Only taking the Vagifem once per week.  Current Outpatient Medications  Medication Sig Dispense Refill  . Cholecalciferol (VITAMIN D3) 2000 units capsule Take 1 capsule (2,000 Units total) by mouth daily. 100 capsule 3  . citalopram (CELEXA) 20 MG tablet Take 1 tablet (20 mg total) by mouth daily. Annual appt is due must see provider for future refills 90 tablet 3  . CRANBERRY EXTRACT PO Take by mouth.    . Estradiol 10 MCG TABS vaginal tablet Place 1 tablet (10 mcg total) vaginally 2 (two) times a week. 8 tablet 11  . fluticasone (FLONASE) 50 MCG/ACT nasal spray Place 2 sprays into both nostrils daily. 11.1 g 2  . gabapentin (NEURONTIN) 300 MG capsule Take 1 capsule (300 mg total) by mouth 3 (three) times daily. 90 capsule 3  . levothyroxine (SYNTHROID) 125 MCG tablet Take 1 tablet (125 mcg total) by mouth daily. 90 tablet 3  . Multiple Vitamin (MULTIVITAMIN) tablet Take 1 tablet by mouth daily. Life's Abundance    . NON FORMULARY Allerfex  1 by mouth once daily as needed    . NON FORMULARY     . pantoprazole (PROTONIX) 20 MG tablet Take 1 tablet (20 mg total) by mouth daily. 90 tablet 3  . traZODone (DESYREL) 50 MG tablet TAKE 1/2 TO 1 TABLET BY MOUTH AT BEDTIME AS NEEDED FOR SLEEP 90 tablet 1  . Valerian Root 100 MG CAPS Take 1 capsule by mouth daily.      [name] zinc gluconate 50 MG tablet Take 50 mg by mouth daily.    [name] Zoster Vaccine Adjuvanted [facility]) injection     . benzonatate (TESSALON PERLES) 100 MG capsule Take 1-2 capsules (100-200 mg total) by mouth 3 (three) times daily as needed. (Patient not taking: Reported on 02/03/2019) 60 capsule 1   No current facility-administered medications for this visit.   Allergies: Belviq [lorcaserin hcl], Demerol [meperidine], Elavil [amitriptyline hcl], Latex, and Medical adhesive remover  No LMP  recorded. Patient has had a hysterectomy.  Past medical history,surgical history, problem list, medications, allergies, family history and social history were all reviewed and documented as reviewed in the EPIC chart.  ROS:  Feeling well. No dyspnea or chest pain on exertion.  No abdominal pain, change in bowel habits, black or bloody stools.  No urinary tract symptoms. GYN ROS: no abnormal bleeding, pelvic pain or discharge, no breast pain or new or enlarging lumps on self exam. No neurological complaints.    OBJECTIVE:  BP 122/76   Ht 5\' 2"  (1.575 m)   Wt 158 lb (71.7 kg)   BMI 28.90 kg/m  The patient appears well, alert, oriented x 3, in no distress. ENT normal.  Neck supple. No cervical or supraclavicular adenopathy or thyromegaly.  Lungs are clear, good air entry, no wheezes, rhonchi or rales. S1 and S2 normal, no murmurs, regular rate and rhythm.  Abdomen soft without tenderness, guarding, mass or organomegaly.  Neurological is normal, no focal findings.  BREAST EXAM: breasts appear normal, no suspicious masses, no skin or nipple changes or axillary nodes  PELVIC EXAM: VULVA: normal appearing vulva with no masses, tenderness or lesions, atrophic changes noted, VAGINA: normal appearing vagina with normal color and discharge, no lesions, CERVIX: surgically absent, UTERUS: surgically absent, vaginal cuff well healed, ADNEXA: no masses, no tenderness  Chaperone: [name] present during the examination  ASSESSMENT:  69 y.o. [id number] here for annual gynecologic exam  PLAN:   1. Postmenopausal. Prior hysterectomy (TVH in 1991 for AUB).  Using Vagifem once weekly which is fine as less is better if effective, and we reviewed rare risks with systemic absorption. 2. Pap smear 12/2016. Not repeated today. No prior history of abnormal Pap smears.  She is okay with not having Pap smear surveillance based on age criteria. 3. Mammogram 01/2019. Will continue with annual mammography. Breast  exam normal today. 4. Colonoscopy 2011.  States she is repeating it this year.  Also getting an EGD for evaluation of a history of minor stomach ulcers. 5.  Osteopenia.   DEXA 2020 T score -2.3 at the distal third of the radius.  Normal values at the spine and right and left hips.  She is active and healthy.  DEXA indicated again in 2022. 6. Health maintenance.  No lab work as she has this completed with her primary care provider.   Return annually or sooner, prn.  Larey Days MD  02/03/19

## 2019-02-03 NOTE — Assessment & Plan Note (Signed)
A cardiac CT scan for coronary calcium offered 

## 2019-02-03 NOTE — Assessment & Plan Note (Signed)
Ellen [name]

## 2019-02-03 NOTE — Assessment & Plan Note (Addendum)
We discussed age appropriate health related issues, including available/recomended screening tests and vaccinations. We discussed a need for adhering to healthy diet and exercise. Labs were ordered to be later reviewed . All questions were answered. A cardiac CT scan for coronary calcium offered. Colon/EGD - pending in 4/21

## 2019-02-03 NOTE — Patient Instructions (Addendum)
Cardiac CT calcium scoring test $150 Tel # is [phone]   Computed tomography, more commonly known as a CT or CAT scan, is a diagnostic medical imaging test. Like traditional x-rays, it produces multiple images or pictures of the inside of the body. The cross-sectional images generated during a CT scan can be reformatted in multiple planes. They can even generate three-dimensional images. These images can be viewed on a computer monitor, printed on film or by a 3D printer, or transferred to a CD or DVD. CT images of internal organs, bones, soft tissue and blood vessels provide greater detail than traditional x-rays, particularly of soft tissues and blood vessels. A cardiac CT scan for coronary calcium is a non-invasive way of obtaining information about the presence, location and extent of calcified plaque in the coronary arteries--the vessels that supply oxygen-containing blood to the heart muscle. Calcified plaque results when there is a build-up of fat and other substances under the inner layer of the artery. This material can calcify which signals the presence of atherosclerosis, a disease of the vessel wall, also called coronary artery disease (CAD). People with this disease have an increased risk for heart attacks. In addition, over time, progression of plaque build up (CAD) can narrow the arteries or even close off blood flow to the heart. The result may be chest pain, sometimes called "angina," or a heart attack. Because calcium is a marker of CAD, the amount of calcium detected on a cardiac CT scan is a helpful prognostic tool. The findings on cardiac CT are expressed as a calcium score. Another name for this test is coronary artery calcium scoring.  What are some common uses of the procedure? The goal of cardiac CT scan for calcium scoring is to determine if CAD is present and to what extent, even if there are no symptoms. It is a screening study that may be recommended by a physician for  patients with risk factors for CAD but no clinical symptoms. The major risk factors for CAD are: . high blood cholesterol levels  . family history of heart attacks  . diabetes  . high blood pressure  . cigarette smoking  . overweight or obese  . physical inactivity   A negative cardiac CT scan for calcium scoring shows no calcification within the coronary arteries. This suggests that CAD is absent or so minimal it cannot be seen by this technique. The chance of having a heart attack over the next two to five years is very low under these circumstances. A positive test means that CAD is present, regardless of whether or not the patient is experiencing any symptoms. The amount of calcification--expressed as the calcium score--may help to predict the likelihood of a myocardial infarction (heart attack) in the coming years and helps your medical doctor or cardiologist decide whether the patient may need to take preventive medicine or undertake other measures such as diet and exercise to lower the risk for heart attack. The extent of CAD is graded according to your calcium score:  Calcium Score  Presence of CAD (coronary artery disease)  0 No evidence of CAD   1-10 Minimal evidence of CAD  11-100 Mild evidence of CAD  101-400 Moderate evidence of CAD  Over 400 Extensive evidence of CAD    These suggestions will probably help you to improve your metabolism if you are not overweight and to lose weight if you are overweight: 1.  Reduce your consumption of sugars and starches.  Eliminate high fructose corn  syrup from your diet.  Reduce your consumption of processed foods.  For desserts try to have seasonal fruits, berries, nuts, cheeses or dark chocolate with more than 70% cacao. 2.  Do not snack 3.  You do not have to eat breakfast.  If you choose to have breakfast-eat plain greek yogurt, eggs, oatmeal (without sugar) 4.  Drink water, freshly brewed unsweetened tea (green, black or herbal) or  coffee.  Do not drink sodas including diet sodas , juices, beverages sweetened with artificial sweeteners. 5.  Reduce your consumption of refined grains. 6.  Avoid protein drinks such as Optifast, Slim fast etc. Eat chicken, fish, meat, dairy and beans for your sources of protein 7.  Natural unprocessed fats like cold pressed virgin olive oil, butter, coconut oil are good for you.  Eat avocados 8.  Increase your consumption of fiber.  Fruits, berries, vegetables, whole grains, flaxseeds, Chia seeds, beans, popcorn, nuts, oatmeal are good sources of fiber 9.  Use vinegar in your diet, i.e. apple cider vinegar, red wine or balsamic vinegar 10.  You can try fasting.  For example you can skip breakfast and lunch every other day (24-hour fast) 11.  Stress reduction, good night sleep, relaxation, meditation, yoga and other physical activity is likely to help you to maintain low weight too. 12.  If you drink alcohol, limit your alcohol intake to no more than 2 drinks a day.

## 2019-02-03 NOTE — Patient Instructions (Addendum)
It was nice to meet you today! Please remember to schedule your colonoscopy this year. Using the vaginal estrogen tablet once per week is just fine if that is effective for you. Plan to repeat bone density scan in 1 year.

## 2019-02-03 NOTE — Progress Notes (Signed)
Subjective:  Patient ID: Ellen [name], female    DOB: 01/25/1950  Age: 69 y.o. MRN: [id number]  CC: No chief complaint on file.   HPI Ellen [name] presents for a well exam C/o wt gain, memory issues   Outpatient Medications Prior to Visit  Medication Sig Dispense Refill  . benzonatate (TESSALON PERLES) 100 MG capsule Take 1-2 capsules (100-200 mg total) by mouth 3 (three) times daily as needed. 60 capsule 1  . Cholecalciferol (VITAMIN D3) 2000 units capsule Take 1 capsule (2,000 Units total) by mouth daily. 100 capsule 3  . citalopram (CELEXA) 20 MG tablet Take 1 tablet (20 mg total) by mouth daily. Annual appt is due must see provider for future refills 30 tablet 0  . CRANBERRY EXTRACT PO Take by mouth.    . Estradiol 10 MCG TABS vaginal tablet Place 1 tablet (10 mcg total) vaginally 2 (two) times a week. 8 tablet 11  . fluticasone (FLONASE) 50 MCG/ACT nasal spray Place 2 sprays into both nostrils daily. 11.1 g 2  . gabapentin (NEURONTIN) 300 MG capsule Take 1 capsule (300 mg total) by mouth 3 (three) times daily. 90 capsule 3  . levothyroxine (SYNTHROID) 125 MCG tablet Take 1 tablet by mouth once daily 90 tablet 3  . Multiple Vitamin (MULTIVITAMIN) tablet Take 1 tablet by mouth daily. Life's Abundance    . NON FORMULARY Allerfex  1 by mouth once daily as needed    . NON FORMULARY     . pantoprazole (PROTONIX) 20 MG tablet Take 20 mg by mouth daily.  1  . traZODone (DESYREL) 50 MG tablet TAKE 1/2 TO 1 TABLET BY MOUTH AT BEDTIME AS NEEDED FOR SLEEP 90 tablet 1  . Valerian Root 100 MG CAPS Take 1 capsule by mouth daily.      [name] zinc gluconate 50 MG tablet Take 50 mg by mouth daily.    [name] Zoster Vaccine Adjuvanted [facility]) injection     . gabapentin (NEURONTIN) 100 MG capsule Take 2 capsules (200 mg total) by mouth at bedtime. 180 capsule 1   No facility-administered medications prior to visit.    ROS: Review of Systems  Constitutional: Negative for activity change,  appetite change, chills, fatigue and unexpected weight change.  HENT: Negative for congestion, mouth sores and sinus pressure.   Eyes: Negative for visual disturbance.  Respiratory: Negative for cough and chest tightness.   Gastrointestinal: Negative for abdominal pain and nausea.  Genitourinary: Negative for difficulty urinating, frequency and vaginal pain.  Musculoskeletal: Negative for back pain and gait problem.  Skin: Negative for pallor and rash.  Neurological: Negative for dizziness, tremors, weakness, numbness and headaches.  Psychiatric/Behavioral: Positive for decreased concentration. Negative for confusion, sleep disturbance and suicidal ideas. The patient is nervous/anxious.     Objective:  BP 128/82 (BP Location: Left Arm, Patient Position: Sitting, Cuff Size: Normal)   Pulse 65   Temp 98.3 F (36.8 C) (Oral)   Ht 5\' 2"  (1.575 m)   SpO2 97%   BMI 28.90 kg/m   BP Readings from Last 3 Encounters:  02/03/19 128/82  10/19/18 108/72  06/08/18 114/70    Wt Readings from Last 3 Encounters:  10/19/18 158 lb (71.7 kg)  06/08/18 160 lb (72.6 kg)  02/01/18 159 lb (72.1 kg)    Physical Exam Constitutional:      General: She is not in acute distress.    Appearance: She is well-developed. She is obese.  HENT:     Head:  Normocephalic.     Right Ear: External ear normal.     Left Ear: External ear normal.     Nose: Nose normal.  Eyes:     General:        Right eye: No discharge.        Left eye: No discharge.     Conjunctiva/sclera: Conjunctivae normal.     Pupils: Pupils are equal, round, and reactive to light.  Neck:     Thyroid: No thyromegaly.     Vascular: No JVD.     Trachea: No tracheal deviation.  Cardiovascular:     Rate and Rhythm: Normal rate and regular rhythm.     Heart sounds: Normal heart sounds.  Pulmonary:     Effort: No respiratory distress.     Breath sounds: No stridor. No wheezing.  Abdominal:     General: Bowel sounds are normal. There is  no distension.     Palpations: Abdomen is soft. There is no mass.     Tenderness: There is no abdominal tenderness. There is no guarding or rebound.  Musculoskeletal:        General: No tenderness.     Cervical back: Normal range of motion and neck supple.  Lymphadenopathy:     Cervical: No cervical adenopathy.  Skin:    Findings: No erythema or rash.  Neurological:     Cranial Nerves: No cranial nerve deficit.     Motor: No abnormal muscle tone.     Coordination: Coordination normal.     Deep Tendon Reflexes: Reflexes normal.  Psychiatric:        Behavior: Behavior normal.        Thought Content: Thought content normal.        Judgment: Judgment normal.     Lab Results  Component Value Date   WBC 5.9 02/01/2018   HGB 14.0 02/01/2018   HCT 41.9 02/01/2018   PLT 205.0 02/01/2018   GLUCOSE 89 06/16/2018   CHOL 222 (H) 02/01/2018   TRIG 137.0 02/01/2018   HDL 74.80 02/01/2018   LDLDIRECT 142.6 07/09/2011   LDLCALC 119 (H) 02/01/2018   ALT 16 02/01/2018   AST 19 02/01/2018   NA 141 06/16/2018   K 3.9 06/16/2018   CL 106 06/16/2018   CREATININE 0.78 06/16/2018   BUN 16 06/16/2018   CO2 25 06/16/2018   TSH 3.14 06/16/2018   HGBA1C 5.6 10/12/2006    CT MAXILLOFACIAL WO CONTRAST  Result Date: 01/18/2018 CLINICAL DATA:  Chronic sinusitis. EXAM: CT MAXILLOFACIAL WITHOUT CONTRAST TECHNIQUE: Multidetector CT images of the paranasal sinuses were obtained using the standard protocol without intravenous contrast. COMPARISON:  Coronal sinus CT 02/26/2007 FINDINGS: Paranasal sinuses: Frontal: Normally aerated. Patent frontal sinus drainage pathways. Ethmoid: Normally aerated. Maxillary: Minimal left greater than right maxillary sinus mucosal thickening primarily involving the left alveolar recess. No fluid. Sphenoid: Normally aerated. Patent sphenoethmoidal recesses. Right ostiomeatal unit: Patent. Left ostiomeatal unit: Patent. Nasal passages: Patent. 4 mm rightward nasal septal  deviation. Paradoxical rotation of the middle turbinates. Anatomy: No pneumatization superior to anterior ethmoid notches. Keros II/III. Sellar sphenoid pneumatization pattern. No dehiscence of carotid or optic canals. No onodi cell. Other: Clear mastoid air cells and tympanic cavities. The visualized portion of the brain is unremarkable. Trace gas within the subcutaneous tissues of the right forehead and right medial canthal region of uncertain etiology and significance. No other abnormal venous or orbital gas seen elsewhere in the included face or head. IMPRESSION: Minimal mucosal thickening in  the maxillary sinuses.  No fluid. Electronically Signed   By: Logan Bores M.D.   On: 01/18/2018 15:20    Assessment & Plan:    Follow-up: No follow-ups on file.  Walker Kehr, MD

## 2019-02-06 ENCOUNTER — Other Ambulatory Visit: Payer: Self-pay | Admitting: Internal Medicine

## 2019-02-06 DIAGNOSIS — E785 Hyperlipidemia, unspecified: Secondary | ICD-10-CM

## 2019-02-06 NOTE — Progress Notes (Signed)
Coronary calcium CT

## 2019-02-15 ENCOUNTER — Ambulatory Visit
Admission: RE | Admit: 2019-02-15 | Discharge: 2019-02-15 | Disposition: A | Discharge status: Home / Self Care | Payer: Self-pay | Source: Ambulatory Visit | Attending: Internal Medicine | Admitting: Internal Medicine

## 2019-02-15 ENCOUNTER — Other Ambulatory Visit: Payer: Self-pay

## 2019-02-15 DIAGNOSIS — E785 Hyperlipidemia, unspecified: Secondary | ICD-10-CM

## 2019-02-15 IMAGING — CT CT CARDIAC CORONARY ARTERY CALCIUM SCORE
3 series · 14 of 20 positions shown, 15 images · non-contrast
Comparison: Chest CT [DATE]
COMPARISON: Chest CT [DATE]

Addendum:
EXAM:
OVER-READ INTERPRETATION  CT CHEST

The following report is an over-read performed by radiologist Dr.
TEKORIUS [REDACTED] on [DATE]. This over-read
does not include interpretation of cardiac or coronary anatomy or
pathology. The coronary calcium scoreinterpretation by the
cardiologist is attached.
CLINICAL DATA: Risk stratification
Coronary Calcium Score
TECHNIQUE: The patient was scanned on a Siemens Somatom 64 slice scanner. Axial
non-contrast 3 mm slices were carried out through the heart. The
data set was analyzed on a dedicated work station and scored using
the Agatson method.

[Series 2: casc 3.0 bv41 2 bestdiast 69 % · axial · 0.31mm/px · z∈[-198,-135]mm · 4 of 37 slices shown, 5 images]
[im 8/37  vessel]
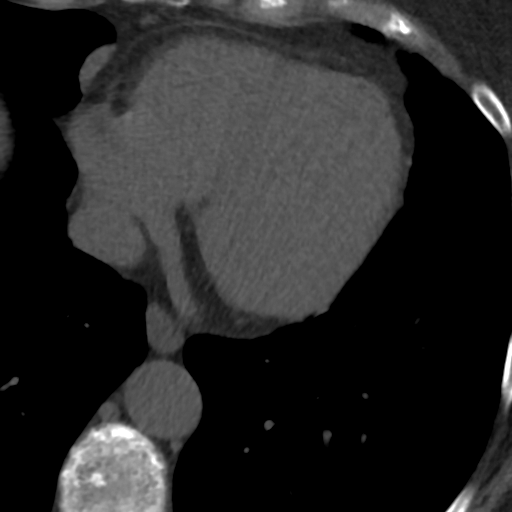
[im 8/37  lung]
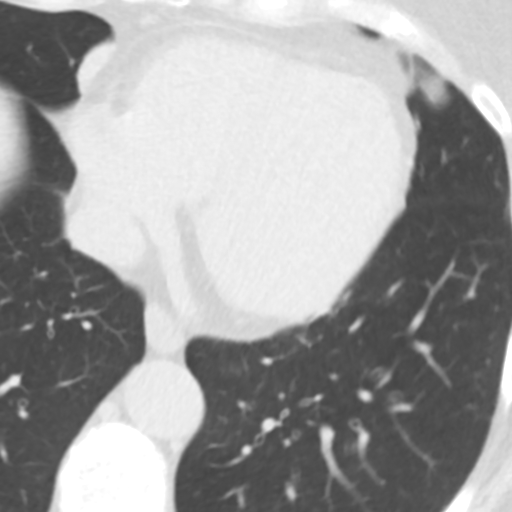
[im 15/37  vessel]
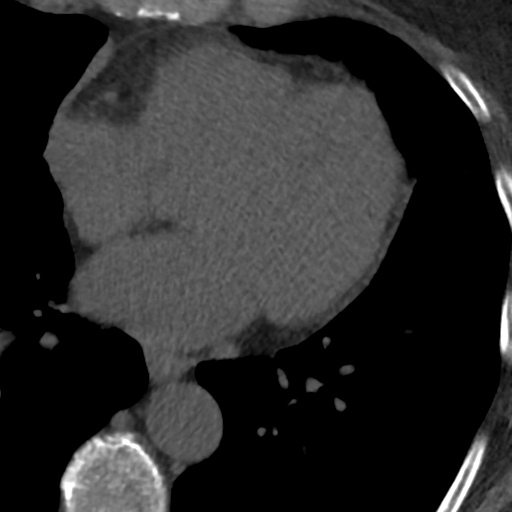
[im 22/37  vessel]
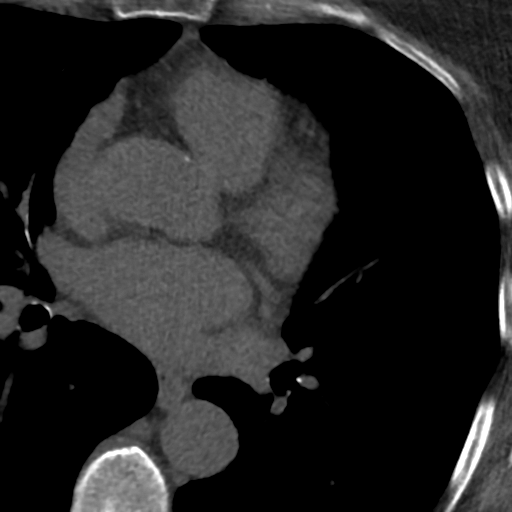
[im 29/37  vessel]
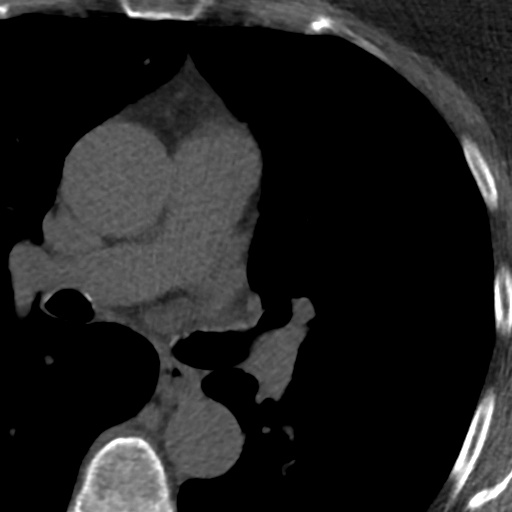

[Series 3: lung 69 % · axial · 0.63mm/px · z∈[-204,-132]mm · 5 of 38 slices shown]
[im 7/38  lung]
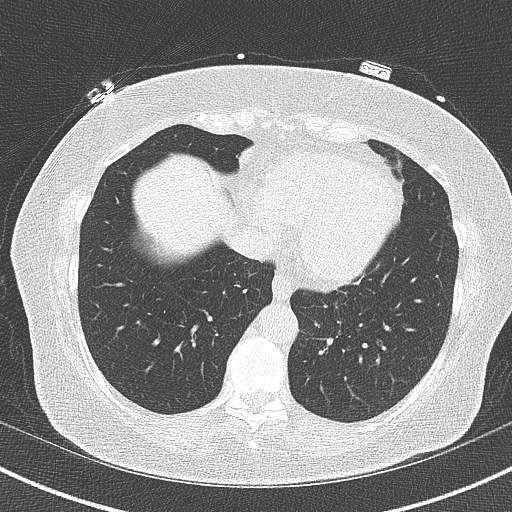
[im 13/38  lung]
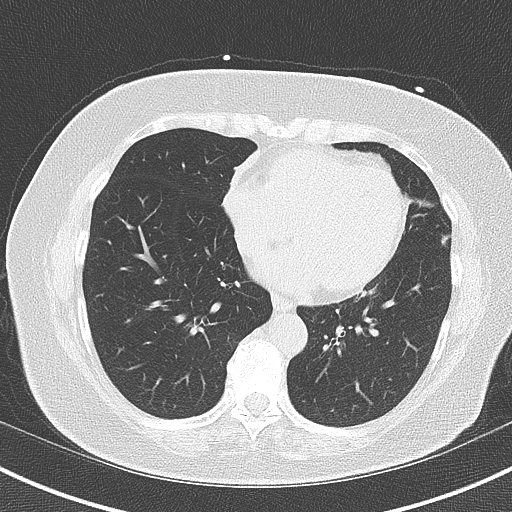
[im 19/38  lung]
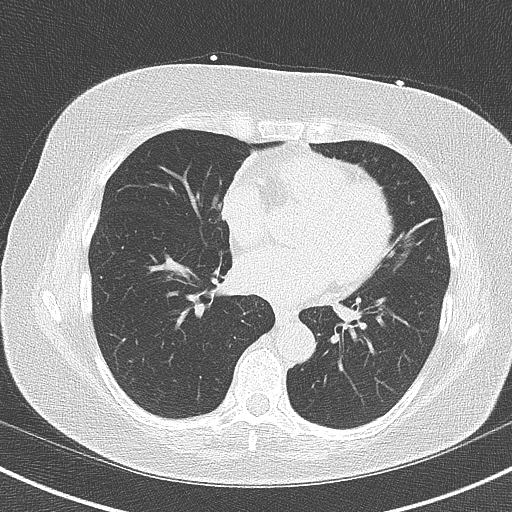
[im 25/38  lung]
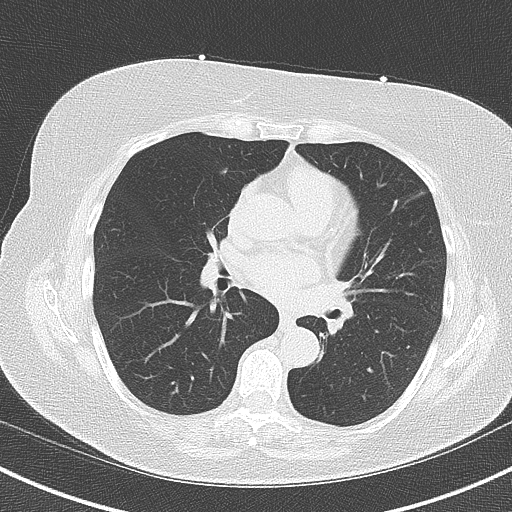
[im 31/38  lung]
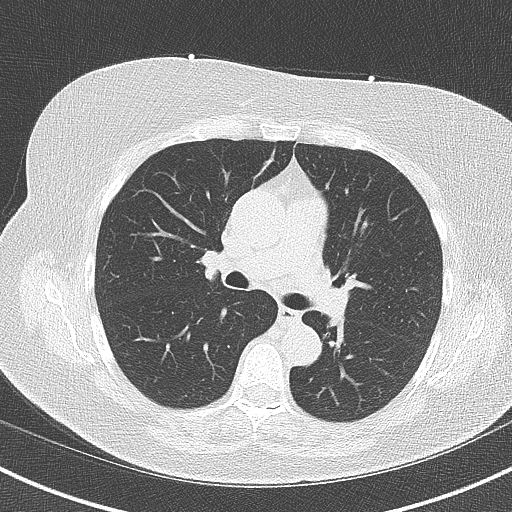

[Series 4: lung st 69 % · axial · 0.63mm/px · z∈[-204,-132]mm · 5 of 38 slices shown]
[im 7/38  lung]
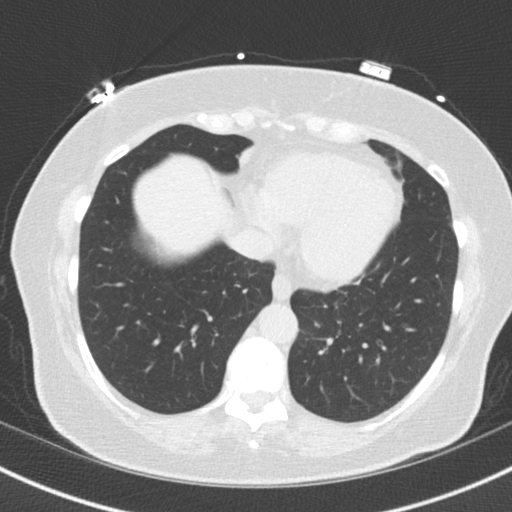
[im 13/38  lung]
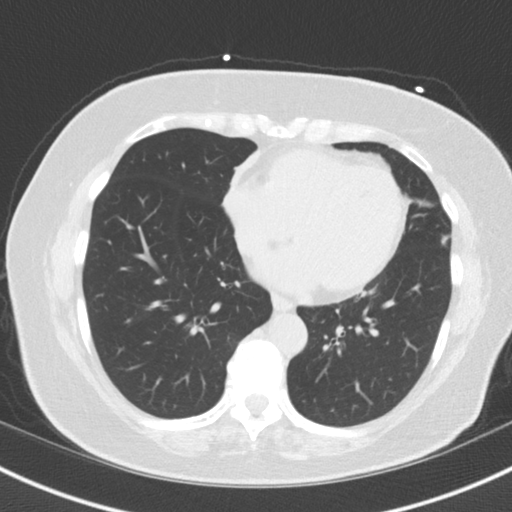
[im 19/38  lung]
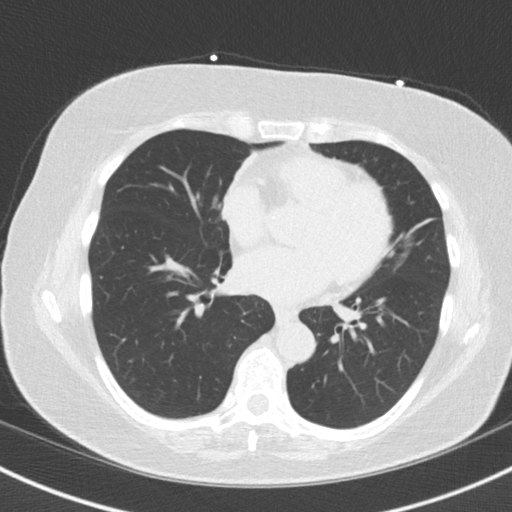
[im 25/38  lung]
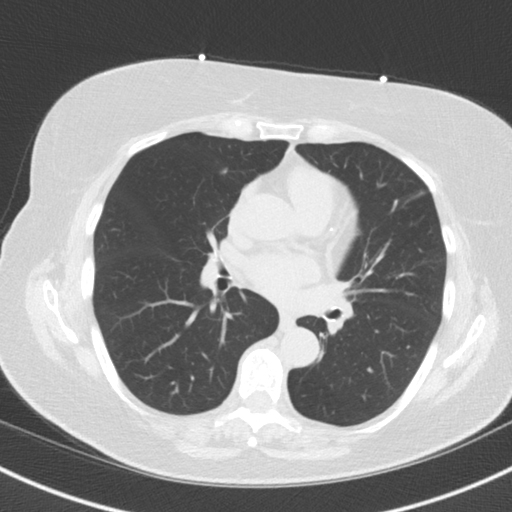
[im 31/38  lung]
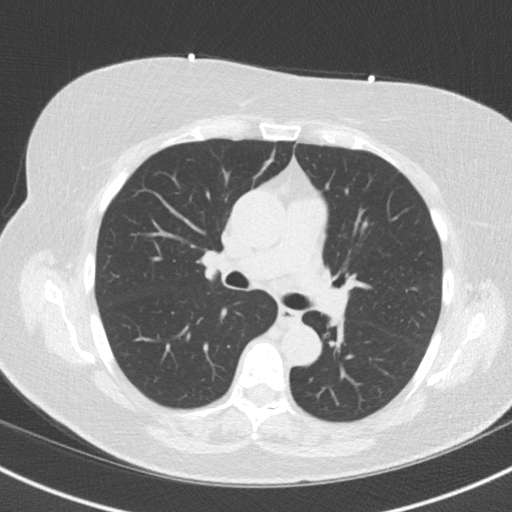

[14 of 20 positions shown; findings below may reference images not displayed]

FINDINGS: Vascular: Ascending thoracic aorta measures up to 3.5 cm. Small
amount of calcium at the aortic arch. Minimal calcification at the
aortic root. Normal caliber of the main pulmonary arteries. Heart
size is normal without significant pericardial fluid.

Mediastinum/Nodes: Visualized mediastinal structures are
unremarkable.

Lungs/Pleura: Mild soft tissue thickening along the medial aspect of
the right middle lobe along the mediastinum. This soft tissue
thickening along the anterior right mediastinum has progressed since
[BA] and favor scarring. Sub solid or ground-glass density in the
right lower lobe adjacent to the right major fissure best seen on
the sagittal images, sequence 6, image 17. This area measures 5 mm
and minimally changed since [BA]. Findings are suggestive for a
benign finding such as scarring or postinflammatory changes.
Scarring or atelectasis in the lingula. No significant airspace
disease in the visualized lungs.

Upper Abdomen: Negative

Musculoskeletal: No acute abnormality.
IMPRESSION: 1. No acute abnormality.
2. Stable 5 mm density in the right lower lobe. No significant
change since [BA] and most compatible with a benign finding.
FINDINGS: Non-cardiac: See separate report from [REDACTED].

Ascending aorta: Appears mildly dilated but measures upper normal at
3.7 cm relative to descending thoracic aorta that measures 2.3 cm

Pericardium: Normal

Coronary arteries: Two punctate areas of calcium noted one in
proximal LAD and one in mid LAD
IMPRESSION: Coronary calcium score of 6. This was 45 th percentile for age and
sex matched control.

TEKORIUS

*** End of Addendum ***
EXAM:
OVER-READ INTERPRETATION  CT CHEST

The following report is an over-read performed by radiologist Dr.
TEKORIUS [REDACTED] on [DATE]. This over-read
does not include interpretation of cardiac or coronary anatomy or
pathology. The coronary calcium scoreinterpretation by the
cardiologist is attached.
FINDINGS: Vascular: Ascending thoracic aorta measures up to 3.5 cm. Small
amount of calcium at the aortic arch. Minimal calcification at the
aortic root. Normal caliber of the main pulmonary arteries. Heart
size is normal without significant pericardial fluid.

Mediastinum/Nodes: Visualized mediastinal structures are
unremarkable.

Lungs/Pleura: Mild soft tissue thickening along the medial aspect of
the right middle lobe along the mediastinum. This soft tissue
thickening along the anterior right mediastinum has progressed since
[BA] and favor scarring. Sub solid or ground-glass density in the
right lower lobe adjacent to the right major fissure best seen on
the sagittal images, sequence 6, image 17. This area measures 5 mm
and minimally changed since [BA]. Findings are suggestive for a
benign finding such as scarring or postinflammatory changes.
Scarring or atelectasis in the lingula. No significant airspace
disease in the visualized lungs.

Upper Abdomen: Negative

Musculoskeletal: No acute abnormality.
IMPRESSION: 1. No acute abnormality.
2. Stable 5 mm density in the right lower lobe. No significant
change since [BA] and most compatible with a benign finding.

## 2019-03-14 ENCOUNTER — Other Ambulatory Visit: Payer: Self-pay

## 2019-03-14 ENCOUNTER — Ambulatory Visit: Payer: Medicare Other | Admitting: Family Medicine

## 2019-03-14 ENCOUNTER — Encounter: Payer: Self-pay | Admitting: Family Medicine

## 2019-03-14 VITALS — BP 130/80 | HR 66 | Ht 62.0 in | Wt 158.0 lb

## 2019-03-14 DIAGNOSIS — S39013A Strain of muscle, fascia and tendon of pelvis, initial encounter: Secondary | ICD-10-CM

## 2019-03-14 DIAGNOSIS — M2550 Pain in unspecified joint: Secondary | ICD-10-CM

## 2019-03-14 DIAGNOSIS — S39011A Strain of muscle, fascia and tendon of abdomen, initial encounter: Secondary | ICD-10-CM

## 2019-03-14 MED ORDER — KETOROLAC TROMETHAMINE 60 MG/2ML IM SOLN
60.0000 mg | Freq: Once | INTRAMUSCULAR | Status: AC
  Administered 2019-03-14: 60 mg via INTRAMUSCULAR

## 2019-03-14 MED ORDER — METHYLPREDNISOLONE ACETATE 80 MG/ML IJ SUSP
80.0000 mg | Freq: Once | INTRAMUSCULAR | Status: AC
  Administered 2019-03-14: 80 mg via INTRAMUSCULAR

## 2019-03-14 NOTE — Progress Notes (Signed)
[place] [address] [place] Phone: [phone] Subjective:   I Ellen [name] am serving as a Education administrator for Dr. Hulan Saas.  This visit occurred during the SARS-CoV-2 public health emergency.  Safety protocols were in place, including screening questions prior to the visit, additional usage of staff PPE, and extensive cleaning of exam room while observing appropriate contact time as indicated for disinfecting solutions.   I'm seeing this patient by the request  of:  Plotnikov, Evie Lacks, MD  CC: Right hip pain  [id number]   12/10/2018 69 year old female who had pedis anserine bursitis 70% improvement.  Encouraged still bracing, orthotics, icing regimen, we discussed other ergonomics that can be changes throughout the day.  Patient will follow up with me again as needed  Update 03/14/2019 Ellen [name] is a 69 y.o. female coming in with complaint of right hip pain. Patient slipped recently and pulled her groin and hip muscles. Happened 10 days ago. Slipped and fell walking in the house. Sharp pain that is improving. Patient is taking care of her husband. Patient fell with the leg extended.   Onset- 10 days ago Location - glut, groin  Duration-  Character- sharp  Aggravating factors- walking, ADLs Reliving factors-  Therapies tried- ice, heat, medication Severity-  6-7/10 at its worse      Past Medical History:  Diagnosis Date  . ALLERGIC RHINITIS   . ANXIETY   . GERD   . INSOMNIA-SLEEP DISORDER-UNSPEC   . Osteopenia 01/2016   T score -1.2 FRAX 15%/0.9%  . Shingles   . Thyroid disease    Past Surgical History:  Procedure Laterality Date  . ANKLE FRACTURE SURGERY  2012   R  . APPENDECTOMY    . BREAST SURGERY     REDUCTION MAMMOPLASTY  . CESAREAN SECTION     twice  . COSMETIC SURGERY    . FACELIFT     s/p  . TONSILLECTOMY  1974  . Upper nl TSH w/sx    . VAGINAL HYSTERECTOMY  1991   TVH, irregular bleeding     Social History   Socioeconomic History  . Marital status: Married    Spouse name: Not on file  . Number of children: 2  . Years of education: Not on file  . Highest education level: Not on file  Occupational History  . Occupation: Retired    Fish farm manager: SELF EMPLOYED  Tobacco Use  . Smoking status: Never Smoker  . Smokeless tobacco: Never Used  Substance and Sexual Activity  . Alcohol use: Yes    Alcohol/week: 10.0 standard drinks    Types: 10 Glasses of wine per week  . Drug use: No  . Sexual activity: Yes    Birth control/protection: Post-menopausal, Surgical    Comment: HYST-1st intercourse 69 yo-Fewer than 5 partners  Other Topics Concern  . Not on file  Social History Narrative   Regular exercise   2 biol children-1 disabled with schizophrenia & 1 stepchild   Social Determinants of Health   Financial Resource Strain:   . Difficulty of Paying Living Expenses: Not on file  Food Insecurity:   . Worried About Charity fundraiser in the Last Year: Not on file  . Ran Out of Food in the Last Year: Not on file  Transportation Needs:   . Lack of Transportation (Medical): Not on file  . Lack of Transportation (Non-Medical): Not on file  Physical Activity:   . Days of Exercise  per Week: Not on file  . Minutes of Exercise per Session: Not on file  Stress:   . Feeling of Stress : Not on file  Social Connections:   . Frequency of Communication with Friends and Family: Not on file  . Frequency of Social Gatherings with Friends and Family: Not on file  . Attends Religious Services: Not on file  . Active Member of Clubs or Organizations: Not on file  . Attends Archivist Meetings: Not on file  . Marital Status: Not on file   Allergies  Allergen Reactions  . Belviq [Lorcaserin Hcl]     palpitations  . Demerol [Meperidine]     palpitations  . Elavil [Amitriptyline Hcl]     constipation  . Latex Hives, Itching and Rash    Allergy  To latex, hives , redness,  itching ,rash  . Medical Adhesive Remover Hives, Itching and Rash   Family History  Problem Relation Age of Onset  . Heart disease Father        CAD/CABG in his 21's  . Hypertension Mother   . Heart failure Mother     Current Outpatient Medications (Endocrine & Metabolic):  .  levothyroxine (SYNTHROID) 125 MCG tablet, Take 1 tablet (125 mcg total) by mouth daily.   Current Outpatient Medications (Respiratory):  .  benzonatate (TESSALON PERLES) 100 MG capsule, Take 1-2 capsules (100-200 mg total) by mouth 3 (three) times daily as needed. .  fluticasone (FLONASE) 50 MCG/ACT nasal spray, Place 2 sprays into both nostrils daily.    Current Outpatient Medications (Other):  [name]  Cholecalciferol (VITAMIN D3) 2000 units capsule, Take 1 capsule (2,000 Units total) by mouth daily. .  citalopram (CELEXA) 20 MG tablet, Take 1 tablet (20 mg total) by mouth daily. Annual appt is due must see provider for future refills .  CRANBERRY EXTRACT PO, Take by mouth. .  Estradiol 10 MCG TABS vaginal tablet, Place 1 tablet (10 mcg total) vaginally 2 (two) times a week. .  gabapentin (NEURONTIN) 300 MG capsule, Take 1 capsule (300 mg total) by mouth 3 (three) times daily. .  Multiple Vitamin (MULTIVITAMIN) tablet, Take 1 tablet by mouth daily. Life's Abundance .  NON FORMULARY, Allerfex  1 by mouth once daily as needed .  NON FORMULARY,  .  pantoprazole (PROTONIX) 20 MG tablet, Take 1 tablet (20 mg total) by mouth daily. .  traZODone (DESYREL) 50 MG tablet, TAKE 1/2 TO 1 TABLET BY MOUTH AT BEDTIME AS NEEDED FOR SLEEP .  Valerian Root 100 MG CAPS, Take 1 capsule by mouth daily.   [name]  zinc gluconate 50 MG tablet, Take 50 mg by mouth daily. [name]  Zoster Vaccine Adjuvanted [facility]) injection,    Reviewed prior external information including notes and imaging from  primary care provider As well as notes that were available from care everywhere and other healthcare systems.  Past medical history, social,  surgical and family history all reviewed in electronic medical record.  No pertanent information unless stated regarding to the chief complaint.   Review of Systems:  No headache, visual changes, nausea, vomiting, diarrhea, constipation, dizziness, abdominal pain, skin rash, fevers, chills, night sweats, weight loss, swollen lymph nodes, body aches, joint swelling, chest pain, shortness of breath, mood changes. POSITIVE muscle aches  Objective  Blood pressure 130/80, pulse 66, height 5\' 2"  (1.575 m), weight 158 lb (71.7 kg), SpO2 95 %.   General: No apparent distress alert and oriented x3 mood and affect normal, dressed appropriately.  HEENT: Pupils equal, extraocular movements intact  Respiratory: Patient's speak in full sentences and does not appear short of breath  Cardiovascular: No lower extremity edema, non tender, no erythema  Skin: Warm dry intact with no signs of infection or rash on extremities or on axial skeleton.  Abdomen: Soft nontender  Neuro: Cranial nerves II through XII are intact, neurovascularly intact in all extremities with 2+ DTRs and 2+ pulses.  Lymph: No lymphadenopathy of posterior or anterior cervical chain or axillae bilaterally.  Gait normal with good balance and coordination.  MSK:  Non tender with full range of motion and good stability and symmetric strength and tone of shoulders, elbows, wrist, , knee and ankles bilaterally.  Right hip exam shows the patient does have pain with internal rotation laterally.  More pain with flexion of the hip.  Also with resisted adduction.  Negative straight leg test.  [id number]; 15 additional minutes spent for Therapeutic exercises as stated in above notes.  This included exercises focusing on stretching, strengthening, with significant focus on eccentric aspects.   Long term goals include an improvement in range of motion, strength, endurance as well as avoiding reinjury. Patient's frequency would include in 1-2 times a day, 3-5  times a week for a duration of 6-12 weeks. Hip strengthening exercises which included:  Pelvic tilt/bracing to help with proper recruitment of the lower abs and pelvic floor muscles  Glute strengthening to properly contract glutes without over-engaging low back and hamstrings - prone hip extension and glute bridge exercises Proper stretching techniques to increase effectiveness for the hip flexors, groin, quads, piriformic and low back when appropriate    Proper technique shown and discussed handout in great detail with ATC.  All questions were discussed and answered.     Impression and Recommendations:     This case required medical decision making of moderate complexity. The above documentation has been reviewed and is accurate and complete Lyndal Pulley, DO       Note: This dictation was prepared with Dragon dictation along with smaller phrase technology. Any transcriptional errors that result from this process are unintentional.

## 2019-03-14 NOTE — Assessment & Plan Note (Signed)
Right groin strain.  Discussed about icing regimen, home exercise, which was to avoid.  Patient is to increase activity slowly.  New onset.  Discussed thigh compression sleeve, work with Product/process development scientist to learn home exercises in greater detail, follow-up again in 4 to 6 weeks

## 2019-03-14 NOTE — Patient Instructions (Addendum)
Good to see you Thigh compression Exercise 3 times a week Small amount of Pennsaid over most painful spot See me again in 4-6 weeks

## 2019-04-07 ENCOUNTER — Ambulatory Visit: Payer: Medicare Other | Admitting: Podiatry

## 2019-04-07 ENCOUNTER — Other Ambulatory Visit: Payer: Self-pay

## 2019-04-07 DIAGNOSIS — M79674 Pain in right toe(s): Secondary | ICD-10-CM | POA: Diagnosis not present

## 2019-04-07 DIAGNOSIS — M2041 Other hammer toe(s) (acquired), right foot: Secondary | ICD-10-CM | POA: Diagnosis not present

## 2019-04-07 DIAGNOSIS — L84 Corns and callosities: Secondary | ICD-10-CM | POA: Diagnosis not present

## 2019-04-07 DIAGNOSIS — M2042 Other hammer toe(s) (acquired), left foot: Secondary | ICD-10-CM

## 2019-04-11 ENCOUNTER — Other Ambulatory Visit: Payer: Self-pay | Admitting: *Deleted

## 2019-04-11 MED ORDER — ESTRADIOL 10 MCG VA TABS: 1.0000 | ORAL_TABLET | VAGINAL | 9 refills | Status: DC

## 2019-04-11 NOTE — Telephone Encounter (Signed)
Per note on 02/03/19 "Using Vagifem once weekly which is fine as less is better if effective, and we reviewed rare risks with systemic absorption."  Rx sent.

## 2019-04-12 NOTE — Progress Notes (Signed)
Subjective:   Patient ID: Ellen [name], female   DOB: 69 y.o.   MRN: [id number]   HPI 69 year old female presents the office today for concerns of pain to her right fifth toe.  She is not sure if the nail is coming through the skin but she gets discomfort in this area.  She also has noticed some chapped skin at the tip of the right third toe.  She visits mostly with shoes and pressure which is most of the discomfort.  She also exercycle and biking shoes also irritates the area.  No drainage or pus.  No significant swelling or redness.   Review of Systems  All other systems reviewed and are negative.  Past Medical History:  Diagnosis Date  . ALLERGIC RHINITIS   . ANXIETY   . GERD   . INSOMNIA-SLEEP DISORDER-UNSPEC   . Osteopenia 01/2016   T score -1.2 FRAX 15%/0.9%  . Shingles   . Thyroid disease     Past Surgical History:  Procedure Laterality Date  . ANKLE FRACTURE SURGERY  2012   R  . APPENDECTOMY    . BREAST SURGERY     REDUCTION MAMMOPLASTY  . CESAREAN SECTION     twice  . COSMETIC SURGERY    . FACELIFT     s/p  . TONSILLECTOMY  1974  . Upper nl TSH w/sx    . VAGINAL HYSTERECTOMY  1991   TVH, irregular bleeding     Current Outpatient Medications:  .  benzonatate (TESSALON PERLES) 100 MG capsule, Take 1-2 capsules (100-200 mg total) by mouth 3 (three) times daily as needed., Disp: 60 capsule, Rfl: 1 .  Cholecalciferol (VITAMIN D3) 2000 units capsule, Take 1 capsule (2,000 Units total) by mouth daily., Disp: 100 capsule, Rfl: 3 .  citalopram (CELEXA) 20 MG tablet, Take 1 tablet (20 mg total) by mouth daily. Annual appt is due must see provider for future refills, Disp: 90 tablet, Rfl: 3 .  CRANBERRY EXTRACT PO, Take by mouth., Disp: , Rfl:  .  Estradiol 10 MCG TABS vaginal tablet, Place 1 tablet (10 mcg total) vaginally 2 (two) times a week., Disp: 8 tablet, Rfl: 9 .  fluticasone (FLONASE) 50 MCG/ACT nasal spray, Place 2 sprays into both nostrils daily., Disp:  11.1 g, Rfl: 2 .  gabapentin (NEURONTIN) 300 MG capsule, Take 1 capsule (300 mg total) by mouth 3 (three) times daily., Disp: 90 capsule, Rfl: 3 .  levothyroxine (SYNTHROID) 125 MCG tablet, Take 1 tablet (125 mcg total) by mouth daily., Disp: 90 tablet, Rfl: 3 .  Multiple Vitamin (MULTIVITAMIN) tablet, Take 1 tablet by mouth daily. Life's Abundance, Disp: , Rfl:  .  NON FORMULARY, Allerfex  1 by mouth once daily as needed, Disp: , Rfl:  .  NON FORMULARY, , Disp: , Rfl:  .  pantoprazole (PROTONIX) 20 MG tablet, Take 1 tablet (20 mg total) by mouth daily., Disp: 90 tablet, Rfl: 3 .  traZODone (DESYREL) 50 MG tablet, TAKE 1/2 TO 1 TABLET BY MOUTH AT BEDTIME AS NEEDED FOR SLEEP, Disp: 90 tablet, Rfl: 1 .  Valerian Root 100 MG CAPS, Take 1 capsule by mouth daily.  , Disp: , Rfl:  .  zinc gluconate 50 MG tablet, Take 50 mg by mouth daily., Disp: , Rfl:  .  Zoster Vaccine Adjuvanted [facility]) injection, , Disp: , Rfl:   Allergies  Allergen Reactions  . Belviq [Lorcaserin Hcl]     palpitations  . Demerol [Meperidine]  palpitations  . Elavil [Amitriptyline Hcl]     constipation  . Latex Hives, Itching and Rash    Allergy  To latex, hives , redness, itching ,rash  . Medical Adhesive Remover Hives, Itching and Rash         Objective:  Physical Exam  General: AAO x3, NAD  Dermatological: Hyperkeratotic lesion the distal lateral aspect of the right fifth toe as above the majority tenderness is localized.  Also minimal callus of the distal aspect of right third toe.  Hammertoe contractures are present.  Further varus present fifth digit.  No open lesions.  Vascular: Dorsalis Pedis artery and Posterior Tibial artery pedal pulses are 2/4 bilateral with immedate capillary fill time. There is no pain with calf compression, swelling, warmth, erythema.   Neruologic: Grossly intact via light touch bilateral.   Musculoskeletal: Hammertoe contractures present with adductovarus of the fifth digits.   Muscular strength 5/5 in all groups tested bilateral.  Gait: Unassisted, Nonantalgic.       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  Discussed shoe modifications.  Dispensed toe crest as well as offloading pads that she can try.  Trula Slade DPM

## 2019-04-14 ENCOUNTER — Ambulatory Visit: Payer: Medicare Other | Admitting: Family Medicine

## 2019-04-14 ENCOUNTER — Other Ambulatory Visit: Payer: Self-pay

## 2019-04-14 ENCOUNTER — Encounter: Payer: Self-pay | Admitting: Family Medicine

## 2019-04-14 DIAGNOSIS — S39011A Strain of muscle, fascia and tendon of abdomen, initial encounter: Secondary | ICD-10-CM

## 2019-04-14 DIAGNOSIS — S39013A Strain of muscle, fascia and tendon of pelvis, initial encounter: Secondary | ICD-10-CM

## 2019-04-14 NOTE — Assessment & Plan Note (Signed)
Patient has made significant strides in symptoms about 85 to 95% better at this time.  Increase activity as tolerated.  Continue the thigh compression on a regular basis for another month.  Otherwise follow-up with me as needed

## 2019-04-14 NOTE — Patient Instructions (Addendum)
Continue to wear brace with activity 4-8 weeks Ice 20 min at end of day See me in 8 weeks if not perfect

## 2019-04-14 NOTE — Progress Notes (Signed)
[facility] [place] Phone: [phone] Subjective:   [name], am serving as a scribe for Dr. Hulan Saas. This visit occurred during the SARS-CoV-2 public health emergency.  Safety protocols were in place, including screening questions prior to the visit, additional usage of staff PPE, and extensive cleaning of exam room while observing appropriate contact time as indicated for disinfecting solutions.   I'm seeing this patient by the request  of:  Plotnikov, Evie Lacks, MD  CC: Groin strain follow-up  [id number]   03/14/2019 Right groin strain.  Discussed about icing regimen, home exercise, which was to avoid.  Patient is to increase activity slowly.  New onset.  Discussed thigh compression sleeve, work with athletic trainer to learn home exercises in greater detail, follow-up again in 4 to 6 weeks  Update 04/14/2019 Gustie Menna Hladky is a 69 y.o. female coming in with complaint of right groin strain. Patient states that her pain has improved. Pain with squatting down. Tries to modify her work with Chief Strategy Officer. Thigh compression sleeve does help.      Past Medical History:  Diagnosis Date  . ALLERGIC RHINITIS   . ANXIETY   . GERD   . INSOMNIA-SLEEP DISORDER-UNSPEC   . Osteopenia 01/2016   T score -1.2 FRAX 15%/0.9%  . Shingles   . Thyroid disease    Past Surgical History:  Procedure Laterality Date  . ANKLE FRACTURE SURGERY  2012   R  . APPENDECTOMY    . BREAST SURGERY     REDUCTION MAMMOPLASTY  . CESAREAN SECTION     twice  . COSMETIC SURGERY    . FACELIFT     s/p  . TONSILLECTOMY  1974  . Upper nl TSH w/sx    . VAGINAL HYSTERECTOMY  1991   TVH, irregular bleeding   Social History   Socioeconomic History  . Marital status: Married    Spouse name: Not on file  . Number of children: 2  . Years of education: Not on file  . Highest education level: Not on file  Occupational History    . Occupation: Retired    Fish farm manager: SELF EMPLOYED  Tobacco Use  . Smoking status: Never Smoker  . Smokeless tobacco: Never Used  Substance and Sexual Activity  . Alcohol use: Yes    Alcohol/week: 10.0 standard drinks    Types: 10 Glasses of wine per week  . Drug use: No  . Sexual activity: Yes    Birth control/protection: Post-menopausal, Surgical    Comment: HYST-1st intercourse 69 yo-Fewer than 5 partners  Other Topics Concern  . Not on file  Social History Narrative   Regular exercise   2 biol children-1 disabled with schizophrenia & 1 stepchild   Social Determinants of Health   Financial Resource Strain:   . Difficulty of Paying Living Expenses:   Food Insecurity:   . Worried About Charity fundraiser in the Last Year:   . Arboriculturist in the Last Year:   Transportation Needs:   . Film/video editor (Medical):   [name] Lack of Transportation (Non-Medical):   Physical Activity:   . Days of Exercise per Week:   . Minutes of Exercise per Session:   Stress:   . Feeling of Stress :   Social Connections:   . Frequency of Communication with Friends and Family:   . Frequency of Social Gatherings with Friends and Family:   . Attends  Religious Services:   . Active Member of Clubs or Organizations:   . Attends Archivist Meetings:   [name] Marital Status:    Allergies  Allergen Reactions  . Belviq [Lorcaserin Hcl]     palpitations  . Demerol [Meperidine]     palpitations  . Elavil [Amitriptyline Hcl]     constipation  . Latex Hives, Itching and Rash    Allergy  To latex, hives , redness, itching ,rash  . Medical Adhesive Remover Hives, Itching and Rash   Family History  Problem Relation Age of Onset  . Heart disease Father        CAD/CABG in his 38's  . Hypertension Mother   . Heart failure Mother     Current Outpatient Medications (Endocrine & Metabolic):  .  levothyroxine (SYNTHROID) 125 MCG tablet, Take 1 tablet (125 mcg total) by mouth  daily.   Current Outpatient Medications (Respiratory):  .  benzonatate (TESSALON PERLES) 100 MG capsule, Take 1-2 capsules (100-200 mg total) by mouth 3 (three) times daily as needed. .  fluticasone (FLONASE) 50 MCG/ACT nasal spray, Place 2 sprays into both nostrils daily.    Current Outpatient Medications (Other):  [name]  Cholecalciferol (VITAMIN D3) 2000 units capsule, Take 1 capsule (2,000 Units total) by mouth daily. .  citalopram (CELEXA) 20 MG tablet, Take 1 tablet (20 mg total) by mouth daily. Annual appt is due must see provider for future refills .  CRANBERRY EXTRACT PO, Take by mouth. .  Estradiol 10 MCG TABS vaginal tablet, Place 1 tablet (10 mcg total) vaginally 2 (two) times a week. .  gabapentin (NEURONTIN) 300 MG capsule, Take 1 capsule (300 mg total) by mouth 3 (three) times daily. .  Multiple Vitamin (MULTIVITAMIN) tablet, Take 1 tablet by mouth daily. Life's Abundance .  NON FORMULARY, Allerfex  1 by mouth once daily as needed .  NON FORMULARY,  .  pantoprazole (PROTONIX) 20 MG tablet, Take 1 tablet (20 mg total) by mouth daily. .  traZODone (DESYREL) 50 MG tablet, TAKE 1/2 TO 1 TABLET BY MOUTH AT BEDTIME AS NEEDED FOR SLEEP .  Valerian Root 100 MG CAPS, Take 1 capsule by mouth daily.   [name]  zinc gluconate 50 MG tablet, Take 50 mg by mouth daily. [name]  Zoster Vaccine Adjuvanted [facility]) injection,    Reviewed prior external information including notes and imaging from  primary care provider As well as notes that were available from care everywhere and other healthcare systems.  Past medical history, social, surgical and family history all reviewed in electronic medical record.  No pertanent information unless stated regarding to the chief complaint.   Review of Systems:  No headache, visual changes, nausea, vomiting, diarrhea, constipation, dizziness, abdominal pain, skin rash, fevers, chills, night sweats, weight loss, swollen lymph nodes, body aches, joint swelling, chest  pain, shortness of breath, mood changes. POSITIVE muscle aches  Objective  Blood pressure 124/82, pulse 74, height 5\' 2"  (1.575 m), weight 156 lb (70.8 kg), SpO2 97 %.   General: No apparent distress alert and oriented x3 mood and affect normal, dressed appropriately.  HEENT: Pupils equal, extraocular movements intact  Respiratory: Patient's speak in full sentences and does not appear short of breath  Cardiovascular: No lower extremity edema, non tender, no erythema  Neuro: Cranial nerves II through XII are intact, neurovascularly intact in all extremities with 2+ DTRs and 2+ pulses.  Gait normal with good balance and coordination.  MSK:  tender with full range of  motion and good stability and symmetric strength and tone of shoulders, elbows, wrist, knee and ankles bilaterally.  Mild arthritic changes of multiple joints Hip exam bilaterally shows the patient has good range of motion.  Minimally tender on the right hip with resisted adduction in the groin area but nothing as severe as previously.  On palpation today nontender.  Good neurovascularly intact distally.  Deep tendon reflexes intact    Impression and Recommendations:     The above documentation has been reviewed and is accurate and complete Lyndal Pulley, DO       Note: This dictation was prepared with Dragon dictation along with smaller phrase technology. Any transcriptional errors that result from this process are unintentional.

## 2019-05-09 ENCOUNTER — Encounter: Payer: Self-pay | Admitting: Internal Medicine

## 2019-05-09 ENCOUNTER — Other Ambulatory Visit: Payer: Self-pay | Admitting: Internal Medicine

## 2019-05-09 ENCOUNTER — Other Ambulatory Visit: Payer: Self-pay

## 2019-05-09 ENCOUNTER — Ambulatory Visit (INDEPENDENT_AMBULATORY_CARE_PROVIDER_SITE_OTHER): Payer: Medicare Other | Admitting: Internal Medicine

## 2019-05-09 DIAGNOSIS — R140 Abdominal distension (gaseous): Secondary | ICD-10-CM

## 2019-05-09 DIAGNOSIS — R1011 Right upper quadrant pain: Secondary | ICD-10-CM

## 2019-05-09 DIAGNOSIS — R112 Nausea with vomiting, unspecified: Secondary | ICD-10-CM

## 2019-05-09 DIAGNOSIS — R110 Nausea: Secondary | ICD-10-CM | POA: Diagnosis not present

## 2019-05-09 LAB — URINALYSIS, ROUTINE W REFLEX MICROSCOPIC
Bilirubin Urine: NEGATIVE
Hgb urine dipstick: NEGATIVE
Ketones, ur: NEGATIVE
Nitrite: NEGATIVE
Specific Gravity, Urine: 1.02 (ref 1.000–1.030)
Total Protein, Urine: NEGATIVE
Urine Glucose: NEGATIVE
Urobilinogen, UA: 0.2 (ref 0.0–1.0)
pH: 7 (ref 5.0–8.0)

## 2019-05-09 LAB — CBC WITH DIFFERENTIAL/PLATELET
Basophils Absolute: 0.1 10*3/uL (ref 0.0–0.1)
Basophils Relative: 1.1 % (ref 0.0–3.0)
Eosinophils Absolute: 0.3 10*3/uL (ref 0.0–0.7)
Eosinophils Relative: 4.4 % (ref 0.0–5.0)
HCT: 41.7 % (ref 36.0–46.0)
Hemoglobin: 14 g/dL (ref 12.0–15.0)
Lymphocytes Relative: 40.3 % (ref 12.0–46.0)
Lymphs Abs: 2.3 10*3/uL (ref 0.7–4.0)
MCHC: 33.5 g/dL (ref 30.0–36.0)
MCV: 95.9 fl (ref 78.0–100.0)
Monocytes Absolute: 0.6 10*3/uL (ref 0.1–1.0)
Monocytes Relative: 9.7 % (ref 3.0–12.0)
Neutro Abs: 2.6 10*3/uL (ref 1.4–7.7)
Neutrophils Relative %: 44.5 % (ref 43.0–77.0)
Platelets: 215 10*3/uL (ref 150.0–400.0)
RBC: 4.35 Mil/uL (ref 3.87–5.11)
RDW: 14 % (ref 11.5–15.5)
WBC: 5.8 10*3/uL (ref 4.0–10.5)

## 2019-05-09 LAB — BASIC METABOLIC PANEL
BUN: 18 mg/dL (ref 6–23)
CO2: 28 mEq/L (ref 19–32)
Calcium: 9.2 mg/dL (ref 8.4–10.5)
Chloride: 104 mEq/L (ref 96–112)
Creatinine, Ser: 0.8 mg/dL (ref 0.40–1.20)
GFR: 70.78 mL/min (ref >60.00)
Glucose, Bld: 91 mg/dL (ref 70–99)
Potassium: 4.4 mEq/L (ref 3.5–5.1)
Sodium: 139 mEq/L (ref 135–145)

## 2019-05-09 LAB — HEPATIC FUNCTION PANEL
ALT: 14 U/L (ref 0–35)
AST: 20 U/L (ref 0–37)
Albumin: 4.2 g/dL (ref 3.5–5.2)
Alkaline Phosphatase: 85 U/L (ref 39–117)
Bilirubin, Direct: 0 mg/dL (ref 0.0–0.3)
Total Bilirubin: 0.4 mg/dL (ref 0.2–1.2)
Total Protein: 6.9 g/dL (ref 6.0–8.3)

## 2019-05-09 LAB — LIPASE: Lipase: 44 U/L (ref 11.0–59.0)

## 2019-05-09 MED ORDER — CIPROFLOXACIN HCL 250 MG PO TABS: 250.0000 mg | ORAL_TABLET | Freq: Two times a day (BID) | ORAL | 0 refills | Status: DC

## 2019-05-09 NOTE — Addendum Note (Signed)
Addended by: Trenda Moots on: [id number] 09:15 AM   Modules accepted: Orders

## 2019-05-09 NOTE — Assessment & Plan Note (Addendum)
On Protonix Abd [place], pelvic US - ok in 2019 EGD, colon is due next month  Abd CT Labs

## 2019-05-09 NOTE — Assessment & Plan Note (Signed)
On Protonix Abd [place], pelvic US - ok in 2019 EGD, colon is due next month  Abd CT Labs

## 2019-05-09 NOTE — Progress Notes (Signed)
Subjective:  Patient ID: Ellen [name], female    DOB: [birth year]  Age: 69 y.o. MRN: [id number]  CC: No chief complaint on file.   HPI Syndey Lashawne Rowlee presents for wt gain, dry skin, difficulty urinating w/burning C/oR flank/back pain, nausea x 6 months - worse over past 2 wks Abd [place], pelvic US - ok in 2019 EGD, colon is due next month  Outpatient Medications Prior to Visit  Medication Sig Dispense Refill  . benzonatate (TESSALON PERLES) 100 MG capsule Take 1-2 capsules (100-200 mg total) by mouth 3 (three) times daily as needed. 60 capsule 1  . Cholecalciferol (VITAMIN D3) 2000 units capsule Take 1 capsule (2,000 Units total) by mouth daily. 100 capsule 3  . citalopram (CELEXA) 20 MG tablet Take 1 tablet (20 mg total) by mouth daily. Annual appt is due must see provider for future refills 90 tablet 3  . CRANBERRY EXTRACT PO Take by mouth.    . Estradiol 10 MCG TABS vaginal tablet Place 1 tablet (10 mcg total) vaginally 2 (two) times a week. 8 tablet 9  . fluticasone (FLONASE) 50 MCG/ACT nasal spray Place 2 sprays into both nostrils daily. 11.1 g 2  . gabapentin (NEURONTIN) 300 MG capsule Take 1 capsule (300 mg total) by mouth 3 (three) times daily. 90 capsule 3  . levothyroxine (SYNTHROID) 125 MCG tablet Take 1 tablet (125 mcg total) by mouth daily. 90 tablet 3  . Multiple Vitamin (MULTIVITAMIN) tablet Take 1 tablet by mouth daily. Life's Abundance    . NON FORMULARY Allerfex  1 by mouth once daily as needed    . NON FORMULARY     . pantoprazole (PROTONIX) 20 MG tablet Take 1 tablet (20 mg total) by mouth daily. 90 tablet 3  . traZODone (DESYREL) 50 MG tablet TAKE 1/2 TO 1 TABLET BY MOUTH AT BEDTIME AS NEEDED FOR SLEEP 90 tablet 1  . Valerian Root 100 MG CAPS Take 1 capsule by mouth daily.      [name] zinc gluconate 50 MG tablet Take 50 mg by mouth daily.    [name] Zoster Vaccine Adjuvanted [facility]) injection      No facility-administered medications prior to visit.     ROS: Review of Systems  Constitutional: Positive for unexpected weight change. Negative for activity change, appetite change, chills and fatigue.  HENT: Negative for congestion, mouth sores and sinus pressure.   Eyes: Negative for visual disturbance.  Respiratory: Negative for cough and chest tightness.   Gastrointestinal: Positive for abdominal pain. Negative for nausea.  Genitourinary: Negative for difficulty urinating, frequency and vaginal pain.  Musculoskeletal: Positive for back pain. Negative for gait problem.  Skin: Negative for pallor and rash.  Neurological: Negative for dizziness, tremors, weakness, numbness and headaches.  Psychiatric/Behavioral: Negative for confusion, sleep disturbance and suicidal ideas.    Objective:  BP 126/80 (BP Location: Left Arm, Patient Position: Sitting, Cuff Size: Normal)   Pulse 61   Temp 97.8 F (36.6 C) (Oral)   Ht 5\' 2"  (1.575 m)   Wt 157 lb (71.2 kg)   SpO2 95%   BMI 28.72 kg/m   BP Readings from Last 3 Encounters:  05/09/19 126/80  04/14/19 124/82  03/14/19 130/80    Wt Readings from Last 3 Encounters:  05/09/19 157 lb (71.2 kg)  04/14/19 156 lb (70.8 kg)  03/14/19 158 lb (71.7 kg)    Physical Exam Constitutional:      General: She is not in acute distress.    Appearance:  She is well-developed.  HENT:     Head: Normocephalic.     Right Ear: External ear normal.     Left Ear: External ear normal.     Nose: Nose normal.  Eyes:     General:        Right eye: No discharge.        Left eye: No discharge.     Conjunctiva/sclera: Conjunctivae normal.     Pupils: Pupils are equal, round, and reactive to light.  Neck:     Thyroid: No thyromegaly.     Vascular: No JVD.     Trachea: No tracheal deviation.  Cardiovascular:     Rate and Rhythm: Normal rate and regular rhythm.     Heart sounds: Normal heart sounds.  Pulmonary:     Effort: No respiratory distress.     Breath sounds: No stridor. No wheezing.   Abdominal:     General: Bowel sounds are normal. There is no distension.     Palpations: Abdomen is soft. There is no mass.     Tenderness: There is no abdominal tenderness. There is no guarding or rebound.  Musculoskeletal:        General: No tenderness.     Cervical back: Normal range of motion and neck supple.  Lymphadenopathy:     Cervical: No cervical adenopathy.  Skin:    Findings: No erythema or rash.  Neurological:     Mental Status: She is oriented to person, place, and time.     Cranial Nerves: No cranial nerve deficit.     Motor: No abnormal muscle tone.     Coordination: Coordination normal.     Deep Tendon Reflexes: Reflexes normal.  Psychiatric:        Behavior: Behavior normal.        Thought Content: Thought content normal.        Judgment: Judgment normal.     Lab Results  Component Value Date   WBC 4.8 02/03/2019   HGB 13.8 02/03/2019   HCT 42.0 02/03/2019   PLT 228.0 02/03/2019   GLUCOSE 90 02/03/2019   CHOL 233 (H) 02/03/2019   TRIG 166.0 (H) 02/03/2019   HDL 72.30 02/03/2019   LDLDIRECT 142.6 07/09/2011   LDLCALC 127 (H) 02/03/2019   ALT 13 02/03/2019   AST 21 02/03/2019   NA 140 02/03/2019   K 4.4 02/03/2019   CL 106 02/03/2019   CREATININE 0.76 02/03/2019   BUN 14 02/03/2019   CO2 28 02/03/2019   TSH 2.33 02/03/2019   HGBA1C 5.6 10/12/2006    CT CARDIAC SCORING  Addendum Date: 02/15/2019   ADDENDUM REPORT: 02/15/2019 15:01 CLINICAL DATA:  Risk stratification EXAM: Coronary Calcium Score TECHNIQUE: The patient was scanned on a Siemens Somatom 64 slice scanner. Axial non-contrast 3 mm slices were carried out through the heart. The data set was analyzed on a dedicated work station and scored using the [place]. FINDINGS: Non-cardiac: See separate report from [facility]. Ascending aorta: Appears mildly dilated but measures upper normal at 3.7 cm relative to descending thoracic aorta that measures 2.3 cm Pericardium: Normal Coronary  arteries: Two punctate areas of calcium noted one in proximal LAD and one in mid LAD IMPRESSION: Coronary calcium score of 6. This was 71 th percentile for age and sex matched control. Jenkins Rouge Electronically Signed   By: Jenkins Rouge M.D.   On: 02/15/2019 15:01   Result Date: 02/15/2019 EXAM: OVER-READ INTERPRETATION  CT CHEST The following report is  an over-read performed by radiologist Dr. Markus Daft of [facility], [place] on 02/15/2019. This over-read does not include interpretation of cardiac or coronary anatomy or pathology. The coronary calcium scoreinterpretation by the cardiologist is attached. COMPARISON:  Chest CT 05/07/2005 FINDINGS: Vascular: Ascending thoracic aorta measures up to 3.5 cm. Small amount of calcium at the aortic arch. Minimal calcification at the aortic root. Normal caliber of the main pulmonary arteries. Heart size is normal without significant pericardial fluid. Mediastinum/Nodes: Visualized mediastinal structures are unremarkable. Lungs/Pleura: Mild soft tissue thickening along the medial aspect of the right middle lobe along the mediastinum. This soft tissue thickening along the anterior right mediastinum has progressed since 2007 and favor scarring. Sub solid or ground-glass density in the right lower lobe adjacent to the right major fissure best seen on the sagittal images, sequence 6, image 17. This area measures 5 mm and minimally changed since 2007. Findings are suggestive for a benign finding such as scarring or postinflammatory changes. Scarring or atelectasis in the lingula. No significant airspace disease in the visualized lungs. Upper Abdomen: Negative Musculoskeletal: No acute abnormality. IMPRESSION: 1. No acute abnormality. 2. Stable 5 mm density in the right lower lobe. No significant change since 2007 and most compatible with a benign finding. Electronically Signed: By: Markus Daft M.D. On: 02/15/2019 14:27    Assessment & Plan:   There are no diagnoses  linked to this encounter.   No orders of the defined types were placed in this encounter.    Follow-up: No follow-ups on file.  Walker Kehr, MD

## 2019-05-12 ENCOUNTER — Other Ambulatory Visit: Payer: Self-pay

## 2019-05-12 ENCOUNTER — Ambulatory Visit (INDEPENDENT_AMBULATORY_CARE_PROVIDER_SITE_OTHER)
Admission: RE | Admit: 2019-05-12 | Discharge: 2019-05-12 | Disposition: A | Discharge status: Home / Self Care | Payer: Medicare Other | Source: Ambulatory Visit | Attending: Internal Medicine | Admitting: Internal Medicine

## 2019-05-12 DIAGNOSIS — R112 Nausea with vomiting, unspecified: Secondary | ICD-10-CM | POA: Diagnosis not present

## 2019-05-12 DIAGNOSIS — R1011 Right upper quadrant pain: Secondary | ICD-10-CM

## 2019-05-12 DIAGNOSIS — R140 Abdominal distension (gaseous): Secondary | ICD-10-CM

## 2019-05-12 DIAGNOSIS — R1110 Vomiting, unspecified: Secondary | ICD-10-CM | POA: Diagnosis not present

## 2019-05-12 IMAGING — CT CT ABD-PELV W/ CM
2 of 5 series · 17 of 46 positions shown, 19 images · IV contrast (OMNIPAQUE 300)
Comparison: None.

CLINICAL DATA: Worsening right-sided abdominal pain, distention,
and nausea and vomiting for several months. Urinary tract infection.

EXAM:
CT ABDOMEN AND PELVIS WITH CONTRAST
TECHNIQUE: Multidetector CT imaging of the abdomen and pelvis was performed
using the standard protocol following bolus administration of
intravenous contrast.
CONTRAST:  100mL OMNIPAQUE IOHEXOL 300 MG/ML  SOLN

[Series 2: abd/pel w · axial · 0.62mm/px · z∈[-404,-48]mm · 14 of 79 slices shown, 16 images]
[im 4/79  soft-tissue]
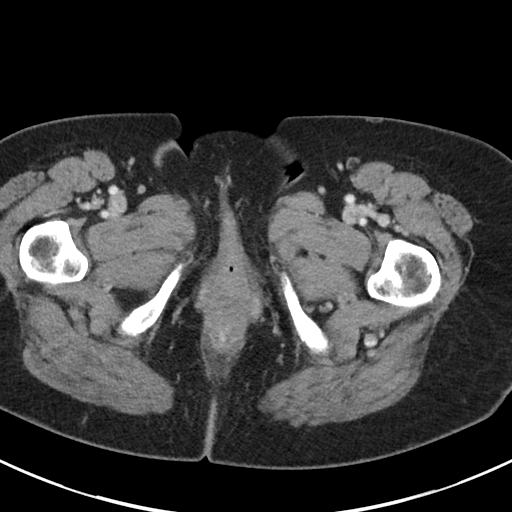
[im 4/79  bone]
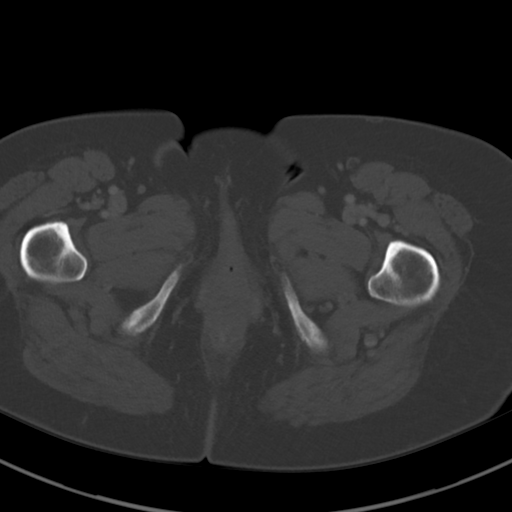
[im 12/79  soft-tissue]
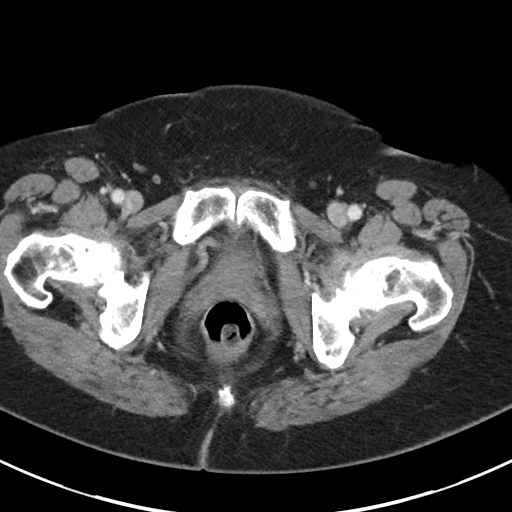
[im 16/79  soft-tissue]
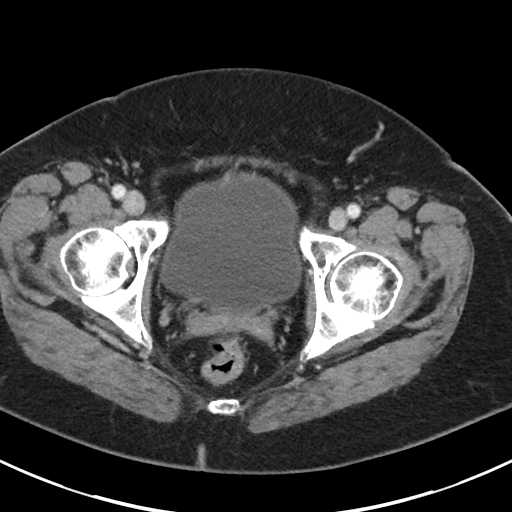
[im 20/79  soft-tissue]
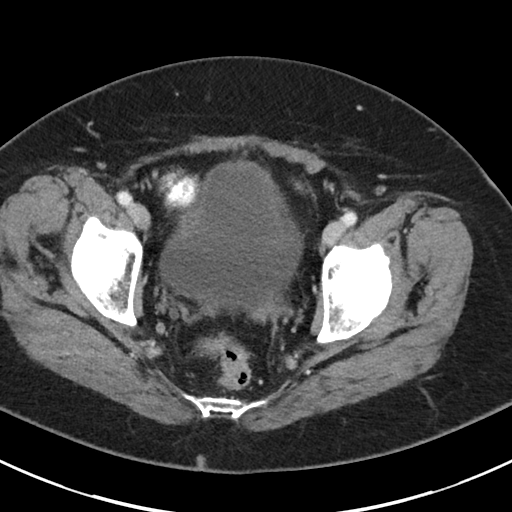
[im 28/79  soft-tissue]
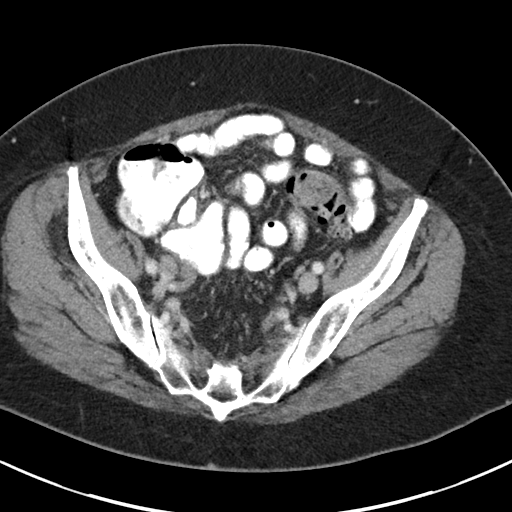
[im 32/79  soft-tissue]
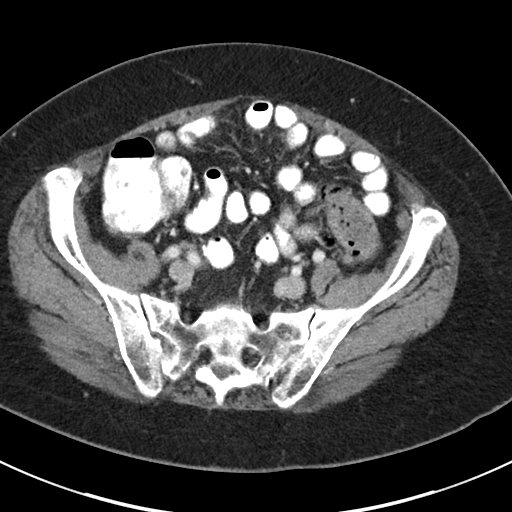
[im 36/79  soft-tissue]
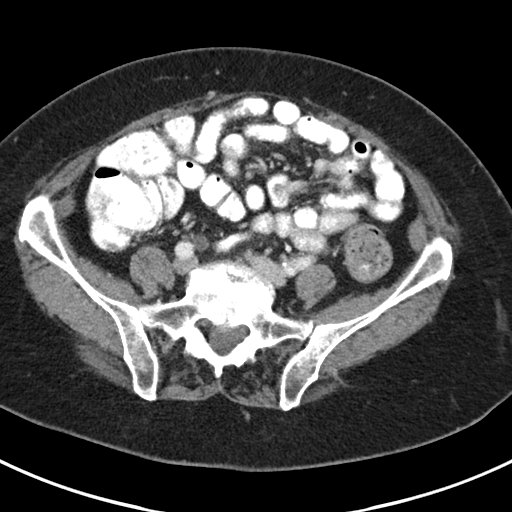
[im 43/79  soft-tissue]
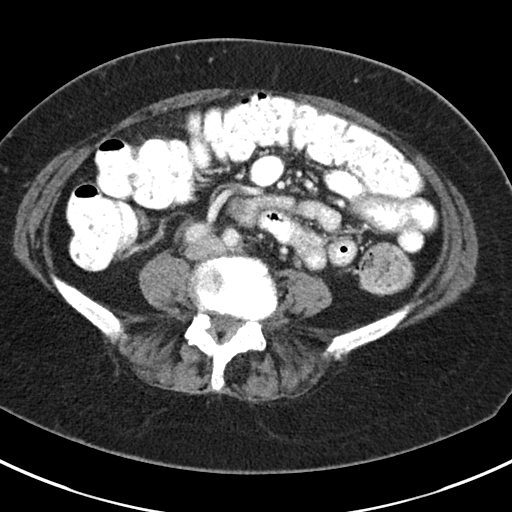
[im 47/79  soft-tissue]
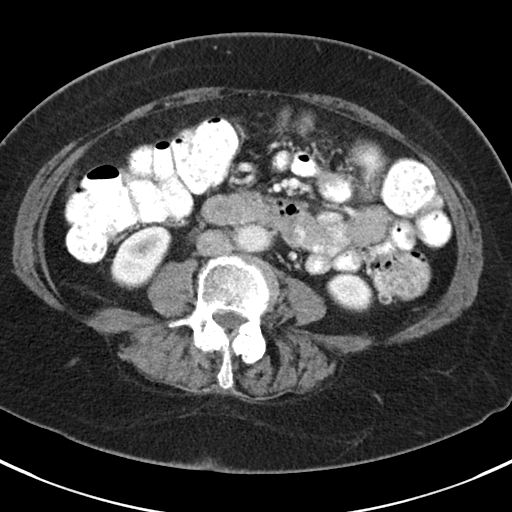
[im 47/79  bone]
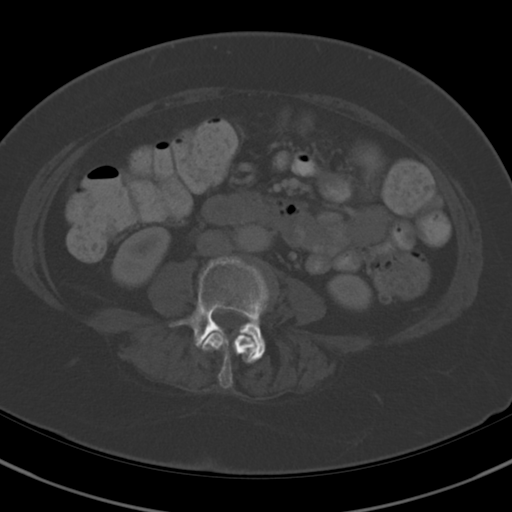
[im 51/79  soft-tissue]
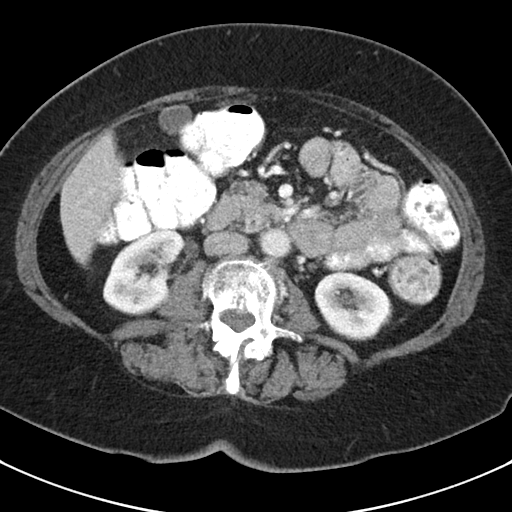
[im 59/79  soft-tissue]
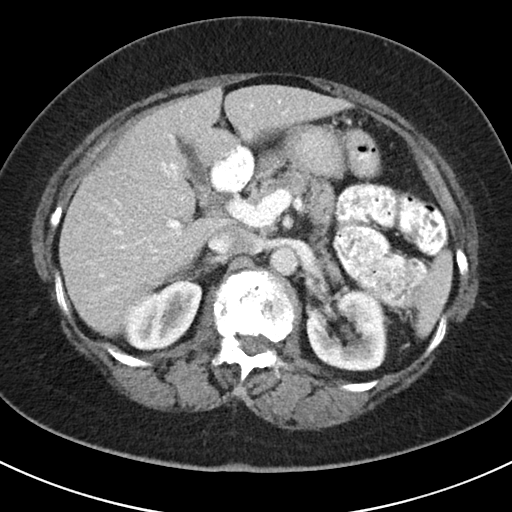
[im 63/79  soft-tissue]
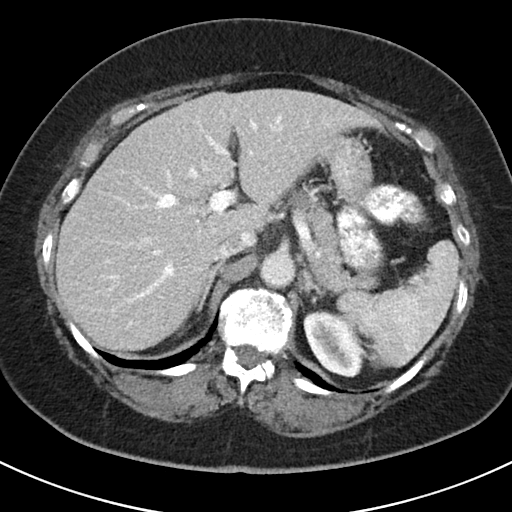
[im 67/79  soft-tissue]
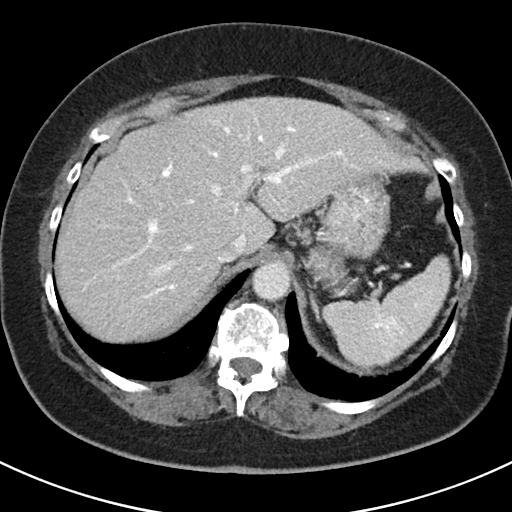
[im 75/79  soft-tissue]
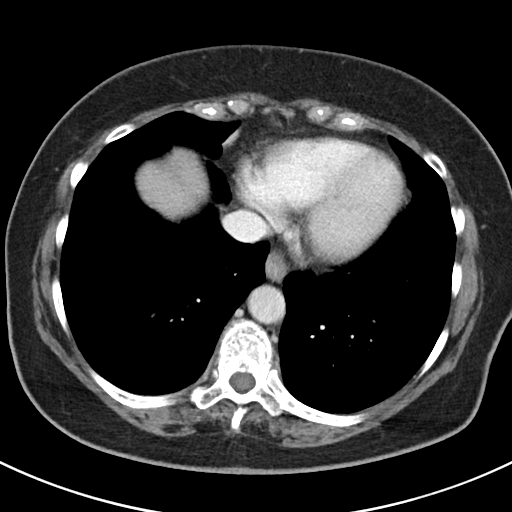

[Series 5: abd/pel w st · coronal · 0.66mm/px · 3 of 79 slices shown]
[im 27/79  soft-tissue]
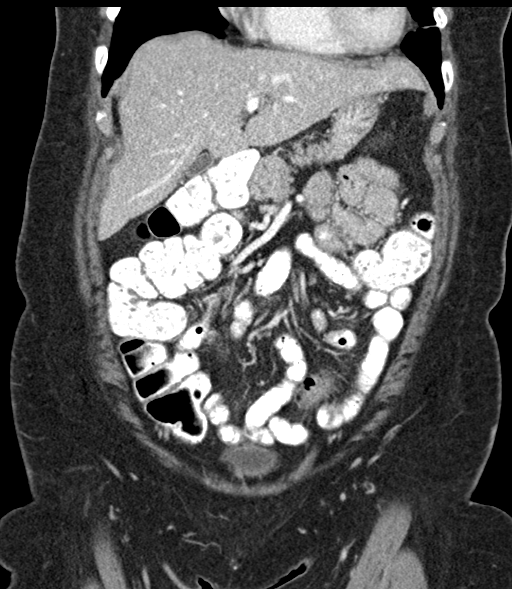
[im 35/79  soft-tissue]
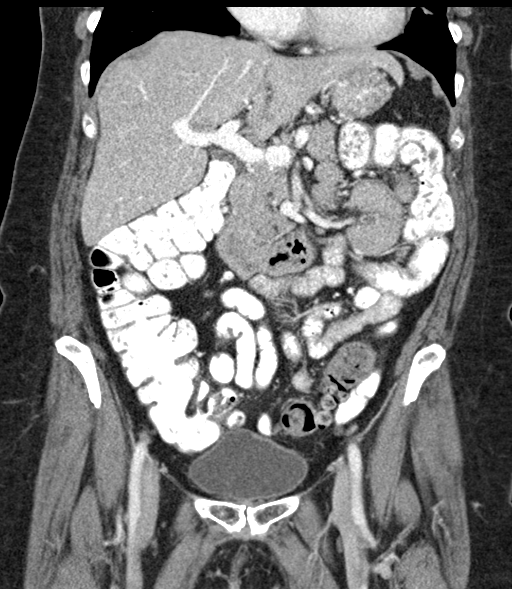
[im 44/79  soft-tissue]
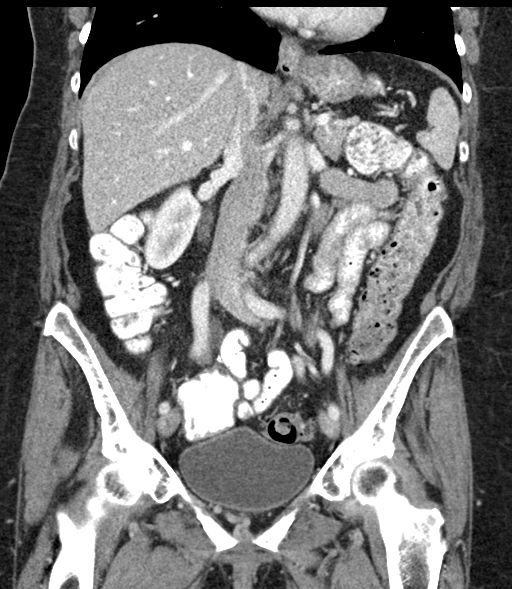

[17 of 46 positions shown; findings below may reference images not displayed]

FINDINGS: Lower Chest: No acute findings.

Hepatobiliary: No hepatic masses identified. Gallbladder is
unremarkable. No evidence of biliary ductal dilatation.

Pancreas:  No mass or inflammatory changes.

Spleen: Within normal limits in size and appearance.

Adrenals/Urinary Tract: No masses identified. No evidence of
ureteral calculi or hydronephrosis.

Stomach/Bowel: No evidence of obstruction, inflammatory process or
abnormal fluid collections. Diverticulosis is seen mainly involving
the sigmoid colon, however there is no evidence of diverticulitis.

Vascular/Lymphatic: No pathologically enlarged lymph nodes. No
abdominal aortic aneurysm.

Reproductive: Prior hysterectomy noted. Adnexal regions are
unremarkable in appearance.

Other:  None.

Musculoskeletal:  No suspicious bone lesions identified.
IMPRESSION: Colonic diverticulosis. No radiographic evidence of diverticulitis
or other acute findings.

## 2019-05-12 MED ORDER — IOHEXOL 300 MG/ML  SOLN
100.0000 mL | Freq: Once | INTRAMUSCULAR | Status: AC | PRN
  Administered 2019-05-12: 100 mL via INTRAVENOUS

## 2019-05-23 ENCOUNTER — Encounter: Payer: Self-pay | Admitting: Internal Medicine

## 2019-05-23 ENCOUNTER — Other Ambulatory Visit: Payer: Self-pay

## 2019-05-23 ENCOUNTER — Ambulatory Visit (INDEPENDENT_AMBULATORY_CARE_PROVIDER_SITE_OTHER): Payer: Medicare Other | Admitting: Internal Medicine

## 2019-05-23 DIAGNOSIS — E785 Hyperlipidemia, unspecified: Secondary | ICD-10-CM | POA: Diagnosis not present

## 2019-05-23 DIAGNOSIS — N390 Urinary tract infection, site not specified: Secondary | ICD-10-CM | POA: Insufficient documentation

## 2019-05-23 DIAGNOSIS — I10 Essential (primary) hypertension: Secondary | ICD-10-CM | POA: Diagnosis not present

## 2019-05-23 DIAGNOSIS — F329 Major depressive disorder, single episode, unspecified: Secondary | ICD-10-CM | POA: Diagnosis not present

## 2019-05-23 DIAGNOSIS — L853 Xerosis cutis: Secondary | ICD-10-CM

## 2019-05-23 DIAGNOSIS — R1011 Right upper quadrant pain: Secondary | ICD-10-CM | POA: Diagnosis not present

## 2019-05-23 DIAGNOSIS — F32A Depression, unspecified: Secondary | ICD-10-CM

## 2019-05-23 DIAGNOSIS — E034 Atrophy of thyroid (acquired): Secondary | ICD-10-CM

## 2019-05-23 MED ORDER — CEPHALEXIN 500 MG PO CAPS
ORAL_CAPSULE | ORAL | 1 refills | Status: DC
Start: 2019-05-23 — End: 2019-09-26

## 2019-05-23 NOTE — Assessment & Plan Note (Signed)
Better after treating UTI

## 2019-05-23 NOTE — Assessment & Plan Note (Signed)
Celexa

## 2019-05-23 NOTE — Assessment & Plan Note (Signed)
  2021 Coronary arteries: Two punctate areas of calcium noted one in proximal LAD and one in mid LAD  IMPRESSION: Coronary calcium score of 6. This was 34 th percentile for age and sex matched control.

## 2019-05-23 NOTE — Assessment & Plan Note (Addendum)
Recurrent cystitis Keflex prn for UTI prophylaxis - long bike rides etc Use bathroom more often

## 2019-05-23 NOTE — Assessment & Plan Note (Signed)
Red rice yeast extract 

## 2019-05-23 NOTE — Progress Notes (Signed)
Subjective:  Patient ID: Ellen [name], female    DOB: October 25, 1950  Age: 69 y.o. MRN: [id number]  CC: No chief complaint on file.   HPI Keiley Amariyah Lingen presents for RLQ abd - better after Cipro, UTI - recurrent. C/o dry skin  Outpatient Medications Prior to Visit  Medication Sig Dispense Refill  . benzonatate (TESSALON PERLES) 100 MG capsule Take 1-2 capsules (100-200 mg total) by mouth 3 (three) times daily as needed. 60 capsule 1  . Cholecalciferol (VITAMIN D3) 2000 units capsule Take 1 capsule (2,000 Units total) by mouth daily. 100 capsule 3  . ciprofloxacin (CIPRO) 250 MG tablet Take 1 tablet (250 mg total) by mouth 2 (two) times daily. 20 tablet 0  . citalopram (CELEXA) 20 MG tablet Take 1 tablet (20 mg total) by mouth daily. Annual appt is due must see provider for future refills 90 tablet 3  . CRANBERRY EXTRACT PO Take by mouth.    . Estradiol 10 MCG TABS vaginal tablet Place 1 tablet (10 mcg total) vaginally 2 (two) times a week. 8 tablet 9  . fluticasone (FLONASE) 50 MCG/ACT nasal spray Place 2 sprays into both nostrils daily. 11.1 g 2  . gabapentin (NEURONTIN) 300 MG capsule Take 1 capsule (300 mg total) by mouth 3 (three) times daily. 90 capsule 3  . levothyroxine (SYNTHROID) 125 MCG tablet Take 1 tablet (125 mcg total) by mouth daily. 90 tablet 3  . Multiple Vitamin (MULTIVITAMIN) tablet Take 1 tablet by mouth daily. Life's Abundance    . NON FORMULARY Allerfex  1 by mouth once daily as needed    . NON FORMULARY     . pantoprazole (PROTONIX) 20 MG tablet Take 1 tablet (20 mg total) by mouth daily. 90 tablet 3  . traZODone (DESYREL) 50 MG tablet TAKE 1/2 TO 1 TABLET BY MOUTH AT BEDTIME AS NEEDED FOR SLEEP 90 tablet 1  . Valerian Root 100 MG CAPS Take 1 capsule by mouth daily.      [name] zinc gluconate 50 MG tablet Take 50 mg by mouth daily.    [name] Zoster Vaccine Adjuvanted [facility]) injection      No facility-administered medications prior to visit.    ROS: Review of  Systems  Constitutional: Negative for activity change, appetite change, chills, fatigue and unexpected weight change.  HENT: Negative for congestion, mouth sores and sinus pressure.   Eyes: Negative for visual disturbance.  Respiratory: Negative for cough and chest tightness.   Gastrointestinal: Negative for abdominal pain and nausea.  Genitourinary: Negative for difficulty urinating, frequency and vaginal pain.  Musculoskeletal: Positive for gait problem. Negative for back pain.  Skin: Negative for pallor and rash.  Neurological: Negative for dizziness, tremors, weakness, numbness and headaches.  Psychiatric/Behavioral: Negative for confusion, sleep disturbance and suicidal ideas.    Objective:  BP 122/82 (BP Location: Left Arm, Patient Position: Sitting, Cuff Size: Normal)   Pulse 69   Temp 98.1 F (36.7 C) (Oral)   Ht 5\' 2"  (1.575 m)   Wt 154 lb (69.9 kg)   SpO2 96%   BMI 28.17 kg/m   BP Readings from Last 3 Encounters:  05/23/19 122/82  05/09/19 126/80  04/14/19 124/82    Wt Readings from Last 3 Encounters:  05/23/19 154 lb (69.9 kg)  05/09/19 157 lb (71.2 kg)  04/14/19 156 lb (70.8 kg)    Physical Exam Constitutional:      General: She is not in acute distress.    Appearance: She is well-developed.  HENT:     Head: Normocephalic.     Right Ear: External ear normal.     Left Ear: External ear normal.     Nose: Nose normal.  Eyes:     General:        Right eye: No discharge.        Left eye: No discharge.     Conjunctiva/sclera: Conjunctivae normal.     Pupils: Pupils are equal, round, and reactive to light.  Neck:     Thyroid: No thyromegaly.     Vascular: No JVD.     Trachea: No tracheal deviation.  Cardiovascular:     Rate and Rhythm: Normal rate and regular rhythm.     Heart sounds: Normal heart sounds.  Pulmonary:     Effort: No respiratory distress.     Breath sounds: No stridor. No wheezing.  Abdominal:     General: Bowel sounds are normal.  There is no distension.     Palpations: Abdomen is soft. There is no mass.     Tenderness: There is no abdominal tenderness. There is no guarding or rebound.  Musculoskeletal:        General: No tenderness.     Cervical back: Normal range of motion and neck supple.  Lymphadenopathy:     Cervical: No cervical adenopathy.  Skin:    Findings: No erythema or rash.  Neurological:     Mental Status: She is oriented to person, place, and time.     Cranial Nerves: No cranial nerve deficit.     Motor: No abnormal muscle tone.     Coordination: Coordination normal.     Deep Tendon Reflexes: Reflexes normal.  Psychiatric:        Behavior: Behavior normal.        Thought Content: Thought content normal.        Judgment: Judgment normal.     Lab Results  Component Value Date   WBC 5.8 05/09/2019   HGB 14.0 05/09/2019   HCT 41.7 05/09/2019   PLT 215.0 05/09/2019   GLUCOSE 91 05/09/2019   CHOL 233 (H) 02/03/2019   TRIG 166.0 (H) 02/03/2019   HDL 72.30 02/03/2019   LDLDIRECT 142.6 07/09/2011   LDLCALC 127 (H) 02/03/2019   ALT 14 05/09/2019   AST 20 05/09/2019   NA 139 05/09/2019   K 4.4 05/09/2019   CL 104 05/09/2019   CREATININE 0.80 05/09/2019   BUN 18 05/09/2019   CO2 28 05/09/2019   TSH 2.33 02/03/2019   HGBA1C 5.6 10/12/2006    CT Abdomen Pelvis W Contrast  Result Date: 05/12/2019 CLINICAL DATA:  Worsening right-sided abdominal pain, distention, and nausea and vomiting for several months. Urinary tract infection. EXAM: CT ABDOMEN AND PELVIS WITH CONTRAST TECHNIQUE: Multidetector CT imaging of the abdomen and pelvis was performed using the standard protocol following bolus administration of intravenous contrast. CONTRAST:  147mL OMNIPAQUE IOHEXOL 300 MG/ML  SOLN COMPARISON:  None. FINDINGS: Lower Chest: No acute findings. Hepatobiliary: No hepatic masses identified. Gallbladder is unremarkable. No evidence of biliary ductal dilatation. Pancreas:  No mass or inflammatory changes.  Spleen: Within normal limits in size and appearance. Adrenals/Urinary Tract: No masses identified. No evidence of ureteral calculi or hydronephrosis. Stomach/Bowel: No evidence of obstruction, inflammatory process or abnormal fluid collections. Diverticulosis is seen mainly involving the sigmoid colon, however there is no evidence of diverticulitis. Vascular/Lymphatic: No pathologically enlarged lymph nodes. No abdominal aortic aneurysm. Reproductive: Prior hysterectomy noted. Adnexal regions are unremarkable in appearance. Other:  None. Musculoskeletal:  No suspicious bone lesions identified. IMPRESSION: Colonic diverticulosis. No radiographic evidence of diverticulitis or other acute findings. Electronically Signed   By: Marlaine Hind M.D.   On: 05/12/2019 15:09    Assessment & Plan:   There are no diagnoses linked to this encounter.   No orders of the defined types were placed in this encounter.    Follow-up: No follow-ups on file.  Walker Kehr, MD

## 2019-05-23 NOTE — Patient Instructions (Signed)
  SebaMed soap    

## 2019-05-24 DIAGNOSIS — L853 Xerosis cutis: Secondary | ICD-10-CM | POA: Insufficient documentation

## 2019-05-24 LAB — URINALYSIS, ROUTINE W REFLEX MICROSCOPIC
Bilirubin Urine: NEGATIVE
Hgb urine dipstick: NEGATIVE
Ketones, ur: NEGATIVE
Nitrite: NEGATIVE
RBC / HPF: NONE SEEN (ref 0–?)
Specific Gravity, Urine: 1.015 (ref 1.000–1.030)
Total Protein, Urine: NEGATIVE
Urine Glucose: NEGATIVE
Urobilinogen, UA: 0.2 (ref 0.0–1.0)
pH: 7.5 (ref 5.0–8.0)

## 2019-05-24 NOTE — Assessment & Plan Note (Signed)
Levothroid 

## 2019-05-24 NOTE — Assessment & Plan Note (Signed)
  SebaMed soap    

## 2019-05-25 ENCOUNTER — Other Ambulatory Visit: Payer: Self-pay | Admitting: Internal Medicine

## 2019-05-25 DIAGNOSIS — R300 Dysuria: Secondary | ICD-10-CM

## 2019-06-02 ENCOUNTER — Other Ambulatory Visit (INDEPENDENT_AMBULATORY_CARE_PROVIDER_SITE_OTHER): Payer: Medicare Other

## 2019-06-02 DIAGNOSIS — R300 Dysuria: Secondary | ICD-10-CM | POA: Diagnosis not present

## 2019-06-02 LAB — URINALYSIS
Bilirubin Urine: NEGATIVE
Hgb urine dipstick: NEGATIVE
Ketones, ur: NEGATIVE
Leukocytes,Ua: NEGATIVE
Nitrite: NEGATIVE
Specific Gravity, Urine: 1.015 (ref 1.000–1.030)
Total Protein, Urine: NEGATIVE
Urine Glucose: NEGATIVE
Urobilinogen, UA: 0.2 (ref 0.0–1.0)
pH: 6 (ref 5.0–8.0)

## 2019-06-04 LAB — CULTURE, URINE COMPREHENSIVE: RESULT:: NO GROWTH

## 2019-06-09 ENCOUNTER — Other Ambulatory Visit: Payer: Self-pay

## 2019-06-09 ENCOUNTER — Ambulatory Visit: Payer: Medicare Other | Admitting: Family Medicine

## 2019-06-09 ENCOUNTER — Encounter: Payer: Self-pay | Admitting: Family Medicine

## 2019-06-09 DIAGNOSIS — S39013A Strain of muscle, fascia and tendon of pelvis, initial encounter: Secondary | ICD-10-CM

## 2019-06-09 DIAGNOSIS — M869 Osteomyelitis, unspecified: Secondary | ICD-10-CM | POA: Diagnosis not present

## 2019-06-09 DIAGNOSIS — S39011A Strain of muscle, fascia and tendon of abdomen, initial encounter: Secondary | ICD-10-CM

## 2019-06-09 MED ORDER — COLCHICINE 0.6 MG PO TABS
0.6000 mg | ORAL_TABLET | Freq: Every day | ORAL | 0 refills | Status: DC
Start: 2019-06-09 — End: 2019-11-21

## 2019-06-09 NOTE — Progress Notes (Signed)
[place] Phone: [phone] Subjective:   [name], am serving as a scribe for Dr. Hulan Saas. This visit occurred during the SARS-CoV-2 public health emergency.  Safety protocols were in place, including screening questions prior to the visit, additional usage of staff PPE, and extensive cleaning of exam room while observing appropriate contact time as indicated for disinfecting solutions.   I'm seeing this patient by the request  of:  Plotnikov, Evie Lacks, MD  CC: Groin pain follow-up  [id number]   04/14/2019 Patient has made significant strides in symptoms about 85 to 95% better at this time.  Increase activity as tolerated.  Continue the thigh compression on a regular basis for another month.  Otherwise follow-up with me as needed  Update 06/09/2019 Arlis Michelee Ruffing is a 69 y.o. female coming in with complaint of right groin strain. Is making slow progress. Using thigh compression sleeve on right side. Is now having pain in left groin.  Patient did have a CT scan of her abdomen pelvis was done about 3 weeks ago.  This was independently visualized by me.  Looking at patient's pelvic bone there is some signs of osteitis pubis but seems to be resolving.  No true cortical defect of concern on the stress reaction.    Past Medical History:  Diagnosis Date  . ALLERGIC RHINITIS   . ANXIETY   . GERD   . INSOMNIA-SLEEP DISORDER-UNSPEC   . Osteopenia 01/2016   T score -1.2 FRAX 15%/0.9%  . Shingles   . Thyroid disease    Past Surgical History:  Procedure Laterality Date  . ANKLE FRACTURE SURGERY  2012   R  . APPENDECTOMY    . BREAST SURGERY     REDUCTION MAMMOPLASTY  . CESAREAN SECTION     twice  . COSMETIC SURGERY    . FACELIFT     s/p  . TONSILLECTOMY  1974  . Upper nl TSH w/sx    . VAGINAL HYSTERECTOMY  1991   TVH, irregular bleeding   Social History   Socioeconomic History  . Marital  status: Married    Spouse name: Not on file  . Number of children: 2  . Years of education: Not on file  . Highest education level: Not on file  Occupational History  . Occupation: Retired    Fish farm manager: SELF EMPLOYED  Tobacco Use  . Smoking status: Never Smoker  . Smokeless tobacco: Never Used  Substance and Sexual Activity  . Alcohol use: Yes    Alcohol/week: 10.0 standard drinks    Types: 10 Glasses of wine per week  . Drug use: No  . Sexual activity: Yes    Birth control/protection: Post-menopausal, Surgical    Comment: HYST-1st intercourse 69 yo-Fewer than 5 partners  Other Topics Concern  . Not on file  Social History Narrative   Regular exercise   2 biol children-1 disabled with schizophrenia & 1 stepchild   Social Determinants of Health   Financial Resource Strain:   . Difficulty of Paying Living Expenses:   Food Insecurity:   . Worried About Charity fundraiser in the Last Year:   . Arboriculturist in the Last Year:   Transportation Needs:   . Film/video editor (Medical):   [name] Lack of Transportation (Non-Medical):   Physical Activity:   . Days of Exercise per Week:   . Minutes of Exercise per Session:   Stress:   .  Feeling of Stress :   Social Connections:   . Frequency of Communication with Friends and Family:   . Frequency of Social Gatherings with Friends and Family:   . Attends Religious Services:   . Active Member of Clubs or Organizations:   . Attends Archivist Meetings:   [name] Marital Status:    Allergies  Allergen Reactions  . Belviq [Lorcaserin Hcl]     palpitations  . Demerol [Meperidine]     palpitations  . Elavil [Amitriptyline Hcl]     constipation  . Latex Hives, Itching and Rash    Allergy  To latex, hives , redness, itching ,rash  . Medical Adhesive Remover Hives, Itching and Rash   Family History  Problem Relation Age of Onset  . Heart disease Father        CAD/CABG in his 8's  . Hypertension Mother   . Heart  failure Mother     Current Outpatient Medications (Endocrine & Metabolic):  .  levothyroxine (SYNTHROID) 125 MCG tablet, Take 1 tablet (125 mcg total) by mouth daily.   Current Outpatient Medications (Respiratory):  .  benzonatate (TESSALON PERLES) 100 MG capsule, Take 1-2 capsules (100-200 mg total) by mouth 3 (three) times daily as needed. .  fluticasone (FLONASE) 50 MCG/ACT nasal spray, Place 2 sprays into both nostrils daily.  Current Outpatient Medications (Analgesics):  .  colchicine 0.6 MG tablet, Take 1 tablet (0.6 mg total) by mouth daily.   Current Outpatient Medications (Other):  .  cephALEXin (KEFLEX) 500 MG capsule, 1 po qd prn for UTI prophylaxis .  Cholecalciferol (VITAMIN D3) 2000 units capsule, Take 1 capsule (2,000 Units total) by mouth daily. .  citalopram (CELEXA) 20 MG tablet, Take 1 tablet (20 mg total) by mouth daily. Annual appt is due must see provider for future refills .  CRANBERRY EXTRACT PO, Take by mouth. .  Estradiol 10 MCG TABS vaginal tablet, Place 1 tablet (10 mcg total) vaginally 2 (two) times a week. .  gabapentin (NEURONTIN) 300 MG capsule, Take 1 capsule (300 mg total) by mouth 3 (three) times daily. .  Multiple Vitamin (MULTIVITAMIN) tablet, Take 1 tablet by mouth daily. Life's Abundance .  NON FORMULARY, Allerfex  1 by mouth once daily as needed .  NON FORMULARY,  .  pantoprazole (PROTONIX) 20 MG tablet, Take 1 tablet (20 mg total) by mouth daily. .  traZODone (DESYREL) 50 MG tablet, TAKE 1/2 TO 1 TABLET BY MOUTH AT BEDTIME AS NEEDED FOR SLEEP .  Valerian Root 100 MG CAPS, Take 1 capsule by mouth daily.   [name]  zinc gluconate 50 MG tablet, Take 50 mg by mouth daily. [name]  Zoster Vaccine Adjuvanted [facility]) injection,    Reviewed prior external information including notes and imaging from  primary care provider As well as notes that were available from care everywhere and other healthcare systems.  Past medical history, social, surgical and  family history all reviewed in electronic medical record.  No pertanent information unless stated regarding to the chief complaint.   Review of Systems:  No headache, visual changes, nausea, vomiting, diarrhea, constipation, dizziness, abdominal pain, skin rash, fevers, chills, night sweats, weight loss, swollen lymph nodes, body aches, joint swelling, chest pain, shortness of breath, mood changes. POSITIVE muscle aches  Objective  Blood pressure 128/82, pulse 68, height 5\' 2"  (1.575 m), weight 154 lb (69.9 kg), SpO2 98 %.   General: No apparent distress alert and oriented x3 mood and affect normal, dressed appropriately.  HEENT: Pupils equal, extraocular movements intact  Respiratory: Patient's speak in full sentences and does not appear short of breath  Cardiovascular: No lower extremity edema, non tender, no erythema  Neuro: Cranial nerves II through XII are intact, neurovascularly intact in all extremities with 2+ DTRs and 2+ pulses.  Gait normal with good balance and coordination.  MSK: Right hip exam still has severe tenderness in the right near the pelvic bone.  Very mild pain with internal rotation.  Tender to palpation over the pubic symphysis.  Patient has 5-5 strength is noted.    Impression and Recommendations:     The above documentation has been reviewed and is accurate and complete Lyndal Pulley, DO       Note: This dictation was prepared with Dragon dictation along with smaller phrase technology. Any transcriptional errors that result from this process are unintentional.

## 2019-06-09 NOTE — Assessment & Plan Note (Signed)
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  Discussed with patient in great length about this.  CT abdomen pelvis did not show any other significant abnormality.  Colchicine for short course I think will be helpful.  Discussed icing regimen, increase activity slowly but continue the compression sleeve.  Follow-up again in 2 months

## 2019-06-09 NOTE — Assessment & Plan Note (Signed)
5-day burst of colchicine given.

## 2019-06-09 NOTE — Patient Instructions (Signed)
Keep doing everything you are doing See me again in 2 months

## 2019-08-03 ENCOUNTER — Other Ambulatory Visit: Payer: Self-pay | Admitting: Internal Medicine

## 2019-08-03 ENCOUNTER — Other Ambulatory Visit: Payer: Medicare Other

## 2019-08-03 DIAGNOSIS — R300 Dysuria: Secondary | ICD-10-CM | POA: Diagnosis not present

## 2019-08-04 ENCOUNTER — Ambulatory Visit: Payer: Medicare Other | Admitting: Internal Medicine

## 2019-08-04 LAB — URINALYSIS
Bilirubin Urine: NEGATIVE
Glucose, UA: NEGATIVE
Hgb urine dipstick: NEGATIVE
Nitrite: NEGATIVE
Protein, ur: NEGATIVE
Specific Gravity, Urine: 1.01 (ref 1.001–1.03)
pH: 6 (ref 5.0–8.0)

## 2019-08-09 ENCOUNTER — Ambulatory Visit: Payer: Medicare Other | Admitting: Family Medicine

## 2019-08-10 ENCOUNTER — Encounter: Payer: Self-pay | Admitting: Family Medicine

## 2019-08-17 ENCOUNTER — Ambulatory Visit: Payer: Self-pay

## 2019-08-17 ENCOUNTER — Ambulatory Visit: Payer: Medicare Other | Admitting: Family Medicine

## 2019-08-17 ENCOUNTER — Other Ambulatory Visit: Payer: Self-pay

## 2019-08-17 ENCOUNTER — Ambulatory Visit (INDEPENDENT_AMBULATORY_CARE_PROVIDER_SITE_OTHER): Payer: Medicare Other

## 2019-08-17 VITALS — BP 110/72 | HR 78 | Ht 62.0 in | Wt 154.0 lb

## 2019-08-17 DIAGNOSIS — M1711 Unilateral primary osteoarthritis, right knee: Secondary | ICD-10-CM | POA: Diagnosis not present

## 2019-08-17 DIAGNOSIS — M25561 Pain in right knee: Secondary | ICD-10-CM | POA: Diagnosis not present

## 2019-08-17 DIAGNOSIS — M869 Osteomyelitis, unspecified: Secondary | ICD-10-CM

## 2019-08-17 DIAGNOSIS — S83241A Other tear of medial meniscus, current injury, right knee, initial encounter: Secondary | ICD-10-CM

## 2019-08-17 DIAGNOSIS — S39011A Strain of muscle, fascia and tendon of abdomen, initial encounter: Secondary | ICD-10-CM

## 2019-08-17 DIAGNOSIS — S39013A Strain of muscle, fascia and tendon of pelvis, initial encounter: Secondary | ICD-10-CM

## 2019-08-17 IMAGING — DX DG KNEE 3 VIEWS*R*
1 series · 1 of 1 positions shown · non-contrast
Comparison: None.

CLINICAL DATA: Pain

EXAM:
RIGHT KNEE - 3 VIEW

[patella]
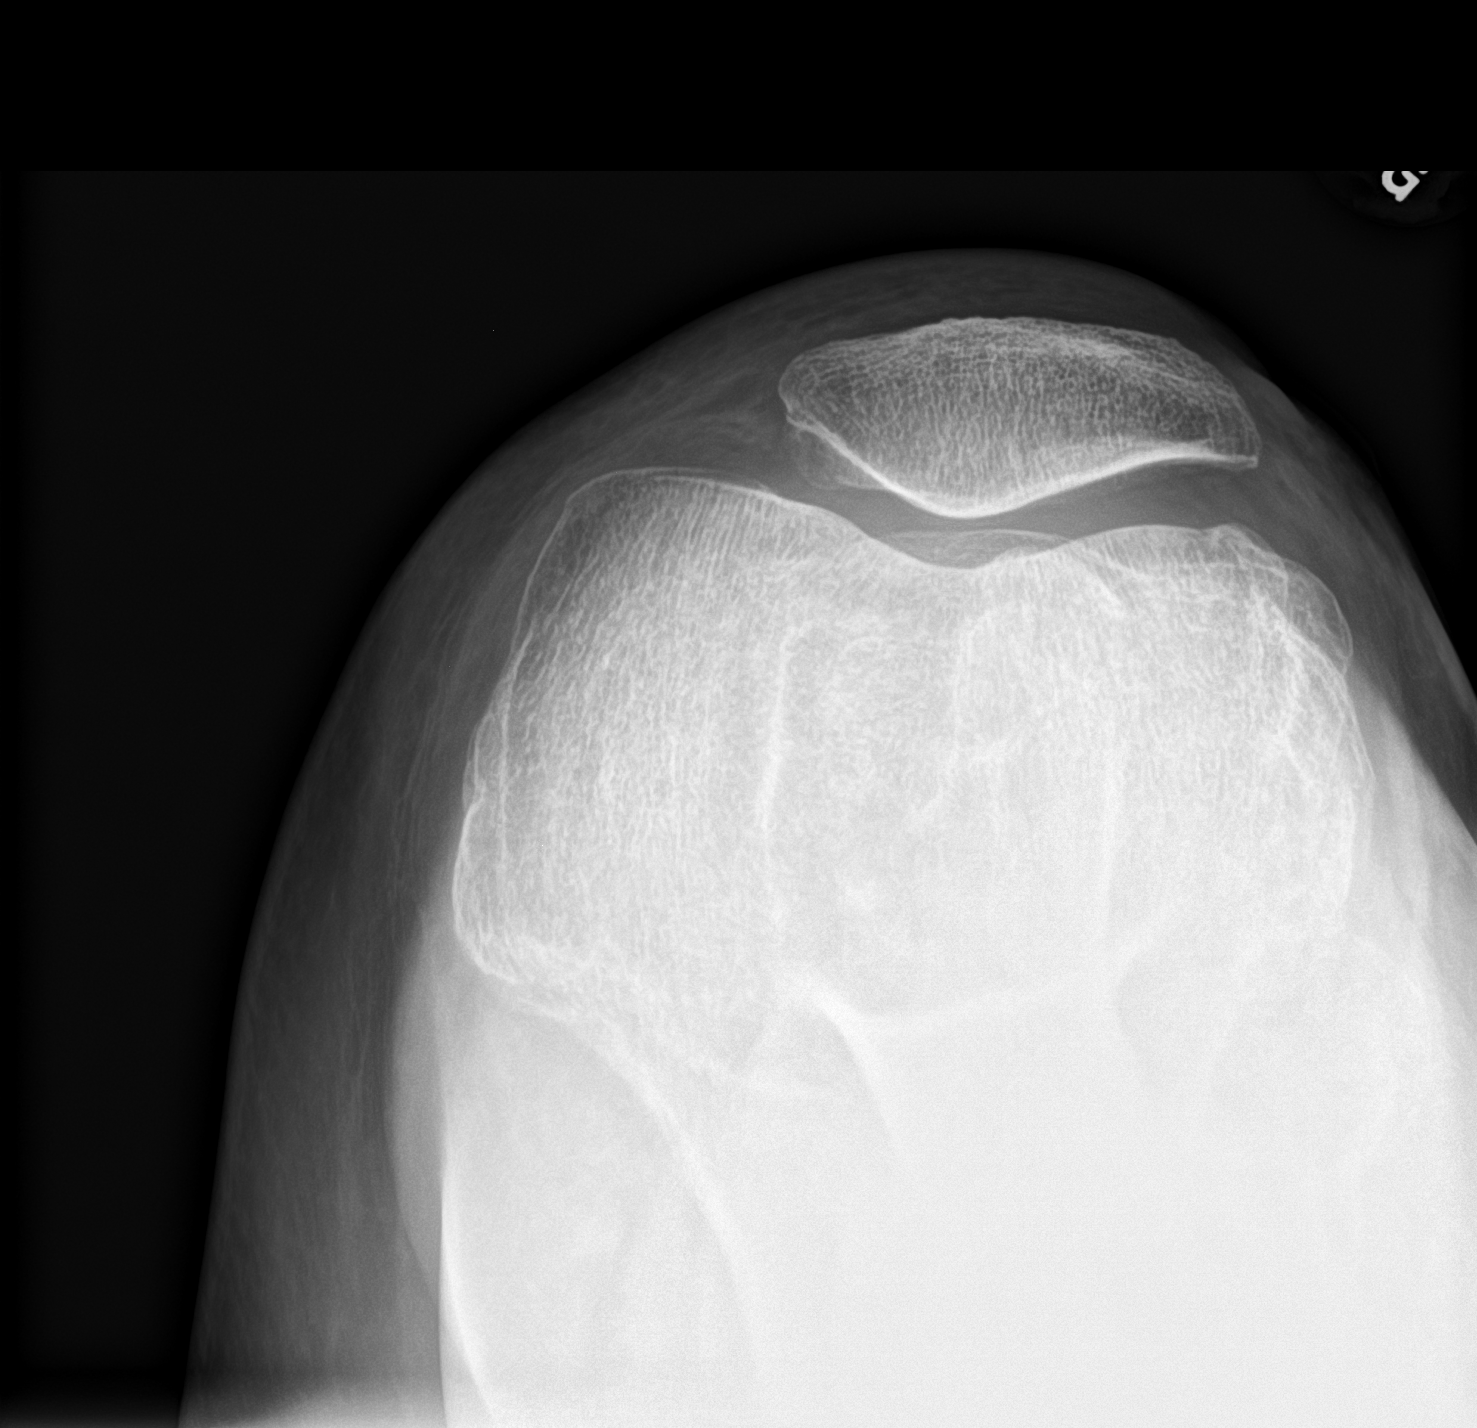

[1 of 1 positions shown; findings below may reference images not displayed]

FINDINGS: Frontal, lateral, and sunrise patellar images were obtained. There
is no fracture or dislocation. No joint effusion. There is mild
narrowing laterally. There is mild spurring laterally. No erosive
change.
IMPRESSION: Mild joint space narrowing and spurring laterally. No fracture,
dislocation, or joint effusion.

## 2019-08-17 NOTE — Patient Instructions (Addendum)
Scapular exercises Injected knee today Raise handlebars Postural brace only on the bike Xray today Pennsaid 2x a day finger tip sized amount See me in 6 weeks

## 2019-08-17 NOTE — Progress Notes (Signed)
[address] [place] Phone: [phone] Subjective:   [name], am serving as a scribe for Dr. Hulan Saas. This visit occurred during the SARS-CoV-2 public health emergency.  Safety protocols were in place, including screening questions prior to the visit, additional usage of staff PPE, and extensive cleaning of exam room while observing appropriate contact time as indicated for disinfecting solutions.    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  Discussed with patient in great length about this.  CT abdomen pelvis did not show any other significant abnormality.  Colchicine for short course I think will be helpful.  Discussed icing regimen, increase activity slowly but continue the compression sleeve.  Follow-up again in 2 months  Update 08/17/2019 Ellen [name] is a 69 y.o. female coming in with complaint of right groin pain and right knee pain. Patient states was riding on the [employer] and felt anterior knee pain. Had injection years ago that helped to alleviate her pain  Groin pain has improved. Patient notes that she is very careful getting on and off the bike. Does have some soreness on right side.   Also having an increase in thoracic spine pain. Believes that it is from cycling.      Past Medical History:  Diagnosis Date  . ALLERGIC RHINITIS   . ANXIETY   . GERD   . INSOMNIA-SLEEP DISORDER-UNSPEC   . Osteopenia 01/2016   T score -1.2 FRAX 15%/0.9%  . Shingles   . Thyroid disease    Past Surgical History:  Procedure Laterality Date  . ANKLE FRACTURE SURGERY  2012   R  . APPENDECTOMY    . BREAST SURGERY     REDUCTION MAMMOPLASTY  . CESAREAN SECTION     twice  . COSMETIC SURGERY    . FACELIFT      s/p  . TONSILLECTOMY  1974  . Upper nl TSH w/sx    . VAGINAL HYSTERECTOMY  1991   TVH, irregular bleeding   Social History   Socioeconomic History  . Marital status: Married    Spouse name: Not on file  . Number of children: 2  . Years of education: Not on file  . Highest education level: Not on file  Occupational History  . Occupation: Retired    Fish farm manager: SELF EMPLOYED  Tobacco Use  . Smoking status: Never Smoker  . Smokeless tobacco: Never Used  Vaping Use  . Vaping Use: Never used  Substance and Sexual Activity  . Alcohol use: Yes    Alcohol/week: 10.0 standard drinks    Types: 10 Glasses of wine per week  . Drug use: No  . Sexual activity: Yes    Birth control/protection: Post-menopausal, Surgical    Comment: HYST-1st intercourse 69 yo-Fewer than 5 partners  Other Topics Concern  . Not on file  Social History Narrative   Regular exercise   2 biol children-1 disabled with schizophrenia & 1 stepchild   Social Determinants of Health   Financial Resource Strain:   . Difficulty of Paying Living Expenses:   Food Insecurity:   . Worried About Charity fundraiser in the Last Year:   . Arboriculturist in the Last Year:   Transportation Needs:   .  Lack of Transportation (Medical):   [name] Lack of Transportation (Non-Medical):   Physical Activity:   . Days of Exercise per Week:   . Minutes of Exercise per Session:   Stress:   . Feeling of Stress :   Social Connections:   . Frequency of Communication with Friends and Family:   . Frequency of Social Gatherings with Friends and Family:   . Attends Religious Services:   . Active Member of Clubs or Organizations:   . Attends Archivist Meetings:   [name] Marital Status:    Allergies  Allergen Reactions  . Belviq [Lorcaserin Hcl]     palpitations  . Demerol [Meperidine]     palpitations  . Elavil [Amitriptyline Hcl]     constipation  . Latex Hives, Itching and Rash    Allergy  To latex, hives , redness,  itching ,rash  . Medical Adhesive Remover Hives, Itching and Rash   Family History  Problem Relation Age of Onset  . Heart disease Father        CAD/CABG in his 74's  . Hypertension Mother   . Heart failure Mother     Current Outpatient Medications (Endocrine & Metabolic):  .  levothyroxine (SYNTHROID) 125 MCG tablet, Take 1 tablet (125 mcg total) by mouth daily.   Current Outpatient Medications (Respiratory):  .  benzonatate (TESSALON PERLES) 100 MG capsule, Take 1-2 capsules (100-200 mg total) by mouth 3 (three) times daily as needed. .  fluticasone (FLONASE) 50 MCG/ACT nasal spray, Place 2 sprays into both nostrils daily.  Current Outpatient Medications (Analgesics):  .  colchicine 0.6 MG tablet, Take 1 tablet (0.6 mg total) by mouth daily.   Current Outpatient Medications (Other):  .  cephALEXin (KEFLEX) 500 MG capsule, 1 po qd prn for UTI prophylaxis .  Cholecalciferol (VITAMIN D3) 2000 units capsule, Take 1 capsule (2,000 Units total) by mouth daily. .  citalopram (CELEXA) 20 MG tablet, Take 1 tablet (20 mg total) by mouth daily. Annual appt is due must see provider for future refills .  CRANBERRY EXTRACT PO, Take by mouth. .  Estradiol 10 MCG TABS vaginal tablet, Place 1 tablet (10 mcg total) vaginally 2 (two) times a week. .  gabapentin (NEURONTIN) 300 MG capsule, Take 1 capsule (300 mg total) by mouth 3 (three) times daily. .  Multiple Vitamin (MULTIVITAMIN) tablet, Take 1 tablet by mouth daily. Life's Abundance .  NON FORMULARY, Allerfex  1 by mouth once daily as needed .  NON FORMULARY,  .  pantoprazole (PROTONIX) 20 MG tablet, Take 1 tablet (20 mg total) by mouth daily. .  traZODone (DESYREL) 50 MG tablet, TAKE 1/2 TO 1 TABLET BY MOUTH AT BEDTIME AS NEEDED FOR SLEEP .  Valerian Root 100 MG CAPS, Take 1 capsule by mouth daily.   [name]  zinc gluconate 50 MG tablet, Take 50 mg by mouth daily. [name]  Zoster Vaccine Adjuvanted [facility]) injection,    Reviewed prior external  information including notes and imaging from  primary care provider As well as notes that were available from care everywhere and other healthcare systems.  Past medical history, social, surgical and family history all reviewed in electronic medical record.  No pertanent information unless stated regarding to the chief complaint.   Review of Systems:  No headache, visual changes, nausea, vomiting, diarrhea, constipation, dizziness, abdominal pain, skin rash, fevers, chills, night sweats, weight loss, swollen lymph nodes, body aches, joint swelling, chest pain, shortness of breath, mood changes. POSITIVE muscle aches  Objective  Blood pressure 110/72, pulse 78, height 5\' 2"  (1.575 m), weight 154 lb (69.9 kg), SpO2 99 %.   General: No apparent distress alert and oriented x3 mood and affect normal, dressed appropriately.  HEENT: Pupils equal, extraocular movements intact  Respiratory: Patient's speak in full sentences and does not appear short of breath  Cardiovascular: No lower extremity edema, non tender, no erythema  Neuro: Cranial nerves II through XII are intact, neurovascularly intact in all extremities with 2+ DTRs and 2+ pulses.  Gait normal with good balance and coordination.  MSK:   Upper back does have some very mild scapular dyskinesis noted.  Some mild tightness in the thoracolumbar junction.  No spinous process or tenderness noted.  Right knee exam shows that patient does have tenderness to palpation anteriorly and diffusely on the knee.  Positive McMurray's noted.  Mild valgus and varus instability noted.  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 Right knee was prepped with alcohol swab and utilizing anterolateral approach, patient's right knee space was injected with 4:1  marcaine 0.5%: Kenalog 40mg /dL. Patient tolerated the procedure well without immediate complications.    Impression and Recommendations:     The above documentation has been reviewed and is accurate and complete Lyndal Pulley, DO       Note: This dictation was prepared with Dragon dictation along with smaller phrase technology. Any transcriptional errors that result from this process are unintentional.

## 2019-08-18 ENCOUNTER — Encounter: Payer: Self-pay | Admitting: Family Medicine

## 2019-08-18 DIAGNOSIS — S83241A Other tear of medial meniscus, current injury, right knee, initial encounter: Secondary | ICD-10-CM | POA: Insufficient documentation

## 2019-08-18 NOTE — Assessment & Plan Note (Signed)
Significant improvement at this time.  Discussed continuing to monitor.  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  Patient will continue to stay active

## 2019-08-18 NOTE — Assessment & Plan Note (Signed)
Patient has what appears to be an acute on chronic meniscal tear with some underlying arthritis.  Would like to try injection today.  Discussed potential bracing, home exercises, icing regimen.  Patient has had trouble with this previously and has done significantly better for years.  X-rays ordered to further evaluate the amount of arthritis.  Follow-up again in 6 weeks

## 2019-09-01 ENCOUNTER — Other Ambulatory Visit: Payer: Self-pay | Admitting: Internal Medicine

## 2019-09-18 ENCOUNTER — Encounter: Payer: Self-pay | Admitting: Family Medicine

## 2019-09-19 ENCOUNTER — Other Ambulatory Visit: Payer: Self-pay

## 2019-09-19 DIAGNOSIS — M25559 Pain in unspecified hip: Secondary | ICD-10-CM

## 2019-09-19 DIAGNOSIS — R1030 Lower abdominal pain, unspecified: Secondary | ICD-10-CM

## 2019-09-26 ENCOUNTER — Ambulatory Visit (INDEPENDENT_AMBULATORY_CARE_PROVIDER_SITE_OTHER): Payer: Medicare Other | Admitting: Internal Medicine

## 2019-09-26 ENCOUNTER — Other Ambulatory Visit: Payer: Self-pay

## 2019-09-26 ENCOUNTER — Encounter: Payer: Self-pay | Admitting: Internal Medicine

## 2019-09-26 VITALS — BP 126/80 | HR 71 | Temp 98.3°F | Ht 62.0 in | Wt 146.0 lb

## 2019-09-26 DIAGNOSIS — F329 Major depressive disorder, single episode, unspecified: Secondary | ICD-10-CM | POA: Diagnosis not present

## 2019-09-26 DIAGNOSIS — G4700 Insomnia, unspecified: Secondary | ICD-10-CM

## 2019-09-26 DIAGNOSIS — J069 Acute upper respiratory infection, unspecified: Secondary | ICD-10-CM | POA: Diagnosis not present

## 2019-09-26 DIAGNOSIS — F411 Generalized anxiety disorder: Secondary | ICD-10-CM | POA: Diagnosis not present

## 2019-09-26 DIAGNOSIS — F32A Depression, unspecified: Secondary | ICD-10-CM

## 2019-09-26 DIAGNOSIS — E034 Atrophy of thyroid (acquired): Secondary | ICD-10-CM

## 2019-09-26 DIAGNOSIS — E785 Hyperlipidemia, unspecified: Secondary | ICD-10-CM | POA: Diagnosis not present

## 2019-09-26 DIAGNOSIS — I10 Essential (primary) hypertension: Secondary | ICD-10-CM

## 2019-09-26 DIAGNOSIS — Z23 Encounter for immunization: Secondary | ICD-10-CM

## 2019-09-26 DIAGNOSIS — R931 Abnormal findings on diagnostic imaging of heart and coronary circulation: Secondary | ICD-10-CM | POA: Insufficient documentation

## 2019-09-26 MED ORDER — METHOCARBAMOL 500 MG PO TABS: 500.0000 mg | ORAL_TABLET | Freq: Every day | ORAL | 5 refills | Status: DC | PRN

## 2019-09-26 MED ORDER — CEPHALEXIN 500 MG PO CAPS: ORAL_CAPSULE | ORAL | 1 refills | Status: DC

## 2019-09-26 MED ORDER — BENZONATATE 100 MG PO CAPS: 100.0000 mg | ORAL_CAPSULE | Freq: Three times a day (TID) | ORAL | 0 refills | Status: DC | PRN

## 2019-09-26 NOTE — Addendum Note (Signed)
Addended by: Cresenciano Lick on: 09/26/2019 03:31 PM   Modules accepted: Orders

## 2019-09-26 NOTE — Progress Notes (Signed)
Subjective:  Patient ID: Criss Alvine, female    DOB: 12-Mar-1950  Age: 69 y.o. MRN: [id number]  CC: No chief complaint on file.   HPI Kayln Kamree Wiens presents for R groin pain - in PT Pt lost 12 lbs on diet F/u UTIs, hypothyroidism, TMJ - was given Robaxin    Outpatient Medications Prior to Visit  Medication Sig Dispense Refill  . benzonatate (TESSALON PERLES) 100 MG capsule Take 1-2 capsules (100-200 mg total) by mouth 3 (three) times daily as needed. 60 capsule 1  . cephALEXin (KEFLEX) 500 MG capsule 1 po qd prn for UTI prophylaxis 20 capsule 1  . Cholecalciferol (VITAMIN D3) 2000 units capsule Take 1 capsule (2,000 Units total) by mouth daily. 100 capsule 3  . citalopram (CELEXA) 20 MG tablet Take 1 tablet (20 mg total) by mouth daily. Annual appt is due must see provider for future refills 90 tablet 3  . CRANBERRY EXTRACT PO Take by mouth.    . Estradiol 10 MCG TABS vaginal tablet Place 1 tablet (10 mcg total) vaginally 2 (two) times a week. 8 tablet 9  . fluticasone (FLONASE) 50 MCG/ACT nasal spray Place 2 sprays into both nostrils daily. 11.1 g 2  . gabapentin (NEURONTIN) 300 MG capsule Take 1 capsule (300 mg total) by mouth 3 (three) times daily. 90 capsule 3  . levothyroxine (SYNTHROID) 125 MCG tablet Take 1 tablet (125 mcg total) by mouth daily. 90 tablet 3  . Multiple Vitamin (MULTIVITAMIN) tablet Take 1 tablet by mouth daily. Life's Abundance    . NON FORMULARY Allerfex  1 by mouth once daily as needed    . NON FORMULARY     . pantoprazole (PROTONIX) 20 MG tablet Take 1 tablet (20 mg total) by mouth daily. 90 tablet 3  . traZODone (DESYREL) 50 MG tablet TAKE 1/2 TO 1 (ONE-HALF TO ONE) TABLET BY MOUTH AT BEDTIME AS NEEDED FOR SLEEP 90 tablet 1  . Valerian Root 100 MG CAPS Take 1 capsule by mouth daily.      [name] zinc gluconate 50 MG tablet Take 50 mg by mouth daily.    [name] Zoster Vaccine Adjuvanted [facility]) injection     . colchicine 0.6 MG tablet Take 1 tablet (0.6  mg total) by mouth daily. 30 tablet 0   No facility-administered medications prior to visit.    ROS: Review of Systems  Constitutional: Negative for activity change, appetite change, chills, fatigue and unexpected weight change.  HENT: Negative for congestion, mouth sores and sinus pressure.   Eyes: Negative for visual disturbance.  Respiratory: Negative for cough and chest tightness.   Gastrointestinal: Negative for abdominal pain and nausea.  Genitourinary: Negative for difficulty urinating, frequency and vaginal pain.  Musculoskeletal: Positive for arthralgias. Negative for back pain and gait problem.  Skin: Negative for pallor and rash.  Neurological: Negative for dizziness, tremors, weakness, numbness and headaches.  Psychiatric/Behavioral: Negative for confusion and sleep disturbance.    Objective:  BP 126/80 (BP Location: Left Arm, Patient Position: Sitting, Cuff Size: Normal)   Pulse 71   Temp 98.3 F (36.8 C) (Oral)   Ht 5\' 2"  (1.575 m)   Wt 146 lb (66.2 kg)   SpO2 98%   BMI 26.70 kg/m   BP Readings from Last 3 Encounters:  09/26/19 126/80  08/17/19 110/72  06/09/19 128/82    Wt Readings from Last 3 Encounters:  09/26/19 146 lb (66.2 kg)  08/17/19 154 lb (69.9 kg)  06/09/19 154 lb (69.9  kg)    Physical Exam Constitutional:      General: She is not in acute distress.    Appearance: She is well-developed.  HENT:     Head: Normocephalic.     Right Ear: External ear normal.     Left Ear: External ear normal.     Nose: Nose normal.  Eyes:     General:        Right eye: No discharge.        Left eye: No discharge.     Conjunctiva/sclera: Conjunctivae normal.     Pupils: Pupils are equal, round, and reactive to light.  Neck:     Thyroid: No thyromegaly.     Vascular: No JVD.     Trachea: No tracheal deviation.  Cardiovascular:     Rate and Rhythm: Normal rate and regular rhythm.     Heart sounds: Normal heart sounds.  Pulmonary:     Effort: No  respiratory distress.     Breath sounds: No stridor. No wheezing.  Abdominal:     General: Bowel sounds are normal. There is no distension.     Palpations: Abdomen is soft. There is no mass.     Tenderness: There is no abdominal tenderness. There is no guarding or rebound.  Musculoskeletal:        General: No tenderness.     Cervical back: Normal range of motion and neck supple.  Lymphadenopathy:     Cervical: No cervical adenopathy.  Skin:    Findings: No erythema or rash.  Neurological:     Mental Status: She is oriented to person, place, and time.     Cranial Nerves: No cranial nerve deficit.     Motor: No abnormal muscle tone.     Coordination: Coordination normal.     Deep Tendon Reflexes: Reflexes normal.  Psychiatric:        Behavior: Behavior normal.        Thought Content: Thought content normal.        Judgment: Judgment normal.   R groin w/pain  Lab Results  Component Value Date   WBC 5.8 05/09/2019   HGB 14.0 05/09/2019   HCT 41.7 05/09/2019   PLT 215.0 05/09/2019   GLUCOSE 91 05/09/2019   CHOL 233 (H) 02/03/2019   TRIG 166.0 (H) 02/03/2019   HDL 72.30 02/03/2019   LDLDIRECT 142.6 07/09/2011   LDLCALC 127 (H) 02/03/2019   ALT 14 05/09/2019   AST 20 05/09/2019   NA 139 05/09/2019   K 4.4 05/09/2019   CL 104 05/09/2019   CREATININE 0.80 05/09/2019   BUN 18 05/09/2019   CO2 28 05/09/2019   TSH 2.33 02/03/2019   HGBA1C 5.6 10/12/2006    CT Abdomen Pelvis W Contrast  Result Date: 05/12/2019 CLINICAL DATA:  Worsening right-sided abdominal pain, distention, and nausea and vomiting for several months. Urinary tract infection. EXAM: CT ABDOMEN AND PELVIS WITH CONTRAST TECHNIQUE: Multidetector CT imaging of the abdomen and pelvis was performed using the standard protocol following bolus administration of intravenous contrast. CONTRAST:  126mL OMNIPAQUE IOHEXOL 300 MG/ML  SOLN COMPARISON:  None. FINDINGS: Lower Chest: No acute findings. Hepatobiliary: No hepatic  masses identified. Gallbladder is unremarkable. No evidence of biliary ductal dilatation. Pancreas:  No mass or inflammatory changes. Spleen: Within normal limits in size and appearance. Adrenals/Urinary Tract: No masses identified. No evidence of ureteral calculi or hydronephrosis. Stomach/Bowel: No evidence of obstruction, inflammatory process or abnormal fluid collections. Diverticulosis is seen mainly involving the sigmoid  colon, however there is no evidence of diverticulitis. Vascular/Lymphatic: No pathologically enlarged lymph nodes. No abdominal aortic aneurysm. Reproductive: Prior hysterectomy noted. Adnexal regions are unremarkable in appearance. Other:  None. Musculoskeletal:  No suspicious bone lesions identified. IMPRESSION: Colonic diverticulosis. No radiographic evidence of diverticulitis or other acute findings. Electronically Signed   By: Marlaine Hind M.D.   On: 05/12/2019 15:09    Assessment & Plan:     Walker Kehr, MD

## 2019-09-26 NOTE — Addendum Note (Signed)
Addended by: Darlys Gales on: 09/26/2019 05:04 PM   Modules accepted: Orders

## 2019-09-26 NOTE — Assessment & Plan Note (Signed)
On Levothroid 

## 2019-09-26 NOTE — Assessment & Plan Note (Signed)
Citalopram  Potential benefits of a long term SSRI use as well as potential risks  and complications were explained to the patient and were aknowledged. 

## 2019-09-26 NOTE — Patient Instructions (Addendum)
Try A1 milk 

## 2019-09-26 NOTE — Assessment & Plan Note (Signed)
Coronary calcium score of 6. This was 46 th percentile for age and sex matched control.

## 2019-09-26 NOTE — Assessment & Plan Note (Signed)
Celexa

## 2019-09-26 NOTE — Assessment & Plan Note (Signed)
Coronary calcium score of 6. This was 22 th percentile for age and sex matched control.

## 2019-09-26 NOTE — Assessment & Plan Note (Signed)
Trazodone  °

## 2019-09-27 LAB — LIPID PANEL
Cholesterol: 243 mg/dL — ABNORMAL HIGH (ref <200)
HDL: 74 mg/dL (ref >=50)
LDL Cholesterol (Calc): 139 mg/dL (calc) — ABNORMAL HIGH
Non-HDL Cholesterol (Calc): 169 mg/dL (calc) — ABNORMAL HIGH (ref <130)
Total CHOL/HDL Ratio: 3.3 (calc) (ref <5.0)
Triglycerides: 160 mg/dL — ABNORMAL HIGH (ref <150)

## 2019-09-27 LAB — COMPLETE METABOLIC PANEL WITH GFR
AG Ratio: 2 (calc) (ref 1.0–2.5)
ALT: 14 U/L (ref 6–29)
AST: 20 U/L (ref 10–35)
Albumin: 4.5 g/dL (ref 3.6–5.1)
Alkaline phosphatase (APISO): 76 U/L (ref 37–153)
BUN: 15 mg/dL (ref 7–25)
CO2: 25 mmol/L (ref 20–32)
Calcium: 9.7 mg/dL (ref 8.6–10.4)
Chloride: 101 mmol/L (ref 98–110)
Creat: 0.72 mg/dL (ref 0.60–0.93)
GFR, Est African American: 98 mL/min/{1.73_m2} (ref >=60)
GFR, Est Non African American: 84 mL/min/{1.73_m2} (ref >=60)
Globulin: 2.3 g/dL (calc) (ref 1.9–3.7)
Glucose, Bld: 92 mg/dL (ref 65–99)
Potassium: 4.4 mmol/L (ref 3.5–5.3)
Sodium: 139 mmol/L (ref 135–146)
Total Bilirubin: 0.6 mg/dL (ref 0.2–1.2)
Total Protein: 6.8 g/dL (ref 6.1–8.1)

## 2019-09-27 LAB — T4, FREE: Free T4: 1.5 ng/dL (ref 0.8–1.8)

## 2019-09-27 LAB — TSH: TSH: 1.31 mIU/L (ref 0.40–4.50)

## 2019-09-28 ENCOUNTER — Other Ambulatory Visit: Payer: Self-pay | Admitting: Gastroenterology

## 2019-09-28 ENCOUNTER — Other Ambulatory Visit (HOSPITAL_COMMUNITY): Payer: Self-pay | Admitting: Gastroenterology

## 2019-09-28 DIAGNOSIS — R1011 Right upper quadrant pain: Secondary | ICD-10-CM | POA: Diagnosis not present

## 2019-09-28 DIAGNOSIS — Z8719 Personal history of other diseases of the digestive system: Secondary | ICD-10-CM | POA: Diagnosis not present

## 2019-09-28 DIAGNOSIS — R1319 Other dysphagia: Secondary | ICD-10-CM | POA: Diagnosis not present

## 2019-09-28 DIAGNOSIS — K219 Gastro-esophageal reflux disease without esophagitis: Secondary | ICD-10-CM | POA: Diagnosis not present

## 2019-09-28 DIAGNOSIS — Z1211 Encounter for screening for malignant neoplasm of colon: Secondary | ICD-10-CM | POA: Diagnosis not present

## 2019-09-29 ENCOUNTER — Ambulatory Visit (INDEPENDENT_AMBULATORY_CARE_PROVIDER_SITE_OTHER): Payer: Medicare Other

## 2019-09-29 ENCOUNTER — Ambulatory Visit: Payer: Medicare Other | Admitting: Family Medicine

## 2019-09-29 ENCOUNTER — Other Ambulatory Visit: Payer: Self-pay

## 2019-09-29 ENCOUNTER — Encounter: Payer: Self-pay | Admitting: Family Medicine

## 2019-09-29 VITALS — BP 110/82 | HR 67 | Ht 62.0 in | Wt 146.0 lb

## 2019-09-29 DIAGNOSIS — M545 Low back pain, unspecified: Secondary | ICD-10-CM

## 2019-09-29 DIAGNOSIS — M25551 Pain in right hip: Secondary | ICD-10-CM

## 2019-09-29 DIAGNOSIS — M5136 Other intervertebral disc degeneration, lumbar region: Secondary | ICD-10-CM | POA: Diagnosis not present

## 2019-09-29 DIAGNOSIS — M51369 Other intervertebral disc degeneration, lumbar region without mention of lumbar back pain or lower extremity pain: Secondary | ICD-10-CM

## 2019-09-29 DIAGNOSIS — M1611 Unilateral primary osteoarthritis, right hip: Secondary | ICD-10-CM | POA: Diagnosis not present

## 2019-09-29 DIAGNOSIS — M169 Osteoarthritis of hip, unspecified: Secondary | ICD-10-CM | POA: Insufficient documentation

## 2019-09-29 IMAGING — DX DG LUMBAR SPINE 2-3V
3 series · 3 of 3 positions shown · non-contrast
Comparison: None.

CLINICAL DATA: Low back pain

EXAM:
LUMBAR SPINE - 2-3 VIEW

[l-spine ap]
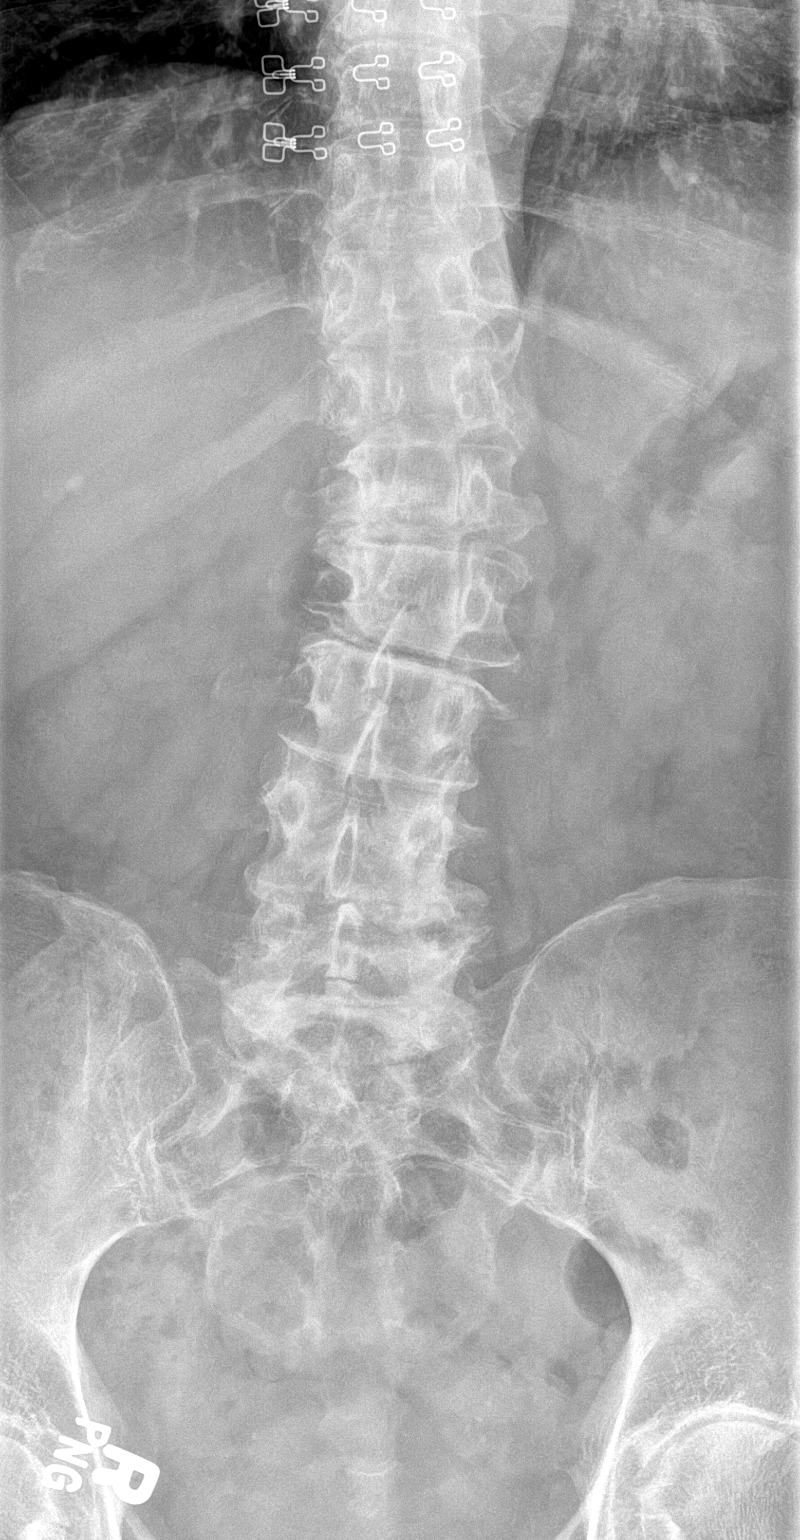

[l-spine lateral (1 of 2)]
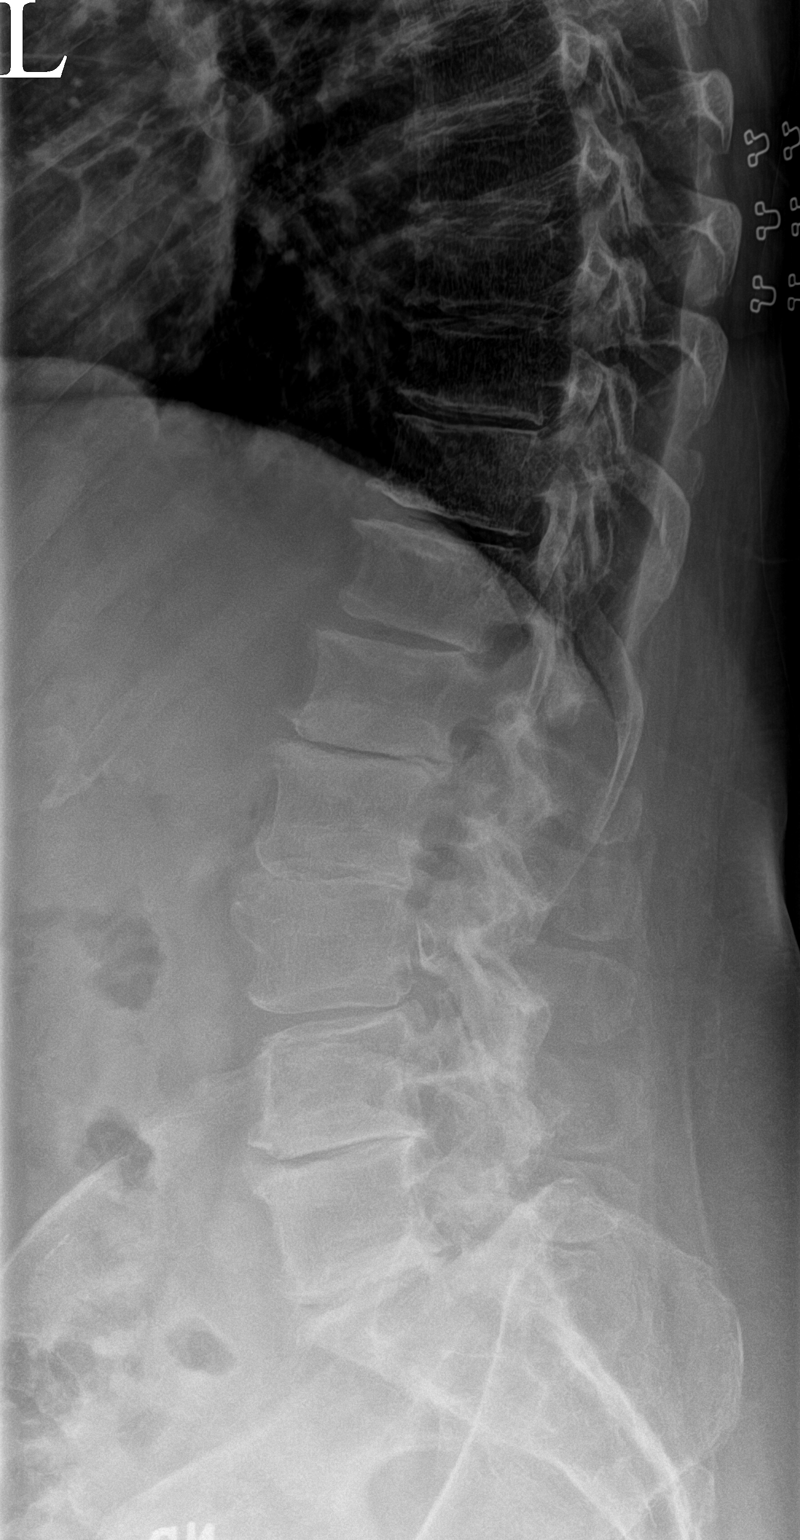

[l-spine lateral (2 of 2)]
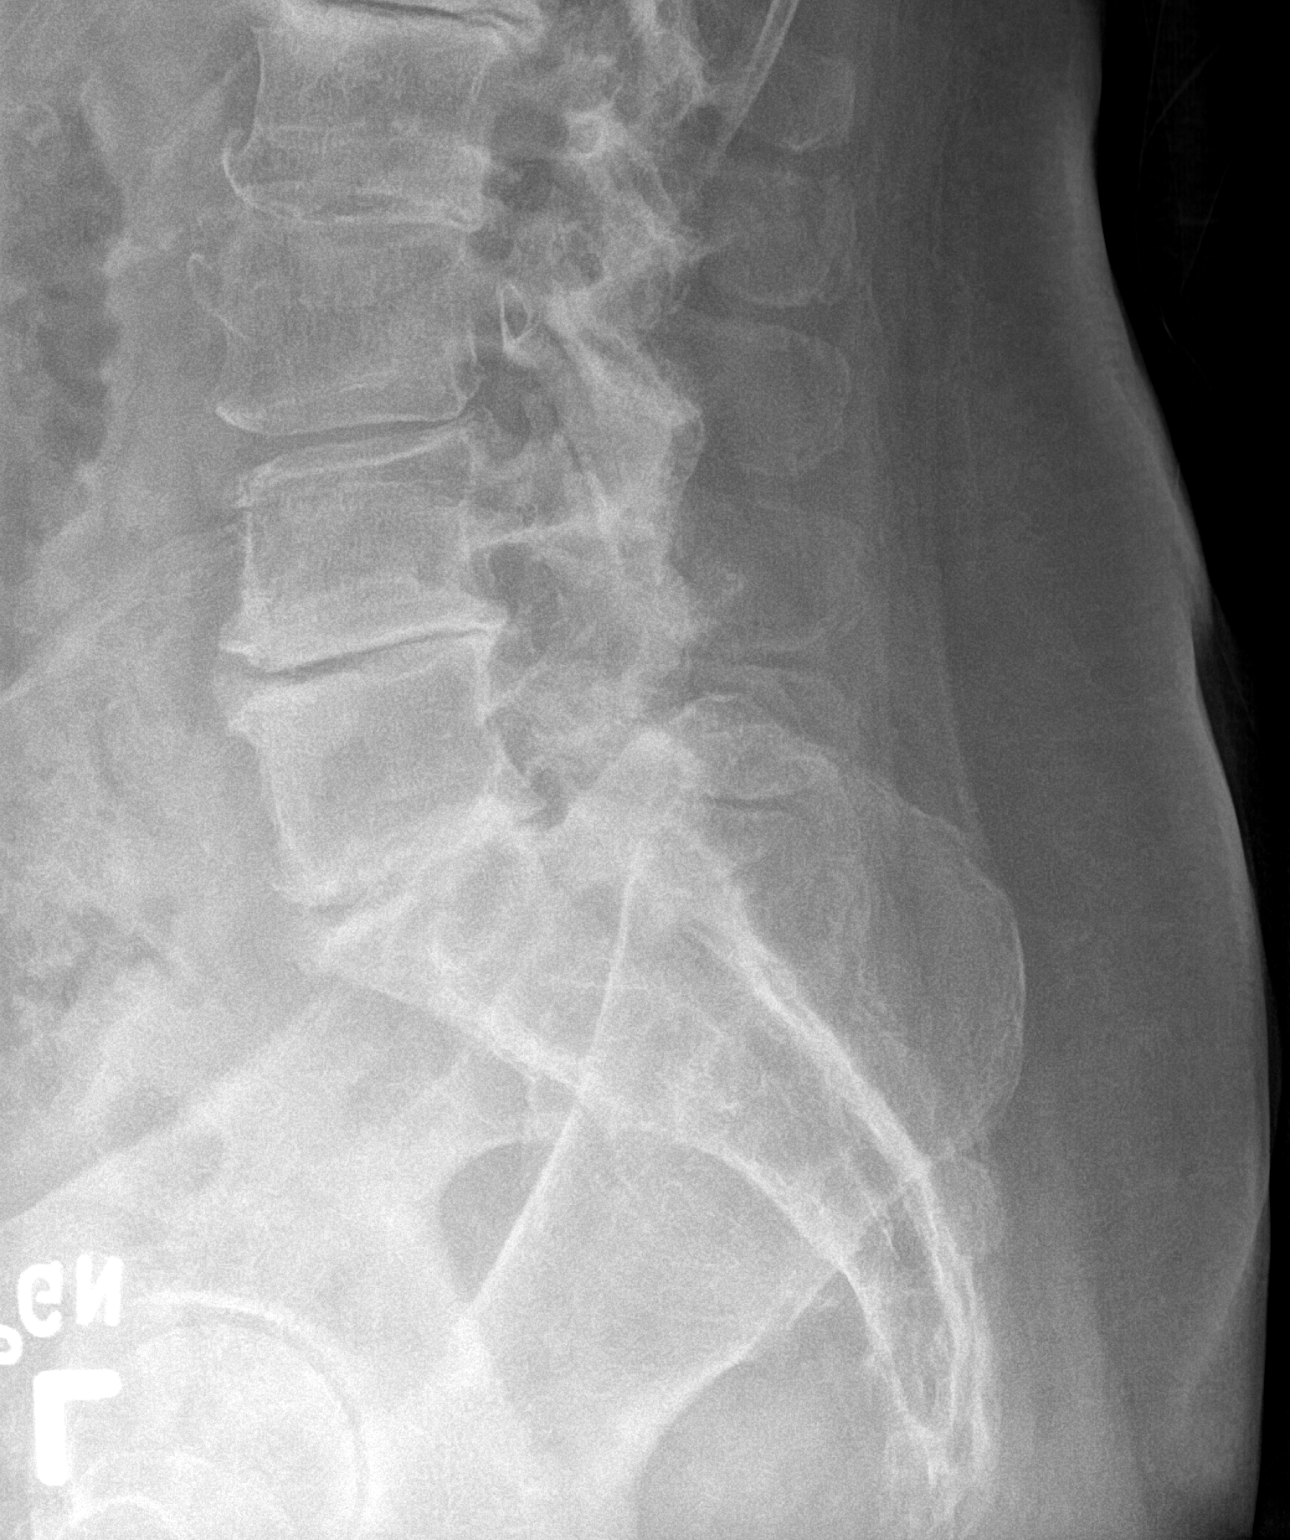

[3 of 3 positions shown; findings below may reference images not displayed]

FINDINGS: There is normal lumbar lordosis. Mild lumbar levoscoliosis, apex
left at L2. Five non rib bearing segments of the lumbar spine.
Vertebral body height has been preserved. No acute fracture of the
lumbar spine. There is diffuse intervertebral disc space narrowing
throughout the lumbar spine in keeping with changes of moderate to
severe degenerative disc disease. This appears most severe at L1-2
and L4-5. Paraspinal soft tissues are unremarkable.
IMPRESSION: Diffuse severe degenerative disc disease throughout the lumbar
spine.

## 2019-09-29 IMAGING — DX DG HIP (WITH OR WITHOUT PELVIS) 2-3V*R*
3 series · 3 of 3 positions shown · non-contrast
Comparison: None.

CLINICAL DATA: Right hip pain

EXAM:
DG HIP (WITH OR WITHOUT PELVIS) 2-3V RIGHT

[pelvis ap]
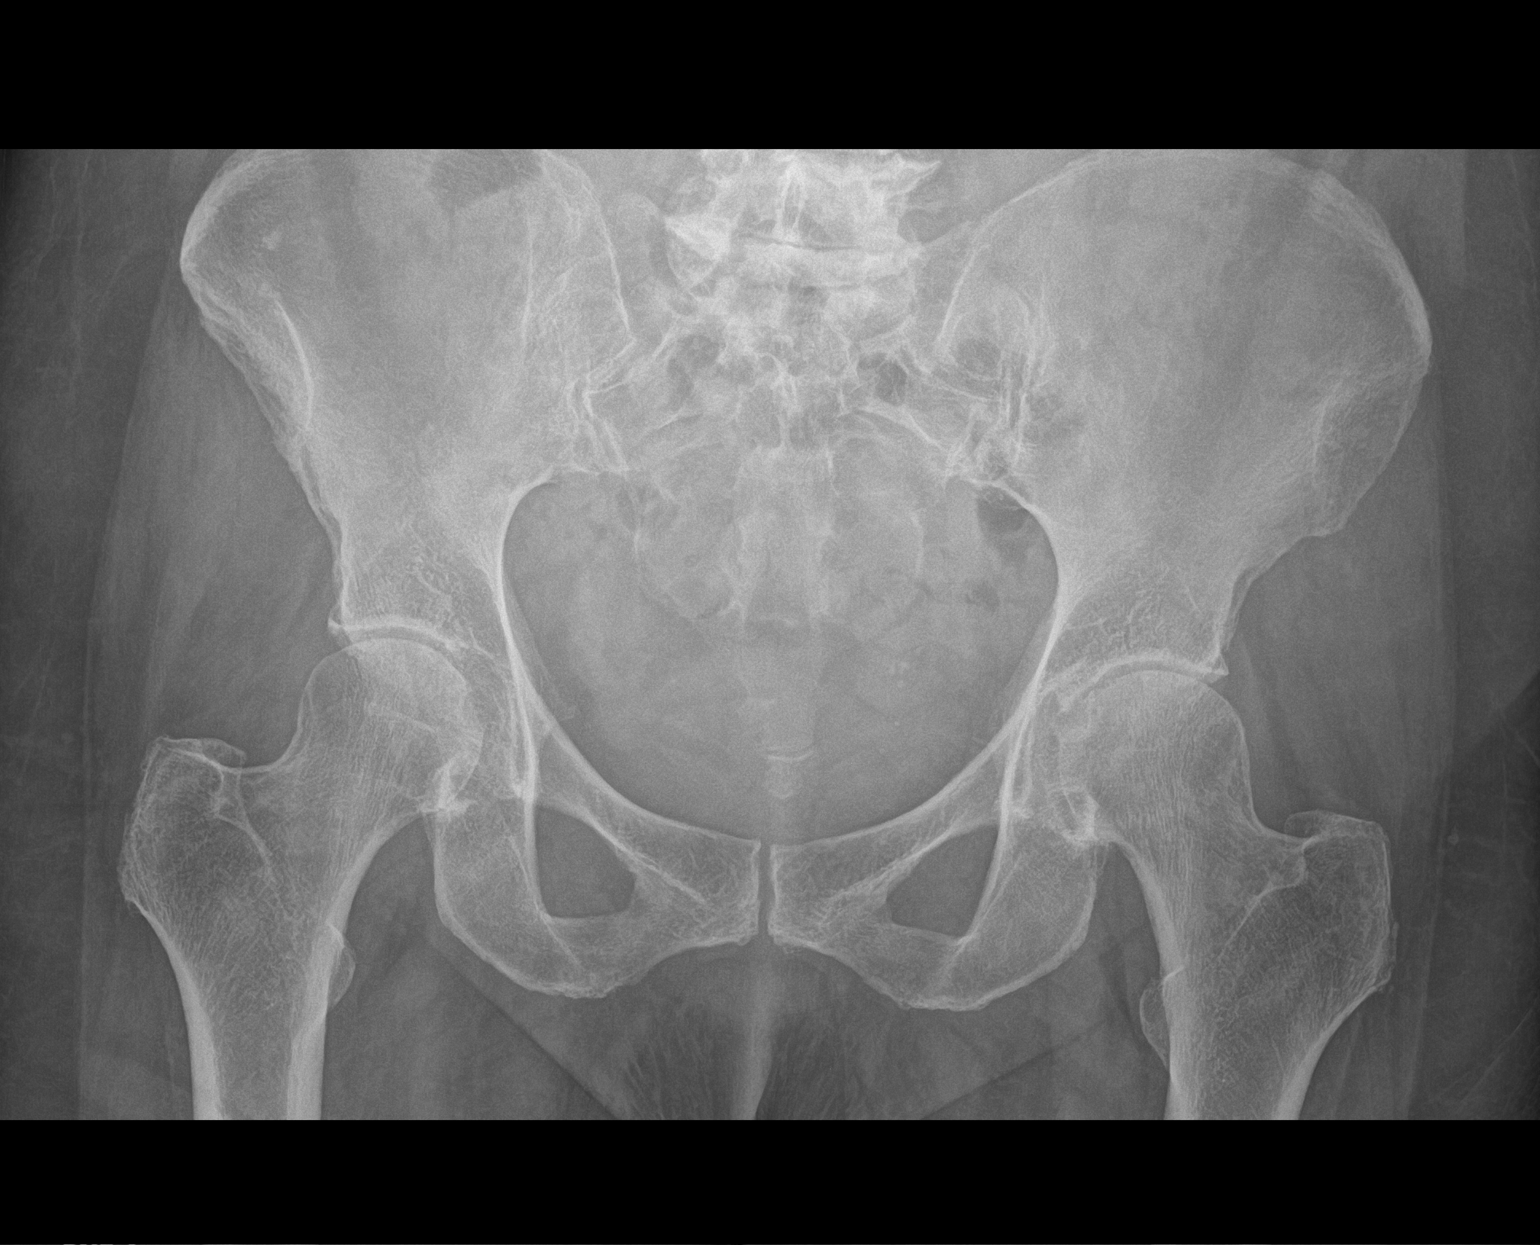

[hip ap]
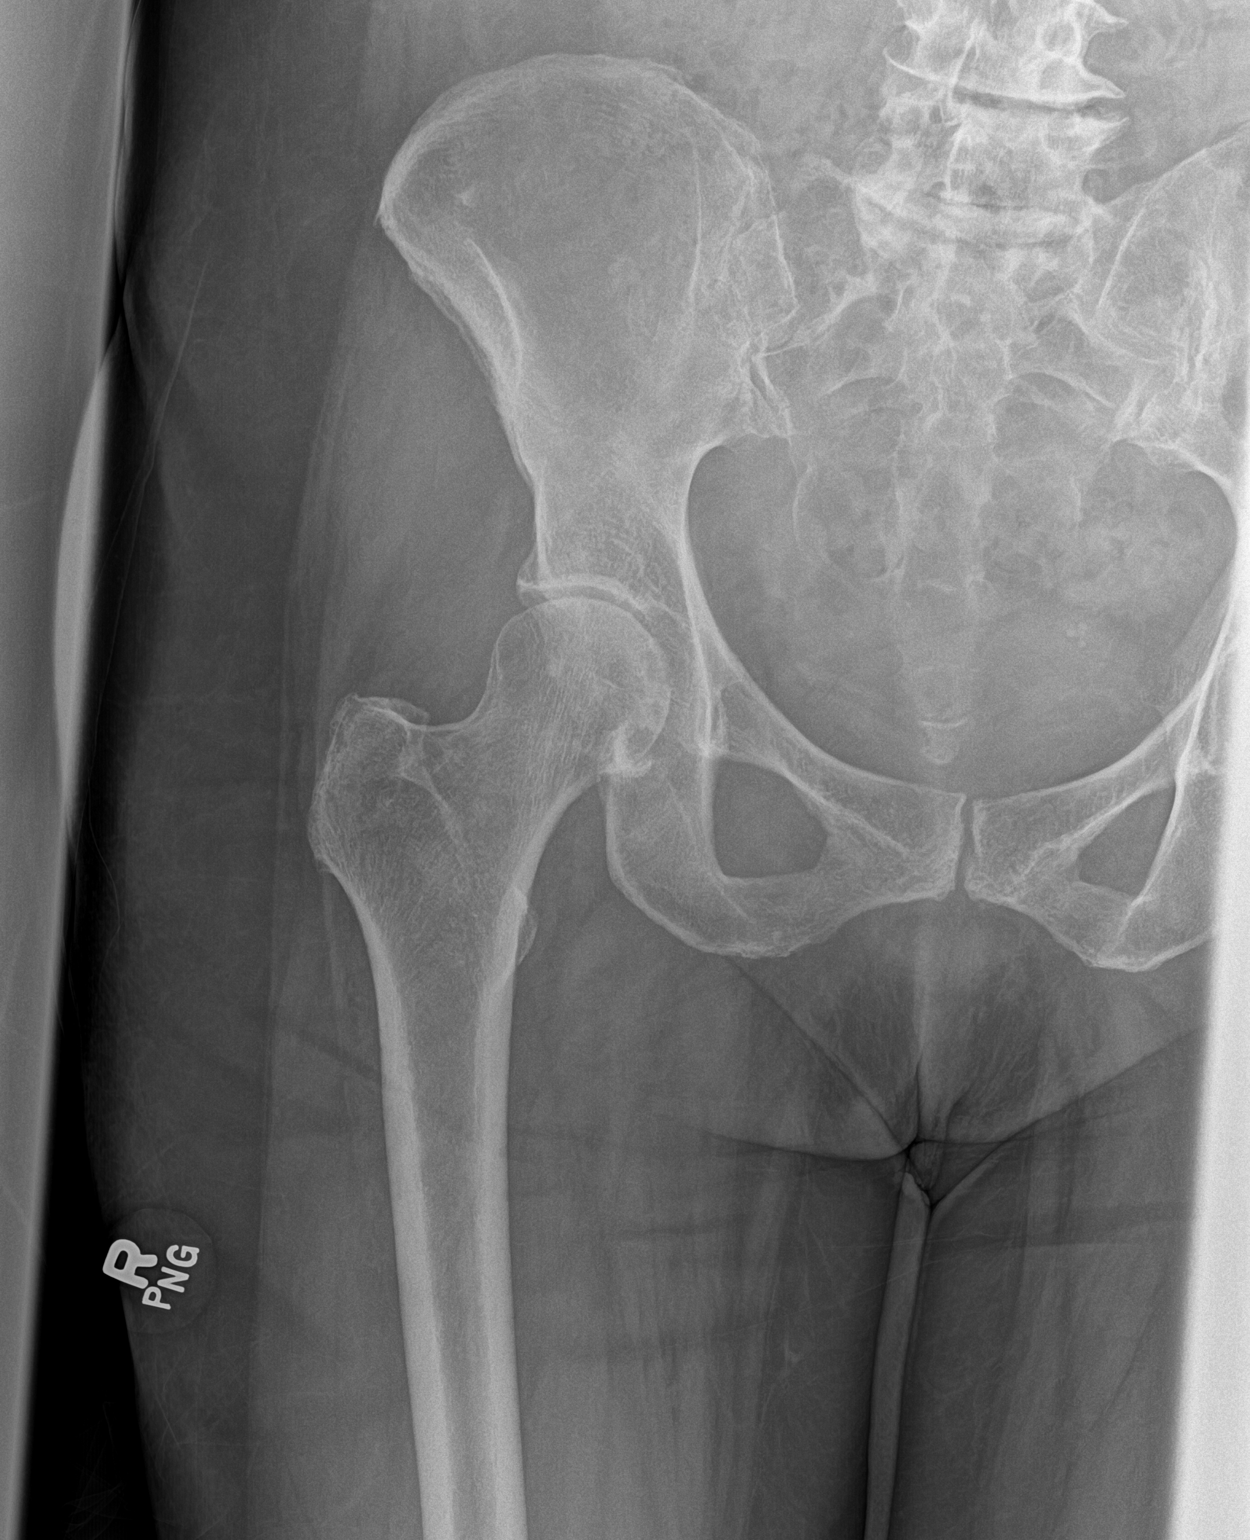

[hip frog leg]
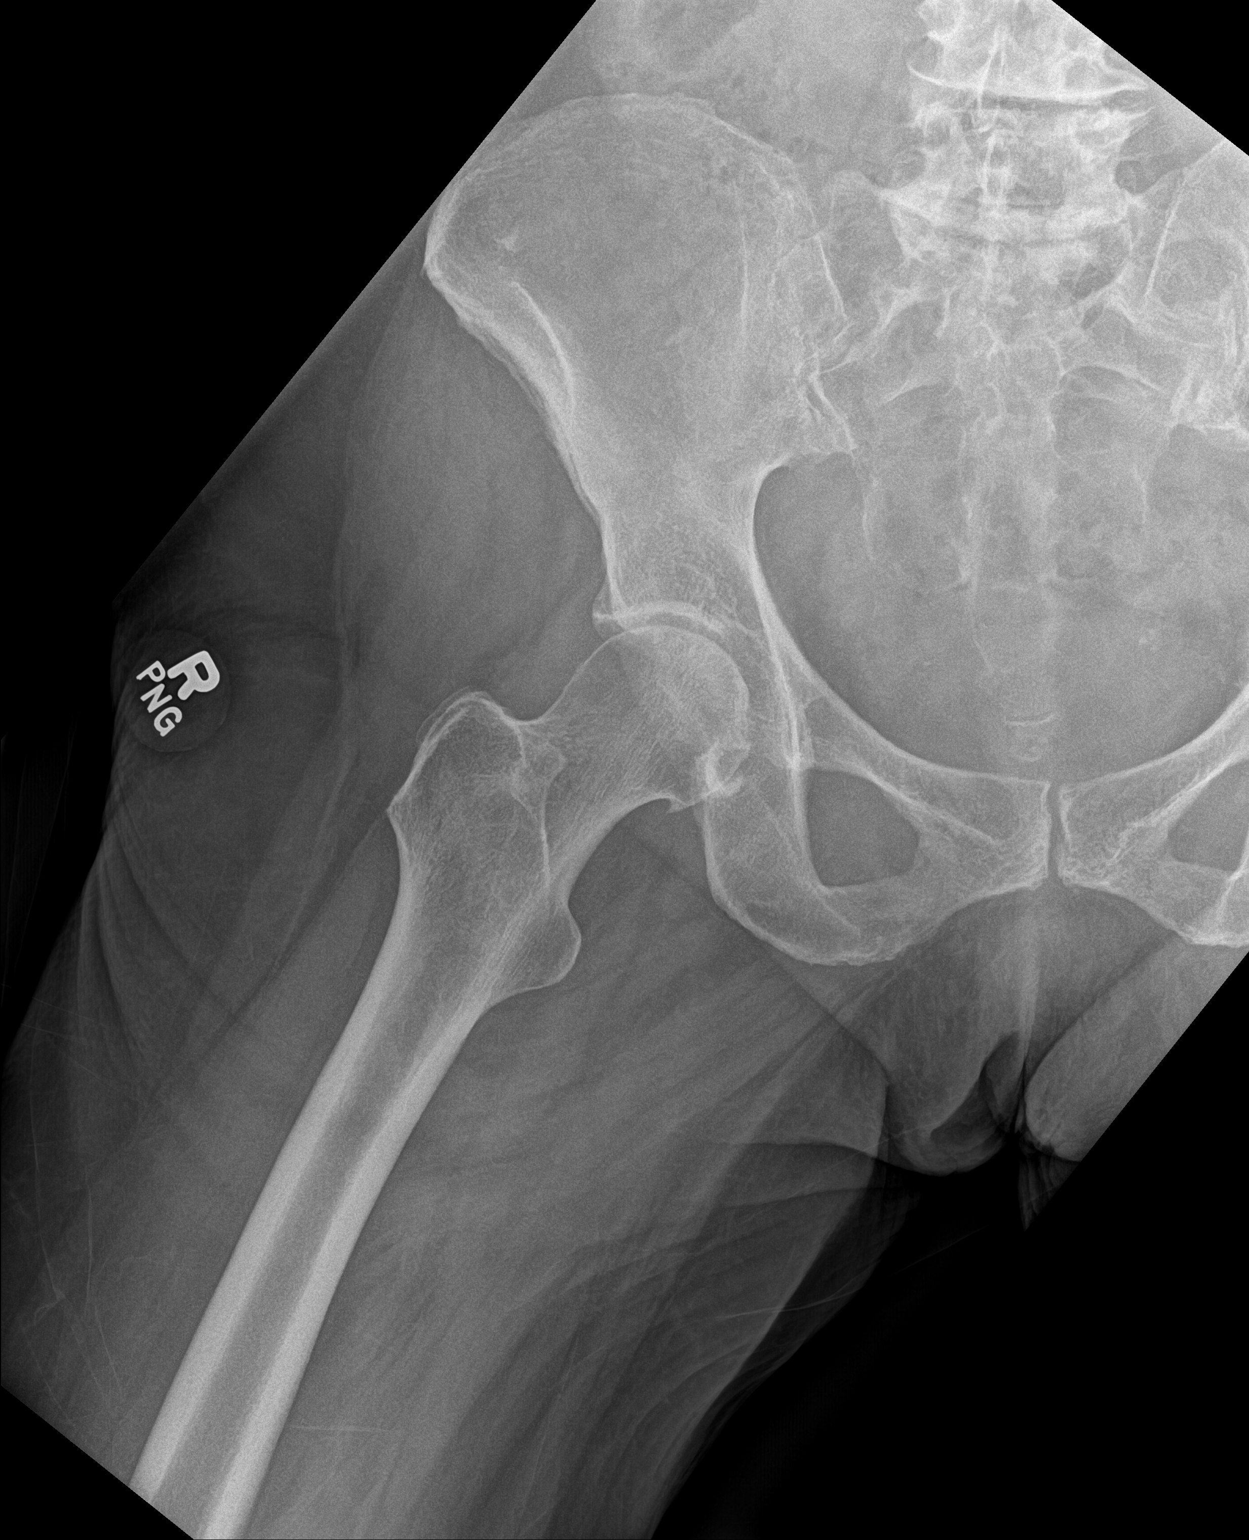

[3 of 3 positions shown; findings below may reference images not displayed]

FINDINGS: Single view radiograph of the pelvis and two view radiograph of the
right hip demonstrate normal alignment. No fracture or dislocation.
Mild right hip degenerative arthritis is noted with joint space
narrowing and osteophyte formation present. Incidentally noted is
moderate degenerative arthritis of the left hip. Soft tissues are
unremarkable.
IMPRESSION: Mild right hip degenerative arthritis.

## 2019-09-29 MED ORDER — GABAPENTIN 100 MG PO CAPS
100.0000 mg | ORAL_CAPSULE | Freq: Two times a day (BID) | ORAL | 3 refills | Status: DC
Start: 2019-09-29 — End: 2020-08-28

## 2019-09-29 NOTE — Patient Instructions (Addendum)
Xray on your way out Ok to start PT Wear compression with a lot of walking and PT Gabapetin 100 mg 2 times a day and continue 300 mg at night Ice is your friend See me again in 6 weeks

## 2019-09-29 NOTE — Assessment & Plan Note (Signed)
Could be contributing to some of the aches and pains as well.  We will monitor closely.  Increase gabapentin to 100 mg in a.m., 100 at night and 300 mg before bedtime.  Patient is starting with physical therapy and will add on some more back exercises for her as well.

## 2019-09-29 NOTE — Assessment & Plan Note (Signed)
Very mild arthritis overall.  Patient does have a bone spur.  May need advanced imaging if this continues well.  Patient CT abdomen pelvis did not show anything remarkable previously in May 2021.  Patient does have previously a groin strain and will start with formal physical therapy.  Can continue with the compression.  Discussed icing regimen, patient is back does show degenerative disc disease and we will increase gabapentin to 100 mg twice daily during the day and 300 mg at night follow-up with me again in 6 weeks

## 2019-09-29 NOTE — Progress Notes (Signed)
[place] Phone: [phone] Subjective:   [name], am serving as a scribe for Dr. Hulan Saas. This visit occurred during the SARS-CoV-2 public health emergency.  Safety protocols were in place, including screening questions prior to the visit, additional usage of staff PPE, and extensive cleaning of exam room while observing appropriate contact time as indicated for disinfecting solutions.   I'm seeing this patient by the request  of:  Plotnikov, Evie Lacks, MD  CC: Right groin pain  OIN:[id number]   08/17/2019 Significant improvement at this time.  Discussed continuing to monitor.  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  Patient will continue to stay active  Patient has what appears to be an acute on chronic meniscal tear with some underlying arthritis.  Would like to try injection today.  Discussed potential bracing, home exercises, icing regimen.  Patient has had trouble with this previously and has done significantly better for years.  X-rays ordered to further evaluate the amount of arthritis.  Follow-up again in 6 weeks  Update 09/29/2019 Aunna Julieth Tugman is a 69 y.o. female coming in with complaint of right knee pain and right groin pain. Patient states that she feels she may have caused more damage to adductor muscle after a 50 mile ride. Right knee feels unstable after her ride.  Patient does not remember any swelling but had difficulty walking after the race initially.  Is able to do better at this time.  Is experiencing lower back pain due to a change in her gait.   Is suppose to begin PT with Barbaraann Barthel on Monday.     Past Medical History:  Diagnosis Date  . ALLERGIC RHINITIS   . ANXIETY   . GERD   . INSOMNIA-SLEEP DISORDER-UNSPEC   . Osteopenia 01/2016   T score -1.2 FRAX 15%/0.9%  . Shingles   . Thyroid  disease    Past Surgical History:  Procedure Laterality Date  . ANKLE FRACTURE SURGERY  2012   R  . APPENDECTOMY    . BREAST SURGERY     REDUCTION MAMMOPLASTY  . CESAREAN SECTION     twice  . COSMETIC SURGERY    . FACELIFT     s/p  . TONSILLECTOMY  1974  . Upper nl TSH w/sx    . VAGINAL HYSTERECTOMY  1991   TVH, irregular bleeding   Social History   Socioeconomic History  . Marital status: Married    Spouse name: Not on file  . Number of children: 2  . Years of education: Not on file  . Highest education level: Not on file  Occupational History  . Occupation: Retired    Fish farm manager: SELF EMPLOYED  Tobacco Use  . Smoking status: Never Smoker  . Smokeless tobacco: Never Used  Vaping Use  . Vaping Use: Never used  Substance and Sexual Activity  . Alcohol use: Yes    Alcohol/week: 10.0 standard drinks    Types: 10 Glasses of wine per week  . Drug use: No  . Sexual activity: Yes    Birth control/protection: Post-menopausal, Surgical    Comment: HYST-1st intercourse 69 yo-Fewer than 5 partners  Other Topics Concern  . Not on file  Social History Narrative   Regular exercise   2 biol children-1 disabled with schizophrenia & 1 stepchild   Social Determinants of Health   Financial Resource Strain:   .  Difficulty of Paying Living Expenses: Not on file  Food Insecurity:   . Worried About Charity fundraiser in the Last Year: Not on file  . Ran Out of Food in the Last Year: Not on file  Transportation Needs:   . Lack of Transportation (Medical): Not on file  . Lack of Transportation (Non-Medical): Not on file  Physical Activity:   . Days of Exercise per Week: Not on file  . Minutes of Exercise per Session: Not on file  Stress:   . Feeling of Stress : Not on file  Social Connections:   . Frequency of Communication with Friends and Family: Not on file  . Frequency of Social Gatherings with Friends and Family: Not on file  . Attends Religious Services: Not on file  .  Active Member of Clubs or Organizations: Not on file  . Attends Archivist Meetings: Not on file  . Marital Status: Not on file   Allergies  Allergen Reactions  . Belviq [Lorcaserin Hcl]     palpitations  . Demerol [Meperidine]     palpitations  . Elavil [Amitriptyline Hcl]     constipation  . Latex Hives, Itching and Rash    Allergy  To latex, hives , redness, itching ,rash  . Medical Adhesive Remover Hives, Itching and Rash   Family History  Problem Relation Age of Onset  . Heart disease Father        CAD/CABG in his 71's  . Hypertension Mother   . Heart failure Mother     Current Outpatient Medications (Endocrine & Metabolic):  .  levothyroxine (SYNTHROID) 125 MCG tablet, Take 1 tablet (125 mcg total) by mouth daily.   Current Outpatient Medications (Respiratory):  .  benzonatate (TESSALON PERLES) 100 MG capsule, Take 1-2 capsules (100-200 mg total) by mouth 3 (three) times daily as needed. .  fluticasone (FLONASE) 50 MCG/ACT nasal spray, Place 2 sprays into both nostrils daily.  Current Outpatient Medications (Analgesics):  .  colchicine 0.6 MG tablet, Take 1 tablet (0.6 mg total) by mouth daily.   Current Outpatient Medications (Other):  .  cephALEXin (KEFLEX) 500 MG capsule, 1 po qd prn for UTI prophylaxis .  Cholecalciferol (VITAMIN D3) 2000 units capsule, Take 1 capsule (2,000 Units total) by mouth daily. .  citalopram (CELEXA) 20 MG tablet, Take 1 tablet (20 mg total) by mouth daily. Annual appt is due must see provider for future refills .  CRANBERRY EXTRACT PO, Take by mouth. .  Estradiol 10 MCG TABS vaginal tablet, Place 1 tablet (10 mcg total) vaginally 2 (two) times a week. .  gabapentin (NEURONTIN) 300 MG capsule, Take 1 capsule (300 mg total) by mouth 3 (three) times daily. .  methocarbamol (ROBAXIN) 500 MG tablet, Take 1 tablet (500 mg total) by mouth daily as needed for muscle spasms. .  Multiple Vitamin (MULTIVITAMIN) tablet, Take 1 tablet by  mouth daily. Life's Abundance .  NON FORMULARY, Allerfex  1 by mouth once daily as needed .  NON FORMULARY,  .  pantoprazole (PROTONIX) 20 MG tablet, Take 1 tablet (20 mg total) by mouth daily. .  traZODone (DESYREL) 50 MG tablet, TAKE 1/2 TO 1 (ONE-HALF TO ONE) TABLET BY MOUTH AT BEDTIME AS NEEDED FOR SLEEP .  Valerian Root 100 MG CAPS, Take 1 capsule by mouth daily.   [name]  zinc gluconate 50 MG tablet, Take 50 mg by mouth daily. [name]  Zoster Vaccine Adjuvanted [facility]) injection,  .  gabapentin (NEURONTIN) 100  MG capsule, Take 1 capsule (100 mg total) by mouth 2 (two) times daily.   Reviewed prior external information including notes and imaging from  primary care provider As well as notes that were available from care everywhere and other healthcare systems.  Past medical history, social, surgical and family history all reviewed in electronic medical record.  No pertanent information unless stated regarding to the chief complaint.   Review of Systems:  No headache, visual changes, nausea, vomiting, diarrhea, constipation, dizziness, abdominal pain, skin rash, fevers, chills, night sweats, weight loss, swollen lymph nodes, body aches, joint swelling, chest pain, shortness of breath, mood changes. POSITIVE muscle aches  Objective  Blood pressure 110/82, pulse 67, height 5\' 2"  (1.575 m), weight 146 lb (66.2 kg), SpO2 98 %.   General: No apparent distress alert and oriented x3 mood and affect normal, dressed appropriately.  HEENT: Pupils equal, extraocular movements intact  Respiratory: Patient's speak in full sentences and does not appear short of breath  Cardiovascular: No lower extremity edema, non tender, no erythema  Neuro: Cranial nerves II through XII are intact, neurovascularly intact in all extremities with 2+ DTRs and 2+ pulses.  Gait antalgic Right hip exam shows the patient does have decreased internal range of motion to only 5 degrees with significant amount of pain in the groin  area.  Abductor is seems to have some mild pain with resistance but very minimal.  Patient actually has more pain with the straight leg test and then FABER test.  Neurovascularly intact distally.  Good strength of the hip though in all planes.  Contralateral hip has very minimal loss of internal range of motion. Low back exam does have some very mild diffuse loss of the lordosis with tenderness to palpation.    Impression and Recommendations:     The above documentation has been reviewed and is accurate and complete Lyndal Pulley, DO       Note: This dictation was prepared with Dragon dictation along with smaller phrase technology. Any transcriptional errors that result from this process are unintentional.

## 2019-10-03 DIAGNOSIS — S76211D Strain of adductor muscle, fascia and tendon of right thigh, subsequent encounter: Secondary | ICD-10-CM | POA: Diagnosis not present

## 2019-10-03 DIAGNOSIS — R1030 Lower abdominal pain, unspecified: Secondary | ICD-10-CM | POA: Diagnosis not present

## 2019-10-03 DIAGNOSIS — M25551 Pain in right hip: Secondary | ICD-10-CM | POA: Diagnosis not present

## 2019-10-05 NOTE — Progress Notes (Signed)
[place] Phone: [phone] Subjective:   Ellen [name], am serving as a scribe for Dr. Hulan Saas. This visit occurred during the SARS-CoV-2 public health emergency.  Safety protocols were in place, including screening questions prior to the visit, additional usage of staff PPE, and extensive cleaning of exam room while observing appropriate contact time as indicated for disinfecting solutions.   I'm seeing this patient by the request  of:  Plotnikov, Evie Lacks, MD  CC: Hip pain follow-up  FGH:[id number]   09/29/2019 Very mild arthritis overall.  Patient does have a bone spur.  May need advanced imaging if this continues well.  Patient CT abdomen pelvis did not show anything remarkable previously in May 2021.  Patient does have previously a groin strain and will start with formal physical therapy.  Can continue with the compression.  Discussed icing regimen, patient is back does show degenerative disc disease and we will increase gabapentin to 100 mg twice daily during the day and 300 mg at night follow-up with me again in 6 weeks  Could be contributing to some of the aches and pains as well.  We will monitor closely.  Increase gabapentin to 100 mg in a.m., 100 at night and 300 mg before bedtime.  Patient is starting with physical therapy and will add on some more back exercises for her as well.  Update 10/06/2019 Ellen [name] is a 69 y.o. female coming in with complaint of hip and low back pain. Patient is not having improvement in hip pain. Patient states that her pain is worse after doing physical therapy.  Patient states is becoming more localized.  Seems to be in the groin area.  Rates the severity of pain is 9 out of 10.  Patient also notes an increase in pain in right ankle. History of fracture and surgical plating of the lateral melouslus.   Increasing pain in the right knee as well.  Describes it as a  dull, throbbing aching pain.  Starting to affect the way she is walking on a regular basis.    Past Medical History:  Diagnosis Date  . ALLERGIC RHINITIS   . ANXIETY   . GERD   . INSOMNIA-SLEEP DISORDER-UNSPEC   . Osteopenia 01/2016   T score -1.2 FRAX 15%/0.9%  . Shingles   . Thyroid disease    Past Surgical History:  Procedure Laterality Date  . ANKLE FRACTURE SURGERY  2012   R  . APPENDECTOMY    . BREAST SURGERY     REDUCTION MAMMOPLASTY  . CESAREAN SECTION     twice  . COSMETIC SURGERY    . FACELIFT     s/p  . TONSILLECTOMY  1974  . Upper nl TSH w/sx    . VAGINAL HYSTERECTOMY  1991   TVH, irregular bleeding   Social History   Socioeconomic History  . Marital status: Married    Spouse name: Not on file  . Number of children: 2  . Years of education: Not on file  . Highest education level: Not on file  Occupational History  . Occupation: Retired    Fish farm manager: SELF EMPLOYED  Tobacco Use  . Smoking status: Never Smoker  . Smokeless tobacco: Never Used  Vaping Use  . Vaping Use: Never used  Substance and Sexual Activity  . Alcohol use: Yes    Alcohol/week: 10.0 standard drinks    Types: 10 Glasses of wine per week  .  Drug use: [name]  . Sexual activity: Yes    Birth control/protection: Post-menopausal, Surgical    Comment: HYST-1st intercourse 69 yo-Fewer than 5 partners  Other Topics Concern  . Not on file  Social History Narrative   Regular exercise   2 biol children-1 disabled with schizophrenia & 1 stepchild   Social Determinants of Health   Financial Resource Strain:   . Difficulty of Paying Living Expenses: Not on file  Food Insecurity:   . Worried About Charity fundraiser in the Last Year: Not on file  . Ran Out of Food in the Last Year: Not on file  Transportation Needs:   . Lack of Transportation (Medical): Not on file  . Lack of Transportation (Non-Medical): Not on file  Physical Activity:   . Days of Exercise per Week: Not on file  .  Minutes of Exercise per Session: Not on file  Stress:   . Feeling of Stress : Not on file  Social Connections:   . Frequency of Communication with Friends and Family: Not on file  . Frequency of Social Gatherings with Friends and Family: Not on file  . Attends Religious Services: Not on file  . Active Member of Clubs or Organizations: Not on file  . Attends Archivist Meetings: Not on file  . Marital Status: Not on file   Allergies  Allergen Reactions  . Belviq [Lorcaserin Hcl]     palpitations  . Demerol [Meperidine]     palpitations  . Elavil [Amitriptyline Hcl]     constipation  . Latex Hives, Itching and Rash    Allergy  To latex, hives , redness, itching ,rash  . Medical Adhesive Remover Hives, Itching and Rash   Family History  Problem Relation Age of Onset  . Heart disease Father        CAD/CABG in his 4's  . Hypertension Mother   . Heart failure Mother     Current Outpatient Medications (Endocrine & Metabolic):  .  levothyroxine (SYNTHROID) 125 MCG tablet, Take 1 tablet (125 mcg total) by mouth daily.   Current Outpatient Medications (Respiratory):  .  benzonatate (TESSALON PERLES) 100 MG capsule, Take 1-2 capsules (100-200 mg total) by mouth 3 (three) times daily as needed. .  fluticasone (FLONASE) 50 MCG/ACT nasal spray, Place 2 sprays into both nostrils daily.  Current Outpatient Medications (Analgesics):  .  colchicine 0.6 MG tablet, Take 1 tablet (0.6 mg total) by mouth daily.   Current Outpatient Medications (Other):  .  cephALEXin (KEFLEX) 500 MG capsule, 1 po qd prn for UTI prophylaxis .  Cholecalciferol (VITAMIN D3) 2000 units capsule, Take 1 capsule (2,000 Units total) by mouth daily. .  citalopram (CELEXA) 20 MG tablet, Take 1 tablet (20 mg total) by mouth daily. Annual appt is due must see provider for future refills .  CRANBERRY EXTRACT PO, Take by mouth. .  Estradiol 10 MCG TABS vaginal tablet, Place 1 tablet (10 mcg total) vaginally 2  (two) times a week. .  gabapentin (NEURONTIN) 100 MG capsule, Take 1 capsule (100 mg total) by mouth 2 (two) times daily. [name]  gabapentin (NEURONTIN) 300 MG capsule, Take 1 capsule (300 mg total) by mouth 3 (three) times daily. .  methocarbamol (ROBAXIN) 500 MG tablet, Take 1 tablet (500 mg total) by mouth daily as needed for muscle spasms. .  Multiple Vitamin (MULTIVITAMIN) tablet, Take 1 tablet by mouth daily. Life's Abundance .  NON FORMULARY, Allerfex  1 by mouth once daily  as needed .  NON FORMULARY,  .  pantoprazole (PROTONIX) 20 MG tablet, Take 1 tablet (20 mg total) by mouth daily. .  traZODone (DESYREL) 50 MG tablet, TAKE 1/2 TO 1 (ONE-HALF TO ONE) TABLET BY MOUTH AT BEDTIME AS NEEDED FOR SLEEP .  Valerian Root 100 MG CAPS, Take 1 capsule by mouth daily.   [name]  zinc gluconate 50 MG tablet, Take 50 mg by mouth daily. [name]  Zoster Vaccine Adjuvanted [facility]) injection,    Reviewed prior external information including notes and imaging from  primary care provider As well as notes that were available from care everywhere and other healthcare systems.  Past medical history, social, surgical and family history all reviewed in electronic medical record.  [name] pertanent information unless stated regarding to the chief complaint.   Review of Systems:  [name] headache, visual changes, nausea, vomiting, diarrhea, constipation, dizziness, abdominal pain, skin rash, fevers, chills, night sweats, weight loss, swollen lymph nodes, body aches, joint swelling, chest pain, shortness of breath, mood changes. POSITIVE muscle aches  Objective  Blood pressure 118/82, pulse 64, height 5\' 2"  (1.575 m).   General: [name] apparent distress alert and oriented x3 mood and affect normal, dressed appropriately.  HEENT: Pupils equal, extraocular movements intact  Respiratory: Patient's speak in full sentences and does not appear short of breath  Cardiovascular: [name] lower extremity edema, non tender, [name] erythema  Gait  severely antalgic MSK: Right hip does have some decrease in range of motion From previous exam only months ago.  Patient has severe pain with internal rotation.  Positive fulcrum.  Neurovascularly intact distally.  Right knee does have some mild lateral tracking of the patella noted.  Nontender to palpation diffusely.  All ligaments appear to be intact.  After informed written and verbal consent, patient was seated on exam table. Right knee was prepped with alcohol swab and utilizing anterolateral approach, patient's right knee space was injected with 4:1  marcaine 0.5%: Kenalog 40mg /dL. Patient tolerated the procedure well without immediate complications.   Impression and Recommendations:     The above documentation has been reviewed and is accurate and complete Lyndal Pulley, DO

## 2019-10-06 ENCOUNTER — Encounter: Payer: Self-pay | Admitting: Family Medicine

## 2019-10-06 ENCOUNTER — Ambulatory Visit (INDEPENDENT_AMBULATORY_CARE_PROVIDER_SITE_OTHER): Payer: Medicare Other | Admitting: Family Medicine

## 2019-10-06 ENCOUNTER — Other Ambulatory Visit: Payer: Self-pay

## 2019-10-06 VITALS — BP 118/82 | HR 64 | Ht 62.0 in

## 2019-10-06 DIAGNOSIS — S83241A Other tear of medial meniscus, current injury, right knee, initial encounter: Secondary | ICD-10-CM | POA: Diagnosis not present

## 2019-10-06 DIAGNOSIS — R1031 Right lower quadrant pain: Secondary | ICD-10-CM

## 2019-10-06 DIAGNOSIS — M1611 Unilateral primary osteoarthritis, right hip: Secondary | ICD-10-CM

## 2019-10-06 NOTE — Assessment & Plan Note (Signed)
Patient given injection today, tolerated the procedure well, discussed icing regimen and home exercises.  I do believe that this is getting exacerbated secondary to the hip pain.

## 2019-10-06 NOTE — Patient Instructions (Signed)
[facility] [phone] 

## 2019-10-06 NOTE — Assessment & Plan Note (Signed)
Known mild to moderate arthritic changes of the hip but patient's pain seems to be worsening.  Concern for either an intratrochanteric fracture versus a stress fracture.  Discussed icing regimen and home exercises, discussed MRI at this time to further evaluate.  We will see if there is any other type of muscle injury as well in this area.  Follow-up with me again after imaging and will discuss further.

## 2019-10-09 ENCOUNTER — Other Ambulatory Visit: Payer: Self-pay

## 2019-10-09 ENCOUNTER — Ambulatory Visit (INDEPENDENT_AMBULATORY_CARE_PROVIDER_SITE_OTHER): Payer: Medicare Other

## 2019-10-09 DIAGNOSIS — R1031 Right lower quadrant pain: Secondary | ICD-10-CM

## 2019-10-09 DIAGNOSIS — R600 Localized edema: Secondary | ICD-10-CM | POA: Diagnosis not present

## 2019-10-09 DIAGNOSIS — M25551 Pain in right hip: Secondary | ICD-10-CM | POA: Diagnosis not present

## 2019-10-09 DIAGNOSIS — M25451 Effusion, right hip: Secondary | ICD-10-CM | POA: Diagnosis not present

## 2019-10-09 DIAGNOSIS — M1611 Unilateral primary osteoarthritis, right hip: Secondary | ICD-10-CM | POA: Diagnosis not present

## 2019-10-09 DIAGNOSIS — W010XXA Fall on same level from slipping, tripping and stumbling without subsequent striking against object, initial encounter: Secondary | ICD-10-CM | POA: Diagnosis not present

## 2019-10-09 DIAGNOSIS — M6258 Muscle wasting and atrophy, not elsewhere classified, other site: Secondary | ICD-10-CM | POA: Diagnosis not present

## 2019-10-09 IMAGING — MR MR HIP*R* W/O CM
5 series · 29 of 40 positions shown · non-contrast
Comparison: Right hip x-rays dated [DATE].

CLINICAL DATA: Persistent right hip and groin pain since falling
into a split 6 months ago.

EXAM:
MR OF THE RIGHT HIP WITHOUT CONTRAST
TECHNIQUE: Multiplanar, multisequence MR imaging was performed. No intravenous
contrast was administered.

[Series 3: T1 · coronal · 4.0mm · 0.85mm/px · 8 of 35 slices shown]
[im 1/35]
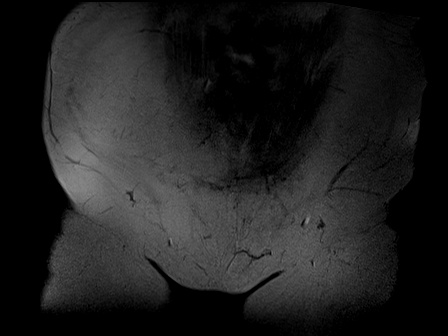
[im 4/35]
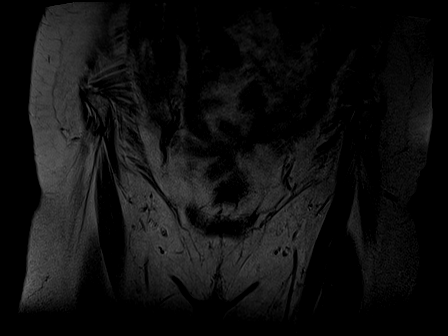
[im 12/35]
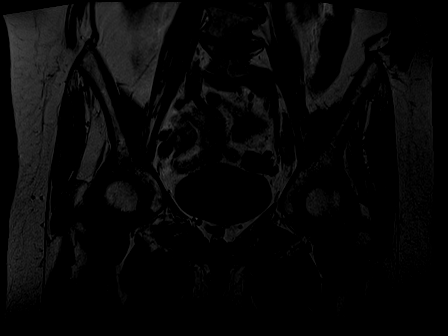
[im 16/35]
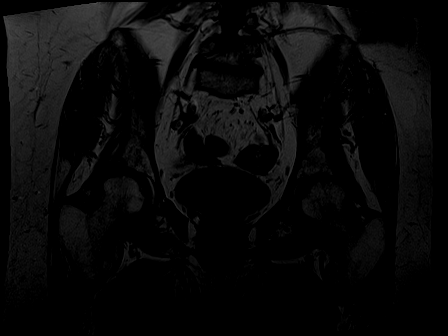
[im 19/35]
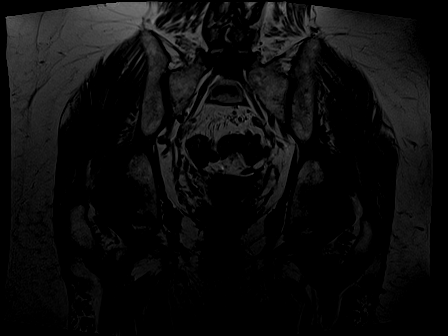
[im 23/35]
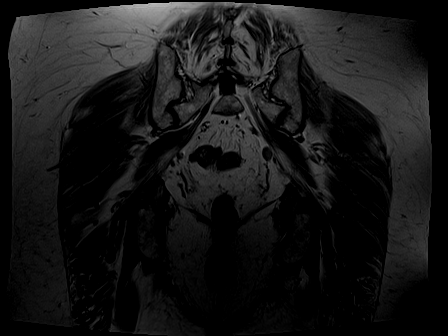
[im 31/35]
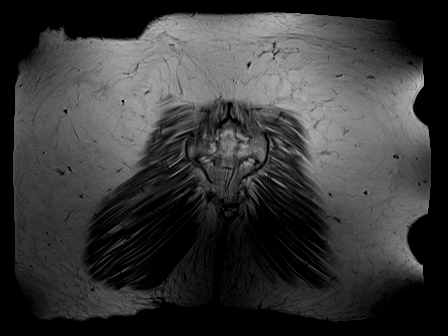
[im 35/35]
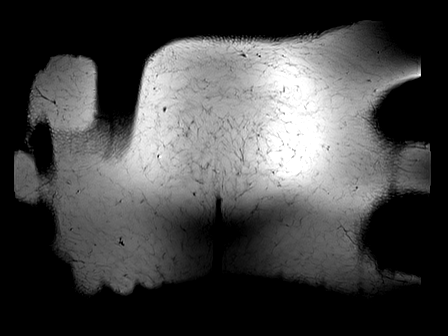

[Series 4: STIR · coronal · 4.0mm · 1.19mm/px · 8 of 35 slices shown (1 of 2)]
[im 1/35]
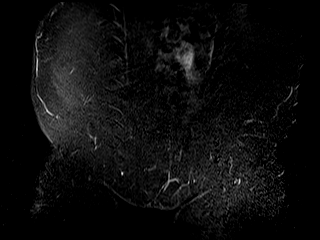
[im 4/35]
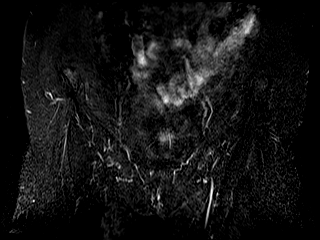
[im 12/35]
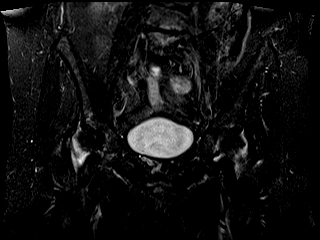
[im 16/35]
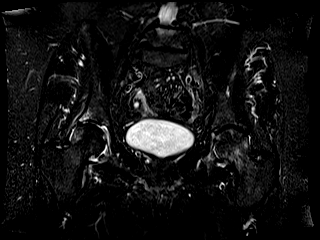
[im 19/35]
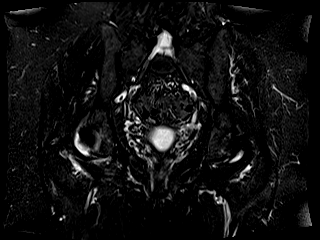
[im 23/35]
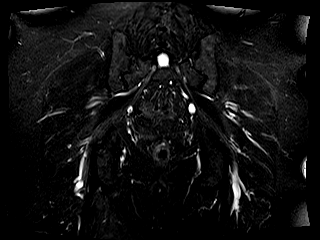
[im 31/35]
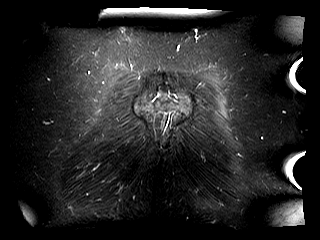
[im 35/35]
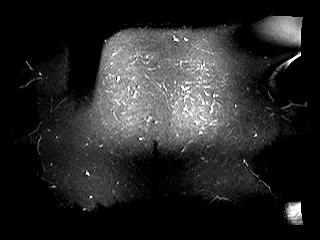

[Series 5: STIR · axial · 4.0mm · 0.70mm/px · 1 of 28 slices shown (2 of 2)]
[im 1/28]
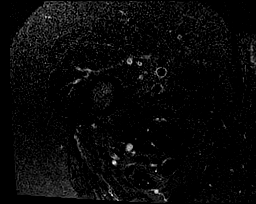

[Series 6: PD fat-sat · sagittal · 4.5mm · 0.35mm/px · 6 of 23 slices shown (1 of 2)]
[im 1/23]
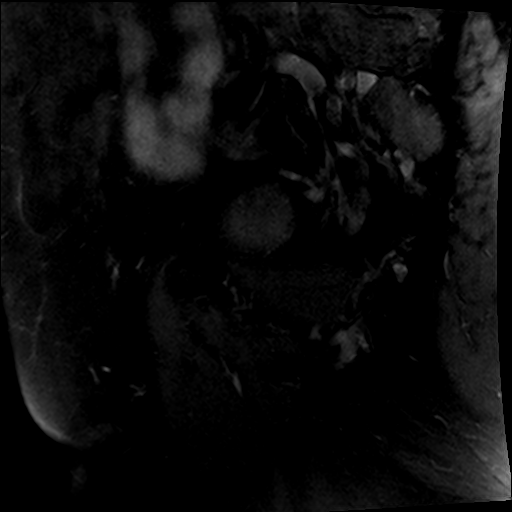
[im 5/23]
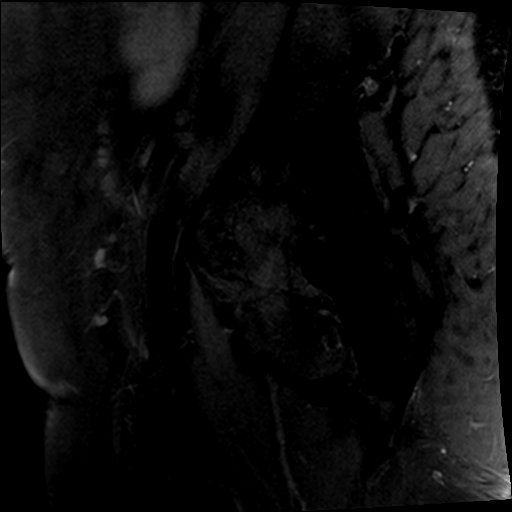
[im 9/23]
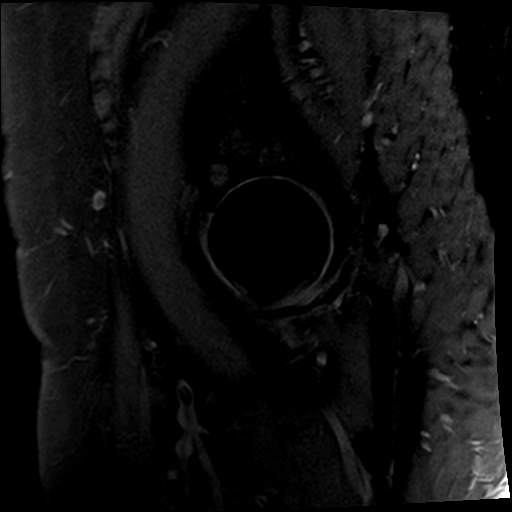
[im 14/23]
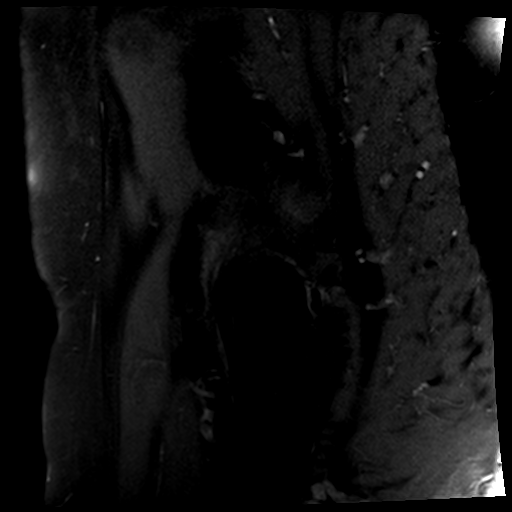
[im 18/23]
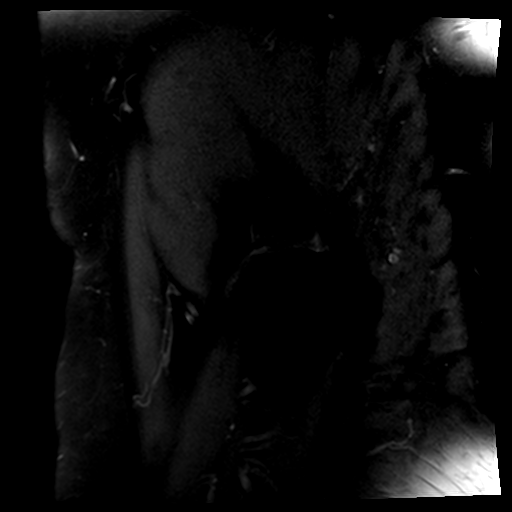
[im 23/23]
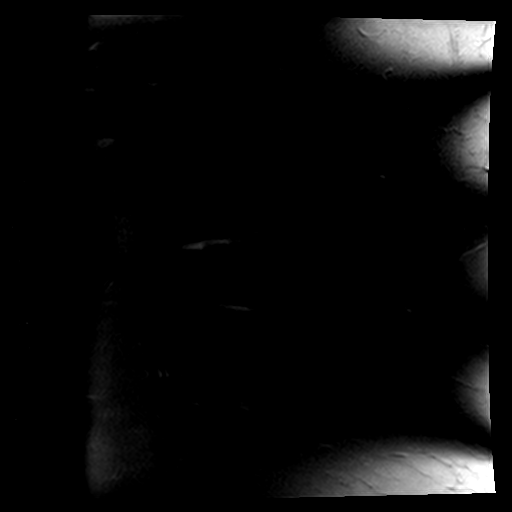

[Series 7: PD fat-sat · coronal · 4.5mm · 0.35mm/px · 6 of 23 slices shown (2 of 2)]
[im 1/23]
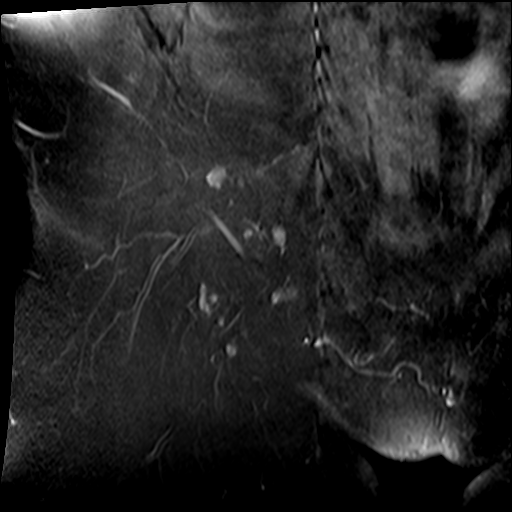
[im 5/23]
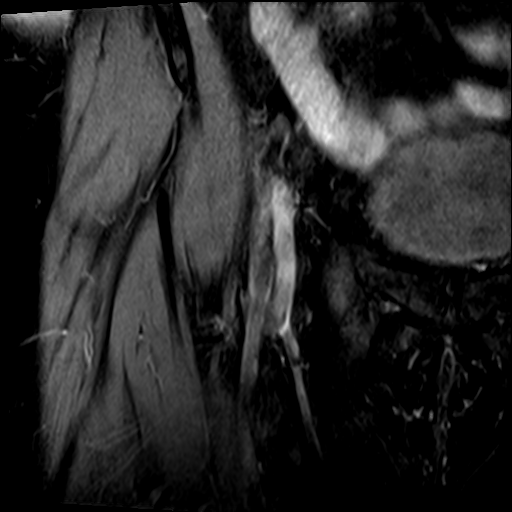
[im 9/23]
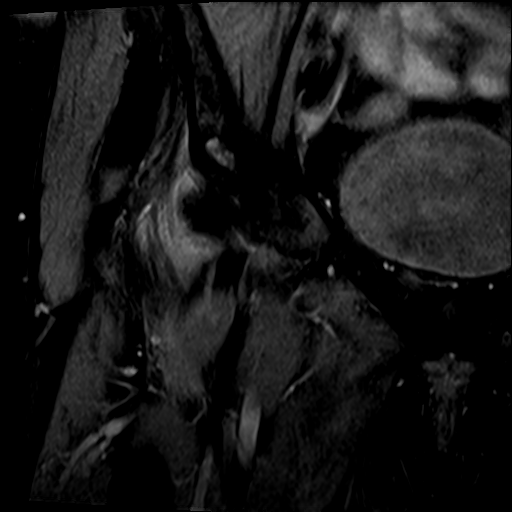
[im 14/23]
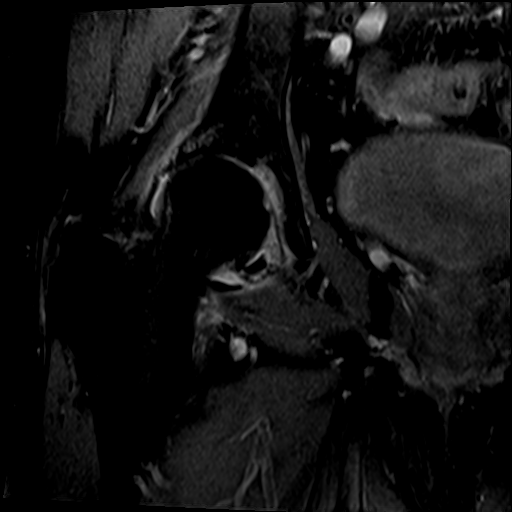
[im 18/23]
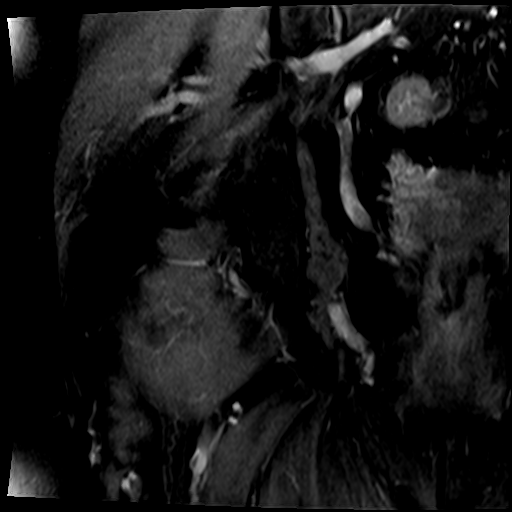
[im 23/23]
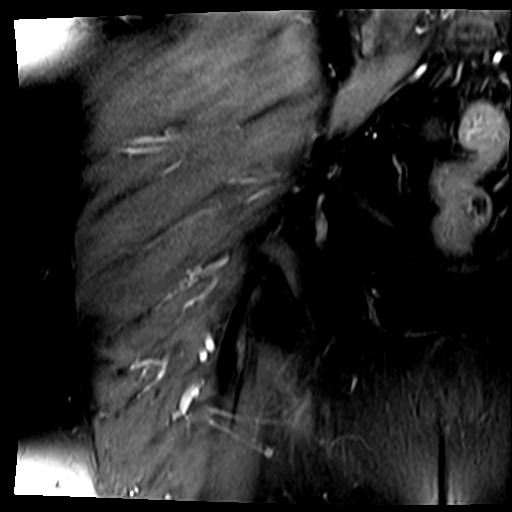

[29 of 40 positions shown; findings below may reference images not displayed]

FINDINGS: Bones: There is no evidence of acute fracture, dislocation or
avascular necrosis. No focal bone lesion. The visualized sacroiliac
joints and symphysis pubis appear normal. Degenerative disc disease
at L4-L5 and L5-S1.

Articular cartilage and labrum

Articular cartilage: Partial and full-thickness cartilage loss in
the right hip joint with subchondral marrow edema in the
superolateral acetabulum and femoral head. Mild partial-thickness
cartilage loss in the left hip joint with trace subchondral marrow
edema in the femoral head.

Labrum: Blunting/degenerative tearing of the right anterior superior
labrum.

Joint or bursal effusion

Joint effusion: Trace bilateral hip joint effusions.

Bursae: No focal periarticular fluid collection.

Muscles and tendons

Muscles and tendons: The visualized gluteus, hamstring and iliopsoas
tendons appear normal. Atrophy of the right gluteus minimus muscle.
No muscle edema.

Other findings

Miscellaneous: Prior hysterectomy.  Sigmoid colonic diverticulosis.
IMPRESSION: 1. Moderate right and mild left hip osteoarthritis.
2. Blunting/degenerative tearing of the right anterior superior
labrum.

## 2019-10-10 ENCOUNTER — Encounter: Payer: Self-pay | Admitting: Family Medicine

## 2019-10-10 ENCOUNTER — Other Ambulatory Visit: Payer: Self-pay

## 2019-10-10 DIAGNOSIS — H04123 Dry eye syndrome of bilateral lacrimal glands: Secondary | ICD-10-CM | POA: Diagnosis not present

## 2019-10-10 DIAGNOSIS — H43813 Vitreous degeneration, bilateral: Secondary | ICD-10-CM | POA: Diagnosis not present

## 2019-10-10 DIAGNOSIS — H2513 Age-related nuclear cataract, bilateral: Secondary | ICD-10-CM | POA: Diagnosis not present

## 2019-10-10 DIAGNOSIS — S73191D Other sprain of right hip, subsequent encounter: Secondary | ICD-10-CM

## 2019-10-11 ENCOUNTER — Ambulatory Visit: Payer: Self-pay

## 2019-10-11 ENCOUNTER — Encounter: Payer: Self-pay | Admitting: Family Medicine

## 2019-10-11 ENCOUNTER — Ambulatory Visit: Payer: Medicare Other | Admitting: Family Medicine

## 2019-10-11 ENCOUNTER — Ambulatory Visit (INDEPENDENT_AMBULATORY_CARE_PROVIDER_SITE_OTHER): Payer: Medicare Other | Admitting: Family Medicine

## 2019-10-11 ENCOUNTER — Other Ambulatory Visit: Payer: Self-pay

## 2019-10-11 VITALS — BP 130/70 | HR 70 | Ht 62.0 in | Wt 146.0 lb

## 2019-10-11 DIAGNOSIS — M25551 Pain in right hip: Secondary | ICD-10-CM

## 2019-10-11 DIAGNOSIS — M1611 Unilateral primary osteoarthritis, right hip: Secondary | ICD-10-CM

## 2019-10-11 NOTE — Progress Notes (Signed)
[facility] [address] [place] Phone: [phone] Subjective:   I Ellen [name] am serving as a Education administrator for Dr. Hulan Saas.  This visit occurred during the SARS-CoV-2 public health emergency.  Safety protocols were in place, including screening questions prior to the visit, additional usage of staff PPE, and extensive cleaning of exam room while observing appropriate contact time as indicated for disinfecting solutions.   I'm seeing this patient by the request  of:  Plotnikov, Evie Lacks, MD  CC: Right hip pain follow-up  [id number]   10/06/2019 Known mild to moderate arthritic changes of the hip but patient's pain seems to be worsening.  Concern for either an intratrochanteric fracture versus a stress fracture.  Discussed icing regimen and home exercises, discussed MRI at this time to further evaluate.  We will see if there is any other type of muscle injury as well in this area.  Follow-up with me again after imaging and will discuss further.   Update 10/11/2019 Ellen [name] is a 69 y.o. female coming in with complaint of right hip pain. Is here for injection today. Patient is wearing a hip brace today.  Patient did finally have an MRI done.  Shows that patient does have moderate arthritic changes of the right hip with a tearing of the right anterior superior labrum.  Likely what is contributing to patient's pain at this time. Patient continues to have difficulty with walking and having to use of brace.  Patient does not use a crutch as much.  Patient feels that the quality of life is just decreased from initially at this time.    Past Medical History:  Diagnosis Date  . ALLERGIC RHINITIS   . ANXIETY   . GERD   . INSOMNIA-SLEEP DISORDER-UNSPEC   . Osteopenia 01/2016   T score -1.2 FRAX 15%/0.9%  . Shingles   . Thyroid disease    Past Surgical History:  Procedure Laterality Date  . ANKLE FRACTURE SURGERY  2012   R  .  APPENDECTOMY    . BREAST SURGERY     REDUCTION MAMMOPLASTY  . CESAREAN SECTION     twice  . COSMETIC SURGERY    . FACELIFT     s/p  . TONSILLECTOMY  1974  . Upper nl TSH w/sx    . VAGINAL HYSTERECTOMY  1991   TVH, irregular bleeding   Social History   Socioeconomic History  . Marital status: Married    Spouse name: Not on file  . Number of children: 2  . Years of education: Not on file  . Highest education level: Not on file  Occupational History  . Occupation: Retired    Fish farm manager: SELF EMPLOYED  Tobacco Use  . Smoking status: Never Smoker  . Smokeless tobacco: Never Used  Vaping Use  . Vaping Use: Never used  Substance and Sexual Activity  . Alcohol use: Yes    Alcohol/week: 10.0 standard drinks    Types: 10 Glasses of wine per week  . Drug use: No  . Sexual activity: Yes    Birth control/protection: Post-menopausal, Surgical    Comment: HYST-1st intercourse 69 yo-Fewer than 5 partners  Other Topics Concern  . Not on file  Social History Narrative   Regular exercise   2 biol children-1 disabled with schizophrenia & 1 stepchild   Social Determinants of Health   Financial Resource Strain:   . Difficulty of Paying Living Expenses: Not on file  Food Insecurity:   .  Worried About Charity fundraiser in the Last Year: Not on file  . Ran Out of Food in the Last Year: Not on file  Transportation Needs:   . Lack of Transportation (Medical): Not on file  . Lack of Transportation (Non-Medical): Not on file  Physical Activity:   . Days of Exercise per Week: Not on file  . Minutes of Exercise per Session: Not on file  Stress:   . Feeling of Stress : Not on file  Social Connections:   . Frequency of Communication with Friends and Family: Not on file  . Frequency of Social Gatherings with Friends and Family: Not on file  . Attends Religious Services: Not on file  . Active Member of Clubs or Organizations: Not on file  . Attends Archivist Meetings: Not on  file  . Marital Status: Not on file   Allergies  Allergen Reactions  . Belviq [Lorcaserin Hcl]     palpitations  . Demerol [Meperidine]     palpitations  . Elavil [Amitriptyline Hcl]     constipation  . Latex Hives, Itching and Rash    Allergy  To latex, hives , redness, itching ,rash  . Medical Adhesive Remover Hives, Itching and Rash   Family History  Problem Relation Age of Onset  . Heart disease Father        CAD/CABG in his 74's  . Hypertension Mother   . Heart failure Mother     Current Outpatient Medications (Endocrine & Metabolic):  .  levothyroxine (SYNTHROID) 125 MCG tablet, Take 1 tablet (125 mcg total) by mouth daily.   Current Outpatient Medications (Respiratory):  .  benzonatate (TESSALON PERLES) 100 MG capsule, Take 1-2 capsules (100-200 mg total) by mouth 3 (three) times daily as needed. .  fluticasone (FLONASE) 50 MCG/ACT nasal spray, Place 2 sprays into both nostrils daily.  Current Outpatient Medications (Analgesics):  .  colchicine 0.6 MG tablet, Take 1 tablet (0.6 mg total) by mouth daily.   Current Outpatient Medications (Other):  .  cephALEXin (KEFLEX) 500 MG capsule, 1 po qd prn for UTI prophylaxis .  Cholecalciferol (VITAMIN D3) 2000 units capsule, Take 1 capsule (2,000 Units total) by mouth daily. .  citalopram (CELEXA) 20 MG tablet, Take 1 tablet (20 mg total) by mouth daily. Annual appt is due must see provider for future refills .  CRANBERRY EXTRACT PO, Take by mouth. .  Estradiol 10 MCG TABS vaginal tablet, Place 1 tablet (10 mcg total) vaginally 2 (two) times a week. .  gabapentin (NEURONTIN) 100 MG capsule, Take 1 capsule (100 mg total) by mouth 2 (two) times daily. [name]  gabapentin (NEURONTIN) 300 MG capsule, Take 1 capsule (300 mg total) by mouth 3 (three) times daily. .  methocarbamol (ROBAXIN) 500 MG tablet, Take 1 tablet (500 mg total) by mouth daily as needed for muscle spasms. .  Multiple Vitamin (MULTIVITAMIN) tablet, Take 1 tablet by  mouth daily. Life's Abundance .  NON FORMULARY, Allerfex  1 by mouth once daily as needed .  NON FORMULARY,  .  pantoprazole (PROTONIX) 20 MG tablet, Take 1 tablet (20 mg total) by mouth daily. .  traZODone (DESYREL) 50 MG tablet, TAKE 1/2 TO 1 (ONE-HALF TO ONE) TABLET BY MOUTH AT BEDTIME AS NEEDED FOR SLEEP .  Valerian Root 100 MG CAPS, Take 1 capsule by mouth daily.   [name]  zinc gluconate 50 MG tablet, Take 50 mg by mouth daily. [name]  Zoster Vaccine Adjuvanted [facility]) injection,  Reviewed prior external information including notes and imaging from  primary care provider As well as notes that were available from care everywhere and other healthcare systems.  Past medical history, social, surgical and family history all reviewed in electronic medical record.  No pertanent information unless stated regarding to the chief complaint.   Review of Systems:  No headache, visual changes, nausea, vomiting, diarrhea, constipation, dizziness, abdominal pain, skin rash, fevers, chills, night sweats, weight loss, swollen lymph nodes, body aches, joint swelling, chest pain, shortness of breath, mood changes. POSITIVE muscle aches  Objective  Blood pressure 130/70, pulse 70, height 5\' 2"  (1.575 m), weight 146 lb (66.2 kg), SpO2 98 %.   General: No apparent distress alert and oriented x3 mood and affect normal, dressed appropriately.  HEENT: Pupils equal, extraocular movements intact  Respiratory: Patient's speak in full sentences and does not appear short of breath  Cardiovascular: No lower extremity edema, non tender, no erythema   Gait antalgic Right hip exam does have significant decrease in range of motion especially with internal internal range of motion.  Patient has a tightness with straight leg test but no radicular symptoms at the moment.  All the pain seems to be on the medial aspect of the hip in the groin area.  Not a significant amount of pain no with straight leg in resisted adduction.     Impression and Recommendations:     The above documentation has been reviewed and is accurate and complete Lyndal Pulley, DO

## 2019-10-11 NOTE — Assessment & Plan Note (Signed)
Patient's MRI does show that there is significant arthritic changes of the hip with a labral pathology.  Secondary to patient's age and wants to be as active as she can I do feel that actually hip replacement would be her most beneficial aspect.  We discussed the potential of injections but I think it would be low likelihood.  Patient has been referred to orthopedic surgery and I do think she could be a candidate for even the anterior approach.  Patient understands this.  Gabapentin she has for the back pain and leg pain as well as Robaxin.  Trazodone for nighttime relief.  I am here if patient has any questions.  Total time with patient today 32 minutes.

## 2019-10-11 NOTE — Patient Instructions (Signed)
Hold on injection See me when you need me

## 2019-10-12 ENCOUNTER — Ambulatory Visit: Payer: Medicare Other | Admitting: Family Medicine

## 2019-10-14 ENCOUNTER — Encounter (HOSPITAL_COMMUNITY)
Admission: RE | Admit: 2019-10-14 | Discharge: 2019-10-14 | Disposition: A | Discharge status: Home / Self Care | Payer: Medicare Other | Source: Ambulatory Visit | Attending: Gastroenterology | Admitting: Gastroenterology

## 2019-10-14 ENCOUNTER — Other Ambulatory Visit: Payer: Self-pay

## 2019-10-14 DIAGNOSIS — R1011 Right upper quadrant pain: Secondary | ICD-10-CM

## 2019-10-14 DIAGNOSIS — R109 Unspecified abdominal pain: Secondary | ICD-10-CM | POA: Diagnosis not present

## 2019-10-14 DIAGNOSIS — R140 Abdominal distension (gaseous): Secondary | ICD-10-CM | POA: Diagnosis not present

## 2019-10-14 DIAGNOSIS — R112 Nausea with vomiting, unspecified: Secondary | ICD-10-CM | POA: Diagnosis not present

## 2019-10-14 MED ORDER — TECHNETIUM TC 99M MEBROFENIN IV KIT
5.0000 | PACK | Freq: Once | INTRAVENOUS | Status: AC | PRN
  Administered 2019-10-14: 5 via INTRAVENOUS

## 2019-10-14 NOTE — Progress Notes (Signed)
Called to nuclear medicine to assess pt. Pt was pale and feeling faint after injection. Checked pt vital signs, they are stable at this time. After sitting with patient and providing reassurance, pt states that she is feeling better. Pt is awake, alert and oriented x4. Her color has returned and she is not feeling faint upon my exit. Asked nuc med techs to please call if further assistance is needed, they acknowledge.

## 2019-10-15 DIAGNOSIS — M1611 Unilateral primary osteoarthritis, right hip: Secondary | ICD-10-CM | POA: Diagnosis not present

## 2019-10-26 ENCOUNTER — Other Ambulatory Visit: Payer: Medicare Other

## 2019-11-08 NOTE — Progress Notes (Signed)
[place] Phone: [phone] Subjective:   [name], am serving as a scribe for Dr. Hulan Saas. This visit occurred during the SARS-CoV-2 public health emergency.  Safety protocols were in place, including screening questions prior to the visit, additional usage of staff PPE, and extensive cleaning of exam room while observing appropriate contact time as indicated for disinfecting solutions.  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  Secondary to patient's age and wants to be as active as she can I do feel that actually hip replacement would be her most beneficial aspect.  We discussed the potential of injections but I think it would be low likelihood.  Patient has been referred to orthopedic surgery and I do think she could be a candidate for even the anterior approach.  Patient understands this.  Gabapentin she has for the back pain and leg pain as well as Robaxin.  Trazodone for nighttime relief.  I am here if patient has any questions.  Total time with patient today 32 minutes.   Update 11/09/2019 Adley [address] is a 69 y.o. female coming in with complaint of right hip pain. Patient states that she met with Dr. Rhona Raider and will have surgery on December 2nd. Has endoscopy and colonoscopy on Nov 29th. Will this be ok?  Patient would like to do some home physical therapy after surgery.   Should she continue gabapentin and trazadone up until surgery?        Past Medical History:  Diagnosis Date  . ALLERGIC RHINITIS   . ANXIETY   . GERD   . INSOMNIA-SLEEP DISORDER-UNSPEC   . Osteopenia 01/2016   T score -1.2 FRAX 15%/0.9%  . Shingles   . Thyroid disease    Past Surgical History:  Procedure Laterality Date  .  ANKLE FRACTURE SURGERY  2012   R  . APPENDECTOMY    . BREAST SURGERY     REDUCTION MAMMOPLASTY  . CESAREAN SECTION     twice  . COSMETIC SURGERY    . FACELIFT     s/p  . TONSILLECTOMY  1974  . Upper nl TSH w/sx    . VAGINAL HYSTERECTOMY  1991   TVH, irregular bleeding   Social History   Socioeconomic History  . Marital status: Married    Spouse name: Not on file  . Number of children: 2  . Years of education: Not on file  . Highest education level: Not on file  Occupational History  . Occupation: Retired    Fish farm manager: SELF EMPLOYED  Tobacco Use  . Smoking status: Never Smoker  . Smokeless tobacco: Never Used  Vaping Use  . Vaping Use: Never used  Substance and Sexual Activity  . Alcohol use: Yes    Alcohol/week: 10.0 standard drinks    Types: 10 Glasses of wine per week  . Drug use: No  . Sexual activity: Yes    Birth control/protection: Post-menopausal, Surgical    Comment: HYST-1st intercourse 69 yo-Fewer than 5 partners  Other Topics Concern  . Not on file  Social History Narrative   Regular exercise   2 biol children-1 disabled with schizophrenia & 1 stepchild   Social Determinants of Health   Financial Resource Strain:   . Difficulty  of Paying Living Expenses: Not on file  Food Insecurity:   . Worried About Charity fundraiser in the Last Year: Not on file  . Ran Out of Food in the Last Year: Not on file  Transportation Needs:   . Lack of Transportation (Medical): Not on file  . Lack of Transportation (Non-Medical): Not on file  Physical Activity:   . Days of Exercise per Week: Not on file  . Minutes of Exercise per Session: Not on file  Stress:   . Feeling of Stress : Not on file  Social Connections:   . Frequency of Communication with Friends and Family: Not on file  . Frequency of Social Gatherings with Friends and Family: Not on file  . Attends Religious Services: Not on file  . Active Member of Clubs or Organizations: Not on file  . Attends  Archivist Meetings: Not on file  . Marital Status: Not on file   Allergies  Allergen Reactions  . Belviq [Lorcaserin Hcl]     palpitations  . Demerol [Meperidine]     palpitations  . Elavil [Amitriptyline Hcl]     constipation  . Latex Hives, Itching and Rash    Allergy  To latex, hives , redness, itching ,rash  . Medical Adhesive Remover Hives, Itching and Rash   Family History  Problem Relation Age of Onset  . Heart disease Father        CAD/CABG in his 72's  . Hypertension Mother   . Heart failure Mother     Current Outpatient Medications (Endocrine & Metabolic):  .  levothyroxine (SYNTHROID) 125 MCG tablet, Take 1 tablet (125 mcg total) by mouth daily.   Current Outpatient Medications (Respiratory):  .  benzonatate (TESSALON PERLES) 100 MG capsule, Take 1-2 capsules (100-200 mg total) by mouth 3 (three) times daily as needed. .  fluticasone (FLONASE) 50 MCG/ACT nasal spray, Place 2 sprays into both nostrils daily.  Current Outpatient Medications (Analgesics):  .  colchicine 0.6 MG tablet, Take 1 tablet (0.6 mg total) by mouth daily.   Current Outpatient Medications (Other):  .  cephALEXin (KEFLEX) 500 MG capsule, 1 po qd prn for UTI prophylaxis .  Cholecalciferol (VITAMIN D3) 2000 units capsule, Take 1 capsule (2,000 Units total) by mouth daily. .  citalopram (CELEXA) 20 MG tablet, Take 1 tablet (20 mg total) by mouth daily. Annual appt is due must see provider for future refills .  CRANBERRY EXTRACT PO, Take by mouth. .  Estradiol 10 MCG TABS vaginal tablet, Place 1 tablet (10 mcg total) vaginally 2 (two) times a week. .  gabapentin (NEURONTIN) 100 MG capsule, Take 1 capsule (100 mg total) by mouth 2 (two) times daily. [name]  gabapentin (NEURONTIN) 300 MG capsule, Take 1 capsule (300 mg total) by mouth 3 (three) times daily. .  methocarbamol (ROBAXIN) 500 MG tablet, Take 1 tablet (500 mg total) by mouth daily as needed for muscle spasms. .  Multiple Vitamin  (MULTIVITAMIN) tablet, Take 1 tablet by mouth daily. Life's Abundance .  NON FORMULARY, Allerfex  1 by mouth once daily as needed .  NON FORMULARY,  .  pantoprazole (PROTONIX) 20 MG tablet, Take 1 tablet (20 mg total) by mouth daily. .  traZODone (DESYREL) 50 MG tablet, TAKE 1/2 TO 1 (ONE-HALF TO ONE) TABLET BY MOUTH AT BEDTIME AS NEEDED FOR SLEEP .  Valerian Root 100 MG CAPS, Take 1 capsule by mouth daily.   [name]  zinc gluconate 50 MG tablet, Take  50 mg by mouth daily. [name]  Zoster Vaccine Adjuvanted [facility]) injection,    Reviewed prior external information including notes and imaging from  primary care provider As well as notes that were available from care everywhere and other healthcare systems.  Past medical history, social, surgical and family history all reviewed in electronic medical record.  No pertanent information unless stated regarding to the chief complaint.   Review of Systems:  No headache, visual changes, nausea, vomiting, diarrhea, constipation, dizziness, abdominal pain, skin rash, fevers, chills, night sweats, weight loss, swollen lymph nodes, body aches, joint swelling, chest pain, shortness of breath, mood changes. POSITIVE muscle aches  Objective  Blood pressure 104/72, pulse 78, height 5' 2" (1.575 m), weight 147 lb (66.7 kg), SpO2 95 %.   General: No apparent distress alert and oriented x3 mood and affect normal, dressed appropriately.  HEENT: Pupils equal, extraocular movements intact  Respiratory: Patient's speak in full sentences and does not appear short of breath  Cardiovascular: No lower extremity edema, non tender, no erythema  Deferred physical exam today.    Impression and Recommendations:     The above documentation has been reviewed and is accurate and complete Lyndal Pulley, DO

## 2019-11-09 ENCOUNTER — Ambulatory Visit: Payer: Medicare Other

## 2019-11-09 ENCOUNTER — Other Ambulatory Visit: Payer: Self-pay

## 2019-11-09 ENCOUNTER — Encounter: Payer: Self-pay | Admitting: Family Medicine

## 2019-11-09 ENCOUNTER — Ambulatory Visit: Payer: Medicare Other | Admitting: Family Medicine

## 2019-11-09 DIAGNOSIS — M1611 Unilateral primary osteoarthritis, right hip: Secondary | ICD-10-CM

## 2019-11-09 NOTE — Assessment & Plan Note (Signed)
Discussed with patient as well as spouse.  We discussed with her that she is in good hands.  Patient will likely get a preop call to discuss her medications beforehand.  I believe that patient should do well with the anesthesia from the colonoscopy 48 hours before the surgery and doing well actively well with this.  Should do well with a outpatient discussed icing regimen and home exercises otherwise.  Patient can call for questions but otherwise will follow up with me as needed

## 2019-11-17 DIAGNOSIS — Z01812 Encounter for preprocedural laboratory examination: Secondary | ICD-10-CM | POA: Diagnosis not present

## 2019-11-17 DIAGNOSIS — M1611 Unilateral primary osteoarthritis, right hip: Secondary | ICD-10-CM | POA: Diagnosis not present

## 2019-11-21 ENCOUNTER — Other Ambulatory Visit: Payer: Self-pay

## 2019-11-21 ENCOUNTER — Ambulatory Visit (INDEPENDENT_AMBULATORY_CARE_PROVIDER_SITE_OTHER): Payer: Medicare Other | Admitting: Internal Medicine

## 2019-11-21 ENCOUNTER — Encounter: Payer: Self-pay | Admitting: Internal Medicine

## 2019-11-21 DIAGNOSIS — E034 Atrophy of thyroid (acquired): Secondary | ICD-10-CM | POA: Diagnosis not present

## 2019-11-21 DIAGNOSIS — I10 Essential (primary) hypertension: Secondary | ICD-10-CM | POA: Diagnosis not present

## 2019-11-21 DIAGNOSIS — Z01818 Encounter for other preprocedural examination: Secondary | ICD-10-CM | POA: Diagnosis not present

## 2019-11-21 DIAGNOSIS — K219 Gastro-esophageal reflux disease without esophagitis: Secondary | ICD-10-CM

## 2019-11-21 LAB — URINALYSIS
Bilirubin Urine: NEGATIVE
Hgb urine dipstick: NEGATIVE
Ketones, ur: NEGATIVE
Leukocytes,Ua: NEGATIVE
Nitrite: NEGATIVE
Specific Gravity, Urine: 1.015 (ref 1.000–1.030)
Total Protein, Urine: NEGATIVE
Urine Glucose: NEGATIVE
Urobilinogen, UA: 0.2 (ref 0.0–1.0)
pH: 8 (ref 5.0–8.0)

## 2019-11-21 NOTE — Assessment & Plan Note (Signed)
BP Readings from Last 3 Encounters:  11/21/19 120/82  11/09/19 104/72  10/14/19 114/74

## 2019-11-21 NOTE — Assessment & Plan Note (Signed)
Ellen [name] is medically clear for her R hip surgery.  Labs were done at [facility]. EKG - WNL. Check UA Thank you

## 2019-11-21 NOTE — Assessment & Plan Note (Signed)
Continue Protonix °

## 2019-11-21 NOTE — Assessment & Plan Note (Signed)
On Levothroid 

## 2019-11-21 NOTE — Progress Notes (Signed)
Subjective:  Patient ID: Ellen [name], female    DOB: [birth year]  Age: 69 y.o. 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 She has HTN, GERD, Dyslipidemia - all stable. Cardiac CT scan for calcium score is 6 in  2/21.   Past Medical History:  Diagnosis Date  . ALLERGIC RHINITIS   . ANXIETY   . GERD   . INSOMNIA-SLEEP DISORDER-UNSPEC   . Osteopenia 01/2016   T score -1.2 FRAX 15%/0.9%  . Shingles   . Thyroid disease    Past Surgical History:  Procedure Laterality Date  . ANKLE FRACTURE SURGERY  2012   R  . APPENDECTOMY    . BREAST SURGERY     REDUCTION MAMMOPLASTY  . CESAREAN SECTION     twice  . COSMETIC SURGERY    . FACELIFT     s/p  . TONSILLECTOMY  1974  . Upper nl TSH w/sx    . VAGINAL HYSTERECTOMY  1991   TVH, irregular bleeding    reports that she has never smoked. She has never used smokeless tobacco. She reports current alcohol use of about 10.0 standard drinks of alcohol per week. She reports that she does not use drugs. family history includes Heart disease in her father; Heart failure in her mother; Hypertension in her mother. Allergies  Allergen Reactions  . Belviq [Lorcaserin Hcl]     palpitations  . Demerol [Meperidine]     palpitations  . Elavil [Amitriptyline Hcl]     constipation  . Other Other (See Comments)    Other  . Latex Hives, Itching and Rash    Allergy  To latex, hives , redness, itching ,rash  . Medical Adhesive Remover Hives, Itching and Rash     Outpatient Medications Prior to Visit  Medication Sig Dispense Refill  . benzonatate (TESSALON PERLES) 100 MG capsule Take 1-2 capsules (100-200 mg total) by mouth 3 (three) times daily as needed. 60 capsule 0  . Cholecalciferol (VITAMIN D3) 2000 units capsule Take 1 capsule (2,000 Units total) by mouth daily. 100  capsule 3  . citalopram (CELEXA) 20 MG tablet Take 1 tablet (20 mg total) by mouth daily. Annual appt is due must see provider for future refills 90 tablet 3  . CRANBERRY EXTRACT PO Take by mouth.    . Estradiol 10 MCG TABS vaginal tablet Place 1 tablet (10 mcg total) vaginally 2 (two) times a week. 8 tablet 9  . fluticasone (FLONASE) 50 MCG/ACT nasal spray Place 2 sprays into both nostrils daily. 11.1 g 2  . gabapentin (NEURONTIN) 100 MG capsule Take 1 capsule (100 mg total) by mouth 2 (two) times daily. 180 capsule 3  . gabapentin (NEURONTIN) 300 MG capsule Take 1 capsule (300 mg total) by mouth 3 (three) times daily. 90 capsule 3  . levothyroxine (SYNTHROID) 125 MCG tablet Take 1 tablet (125 mcg total) by mouth daily. 90 tablet 3  . methocarbamol (ROBAXIN) 500 MG tablet Take 1 tablet (500 mg total) by mouth daily as needed for muscle spasms. 30 tablet 5  . Multiple Vitamin (MULTIVITAMIN) tablet Take 1 tablet by mouth daily. Life's Abundance    . NON FORMULARY Allerfex  1 by mouth once daily as needed    . NON FORMULARY     . pantoprazole (PROTONIX) 20 MG  tablet Take 1 tablet (20 mg total) by mouth daily. 90 tablet 3  . traZODone (DESYREL) 50 MG tablet TAKE 1/2 TO 1 (ONE-HALF TO ONE) TABLET BY MOUTH AT BEDTIME AS NEEDED FOR SLEEP 90 tablet 1  . Valerian Root 100 MG CAPS Take 1 capsule by mouth daily.      [name] zinc gluconate 50 MG tablet Take 50 mg by mouth daily.    . cephALEXin (KEFLEX) 500 MG capsule 1 po qd prn for UTI prophylaxis (Patient not taking: Reported on 11/21/2019) 20 capsule 1  . colchicine 0.6 MG tablet Take 1 tablet (0.6 mg total) by mouth daily. 30 tablet 0  . Zoster Vaccine Adjuvanted [facility]) injection      No facility-administered medications prior to visit.    ROS: Review of Systems  Constitutional: Negative for activity change, appetite change, chills, fatigue and unexpected weight change.  HENT: Negative for congestion, mouth sores and sinus pressure.   Eyes:  Negative for visual disturbance.  Respiratory: Negative for cough, chest tightness and shortness of breath.   Cardiovascular: Negative for chest pain, palpitations and leg swelling.  Gastrointestinal: Negative for abdominal pain and nausea.  Genitourinary: Negative for difficulty urinating, frequency and vaginal pain.  Musculoskeletal: Positive for arthralgias and gait problem. Negative for back pain.  Skin: Negative for pallor and rash.  Neurological: Negative for dizziness, tremors, weakness, numbness and headaches.  Psychiatric/Behavioral: Positive for decreased concentration. Negative for confusion and sleep disturbance.    Objective:  BP 120/82 (BP Location: Left Arm)   Pulse 71   Temp 98.7 F (37.1 C) (Oral)   Wt 145 lb 6.4 oz (66 kg)   SpO2 97%   BMI 26.59 kg/m   BP Readings from Last 3 Encounters:  11/21/19 120/82  11/09/19 104/72  10/14/19 114/74    Wt Readings from Last 3 Encounters:  11/21/19 145 lb 6.4 oz (66 kg)  11/09/19 147 lb (66.7 kg)  10/11/19 146 lb (66.2 kg)    Physical Exam Constitutional:      General: She is not in acute distress.    Appearance: She is well-developed.  HENT:     Head: Normocephalic.     Right Ear: External ear normal.     Left Ear: External ear normal.     Nose: Nose normal.  Eyes:     General:        Right eye: No discharge.        Left eye: No discharge.     Conjunctiva/sclera: Conjunctivae normal.     Pupils: Pupils are equal, round, and reactive to light.  Neck:     Thyroid: No thyromegaly.     Vascular: No JVD.     Trachea: No tracheal deviation.  Cardiovascular:     Rate and Rhythm: Normal rate and regular rhythm.     Heart sounds: Normal heart sounds.  Pulmonary:     Effort: No respiratory distress.     Breath sounds: No stridor. No wheezing.  Abdominal:     General: Bowel sounds are normal. There is no distension.     Palpations: Abdomen is soft. There is no mass.     Tenderness: There is no abdominal  tenderness. There is no guarding or rebound.  Musculoskeletal:        General: Tenderness present.     Cervical back: Normal range of motion and neck supple.  Lymphadenopathy:     Cervical: No cervical adenopathy.  Skin:    Findings: No erythema or rash.  Neurological:     Cranial Nerves: No cranial nerve deficit.     Motor: No abnormal muscle tone.     Coordination: Coordination normal.     Deep Tendon Reflexes: Reflexes normal.  Psychiatric:        Behavior: Behavior normal.        Thought Content: Thought content normal.        Judgment: Judgment normal.     R hip w/pain; very stiff, moving slow  Procedure: EKG Indication: chest pain Impression: NSR. No acute changes.   Lab Results  Component Value Date   WBC 5.8 05/09/2019   HGB 14.0 05/09/2019   HCT 41.7 05/09/2019   PLT 215.0 05/09/2019   GLUCOSE 92 09/26/2019   CHOL 243 (H) 09/26/2019   TRIG 160 (H) 09/26/2019   HDL 74 09/26/2019   LDLDIRECT 142.6 07/09/2011   LDLCALC 139 (H) 09/26/2019   ALT 14 09/26/2019   AST 20 09/26/2019   NA 139 09/26/2019   K 4.4 09/26/2019   CL 101 09/26/2019   CREATININE 0.72 09/26/2019   BUN 15 09/26/2019   CO2 25 09/26/2019   TSH 1.31 09/26/2019   HGBA1C 5.6 10/12/2006    NM Hepato W/EF  Result Date: 10/14/2019 CLINICAL DATA:  RIGHT-side abdominal pain, distention, nausea, and vomiting for several months EXAM: NUCLEAR MEDICINE HEPATOBILIARY IMAGING WITH GALLBLADDER EF TECHNIQUE: Sequential images of the abdomen were obtained out to 60 minutes following intravenous administration of radiopharmaceutical. After oral ingestion of Ensure, gallbladder ejection fraction was determined. At 60 min, normal ejection fraction is greater than 33%. RADIOPHARMACEUTICALS:  5.15 mCi Tc-61m  Choletec IV COMPARISON:  None FINDINGS: Normal tracer extraction from bloodstream indicating normal hepatocellular function. Normal excretion of tracer into biliary tree. Gallbladder visualized by 5 minutes.  Small bowel not visualized until following fatty meal stimulation. No hepatic retention of tracer. Subjectively normal emptying of tracer from gallbladder following fatty meal stimulation. Calculated gallbladder ejection fraction is 60%, normal. Patient reported no symptoms following Ensure ingestion. Normal gallbladder ejection fraction following Ensure ingestion is greater than 33% at 1 hour. IMPRESSION: Normal exam. Electronically Signed   By: Lavonia Dana M.D.   On: 10/14/2019 17:02    Assessment & Plan:    Walker Kehr, MD

## 2019-11-25 NOTE — Telephone Encounter (Signed)
Faxed over notes to [facility] at [facility]

## 2019-11-28 DIAGNOSIS — M1611 Unilateral primary osteoarthritis, right hip: Secondary | ICD-10-CM | POA: Diagnosis not present

## 2019-12-08 DIAGNOSIS — M1611 Unilateral primary osteoarthritis, right hip: Secondary | ICD-10-CM | POA: Diagnosis not present

## 2019-12-08 HISTORY — PX: HIP SURGERY: SHX245

## 2019-12-09 ENCOUNTER — Other Ambulatory Visit: Payer: Self-pay

## 2019-12-09 ENCOUNTER — Emergency Department (HOSPITAL_COMMUNITY): Payer: Medicare Other

## 2019-12-09 ENCOUNTER — Encounter (HOSPITAL_COMMUNITY): Payer: Self-pay

## 2019-12-09 ENCOUNTER — Emergency Department (HOSPITAL_COMMUNITY)
Admission: EM | Admit: 2019-12-09 | Discharge: 2019-12-09 | Disposition: A | Discharge status: Home / Self Care | Payer: Medicare Other | Attending: Emergency Medicine | Admitting: Emergency Medicine

## 2019-12-09 DIAGNOSIS — S7011XA Contusion of right thigh, initial encounter: Secondary | ICD-10-CM

## 2019-12-09 DIAGNOSIS — Z79899 Other long term (current) drug therapy: Secondary | ICD-10-CM | POA: Diagnosis not present

## 2019-12-09 DIAGNOSIS — Z85828 Personal history of other malignant neoplasm of skin: Secondary | ICD-10-CM | POA: Insufficient documentation

## 2019-12-09 DIAGNOSIS — Z743 Need for continuous supervision: Secondary | ICD-10-CM | POA: Diagnosis not present

## 2019-12-09 DIAGNOSIS — R6889 Other general symptoms and signs: Secondary | ICD-10-CM | POA: Diagnosis not present

## 2019-12-09 DIAGNOSIS — R404 Transient alteration of awareness: Secondary | ICD-10-CM | POA: Diagnosis not present

## 2019-12-09 DIAGNOSIS — R0902 Hypoxemia: Secondary | ICD-10-CM | POA: Diagnosis not present

## 2019-12-09 DIAGNOSIS — Y92002 Bathroom of unspecified non-institutional (private) residence single-family (private) house as the place of occurrence of the external cause: Secondary | ICD-10-CM | POA: Insufficient documentation

## 2019-12-09 DIAGNOSIS — R339 Retention of urine, unspecified: Secondary | ICD-10-CM

## 2019-12-09 DIAGNOSIS — W19XXXA Unspecified fall, initial encounter: Secondary | ICD-10-CM | POA: Insufficient documentation

## 2019-12-09 DIAGNOSIS — D62 Acute posthemorrhagic anemia: Secondary | ICD-10-CM | POA: Diagnosis not present

## 2019-12-09 DIAGNOSIS — I10 Essential (primary) hypertension: Secondary | ICD-10-CM | POA: Insufficient documentation

## 2019-12-09 DIAGNOSIS — R55 Syncope and collapse: Secondary | ICD-10-CM

## 2019-12-09 DIAGNOSIS — R52 Pain, unspecified: Secondary | ICD-10-CM | POA: Diagnosis not present

## 2019-12-09 DIAGNOSIS — Z96641 Presence of right artificial hip joint: Secondary | ICD-10-CM | POA: Insufficient documentation

## 2019-12-09 DIAGNOSIS — E039 Hypothyroidism, unspecified: Secondary | ICD-10-CM | POA: Insufficient documentation

## 2019-12-09 DIAGNOSIS — Z91040 Latex allergy status: Secondary | ICD-10-CM | POA: Insufficient documentation

## 2019-12-09 DIAGNOSIS — M25551 Pain in right hip: Secondary | ICD-10-CM | POA: Diagnosis not present

## 2019-12-09 DIAGNOSIS — S79921A Unspecified injury of right thigh, initial encounter: Secondary | ICD-10-CM | POA: Diagnosis present

## 2019-12-09 LAB — CBC WITH DIFFERENTIAL/PLATELET
Abs Immature Granulocytes: 0.03 10*3/uL (ref 0.00–0.07)
Basophils Absolute: 0 10*3/uL (ref 0.0–0.1)
Basophils Relative: 0 %
Eosinophils Absolute: 0 10*3/uL (ref 0.0–0.5)
Eosinophils Relative: 0 %
HCT: 26 % — ABNORMAL LOW (ref 36.0–46.0)
Hemoglobin: 8.6 g/dL — ABNORMAL LOW (ref 12.0–15.0)
Immature Granulocytes: 1 %
Lymphocytes Relative: 16 %
Lymphs Abs: 0.9 10*3/uL (ref 0.7–4.0)
MCH: 32 pg (ref 26.0–34.0)
MCHC: 33.1 g/dL (ref 30.0–36.0)
MCV: 96.7 fL (ref 80.0–100.0)
Monocytes Absolute: 0.7 10*3/uL (ref 0.1–1.0)
Monocytes Relative: 13 %
Neutro Abs: 3.9 10*3/uL (ref 1.7–7.7)
Neutrophils Relative %: 70 %
Platelets: 140 10*3/uL — ABNORMAL LOW (ref 150–400)
RBC: 2.69 MIL/uL — ABNORMAL LOW (ref 3.87–5.11)
RDW: 13 % (ref 11.5–15.5)
WBC: 5.5 10*3/uL (ref 4.0–10.5)
nRBC: 0 % (ref 0.0–0.2)

## 2019-12-09 LAB — BASIC METABOLIC PANEL
Anion gap: 6 (ref 5–15)
BUN: 10 mg/dL (ref 8–23)
CO2: 25 mmol/L (ref 22–32)
Calcium: 7.8 mg/dL — ABNORMAL LOW (ref 8.9–10.3)
Chloride: 103 mmol/L (ref 98–111)
Creatinine, Ser: 0.7 mg/dL (ref 0.44–1.00)
GFR, Estimated: >60 mL/min (ref >60)
Glucose, Bld: 127 mg/dL — ABNORMAL HIGH (ref 70–99)
Potassium: 3.5 mmol/L (ref 3.5–5.1)
Sodium: 134 mmol/L — ABNORMAL LOW (ref 135–145)

## 2019-12-09 LAB — URINALYSIS, ROUTINE W REFLEX MICROSCOPIC
Bilirubin Urine: NEGATIVE
Glucose, UA: NEGATIVE mg/dL
Hgb urine dipstick: NEGATIVE
Ketones, ur: NEGATIVE mg/dL
Leukocytes,Ua: NEGATIVE
Nitrite: NEGATIVE
Protein, ur: NEGATIVE mg/dL
Specific Gravity, Urine: 1.009 (ref 1.005–1.030)
pH: 6 (ref 5.0–8.0)

## 2019-12-09 IMAGING — CR DG HIP (WITH OR WITHOUT PELVIS) 2-3V*R*
3 series · 3 of 3 positions shown · non-contrast
Comparison: MRI [DATE].  Right hip series [DATE].

CLINICAL DATA: Fall.  Right hip pain.

EXAM:
DG HIP (WITH OR WITHOUT PELVIS) 2-3V RIGHT

[x pelvis]
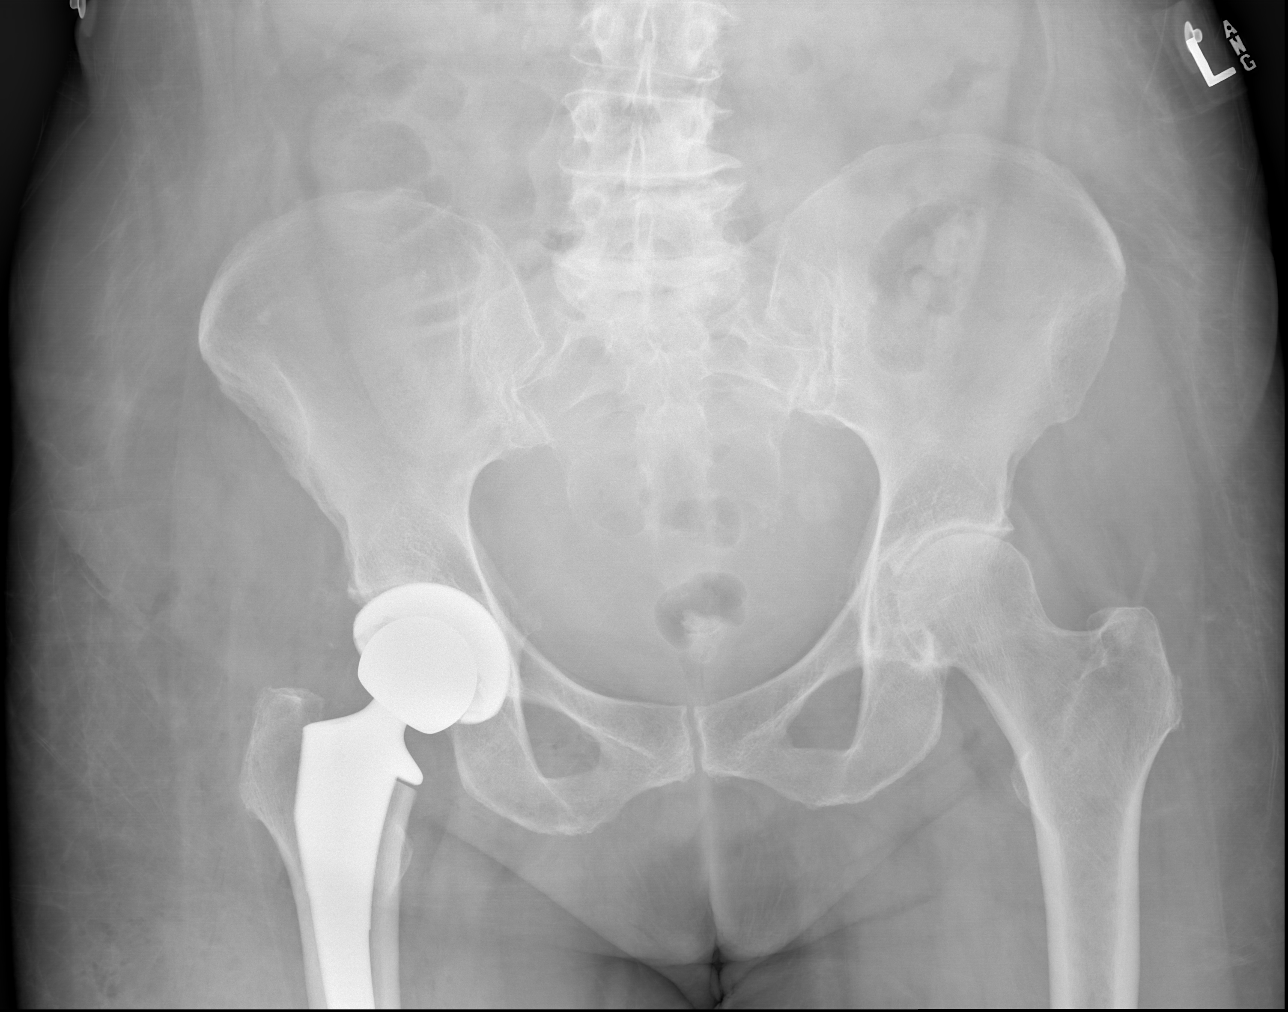

[x hip ap right]
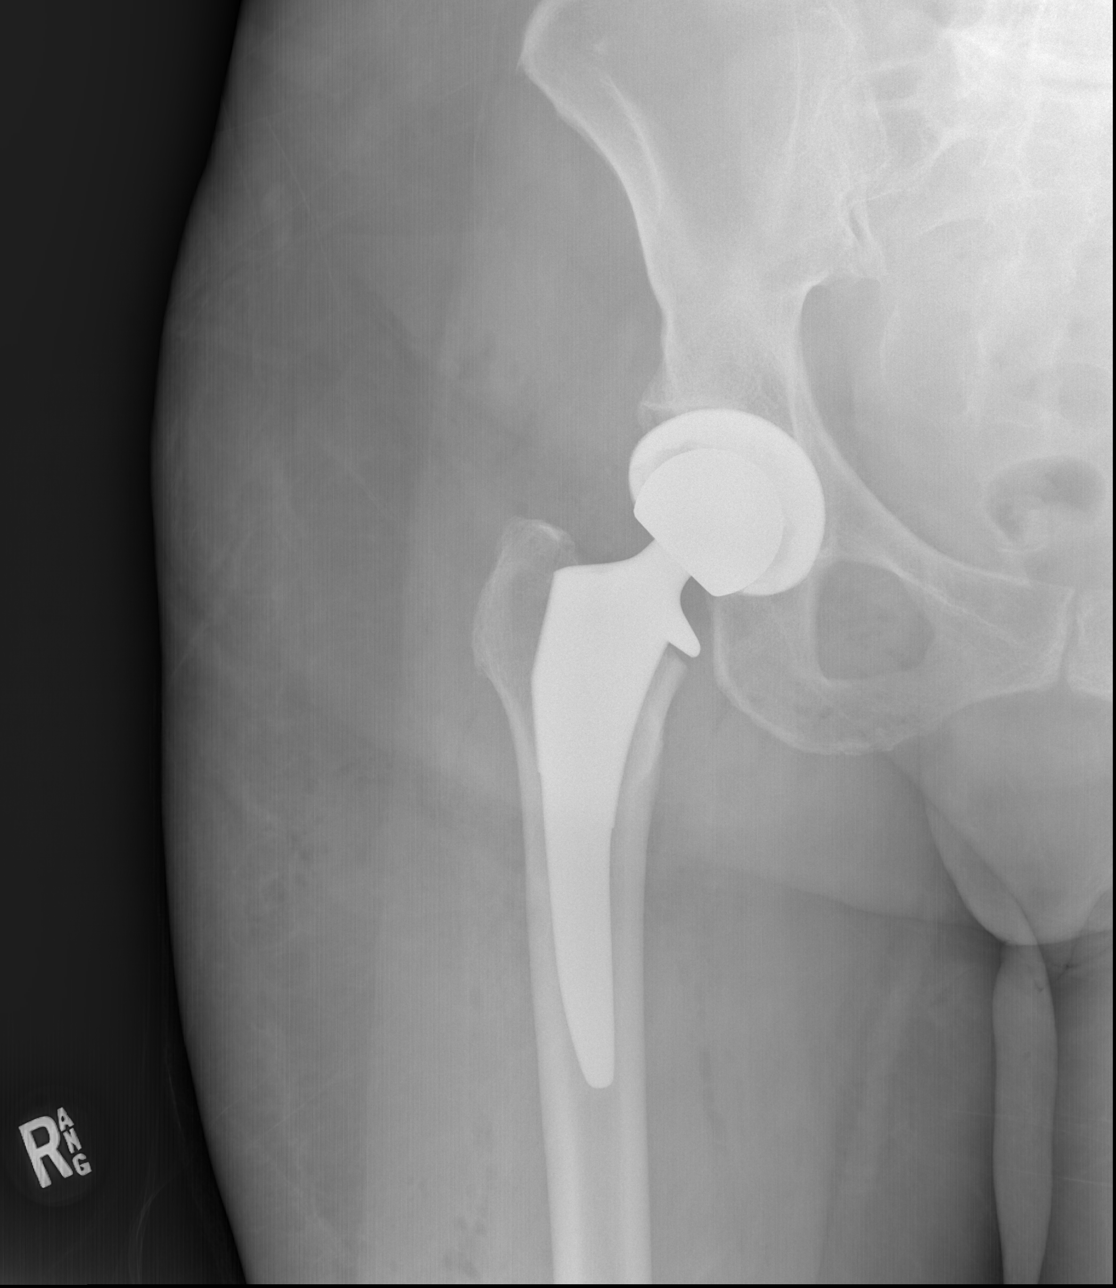

[w hip lat right]
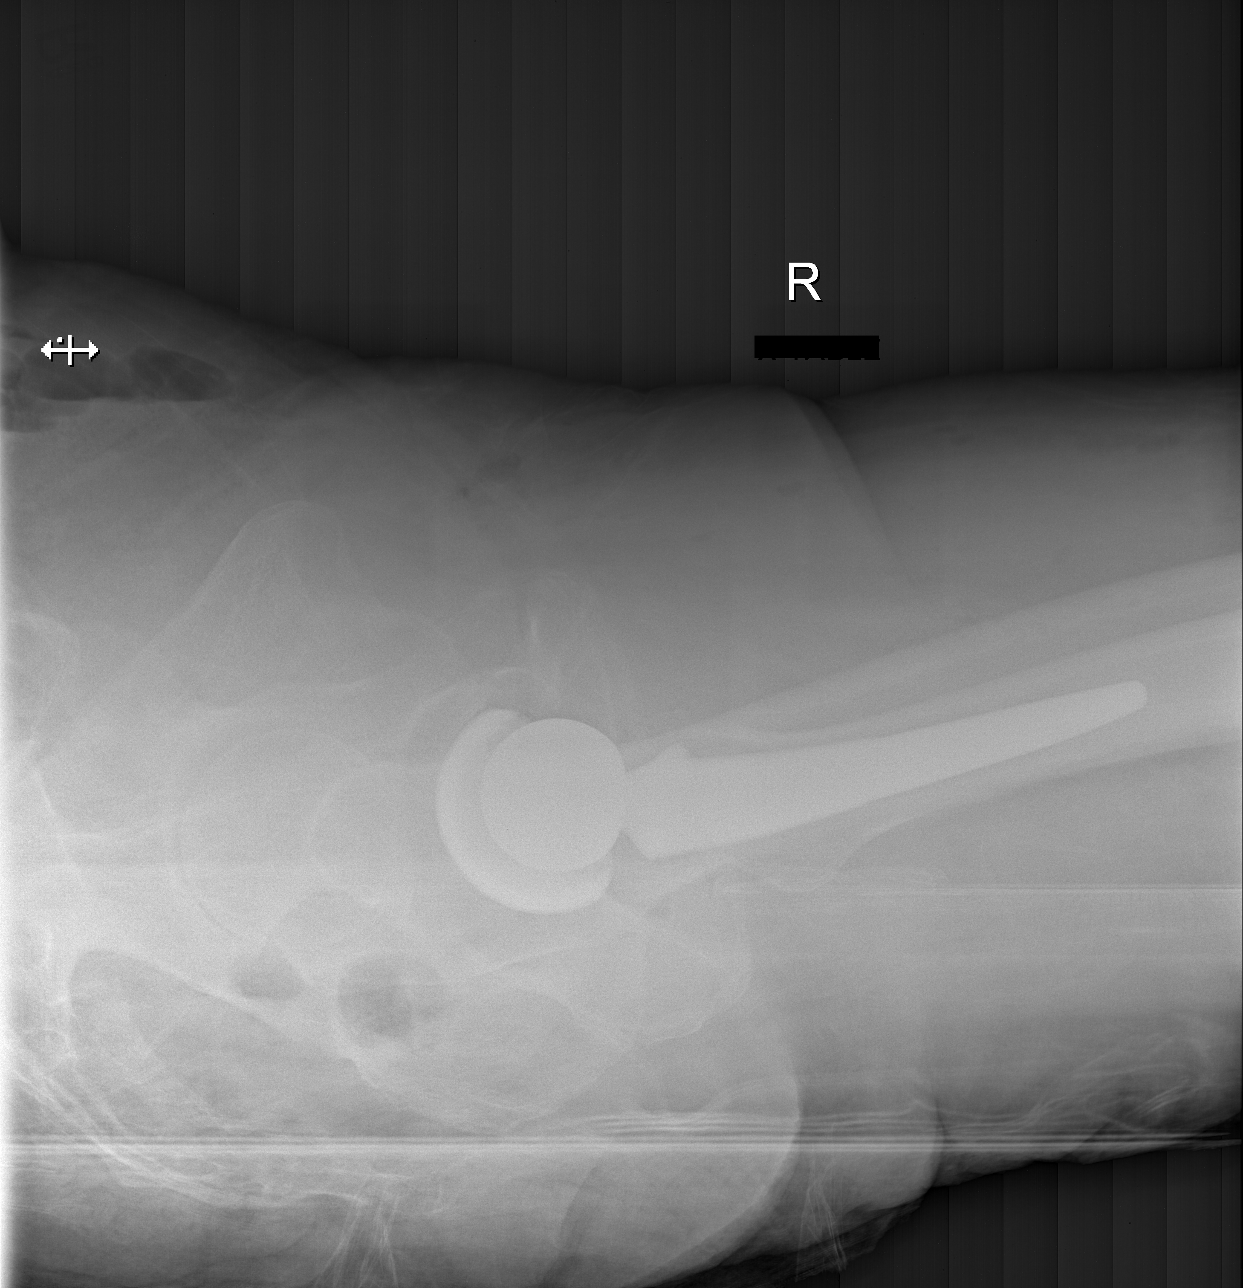

[3 of 3 positions shown; findings below may reference images not displayed]

FINDINGS: Total right hip replacement. Hardware intact. Anatomic alignment.
Degenerative changes lumbar spine and left hip. No acute abnormality
identified.
IMPRESSION: Total right hip replacement. Hardware intact. Anatomic alignment. No
acute abnormality.

## 2019-12-09 MED ORDER — ACETAMINOPHEN 500 MG PO TABS
1000.0000 mg | ORAL_TABLET | Freq: Once | ORAL | Status: AC
  Administered 2019-12-09: 1000 mg via ORAL
  Filled 2019-12-09: qty 2

## 2019-12-09 MED ORDER — MORPHINE SULFATE 15 MG PO TABS
7.5000 mg | ORAL_TABLET | ORAL | 0 refills | Status: DC | PRN
Start: 2019-12-09 — End: 2020-02-15

## 2019-12-09 MED ORDER — SODIUM CHLORIDE 0.9 % IV BOLUS
1000.0000 mL | Freq: Once | INTRAVENOUS | Status: AC
  Administered 2019-12-09: 1000 mL via INTRAVENOUS

## 2019-12-09 NOTE — ED Provider Notes (Signed)
[facility] DEPT Provider Note   CSN: [id number] Arrival date & time: 12/09/19  2585     History Chief Complaint  Patient presents with  . Fall    Ellen [name] is a 69 y.o. female.  69 yo F with a chief complaints of inability to urinate right hip pain and a fall. Patient states that she had her right hip replaced yesterday. Was discharged home later in the afternoon. Think she has not urinated in about 12 hours or so. She got up to use the bathroom this morning and felt too weak to make it to the bathroom and eventually collapsed to the ground. Denies any specific injury in the fall but felt like her right leg bent underneath her slightly. She denies head injury denies loss consciousness denies headache chest pain abdominal pain. She has never had difficulty urinating before but feels like she is unable. She thinks this could be due to the narcotic pain medicine which she took and escalating doses yesterday due to worsening pain.  The history is provided by the patient.  Fall This is a new problem. The current episode started less than 1 hour ago. The problem occurs constantly. The problem has not changed since onset.Pertinent negatives include no chest pain, no headaches and no shortness of breath. Nothing aggravates the symptoms. Nothing relieves the symptoms. She has tried nothing for the symptoms. The treatment provided no relief.       Past Medical History:  Diagnosis Date  . ALLERGIC RHINITIS   . ANXIETY   . GERD   . INSOMNIA-SLEEP DISORDER-UNSPEC   . Osteopenia 01/2016   T score -1.2 FRAX 15%/0.9%  . Shingles   . Thyroid disease     Patient Active Problem List   Diagnosis Date Noted  . Preop exam for internal medicine 11/21/2019  . Arthritis of right hip 09/29/2019  . Degenerative disc disease, lumbar 09/29/2019  . Agatston CAC score, <100 09/26/2019  . Acute medial meniscus tear, right, initial encounter 08/18/2019  . 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 Dry skin 05/24/2019  . UTI (urinary tract infection) 05/23/2019  . Nausea 05/09/2019  . Strain of muscle of right groin region 03/14/2019  . Pes anserinus bursitis of right knee 10/19/2018  . Morton's neuroma of left foot 06/08/2018  . Dermatitis 01/01/2017  . Acute MEE (middle ear effusion) 11/10/2016  . Rotator cuff syndrome, right 10/13/2016  . Bacterial sinusitis 08/20/2016  . Greater trochanteric bursitis of right hip 09/24/2015  . Cellulitis and abscess of leg 08/31/2015  . Eosinophilia 03/27/2015  . Neoplasm of uncertain behavior of skin 12/14/2014  . Cough 09/28/2014  . Conjunctivitis 02/13/2014  . Hypothyroidism 01/13/2014  . Dark stools 04/16/2012  . RUQ abdominal pain 04/16/2012  . Cold sore 11/18/2011  . Well adult exam 07/15/2011  . Closed right ankle fracture 07/15/2011  . Hypertension 07/15/2011  . Weight gain 09/18/2010  . TOBACCO USE, QUIT 10/19/2008  . Rash and other nonspecific skin eruption 10/19/2007  . OTITIS MEDIA, CHRONIC SEROUS 08/31/2007  . Dyslipidemia 07/28/2007  . GERD 04/13/2007  . SYNCOPE 04/13/2007  . Generalized anxiety disorder 10/16/2006  . Insomnia 10/16/2006  . Depression 10/16/2006  . ALLERGIC RHINITIS 10/16/2006  . 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 ANKLE FRACTURE SURGERY  2012   R  . APPENDECTOMY    . BREAST SURGERY     REDUCTION MAMMOPLASTY  . CESAREAN SECTION  twice  . COSMETIC SURGERY    . FACELIFT     s/p  . TONSILLECTOMY  1974  . Upper nl TSH w/sx    . VAGINAL HYSTERECTOMY  1991   TVH, irregular bleeding     OB History    Gravida  3   Para  2   Term      Preterm      AB  1   Living  2     SAB  1   TAB      Ectopic      Multiple      Live Births              Family History  Problem Relation Age of Onset  . Heart disease Father        CAD/CABG in his 78's  . Hypertension Mother   . Heart failure Mother      Social History   Tobacco Use  . Smoking status: Never Smoker  . Smokeless tobacco: Never Used  Vaping Use  . Vaping Use: Never used  Substance Use Topics  . Alcohol use: Yes    Alcohol/week: 10.0 standard drinks    Types: 10 Glasses of wine per week  . Drug use: No    Home Medications Prior to Admission medications   Medication Sig Start Date End Date Taking? Authorizing Provider  cephALEXin (KEFLEX) 500 MG capsule 1 po qd prn for UTI prophylaxis Patient taking differently: Take 500 mg by mouth as needed (UTI prophylaxis).  09/26/19  Yes Plotnikov, Evie Lacks, MD  Cholecalciferol (VITAMIN D3) 2000 units capsule Take 1 capsule (2,000 Units total) by mouth daily. 03/06/16  Yes Plotnikov, Evie Lacks, MD  citalopram (CELEXA) 20 MG tablet Take 1 tablet (20 mg total) by mouth daily. Annual appt is due must see provider for future refills 02/03/19  Yes Plotnikov, Evie Lacks, MD  CRANBERRY EXTRACT PO Take 1 capsule by mouth daily.    Yes [provider]  Estradiol 10 MCG TABS vaginal tablet Place 1 tablet (10 mcg total) vaginally 2 (two) times a week. 04/11/19  Yes Joseph Pierini, MD  fluticasone (FLONASE) 50 MCG/ACT nasal spray Place 2 sprays into both nostrils daily. 04/28/18  Yes Hawks, Christy A, FNP  gabapentin (NEURONTIN) 100 MG capsule Take 1 capsule (100 mg total) by mouth 2 (two) times daily. 09/29/19  Yes Lyndal Pulley, DO  gabapentin (NEURONTIN) 300 MG capsule Take 1 capsule (300 mg total) by mouth 3 (three) times daily. Patient taking differently: Take 300 mg by mouth at bedtime.  02/03/19  Yes Plotnikov, Evie Lacks, MD  levothyroxine (SYNTHROID) 125 MCG tablet Take 1 tablet (125 mcg total) by mouth daily. 02/03/19  Yes Plotnikov, Evie Lacks, MD  methocarbamol (ROBAXIN) 500 MG tablet Take 1 tablet (500 mg total) by mouth daily as needed for muscle spasms. 09/26/19  Yes Plotnikov, Evie Lacks, MD  Multiple Vitamin (MULTIVITAMIN) tablet Take 1 tablet by mouth daily. Life's Abundance    Yes [provider]  NON FORMULARY Take 1 tablet by mouth daily. Allerfex   Yes [provider]  NON FORMULARY Take 1 capsule by mouth daily. Tart cherry extract   Yes [provider]  pantoprazole (PROTONIX) 20 MG tablet Take 1 tablet (20 mg total) by mouth daily. 02/03/19  Yes Plotnikov, Evie Lacks, MD  traZODone (DESYREL) 50 MG tablet TAKE 1/2 TO 1 (ONE-HALF TO ONE) TABLET BY MOUTH AT BEDTIME AS NEEDED FOR SLEEP Patient taking differently: Take  50 mg by mouth at bedtime.  09/04/19  Yes Plotnikov, Evie Lacks, MD  Valerian Root 100 MG CAPS Take 1 capsule by mouth daily.     Yes [provider]  zinc gluconate 50 MG tablet Take 50 mg by mouth daily.   Yes [provider]  benzonatate (TESSALON PERLES) 100 MG capsule Take 1-2 capsules (100-200 mg total) by mouth 3 (three) times daily as needed. Patient not taking: Reported on 12/09/2019 09/26/19   Plotnikov, Evie Lacks, MD  morphine (MSIR) 15 MG tablet Take 0.5 tablets (7.5 mg total) by mouth every 4 (four) hours as needed for severe pain. 12/09/19   Deno Etienne, DO    Allergies    Belviq [lorcaserin hcl], Demerol [meperidine], Elavil [amitriptyline hcl], Other, Latex, and Medical adhesive remover  Review of Systems   Review of Systems  Constitutional: Negative for chills and fever.  HENT: Negative for congestion and rhinorrhea.   Eyes: Negative for redness and visual disturbance.  Respiratory: Negative for shortness of breath and wheezing.   Cardiovascular: Negative for chest pain and palpitations.  Gastrointestinal: Negative for nausea and vomiting.  Genitourinary: Positive for difficulty urinating. Negative for dysuria and urgency.  Musculoskeletal: Positive for arthralgias. Negative for myalgias.  Skin: Negative for pallor and wound.  Neurological: Negative for dizziness and headaches.    Physical Exam Updated Vital Signs BP (!) 112/97   Pulse 88   Temp 100 F (37.8 C) (Oral)   Resp 18   Ht  5\' 3"  (1.6 m)   Wt 66.2 kg   SpO2 96%   BMI 25.86 kg/m   Physical Exam Vitals and nursing note reviewed.  Constitutional:      General: She is not in acute distress.    Appearance: She is well-developed. She is not diaphoretic.  HENT:     Head: Normocephalic and atraumatic.  Eyes:     Pupils: Pupils are equal, round, and reactive to light.  Cardiovascular:     Rate and Rhythm: Normal rate and regular rhythm.     Heart sounds: No murmur heard.  No friction rub. No gallop.   Pulmonary:     Effort: Pulmonary effort is normal.     Breath sounds: No wheezing or rales.  Abdominal:     General: There is no distension.     Palpations: Abdomen is soft.     Tenderness: There is no abdominal tenderness.     Comments: Mild fullness to the suprapubic region  Musculoskeletal:        General: No tenderness.     Cervical back: Normal range of motion and neck supple.     Comments: Pulse motor and sensation intact of the right lower extremity.  Skin:    General: Skin is warm and dry.  Neurological:     Mental Status: She is alert and oriented to person, place, and time.  Psychiatric:        Behavior: Behavior normal.     ED Results / Procedures / Treatments   Labs (all labs ordered are listed, but only abnormal results are displayed) Labs Reviewed  CBC WITH DIFFERENTIAL/PLATELET - Abnormal; Notable for the following components:      Result Value   RBC 2.69 (*)    Hemoglobin 8.6 (*)    HCT 26.0 (*)    Platelets 140 (*)    All other components within normal limits  BASIC METABOLIC PANEL - Abnormal; Notable for the following components:   Sodium 134 (*)  Glucose, Bld 127 (*)    Calcium 7.8 (*)    All other components within normal limits  URINALYSIS, ROUTINE W REFLEX MICROSCOPIC - Abnormal; Notable for the following components:   Color, Urine STRAW (*)    All other components within normal limits    EKG EKG Interpretation  Date/Time:  Friday December 09 2019 10:12:38  EST Ventricular Rate:  83 PR Interval:    QRS Duration: 100 QT Interval:  379 QTC Calculation: 446 R Axis:   61 Text Interpretation: Sinus rhythm Low voltage, precordial leads No significant change since last tracing Confirmed by Deno Etienne [phone]) on 12/09/2019 11:07:06 AM   Radiology DG Hip Unilat W or Wo Pelvis 2-3 Views Right  Result Date: 12/09/2019 CLINICAL DATA:  Fall.  Right hip pain. EXAM: DG HIP (WITH OR WITHOUT PELVIS) 2-3V RIGHT COMPARISON:  MRI 10/09/2019.  Right hip series 09/29/2019. FINDINGS: Total right hip replacement. Hardware intact. Anatomic alignment. Degenerative changes lumbar spine and left hip. No acute abnormality identified. IMPRESSION: Total right hip replacement. Hardware intact. Anatomic alignment. No acute abnormality. Electronically Signed   By: Marcello Moores  Register   On: 12/09/2019 08:47    Procedures Procedures (including critical care time)  Medications Ordered in ED Medications  sodium chloride 0.9 % bolus 1,000 mL (0 mLs Intravenous Stopped 12/09/19 1208)  acetaminophen (TYLENOL) tablet 1,000 mg (1,000 mg Oral Given 12/09/19 1221)    ED Course  I have reviewed the triage vital signs and the nursing notes.  Pertinent labs & imaging results that were available during my care of the patient were reviewed by me and considered in my medical decision making (see chart for details).    MDM Rules/Calculators/A&P                          69 yo F with a chief complaints of a fall. The patient was too weak when she stood up to walk to the bathroom and collapsed to the ground. She denies any specific injury in the fall. She did feel like her right leg bent a bit funny and she is immediately postop from a right hip replacement will obtain a plain film. Lab work for weakness the patient feels is likely due to the increased narcotic use. She is also complaining of acute urinary retention will obtain a bladder scan and have her attempt to urinate. UA, blood work.  Reassess.  Patient's hemoglobin has had a 5 g drop from last week.  I discussed this with her orthopedist, Dr. Rhona Raider.  He felt that this was not an uncommon drop.  He recommended giving a bolus of IV fluids and if she was able to ambulate and felt better than she could likely go home and follow-up with him in the office.  Patient was also complaining of urinary retention has been able to urinate and greater than 2000 cc of urine.  She does have some continued retention on a post void residual but since she is able to urinate freely I discussed risks and benefits of catheterization and we will hold off at this time with expectation that it would likely continue to improve.  The patient and husband felt like the reason for her collapse may have been due to the narcotic use.  They were requesting a change in therapy.  We will switch to oral morphine tablets without Tylenol, will have her take max dose Tylenol and the pain medicine just that she needs to.  2:10 PM:  I have discussed the diagnosis/risks/treatment options with the patient and family and believe the pt to be eligible for discharge home to follow-up with PCP. We also discussed returning to the ED immediately if new or worsening sx occur. We discussed the sx which are most concerning (e.g., sudden worsening pain, fever, inability to tolerate by mouth) that necessitate immediate return. Medications administered to the patient during their visit and any new prescriptions provided to the patient are listed below.  Medications given during this visit Medications  sodium chloride 0.9 % bolus 1,000 mL (0 mLs Intravenous Stopped 12/09/19 1208)  acetaminophen (TYLENOL) tablet 1,000 mg (1,000 mg Oral Given 12/09/19 1221)     The patient appears reasonably screen and/or stabilized for discharge and I doubt any other medical condition or other [facility] requiring further screening, evaluation, or treatment in the ED at this time prior to discharge.      Final Clinical Impression(s) / ED Diagnoses Final diagnoses:  Hematoma of right thigh, initial encounter  Acute blood loss anemia  Collapse  Urinary retention    Rx / DC Orders ED Discharge Orders         Ordered    morphine (MSIR) 15 MG tablet  Every 4 hours PRN        12/09/19 [place], Marquasha Brutus, DO 12/09/19 1412

## 2019-12-09 NOTE — Discharge Instructions (Signed)
Take tylenol 1000mg (2 extra strength) four times a day.   Then take the pain medicine if you feel like you need it. Narcotics do not help with the pain, they only make you care about it less.  You can become addicted to this, people may break into your house to steal it.  It will constipate you.  If you drive under the influence of this medicine you can get a DUI.    Please return for inability to urinate or if you feel worsening weakness.  Follow-up with your orthopedic doctor.

## 2019-12-09 NOTE — ED Notes (Signed)
Bladder scan 783 ml

## 2019-12-09 NOTE — ED Notes (Signed)
Provided pt with ice pack

## 2019-12-09 NOTE — ED Triage Notes (Signed)
PT BIB EMS. Pt fell this morning when trying to get to use the bathroom. Pt had right leg bent underneath her after the fall. Pt had right hip replacement and tear in right groin muscle fixed yesterday. No blood thinners. Pt is concerned due to not having voided in the past 8 hours. EMS gave 178mcg fent.   BP-126/72 HR-88 RR-20 O2-100% RA

## 2019-12-09 NOTE — ED Notes (Signed)
Emptied suction canister - 1200cc urine. Straw colored, clear

## 2019-12-09 NOTE — ED Notes (Signed)
Pt ambulated to restroom and back to room with minimal assistance.

## 2019-12-14 ENCOUNTER — Other Ambulatory Visit: Payer: Self-pay | Admitting: Internal Medicine

## 2019-12-14 DIAGNOSIS — R2689 Other abnormalities of gait and mobility: Secondary | ICD-10-CM | POA: Diagnosis not present

## 2019-12-14 DIAGNOSIS — M25551 Pain in right hip: Secondary | ICD-10-CM | POA: Diagnosis not present

## 2019-12-15 DIAGNOSIS — M25551 Pain in right hip: Secondary | ICD-10-CM | POA: Diagnosis not present

## 2019-12-15 DIAGNOSIS — R2689 Other abnormalities of gait and mobility: Secondary | ICD-10-CM | POA: Diagnosis not present

## 2019-12-16 ENCOUNTER — Other Ambulatory Visit: Payer: Self-pay | Admitting: Internal Medicine

## 2019-12-16 MED ORDER — CEPHALEXIN 500 MG PO CAPS
ORAL_CAPSULE | ORAL | 3 refills | Status: DC
Start: 2019-12-16 — End: 2020-03-13

## 2019-12-19 DIAGNOSIS — Z9889 Other specified postprocedural states: Secondary | ICD-10-CM | POA: Diagnosis not present

## 2019-12-22 DIAGNOSIS — M25551 Pain in right hip: Secondary | ICD-10-CM | POA: Diagnosis not present

## 2019-12-22 DIAGNOSIS — R2689 Other abnormalities of gait and mobility: Secondary | ICD-10-CM | POA: Diagnosis not present

## 2019-12-27 DIAGNOSIS — R2689 Other abnormalities of gait and mobility: Secondary | ICD-10-CM | POA: Diagnosis not present

## 2019-12-27 DIAGNOSIS — M25551 Pain in right hip: Secondary | ICD-10-CM | POA: Diagnosis not present

## 2020-01-10 ENCOUNTER — Other Ambulatory Visit: Payer: Self-pay | Admitting: Internal Medicine

## 2020-01-10 DIAGNOSIS — E034 Atrophy of thyroid (acquired): Secondary | ICD-10-CM

## 2020-01-12 DIAGNOSIS — L218 Other seborrheic dermatitis: Secondary | ICD-10-CM | POA: Diagnosis not present

## 2020-01-12 DIAGNOSIS — L649 Androgenic alopecia, unspecified: Secondary | ICD-10-CM | POA: Diagnosis not present

## 2020-01-12 DIAGNOSIS — Z79899 Other long term (current) drug therapy: Secondary | ICD-10-CM | POA: Diagnosis not present

## 2020-01-16 DIAGNOSIS — M25551 Pain in right hip: Secondary | ICD-10-CM | POA: Diagnosis not present

## 2020-01-16 DIAGNOSIS — R2689 Other abnormalities of gait and mobility: Secondary | ICD-10-CM | POA: Diagnosis not present

## 2020-01-19 ENCOUNTER — Ambulatory Visit: Payer: Medicare Other | Admitting: Podiatry

## 2020-01-24 DIAGNOSIS — Z1231 Encounter for screening mammogram for malignant neoplasm of breast: Secondary | ICD-10-CM | POA: Diagnosis not present

## 2020-01-27 DIAGNOSIS — M25551 Pain in right hip: Secondary | ICD-10-CM | POA: Diagnosis not present

## 2020-01-27 DIAGNOSIS — R2689 Other abnormalities of gait and mobility: Secondary | ICD-10-CM | POA: Diagnosis not present

## 2020-01-28 ENCOUNTER — Other Ambulatory Visit: Payer: Self-pay | Admitting: Internal Medicine

## 2020-01-31 DIAGNOSIS — R2689 Other abnormalities of gait and mobility: Secondary | ICD-10-CM | POA: Diagnosis not present

## 2020-01-31 DIAGNOSIS — M25551 Pain in right hip: Secondary | ICD-10-CM | POA: Diagnosis not present

## 2020-02-08 ENCOUNTER — Encounter: Payer: Medicare Other | Admitting: Internal Medicine

## 2020-02-15 ENCOUNTER — Other Ambulatory Visit: Payer: Self-pay

## 2020-02-15 ENCOUNTER — Encounter: Payer: Self-pay | Admitting: Obstetrics and Gynecology

## 2020-02-15 ENCOUNTER — Ambulatory Visit: Payer: Medicare Other | Admitting: Obstetrics and Gynecology

## 2020-02-15 VITALS — BP 126/84 | HR 84 | Resp 20 | Ht 60.83 in | Wt 148.4 lb

## 2020-02-15 DIAGNOSIS — M8580 Other specified disorders of bone density and structure, unspecified site: Secondary | ICD-10-CM

## 2020-02-15 DIAGNOSIS — Z01419 Encounter for gynecological examination (general) (routine) without abnormal findings: Secondary | ICD-10-CM

## 2020-02-15 NOTE — Progress Notes (Signed)
Ellen [name] 1950/12/27 [id number]  SUBJECTIVE:  70 y.o. [id number] female for annual routine gynecologic exam. She has no gynecologic concerns.   Current Outpatient Medications  Medication Sig Dispense Refill  . benzonatate (TESSALON) 100 MG capsule TAKE 1 TO 2 CAPSULES BY MOUTH THREE TIMES DAILY AS NEEDED 60 capsule 0  . cephALEXin (KEFLEX) 500 MG capsule 1 po qd prn for UTI prophylaxis 20 capsule 3  . Cholecalciferol (VITAMIN D3) 2000 units capsule Take 1 capsule (2,000 Units total) by mouth daily. 100 capsule 3  . citalopram (CELEXA) 20 MG tablet Take 1 tablet (20 mg total) by mouth daily. Annual appt is due must see provider for future refills 90 tablet 3  . CRANBERRY EXTRACT PO Take 1 capsule by mouth daily.     . Estradiol 10 MCG TABS vaginal tablet Place 1 tablet (10 mcg total) vaginally 2 (two) times a week. 8 tablet 9  . fluticasone (FLONASE) 50 MCG/ACT nasal spray Place 2 sprays into both nostrils daily. 11.1 g 2  . gabapentin (NEURONTIN) 100 MG capsule Take 1 capsule (100 mg total) by mouth 2 (two) times daily. 180 capsule 3  . gabapentin (NEURONTIN) 300 MG capsule Take 1 capsule (300 mg total) by mouth 3 (three) times daily. (Patient taking differently: Take 300 mg by mouth at bedtime. ) 90 capsule 3  . levothyroxine (SYNTHROID) 125 MCG tablet Take 1 tablet by mouth once daily 90 tablet 2  . methocarbamol (ROBAXIN) 500 MG tablet Take 1 tablet (500 mg total) by mouth daily as needed for muscle spasms. 30 tablet 5  . Multiple Vitamin (MULTIVITAMIN) tablet Take 1 tablet by mouth daily. Life's Abundance    . NON FORMULARY Take 1 tablet by mouth daily. Allerfex    . NON FORMULARY Take 1 capsule by mouth daily. Tart cherry extract    . pantoprazole (PROTONIX) 20 MG tablet Take 1 tablet (20 mg total) by mouth daily. 90 tablet 3  . traZODone (DESYREL) 50 MG tablet TAKE 1/2 TO 1 (ONE-HALF TO ONE) TABLET BY MOUTH AT BEDTIME AS NEEDED FOR SLEEP (Patient taking differently: Take 50 mg by  mouth at bedtime. ) 90 tablet 1  . Valerian Root 100 MG CAPS Take 1 capsule by mouth daily.      [name] zinc gluconate 50 MG tablet Take 50 mg by mouth daily.     No current facility-administered medications for this visit.   Allergies: Belviq [lorcaserin hcl], Demerol [meperidine], Elavil [amitriptyline hcl], Other, Latex, and Medical adhesive remover  No LMP recorded. Patient has had a hysterectomy.  Past medical history,surgical history, problem list, medications, allergies, family history and social history were all reviewed and documented as reviewed in the EPIC chart.  ROS:  Pertinent positives and negatives as reviewed in HPI   OBJECTIVE:  BP 126/84 (BP Location: Right Arm, Patient Position: Sitting)   Pulse 84   Resp 20   Ht 5' 0.83" (1.545 m)   Wt 148 lb 6.4 oz (67.3 kg)   BMI 28.20 kg/m  The patient appears well, alert, oriented, in no distress.  BREAST EXAM: breasts appear normal, no suspicious masses, no skin or nipple changes or axillary nodes  PELVIC EXAM: VULVA: normal appearing vulva with no masses, tenderness or lesions, atrophic changes noted, VAGINA: normal appearing vagina with normal color and discharge, no lesions, CERVIX: surgically absent, UTERUS: surgically absent, vaginal cuff well healed, ADNEXA: no masses, no tenderness  Chaperone: Glorianne Manchester RN present during the examination  ASSESSMENT:  70  y.o. [id number] here for annual gynecologic exam  PLAN:   1. Postmenopausal. Prior hysterectomy ([facility] in 1991 for AUB).  Using Vagifem once weekly which is fine as less is better if effective, and we reviewed rare risks with systemic absorption.  Will notify us if she needs a refill. 2. Pap smear 12/2016. Not repeated today. No prior history of abnormal Pap smears.  She is okay with not having Pap smear surveillance based on age criteria and prior hysterectomy. 3. Mammogram at [facility], report pending. Recommend continuing with annual mammography. Breast exam normal today. 4.  Colonoscopy 2011.  States she is repeating it this year 04/2020. 5. Osteopenia.   DEXA 01/2018 T score -2.3 at the distal third of the radius.  Normal values at the spine and right and left hips.  She is active and healthy.  DEXA indicated again and she will plan to schedule.  The patient is aware that I will only be at this practice until early March 2022 so she knows to make sure she requests follow-up on results for any medical tests that she does when I am no longer at the practice. 6. Health maintenance.  No lab work as she has this completed with her primary care provider.   Return annually or sooner, prn.  Joseph Pierini MD  02/15/20

## 2020-02-16 DIAGNOSIS — R2689 Other abnormalities of gait and mobility: Secondary | ICD-10-CM | POA: Diagnosis not present

## 2020-02-16 DIAGNOSIS — M25551 Pain in right hip: Secondary | ICD-10-CM | POA: Diagnosis not present

## 2020-02-21 DIAGNOSIS — R2689 Other abnormalities of gait and mobility: Secondary | ICD-10-CM | POA: Diagnosis not present

## 2020-02-21 DIAGNOSIS — M25551 Pain in right hip: Secondary | ICD-10-CM | POA: Diagnosis not present

## 2020-02-24 DIAGNOSIS — R2689 Other abnormalities of gait and mobility: Secondary | ICD-10-CM | POA: Diagnosis not present

## 2020-02-24 DIAGNOSIS — M25551 Pain in right hip: Secondary | ICD-10-CM | POA: Diagnosis not present

## 2020-02-27 DIAGNOSIS — R2689 Other abnormalities of gait and mobility: Secondary | ICD-10-CM | POA: Diagnosis not present

## 2020-02-27 DIAGNOSIS — M25551 Pain in right hip: Secondary | ICD-10-CM | POA: Diagnosis not present

## 2020-02-28 ENCOUNTER — Other Ambulatory Visit: Payer: Self-pay

## 2020-02-29 ENCOUNTER — Ambulatory Visit (INDEPENDENT_AMBULATORY_CARE_PROVIDER_SITE_OTHER): Payer: Medicare Other | Admitting: Internal Medicine

## 2020-02-29 ENCOUNTER — Encounter: Payer: Self-pay | Admitting: Internal Medicine

## 2020-02-29 VITALS — BP 128/80 | HR 63 | Temp 98.3°F | Ht 62.0 in | Wt 147.8 lb

## 2020-02-29 DIAGNOSIS — R635 Abnormal weight gain: Secondary | ICD-10-CM | POA: Diagnosis not present

## 2020-02-29 DIAGNOSIS — Z0000 Encounter for general adult medical examination without abnormal findings: Secondary | ICD-10-CM

## 2020-02-29 DIAGNOSIS — L659 Nonscarring hair loss, unspecified: Secondary | ICD-10-CM | POA: Insufficient documentation

## 2020-02-29 DIAGNOSIS — F411 Generalized anxiety disorder: Secondary | ICD-10-CM

## 2020-02-29 DIAGNOSIS — E034 Atrophy of thyroid (acquired): Secondary | ICD-10-CM

## 2020-02-29 LAB — URINALYSIS, ROUTINE W REFLEX MICROSCOPIC
Bilirubin Urine: NEGATIVE
Hgb urine dipstick: NEGATIVE
Nitrite: NEGATIVE
Specific Gravity, Urine: 1.025 (ref 1.000–1.030)
Total Protein, Urine: NEGATIVE
Urine Glucose: NEGATIVE
Urobilinogen, UA: 0.2 (ref 0.0–1.0)
pH: 6 (ref 5.0–8.0)

## 2020-02-29 LAB — COMPREHENSIVE METABOLIC PANEL
ALT: 12 U/L (ref 0–35)
AST: 19 U/L (ref 0–37)
Albumin: 4.2 g/dL (ref 3.5–5.2)
Alkaline Phosphatase: 80 U/L (ref 39–117)
BUN: 17 mg/dL (ref 6–23)
CO2: 30 mEq/L (ref 19–32)
Calcium: 9 mg/dL (ref 8.4–10.5)
Chloride: 103 mEq/L (ref 96–112)
Creatinine, Ser: 0.69 mg/dL (ref 0.40–1.20)
GFR: 87.3 mL/min (ref >60.00)
Glucose, Bld: 88 mg/dL (ref 70–99)
Potassium: 4.2 mEq/L (ref 3.5–5.1)
Sodium: 140 mEq/L (ref 135–145)
Total Bilirubin: 0.3 mg/dL (ref 0.2–1.2)
Total Protein: 6.8 g/dL (ref 6.0–8.3)

## 2020-02-29 LAB — CBC WITH DIFFERENTIAL/PLATELET
Basophils Absolute: 0.1 10*3/uL (ref 0.0–0.1)
Basophils Relative: 1.6 % (ref 0.0–3.0)
Eosinophils Absolute: 0.4 10*3/uL (ref 0.0–0.7)
Eosinophils Relative: 8.5 % — ABNORMAL HIGH (ref 0.0–5.0)
HCT: 38.1 % (ref 36.0–46.0)
Hemoglobin: 12.3 g/dL (ref 12.0–15.0)
Lymphocytes Relative: 43.3 % (ref 12.0–46.0)
Lymphs Abs: 2 10*3/uL (ref 0.7–4.0)
MCHC: 32.4 g/dL (ref 30.0–36.0)
MCV: 84.3 fl (ref 78.0–100.0)
Monocytes Absolute: 0.5 10*3/uL (ref 0.1–1.0)
Monocytes Relative: 10.5 % (ref 3.0–12.0)
Neutro Abs: 1.7 10*3/uL (ref 1.4–7.7)
Neutrophils Relative %: 36.1 % — ABNORMAL LOW (ref 43.0–77.0)
Platelets: 208 10*3/uL (ref 150.0–400.0)
RBC: 4.51 Mil/uL (ref 3.87–5.11)
RDW: 14.7 % (ref 11.5–15.5)
WBC: 4.7 10*3/uL (ref 4.0–10.5)

## 2020-02-29 LAB — LIPID PANEL
Cholesterol: 226 mg/dL — ABNORMAL HIGH (ref 0–200)
HDL: 77.5 mg/dL (ref >39.00)
LDL Cholesterol: 128 mg/dL — ABNORMAL HIGH (ref 0–99)
NonHDL: 148.42
Total CHOL/HDL Ratio: 3
Triglycerides: 100 mg/dL (ref 0.0–149.0)
VLDL: 20 mg/dL (ref 0.0–40.0)

## 2020-02-29 LAB — TSH: TSH: 2.26 u[IU]/mL (ref 0.35–4.50)

## 2020-02-29 LAB — T4, FREE: Free T4: 0.89 ng/dL (ref 0.60–1.60)

## 2020-02-29 MED ORDER — GABAPENTIN 300 MG PO CAPS
300.0000 mg | ORAL_CAPSULE | Freq: Every day | ORAL | 3 refills | Status: DC
Start: 2020-02-29 — End: 2020-07-02

## 2020-02-29 MED ORDER — TRAZODONE HCL 50 MG PO TABS
50.0000 mg | ORAL_TABLET | Freq: Every day | ORAL | 3 refills | Status: DC
Start: 2020-02-29 — End: 2021-02-14

## 2020-02-29 MED ORDER — PANTOPRAZOLE SODIUM 20 MG PO TBEC
20.0000 mg | DELAYED_RELEASE_TABLET | Freq: Every day | ORAL | 3 refills | Status: DC
Start: 2020-02-29 — End: 2021-03-04

## 2020-02-29 MED ORDER — METHOCARBAMOL 500 MG PO TABS
500.0000 mg | ORAL_TABLET | Freq: Every day | ORAL | 0 refills | Status: DC | PRN
Start: 2020-02-29 — End: 2020-08-28

## 2020-02-29 MED ORDER — LEVOTHYROXINE SODIUM 125 MCG PO TABS
125.0000 ug | ORAL_TABLET | Freq: Every day | ORAL | 3 refills | Status: DC
Start: 2020-02-29 — End: 2020-08-28

## 2020-02-29 MED ORDER — CITALOPRAM HYDROBROMIDE 20 MG PO TABS
20.0000 mg | ORAL_TABLET | Freq: Every day | ORAL | 3 refills | Status: DC
Start: 2020-02-29 — End: 2020-08-28

## 2020-02-29 NOTE — Assessment & Plan Note (Signed)
On Citalopram 

## 2020-02-29 NOTE — Assessment & Plan Note (Signed)
TSH, FT4 

## 2020-02-29 NOTE — Assessment & Plan Note (Signed)
Using Hairstim for hair loss - Dr Ubaldo Glassing

## 2020-02-29 NOTE — Progress Notes (Signed)
Subjective:  Patient ID: Ellen [name], female    DOB: 1950/12/13  Age: 70 y.o. MRN: [id number]  CC: Annual Exam   HPI Ellen [name] presents for hypothyroidism, GERD Well exam Using Hairstim for hair loss   Outpatient Medications Prior to Visit  Medication Sig Dispense Refill  . benzonatate (TESSALON) 100 MG capsule TAKE 1 TO 2 CAPSULES BY MOUTH THREE TIMES DAILY AS NEEDED 60 capsule 0  . cephALEXin (KEFLEX) 500 MG capsule 1 po qd prn for UTI prophylaxis 20 capsule 3  . Cholecalciferol (VITAMIN D3) 2000 units capsule Take 1 capsule (2,000 Units total) by mouth daily. 100 capsule 3  . CRANBERRY EXTRACT PO Take 1 capsule by mouth daily.     . Estradiol 10 MCG TABS vaginal tablet Place 1 tablet (10 mcg total) vaginally 2 (two) times a week. 8 tablet 9  . fluticasone (FLONASE) 50 MCG/ACT nasal spray Place 2 sprays into both nostrils daily. 11.1 g 2  . gabapentin (NEURONTIN) 100 MG capsule Take 1 capsule (100 mg total) by mouth 2 (two) times daily. 180 capsule 3  . Multiple Vitamin (MULTIVITAMIN) tablet Take 1 tablet by mouth daily. Life's Abundance    . NON FORMULARY Take 1 tablet by mouth daily. Allerfex    . NON FORMULARY Take 1 capsule by mouth daily. Tart cherry extract    . Valerian Root 100 MG CAPS Take 1 capsule by mouth daily.    [name] zinc gluconate 50 MG tablet Take 50 mg by mouth daily.    . citalopram (CELEXA) 20 MG tablet Take 1 tablet (20 mg total) by mouth daily. Annual appt is due must see provider for future refills 90 tablet 3  . gabapentin (NEURONTIN) 300 MG capsule Take 1 capsule (300 mg total) by mouth 3 (three) times daily. (Patient taking differently: Take 300 mg by mouth at bedtime.) 90 capsule 3  . levothyroxine (SYNTHROID) 125 MCG tablet Take 1 tablet by mouth once daily 90 tablet 2  . methocarbamol (ROBAXIN) 500 MG tablet Take 1 tablet (500 mg total) by mouth daily as needed for muscle spasms. 30 tablet 5  . pantoprazole (PROTONIX) 20 MG tablet Take 1  tablet (20 mg total) by mouth daily. 90 tablet 3  . traZODone (DESYREL) 50 MG tablet TAKE 1/2 TO 1 (ONE-HALF TO ONE) TABLET BY MOUTH AT BEDTIME AS NEEDED FOR SLEEP (Patient taking differently: Take 50 mg by mouth at bedtime.) 90 tablet 1   No facility-administered medications prior to visit.    ROS: Review of Systems  Constitutional: Negative for activity change, appetite change, chills, fatigue and unexpected weight change.  HENT: Negative for congestion, mouth sores and sinus pressure.   Eyes: Negative for visual disturbance.  Respiratory: Negative for cough and chest tightness.   Gastrointestinal: Negative for abdominal pain and nausea.  Genitourinary: Negative for difficulty urinating, frequency and vaginal pain.  Musculoskeletal: Negative for back pain and gait problem.  Skin: Negative for pallor and rash.  Neurological: Negative for dizziness, tremors, weakness, numbness and headaches.  Psychiatric/Behavioral: Negative for confusion and sleep disturbance. The patient is not nervous/anxious.     Objective:  BP 128/80 (BP Location: Left Arm)   Pulse 63   Temp 98.3 F (36.8 C) (Oral)   Ht 5\' 2"  (1.575 m)   Wt 147 lb 12.8 oz (67 kg)   SpO2 97%   BMI 27.03 kg/m   BP Readings from Last 3 Encounters:  02/29/20 128/80  02/15/20 126/84  12/09/19 (!) 112/97  Wt Readings from Last 3 Encounters:  02/29/20 147 lb 12.8 oz (67 kg)  02/15/20 148 lb 6.4 oz (67.3 kg)  12/09/19 146 lb (66.2 kg)    Physical Exam Constitutional:      General: She is not in acute distress.    Appearance: She is well-developed.  HENT:     Head: Normocephalic.     Right Ear: External ear normal.     Left Ear: External ear normal.     Nose: Nose normal.     Mouth/Throat:     Mouth: Oropharynx is clear and moist.  Eyes:     General:        Right eye: No discharge.        Left eye: No discharge.     Conjunctiva/sclera: Conjunctivae normal.     Pupils: Pupils are equal, round, and reactive to  light.  Neck:     Thyroid: No thyromegaly.     Vascular: No JVD.     Trachea: No tracheal deviation.  Cardiovascular:     Rate and Rhythm: Normal rate and regular rhythm.     Heart sounds: Normal heart sounds.  Pulmonary:     Effort: No respiratory distress.     Breath sounds: No stridor. No wheezing.  Abdominal:     General: Bowel sounds are normal. There is no distension.     Palpations: Abdomen is soft. There is no mass.     Tenderness: There is no abdominal tenderness. There is no guarding or rebound.  Musculoskeletal:        General: No tenderness or edema.     Cervical back: Normal range of motion and neck supple.  Lymphadenopathy:     Cervical: No cervical adenopathy.  Skin:    Findings: No erythema or rash.  Neurological:     Cranial Nerves: No cranial nerve deficit.     Motor: No abnormal muscle tone.     Coordination: Coordination normal.     Deep Tendon Reflexes: Reflexes normal.  Psychiatric:        Mood and Affect: Mood and affect normal.        Behavior: Behavior normal.        Thought Content: Thought content normal.        Judgment: Judgment normal.     Lab Results  Component Value Date   WBC 5.5 12/09/2019   HGB 8.6 (L) 12/09/2019   HCT 26.0 (L) 12/09/2019   PLT 140 (L) 12/09/2019   GLUCOSE 127 (H) 12/09/2019   CHOL 243 (H) 09/26/2019   TRIG 160 (H) 09/26/2019   HDL 74 09/26/2019   LDLDIRECT 142.6 07/09/2011   LDLCALC 139 (H) 09/26/2019   ALT 14 09/26/2019   AST 20 09/26/2019   NA 134 (L) 12/09/2019   K 3.5 12/09/2019   CL 103 12/09/2019   CREATININE 0.70 12/09/2019   BUN 10 12/09/2019   CO2 25 12/09/2019   TSH 1.31 09/26/2019   HGBA1C 5.6 10/12/2006    DG Hip Unilat W or Wo Pelvis 2-3 Views Right  Result Date: 12/09/2019 CLINICAL DATA:  Fall.  Right hip pain. EXAM: DG HIP (WITH OR WITHOUT PELVIS) 2-3V RIGHT COMPARISON:  MRI 10/09/2019.  Right hip series 09/29/2019. FINDINGS: Total right hip replacement. Hardware intact. Anatomic  alignment. Degenerative changes lumbar spine and left hip. No acute abnormality identified. IMPRESSION: Total right hip replacement. Hardware intact. Anatomic alignment. No acute abnormality. Electronically Signed   By: Marcello Moores  Register   On: 12/09/2019 08:47  Assessment & Plan:   There are no diagnoses linked to this encounter.   Meds ordered this encounter  Medications  . citalopram (CELEXA) 20 MG tablet    Sig: Take 1 tablet (20 mg total) by mouth daily.    Dispense:  90 tablet    Refill:  3  . gabapentin (NEURONTIN) 300 MG capsule    Sig: Take 1 capsule (300 mg total) by mouth at bedtime.    Dispense:  90 capsule    Refill:  3  . levothyroxine (SYNTHROID) 125 MCG tablet    Sig: Take 1 tablet (125 mcg total) by mouth daily.    Dispense:  90 tablet    Refill:  3  . methocarbamol (ROBAXIN) 500 MG tablet    Sig: Take 1 tablet (500 mg total) by mouth daily as needed for muscle spasms.    Dispense:  90 tablet    Refill:  0  . pantoprazole (PROTONIX) 20 MG tablet    Sig: Take 1 tablet (20 mg total) by mouth daily.    Dispense:  90 tablet    Refill:  3  . traZODone (DESYREL) 50 MG tablet    Sig: Take 1 tablet (50 mg total) by mouth at bedtime.    Dispense:  90 tablet    Refill:  3     Follow-up: No follow-ups on file.  Walker Kehr, MD

## 2020-02-29 NOTE — Assessment & Plan Note (Addendum)
  We discussed age appropriate health related issues, including available/recomended screening tests and vaccinations. Labs were ordered to be later reviewed . All questions were answered. We discussed one or more of the following - seat belt use, use of sunscreen/sun exposure exercise, safe sex, fall risk reduction, second hand smoke exposure, firearm use and storage, seat belt use, a need for adhering to healthy diet and exercise. Labs were ordered.  All questions were answered. Colon is sch for 4/22

## 2020-02-29 NOTE — Addendum Note (Signed)
Addended by: Raliegh Ip on: 02/29/2020 09:45 AM   Modules accepted: Orders

## 2020-02-29 NOTE — Assessment & Plan Note (Signed)
Wt Readings from Last 3 Encounters:  02/29/20 147 lb 12.8 oz (67 kg)  02/15/20 148 lb 6.4 oz (67.3 kg)  12/09/19 146 lb (66.2 kg)

## 2020-03-01 DIAGNOSIS — M25551 Pain in right hip: Secondary | ICD-10-CM | POA: Diagnosis not present

## 2020-03-01 DIAGNOSIS — R2689 Other abnormalities of gait and mobility: Secondary | ICD-10-CM | POA: Diagnosis not present

## 2020-03-02 ENCOUNTER — Other Ambulatory Visit: Payer: Self-pay | Admitting: Internal Medicine

## 2020-03-02 MED ORDER — CIPROFLOXACIN HCL 250 MG PO TABS: 250.0000 mg | ORAL_TABLET | Freq: Two times a day (BID) | ORAL | 0 refills | Status: DC

## 2020-03-08 DIAGNOSIS — M25551 Pain in right hip: Secondary | ICD-10-CM | POA: Diagnosis not present

## 2020-03-08 DIAGNOSIS — R2689 Other abnormalities of gait and mobility: Secondary | ICD-10-CM | POA: Diagnosis not present

## 2020-03-09 DIAGNOSIS — Z96641 Presence of right artificial hip joint: Secondary | ICD-10-CM | POA: Diagnosis not present

## 2020-03-09 DIAGNOSIS — Z9889 Other specified postprocedural states: Secondary | ICD-10-CM | POA: Diagnosis not present

## 2020-03-13 ENCOUNTER — Other Ambulatory Visit: Payer: Self-pay

## 2020-03-13 ENCOUNTER — Ambulatory Visit (INDEPENDENT_AMBULATORY_CARE_PROVIDER_SITE_OTHER): Payer: Medicare Other | Admitting: Internal Medicine

## 2020-03-13 ENCOUNTER — Encounter: Payer: Self-pay | Admitting: Internal Medicine

## 2020-03-13 VITALS — BP 122/82 | HR 68 | Temp 98.9°F

## 2020-03-13 DIAGNOSIS — R300 Dysuria: Secondary | ICD-10-CM | POA: Insufficient documentation

## 2020-03-13 DIAGNOSIS — F32A Depression, unspecified: Secondary | ICD-10-CM

## 2020-03-13 DIAGNOSIS — E034 Atrophy of thyroid (acquired): Secondary | ICD-10-CM | POA: Diagnosis not present

## 2020-03-13 DIAGNOSIS — N390 Urinary tract infection, site not specified: Secondary | ICD-10-CM | POA: Diagnosis not present

## 2020-03-13 DIAGNOSIS — M1611 Unilateral primary osteoarthritis, right hip: Secondary | ICD-10-CM

## 2020-03-13 DIAGNOSIS — F411 Generalized anxiety disorder: Secondary | ICD-10-CM | POA: Diagnosis not present

## 2020-03-13 LAB — POCT URINALYSIS DIPSTICK
Appearance: NEGATIVE
Bilirubin, UA: NEGATIVE
Blood, UA: NEGATIVE
Glucose, UA: NEGATIVE
Ketones, UA: NEGATIVE
Leukocytes, UA: NEGATIVE
Nitrite, UA: NEGATIVE
Protein, UA: NEGATIVE
Spec Grav, UA: 1.015 (ref 1.010–1.025)
Urobilinogen, UA: 0.2 E.U./dL
pH, UA: 7.5 (ref 5.0–8.0)

## 2020-03-13 MED ORDER — PHENAZOPYRIDINE HCL 100 MG PO TABS: 100.0000 mg | ORAL_TABLET | Freq: Three times a day (TID) | ORAL | 1 refills | Status: DC | PRN

## 2020-03-13 NOTE — Assessment & Plan Note (Signed)
On Levothroid 

## 2020-03-13 NOTE — Progress Notes (Signed)
Subjective:  Patient ID: Ellen [name], female    DOB: 10-30-1950  Age: 70 y.o. MRN: [id number]  CC: Follow-up (F/U on UTI )   HPI Zaleigh Anisah Kuck presents for ?UTI. Lilias took Cipro C/o burning in the bladder area, pressure Cipro made it better C/o 6-7 UTI's last year - prn Keflex helped F/u OA, depression  Outpatient Medications Prior to Visit  Medication Sig Dispense Refill  . benzonatate (TESSALON) 100 MG capsule TAKE 1 TO 2 CAPSULES BY MOUTH THREE TIMES DAILY AS NEEDED 60 capsule 0  . Cholecalciferol (VITAMIN D3) 2000 units capsule Take 1 capsule (2,000 Units total) by mouth daily. 100 capsule 3  . ciprofloxacin (CIPRO) 250 MG tablet Take 1 tablet (250 mg total) by mouth 2 (two) times daily. 6 tablet 0  . citalopram (CELEXA) 20 MG tablet Take 1 tablet (20 mg total) by mouth daily. 90 tablet 3  . CRANBERRY EXTRACT PO Take 1 capsule by mouth daily.     . Estradiol 10 MCG TABS vaginal tablet Place 1 tablet (10 mcg total) vaginally 2 (two) times a week. 8 tablet 9  . fluticasone (FLONASE) 50 MCG/ACT nasal spray Place 2 sprays into both nostrils daily. 11.1 g 2  . gabapentin (NEURONTIN) 100 MG capsule Take 1 capsule (100 mg total) by mouth 2 (two) times daily. 180 capsule 3  . gabapentin (NEURONTIN) 300 MG capsule Take 1 capsule (300 mg total) by mouth at bedtime. 90 capsule 3  . levothyroxine (SYNTHROID) 125 MCG tablet Take 1 tablet (125 mcg total) by mouth daily. 90 tablet 3  . methocarbamol (ROBAXIN) 500 MG tablet Take 1 tablet (500 mg total) by mouth daily as needed for muscle spasms. 90 tablet 0  . Multiple Vitamin (MULTIVITAMIN) tablet Take 1 tablet by mouth daily. Life's Abundance    . NON FORMULARY Take 1 tablet by mouth daily. Allerfex    . NON FORMULARY Take 1 capsule by mouth daily. Tart cherry extract    . pantoprazole (PROTONIX) 20 MG tablet Take 1 tablet (20 mg total) by mouth daily. 90 tablet 3  . traZODone (DESYREL) 50 MG tablet Take 1 tablet (50 mg total) by  mouth at bedtime. 90 tablet 3  . Valerian Root 100 MG CAPS Take 1 capsule by mouth daily.    [name] zinc gluconate 50 MG tablet Take 50 mg by mouth daily.    . cephALEXin (KEFLEX) 500 MG capsule 1 po qd prn for UTI prophylaxis (Patient not taking: Reported on 03/13/2020) 20 capsule 3   No facility-administered medications prior to visit.    ROS: Review of Systems  Constitutional: Negative for activity change, appetite change, chills, fatigue and unexpected weight change.  HENT: Negative for congestion, mouth sores and sinus pressure.   Eyes: Negative for visual disturbance.  Respiratory: Negative for cough and chest tightness.   Gastrointestinal: Negative for abdominal pain and nausea.  Genitourinary: Positive for dysuria. Negative for difficulty urinating, frequency and vaginal pain.  Musculoskeletal: Negative for back pain and gait problem.  Skin: Negative for pallor and rash.  Neurological: Negative for dizziness, tremors, weakness, numbness and headaches.  Psychiatric/Behavioral: Negative for confusion and sleep disturbance.    Objective:  BP 122/82 (BP Location: Left Arm)   Pulse 68   Temp 98.9 F (37.2 C) (Oral)   SpO2 97%   BP Readings from Last 3 Encounters:  03/13/20 122/82  02/29/20 128/80  02/15/20 126/84    Wt Readings from Last 3 Encounters:  02/29/20 147 lb  12.8 oz (67 kg)  02/15/20 148 lb 6.4 oz (67.3 kg)  12/09/19 146 lb (66.2 kg)    Physical Exam Constitutional:      General: She is not in acute distress.    Appearance: She is well-developed.  HENT:     Head: Normocephalic.     Right Ear: External ear normal.     Left Ear: External ear normal.     Nose: Nose normal.     Mouth/Throat:     Mouth: Oropharynx is clear and moist.  Eyes:     General:        Right eye: No discharge.        Left eye: No discharge.     Conjunctiva/sclera: Conjunctivae normal.     Pupils: Pupils are equal, round, and reactive to light.  Neck:     Thyroid: No thyromegaly.      Vascular: No JVD.     Trachea: No tracheal deviation.  Cardiovascular:     Rate and Rhythm: Normal rate and regular rhythm.     Heart sounds: Normal heart sounds.  Pulmonary:     Effort: No respiratory distress.     Breath sounds: No stridor. No wheezing.  Abdominal:     General: Bowel sounds are normal. There is no distension.     Palpations: Abdomen is soft. There is no mass.     Tenderness: There is no abdominal tenderness. There is no guarding or rebound.  Musculoskeletal:        General: No tenderness or edema.     Cervical back: Normal range of motion and neck supple.  Lymphadenopathy:     Cervical: No cervical adenopathy.  Skin:    Findings: No erythema or rash.  Neurological:     Cranial Nerves: No cranial nerve deficit.     Motor: No abnormal muscle tone.     Coordination: Coordination normal.     Deep Tendon Reflexes: Reflexes normal.  Psychiatric:        Mood and Affect: Mood and affect normal.        Behavior: Behavior normal.        Thought Content: Thought content normal.        Judgment: Judgment normal.      Lab Results  Component Value Date   WBC 4.7 02/29/2020   HGB 12.3 02/29/2020   HCT 38.1 02/29/2020   PLT 208.0 02/29/2020   GLUCOSE 88 02/29/2020   CHOL 226 (H) 02/29/2020   TRIG 100.0 02/29/2020   HDL 77.50 02/29/2020   LDLDIRECT 142.6 07/09/2011   LDLCALC 128 (H) 02/29/2020   ALT 12 02/29/2020   AST 19 02/29/2020   NA 140 02/29/2020   K 4.2 02/29/2020   CL 103 02/29/2020   CREATININE 0.69 02/29/2020   BUN 17 02/29/2020   CO2 30 02/29/2020   TSH 2.26 02/29/2020   HGBA1C 5.6 10/12/2006    DG Hip Unilat W or Wo Pelvis 2-3 Views Right  Result Date: 12/09/2019 CLINICAL DATA:  Fall.  Right hip pain. EXAM: DG HIP (WITH OR WITHOUT PELVIS) 2-3V RIGHT COMPARISON:  MRI 10/09/2019.  Right hip series 09/29/2019. FINDINGS: Total right hip replacement. Hardware intact. Anatomic alignment. Degenerative changes lumbar spine and left hip. No acute  abnormality identified. IMPRESSION: Total right hip replacement. Hardware intact. Anatomic alignment. No acute abnormality. Electronically Signed   By: Marcello Moores  Register   On: 12/09/2019 08:47    Assessment & Plan:    Follow-up: No follow-ups on file.  Walker Kehr, MD

## 2020-03-13 NOTE — Assessment & Plan Note (Addendum)
Cont w/Celexa

## 2020-03-13 NOTE — Assessment & Plan Note (Signed)
Much better after THR

## 2020-03-13 NOTE — Assessment & Plan Note (Signed)
Recurrent cystitis Keflex prn for UTI prophylaxis - long bike rides etc D mannose OTC Use bathroom more often Urology ref

## 2020-03-13 NOTE — Assessment & Plan Note (Signed)
Cont w/Citalopram

## 2020-03-13 NOTE — Assessment & Plan Note (Signed)
UA today was nl Use Vaginal Estrogen 2 days per week

## 2020-03-29 DIAGNOSIS — H93293 Other abnormal auditory perceptions, bilateral: Secondary | ICD-10-CM | POA: Diagnosis not present

## 2020-04-01 ENCOUNTER — Encounter: Payer: Self-pay | Admitting: Family Medicine

## 2020-04-04 ENCOUNTER — Other Ambulatory Visit: Payer: Self-pay | Admitting: Obstetrics and Gynecology

## 2020-04-04 ENCOUNTER — Other Ambulatory Visit: Payer: Self-pay

## 2020-04-04 ENCOUNTER — Ambulatory Visit (INDEPENDENT_AMBULATORY_CARE_PROVIDER_SITE_OTHER): Payer: Medicare Other

## 2020-04-04 DIAGNOSIS — M85832 Other specified disorders of bone density and structure, left forearm: Secondary | ICD-10-CM

## 2020-04-04 DIAGNOSIS — M8580 Other specified disorders of bone density and structure, unspecified site: Secondary | ICD-10-CM

## 2020-04-04 DIAGNOSIS — Z780 Asymptomatic menopausal state: Secondary | ICD-10-CM

## 2020-04-04 DIAGNOSIS — Z01419 Encounter for gynecological examination (general) (routine) without abnormal findings: Secondary | ICD-10-CM

## 2020-04-23 ENCOUNTER — Encounter: Payer: Self-pay | Admitting: Internal Medicine

## 2020-04-25 DIAGNOSIS — H04123 Dry eye syndrome of bilateral lacrimal glands: Secondary | ICD-10-CM | POA: Diagnosis not present

## 2020-04-25 DIAGNOSIS — H2513 Age-related nuclear cataract, bilateral: Secondary | ICD-10-CM | POA: Diagnosis not present

## 2020-04-25 DIAGNOSIS — H43813 Vitreous degeneration, bilateral: Secondary | ICD-10-CM | POA: Diagnosis not present

## 2020-05-03 ENCOUNTER — Other Ambulatory Visit: Payer: Self-pay | Admitting: Internal Medicine

## 2020-07-01 ENCOUNTER — Other Ambulatory Visit: Payer: Self-pay | Admitting: Internal Medicine

## 2020-07-22 ENCOUNTER — Other Ambulatory Visit: Payer: Self-pay | Admitting: Internal Medicine

## 2020-08-01 DIAGNOSIS — L570 Actinic keratosis: Secondary | ICD-10-CM | POA: Diagnosis not present

## 2020-08-01 DIAGNOSIS — D485 Neoplasm of uncertain behavior of skin: Secondary | ICD-10-CM | POA: Diagnosis not present

## 2020-08-07 DIAGNOSIS — K5730 Diverticulosis of large intestine without perforation or abscess without bleeding: Secondary | ICD-10-CM | POA: Diagnosis not present

## 2020-08-07 DIAGNOSIS — Z1211 Encounter for screening for malignant neoplasm of colon: Secondary | ICD-10-CM | POA: Diagnosis not present

## 2020-08-07 DIAGNOSIS — D123 Benign neoplasm of transverse colon: Secondary | ICD-10-CM | POA: Diagnosis not present

## 2020-08-07 DIAGNOSIS — R12 Heartburn: Secondary | ICD-10-CM | POA: Diagnosis not present

## 2020-08-07 DIAGNOSIS — K2940 Chronic atrophic gastritis without bleeding: Secondary | ICD-10-CM | POA: Diagnosis not present

## 2020-08-07 DIAGNOSIS — D127 Benign neoplasm of rectosigmoid junction: Secondary | ICD-10-CM | POA: Diagnosis not present

## 2020-08-07 DIAGNOSIS — K259 Gastric ulcer, unspecified as acute or chronic, without hemorrhage or perforation: Secondary | ICD-10-CM | POA: Diagnosis not present

## 2020-08-07 DIAGNOSIS — K644 Residual hemorrhoidal skin tags: Secondary | ICD-10-CM | POA: Diagnosis not present

## 2020-08-07 DIAGNOSIS — K648 Other hemorrhoids: Secondary | ICD-10-CM | POA: Diagnosis not present

## 2020-08-07 DIAGNOSIS — K31A19 Gastric intestinal metaplasia without dysplasia, unspecified site: Secondary | ICD-10-CM | POA: Diagnosis not present

## 2020-08-07 LAB — HM COLONOSCOPY

## 2020-08-08 ENCOUNTER — Encounter: Payer: Self-pay | Admitting: Family Medicine

## 2020-08-14 DIAGNOSIS — D123 Benign neoplasm of transverse colon: Secondary | ICD-10-CM | POA: Diagnosis not present

## 2020-08-14 DIAGNOSIS — D127 Benign neoplasm of rectosigmoid junction: Secondary | ICD-10-CM | POA: Diagnosis not present

## 2020-08-14 DIAGNOSIS — K31A19 Gastric intestinal metaplasia without dysplasia, unspecified site: Secondary | ICD-10-CM | POA: Diagnosis not present

## 2020-08-14 DIAGNOSIS — K2940 Chronic atrophic gastritis without bleeding: Secondary | ICD-10-CM | POA: Diagnosis not present

## 2020-08-28 ENCOUNTER — Encounter: Payer: Self-pay | Admitting: Internal Medicine

## 2020-08-28 ENCOUNTER — Other Ambulatory Visit: Payer: Self-pay | Admitting: Internal Medicine

## 2020-08-28 ENCOUNTER — Ambulatory Visit (INDEPENDENT_AMBULATORY_CARE_PROVIDER_SITE_OTHER): Payer: Medicare Other | Admitting: Internal Medicine

## 2020-08-28 ENCOUNTER — Other Ambulatory Visit: Payer: Self-pay

## 2020-08-28 VITALS — BP 120/82 | HR 64 | Temp 97.9°F | Ht 62.0 in | Wt 148.6 lb

## 2020-08-28 DIAGNOSIS — M1611 Unilateral primary osteoarthritis, right hip: Secondary | ICD-10-CM

## 2020-08-28 DIAGNOSIS — Z0000 Encounter for general adult medical examination without abnormal findings: Secondary | ICD-10-CM

## 2020-08-28 DIAGNOSIS — I10 Essential (primary) hypertension: Secondary | ICD-10-CM | POA: Diagnosis not present

## 2020-08-28 DIAGNOSIS — N390 Urinary tract infection, site not specified: Secondary | ICD-10-CM | POA: Diagnosis not present

## 2020-08-28 DIAGNOSIS — N3090 Cystitis, unspecified without hematuria: Secondary | ICD-10-CM | POA: Diagnosis not present

## 2020-08-28 DIAGNOSIS — E785 Hyperlipidemia, unspecified: Secondary | ICD-10-CM

## 2020-08-28 DIAGNOSIS — E034 Atrophy of thyroid (acquired): Secondary | ICD-10-CM

## 2020-08-28 DIAGNOSIS — F411 Generalized anxiety disorder: Secondary | ICD-10-CM

## 2020-08-28 MED ORDER — CITALOPRAM HYDROBROMIDE 20 MG PO TABS: 20.0000 mg | ORAL_TABLET | Freq: Every day | ORAL | 3 refills | Status: DC

## 2020-08-28 MED ORDER — LEVOTHYROXINE SODIUM 125 MCG PO TABS: 125.0000 ug | ORAL_TABLET | Freq: Every day | ORAL | 3 refills | Status: DC

## 2020-08-28 MED ORDER — METHOCARBAMOL 500 MG PO TABS: 500.0000 mg | ORAL_TABLET | Freq: Every day | ORAL | 0 refills | Status: DC | PRN

## 2020-08-28 MED ORDER — GABAPENTIN 100 MG PO CAPS: 100.0000 mg | ORAL_CAPSULE | Freq: Two times a day (BID) | ORAL | 3 refills | Status: DC

## 2020-08-28 NOTE — Assessment & Plan Note (Signed)
On NAS diet  

## 2020-08-28 NOTE — Assessment & Plan Note (Addendum)
S/p THR 2021 F/u w/Dr Rhona Raider

## 2020-08-28 NOTE — Progress Notes (Signed)
[place] Phone: [phone] Subjective:   [name], am serving as a scribe for Dr. Hulan Saas.  This visit occurred during the SARS-CoV-2 public health emergency.  Safety protocols were in place, including screening questions prior to the visit, additional usage of staff PPE, and extensive cleaning of exam room while observing appropriate contact time as indicated for disinfecting solutions.   I'm seeing this patient by the request  of:  Plotnikov, Evie Lacks, MD  CC: Left hip pain  [id number]  Ula Litasha Foulks is a 70 y.o. female coming in with complaint of hip and back pain. Last seen in November 2021. R hip replacement this year. Patient states that she is having L hip pain when she is cycling.   Xray 09/29/2019 Lumbar IMPRESSION: Diffuse severe degenerative disc disease throughout the lumbar spine.       Past Medical History:  Diagnosis Date   ALLERGIC RHINITIS    ANXIETY    GERD    INSOMNIA-SLEEP DISORDER-UNSPEC    Osteopenia 01/2016   T score -1.2 FRAX 15%/0.9%   Shingles    Thyroid disease    Past Surgical History:  Procedure Laterality Date   ANKLE FRACTURE SURGERY  2012   R   APPENDECTOMY     BREAST SURGERY     REDUCTION MAMMOPLASTY   CESAREAN SECTION     twice   COSMETIC SURGERY     FACELIFT     s/p   HIP SURGERY Right 12/08/2019   Patient reported   TONSILLECTOMY  1974   Upper nl TSH w/sx     VAGINAL HYSTERECTOMY  1991   TVH, irregular bleeding   Social History   Socioeconomic History   Marital status: Married    Spouse name: Not on file   Number of children: 2   Years of education: Not on file   Highest education level: Not on file  Occupational History   Occupation: Retired    Fish farm manager: SELF EMPLOYED  Tobacco Use   Smoking status: Never   Smokeless tobacco: Never  Vaping Use   Vaping Use: Never used  Substance and Sexual Activity   Alcohol use: Yes     Alcohol/week: 10.0 standard drinks    Types: 10 Glasses of wine per week   Drug use: No   Sexual activity: Yes    Birth control/protection: Post-menopausal, Surgical    Comment: HYST-1st intercourse 70 yo-Fewer than 5 partners  Other Topics Concern   Not on file  Social History Narrative   Regular exercise   2 biol children-1 disabled with schizophrenia & 1 stepchild   Social Determinants of Health   Financial Resource Strain: Not on file  Food Insecurity: Not on file  Transportation Needs: Not on file  Physical Activity: Not on file  Stress: Not on file  Social Connections: Not on file   Allergies  Allergen Reactions   Belviq [Lorcaserin Hcl] Other (See Comments)    palpitations   Demerol [Meperidine] Other (See Comments)    palpitations   Elavil [Amitriptyline Hcl] Other (See Comments)    constipation   Meperidine Hcl Other (See Comments)   Other Other (See Comments)    Other   Peanut Oil Other (See Comments)   Shellfish-Derived Products Other (See Comments)   Latex Hives, Itching and Rash    Redness    Medical Adhesive Remover Hives, Itching and Rash   Family History  Problem Relation Age  of Onset   Heart disease Father        CAD/CABG in his 56's   Hypertension Mother    Heart failure Mother     Current Outpatient Medications (Endocrine & Metabolic):    levothyroxine (SYNTHROID) 125 MCG tablet, Take 1 tablet (125 mcg total) by mouth daily.   Current Outpatient Medications (Respiratory):    benzonatate (TESSALON) 100 MG capsule, TAKE 1 TO 2 CAPSULES BY MOUTH THREE TIMES DAILY AS NEEDED    Current Outpatient Medications (Other):    Cholecalciferol (VITAMIN D3) 2000 units capsule, Take 1 capsule (2,000 Units total) by mouth daily.   citalopram (CELEXA) 20 MG tablet, Take 1 tablet (20 mg total) by mouth daily.   CRANBERRY EXTRACT PO, Take 1 capsule by mouth daily.    gabapentin (NEURONTIN) 100 MG capsule, Take 1 capsule (100 mg total) by mouth 2 (two)  times daily.   gabapentin (NEURONTIN) 300 MG capsule, TAKE 1 CAPSULE BY MOUTH THREE TIMES DAILY   methocarbamol (ROBAXIN) 500 MG tablet, Take 1 tablet (500 mg total) by mouth daily as needed for muscle spasms.   Multiple Vitamin (MULTIVITAMIN) tablet, Take 1 tablet by mouth daily. Life's Abundance   NON FORMULARY, Take 1 tablet by mouth daily. Allerfex   NON FORMULARY, Take 1 capsule by mouth daily. Tart cherry extract   pantoprazole (PROTONIX) 20 MG tablet, Take 1 tablet (20 mg total) by mouth daily.   traZODone (DESYREL) 50 MG tablet, Take 1 tablet (50 mg total) by mouth at bedtime.   Valerian Root 100 MG CAPS, Take 1 capsule by mouth daily.   zinc gluconate 50 MG tablet, Take 50 mg by mouth daily.   Reviewed prior external information including notes and imaging from  primary care provider As well as notes that were available from care everywhere and other healthcare systems.  Past medical history, social, surgical and family history all reviewed in electronic medical record.  No pertanent information unless stated regarding to the chief complaint.   Review of Systems:  No headache, visual changes, nausea, vomiting, diarrhea, constipation, dizziness, abdominal pain, skin rash, fevers, chills, night sweats, weight loss, swollen lymph nodes, body aches, joint swelling, chest pain, shortness of breath, mood changes. POSITIVE muscle aches  Objective  Blood pressure 120/88, pulse 65, height '5\' 2"'$  (1.575 m), SpO2 96 %.   General: No apparent distress alert and oriented x3 mood and affect normal, dressed appropriately.  HEENT: Pupils equal, extraocular movements intact  Respiratory: Patient's speak in full sentences and does not appear short of breath  Cardiovascular: No lower extremity edema, non tender, no erythema  Gait normal with good balance and coordination.  MSK: Left hip exam shows the patient does have some mild tenderness over the greater trochanteric area.  Negative straight leg  test.  Mild tightness with FABER test.  Mild limitation only in internal range of motion.  Defer evaluation of the contralateral side.   Procedure: Real-time Ultrasound Guided Injection of left  greater trochanteric bursitis secondary to patient's body habitus Device: GE Logiq Q7  Ultrasound guided injection is preferred based studies that show increased duration, increased effect, greater accuracy, decreased procedural pain, increased response rate, and decreased cost with ultrasound guided versus blind injection.  Verbal informed consent obtained.  Time-out conducted.  Noted no overlying erythema, induration, or other signs of local infection.  Skin prepped in a sterile fashion.  Local anesthesia: Topical Ethyl chloride.  With sterile technique and under real time ultrasound guidance:  Greater trochanteric  area was visualized and patient's bursa was noted. A 22-gauge 3 inch needle was inserted and 2 cc of 0.5% Marcaine and 1 cc of Kenalog 40 mg/dL was injected.  Completed without difficulty  Pain immediately resolved suggesting accurate placement of the medication.  Advised to call if fevers/chills, erythema, induration, drainage, or persistent bleeding.  Impression: Technically successful ultrasound guided injection.    Impression and Recommendations:    The above documentation has been reviewed and is accurate and complete Lyndal Pulley, DO

## 2020-08-28 NOTE — Assessment & Plan Note (Signed)
Cont w/LEVOTHROID 112 MCG/D

## 2020-08-28 NOTE — Assessment & Plan Note (Signed)
Recurrent - better on D-mannose

## 2020-08-28 NOTE — Assessment & Plan Note (Signed)
Citalopram  Potential benefits of a long term SSRI use as well as potential risks  and complications were explained to the patient and were aknowledged.

## 2020-08-28 NOTE — Progress Notes (Signed)
Subjective:  Patient ID: Ellen [name], female    DOB: 01-Dec-1950  Age: 70 y.o. MRN: [id number]  CC: Follow-up (6 month f/u)   HPI Ellen [name] presents for L hip pain (s/p R THR 9 months ago), hypothyroidism, UTIs Taking Gabapentin prn  Outpatient Medications Prior to Visit  Medication Sig Dispense Refill   benzonatate (TESSALON) 100 MG capsule TAKE 1 TO 2 CAPSULES BY MOUTH THREE TIMES DAILY AS NEEDED 60 capsule 0   Cholecalciferol (VITAMIN D3) 2000 units capsule Take 1 capsule (2,000 Units total) by mouth daily. 100 capsule 3   citalopram (CELEXA) 20 MG tablet Take 1 tablet (20 mg total) by mouth daily. 90 tablet 3   CRANBERRY EXTRACT PO Take 1 capsule by mouth daily.      Estradiol 10 MCG TABS vaginal tablet Place 1 tablet (10 mcg total) vaginally 2 (two) times a week. 8 tablet 9   fluticasone (FLONASE) 50 MCG/ACT nasal spray Place 2 sprays into both nostrils daily. 11.1 g 2   gabapentin (NEURONTIN) 100 MG capsule Take 1 capsule (100 mg total) by mouth 2 (two) times daily. 180 capsule 3   gabapentin (NEURONTIN) 300 MG capsule TAKE 1 CAPSULE BY MOUTH THREE TIMES DAILY 90 capsule 3   levothyroxine (SYNTHROID) 125 MCG tablet Take 1 tablet (125 mcg total) by mouth daily. 90 tablet 3   methocarbamol (ROBAXIN) 500 MG tablet Take 1 tablet (500 mg total) by mouth daily as needed for muscle spasms. 90 tablet 0   Multiple Vitamin (MULTIVITAMIN) tablet Take 1 tablet by mouth daily. Life's Abundance     NON FORMULARY Take 1 tablet by mouth daily. Allerfex     NON FORMULARY Take 1 capsule by mouth daily. Tart cherry extract     pantoprazole (PROTONIX) 20 MG tablet Take 1 tablet (20 mg total) by mouth daily. 90 tablet 3   traZODone (DESYREL) 50 MG tablet Take 1 tablet (50 mg total) by mouth at bedtime. 90 tablet 3   Valerian Root 100 MG CAPS Take 1 capsule by mouth daily.     zinc gluconate 50 MG tablet Take 50 mg by mouth daily.     ciprofloxacin (CIPRO) 250 MG tablet Take 1 tablet (250  mg total) by mouth 2 (two) times daily. 6 tablet 0   phenazopyridine (PYRIDIUM) 100 MG tablet Take 1 tablet (100 mg total) by mouth 3 (three) times daily as needed for pain (urinary burning). 30 tablet 1   No facility-administered medications prior to visit.    ROS: Review of Systems  Constitutional:  Negative for activity change, appetite change, chills, fatigue and unexpected weight change.  HENT:  Negative for congestion, mouth sores and sinus pressure.   Eyes:  Negative for visual disturbance.  Respiratory:  Negative for cough and chest tightness.   Gastrointestinal:  Negative for abdominal pain and nausea.  Genitourinary:  Negative for difficulty urinating, frequency and vaginal pain.  Musculoskeletal:  Positive for arthralgias. Negative for back pain and gait problem.  Skin:  Negative for pallor and rash.  Neurological:  Negative for dizziness, tremors, weakness, numbness and headaches.  Psychiatric/Behavioral:  Negative for confusion, sleep disturbance and suicidal ideas.    Objective:  BP 120/82 (BP Location: Left Arm)   Pulse 64   Temp 97.9 F (36.6 C) (Oral)   Ht '5\' 2"'$  (1.575 m)   Wt 148 lb 9.6 oz (67.4 kg)   SpO2 97%   BMI 27.18 kg/m   BP Readings from Last 3 Encounters:  08/28/20 120/82  03/13/20 122/82  02/29/20 128/80    Wt Readings from Last 3 Encounters:  08/28/20 148 lb 9.6 oz (67.4 kg)  02/29/20 147 lb 12.8 oz (67 kg)  02/15/20 148 lb 6.4 oz (67.3 kg)    Physical Exam Constitutional:      General: She is not in acute distress.    Appearance: She is well-developed.  HENT:     Head: Normocephalic.     Right Ear: External ear normal.     Left Ear: External ear normal.     Nose: Nose normal.  Eyes:     General:        Right eye: No discharge.        Left eye: No discharge.     Conjunctiva/sclera: Conjunctivae normal.     Pupils: Pupils are equal, round, and reactive to light.  Neck:     Thyroid: No thyromegaly.     Vascular: No JVD.      Trachea: No tracheal deviation.  Cardiovascular:     Rate and Rhythm: Normal rate and regular rhythm.     Heart sounds: Normal heart sounds.  Pulmonary:     Effort: No respiratory distress.     Breath sounds: No stridor. No wheezing.  Abdominal:     General: Bowel sounds are normal. There is no distension.     Palpations: Abdomen is soft. There is no mass.     Tenderness: There is no abdominal tenderness. There is no guarding or rebound.  Musculoskeletal:        General: Tenderness present.     Cervical back: Normal range of motion and neck supple. No rigidity.  Lymphadenopathy:     Cervical: No cervical adenopathy.  Skin:    Findings: No erythema or rash.  Neurological:     Cranial Nerves: No cranial nerve deficit.     Motor: No abnormal muscle tone.     Coordination: Coordination normal.     Deep Tendon Reflexes: Reflexes normal.  Psychiatric:        Behavior: Behavior normal.        Thought Content: Thought content normal.        Judgment: Judgment normal.  L HIP W/PAIN on ROM  Lab Results  Component Value Date   WBC 4.7 02/29/2020   HGB 12.3 02/29/2020   HCT 38.1 02/29/2020   PLT 208.0 02/29/2020   GLUCOSE 88 02/29/2020   CHOL 226 (H) 02/29/2020   TRIG 100.0 02/29/2020   HDL 77.50 02/29/2020   LDLDIRECT 142.6 07/09/2011   LDLCALC 128 (H) 02/29/2020   ALT 12 02/29/2020   AST 19 02/29/2020   NA 140 02/29/2020   K 4.2 02/29/2020   CL 103 02/29/2020   CREATININE 0.69 02/29/2020   BUN 17 02/29/2020   CO2 30 02/29/2020   TSH 2.26 02/29/2020   HGBA1C 5.6 10/12/2006    DG Hip Unilat W or Wo Pelvis 2-3 Views Right  Result Date: 12/09/2019 CLINICAL DATA:  Fall.  Right hip pain. EXAM: DG HIP (WITH OR WITHOUT PELVIS) 2-3V RIGHT COMPARISON:  MRI 10/09/2019.  Right hip series 09/29/2019. FINDINGS: Total right hip replacement. Hardware intact. Anatomic alignment. Degenerative changes lumbar spine and left hip. No acute abnormality identified. IMPRESSION: Total right hip  replacement. Hardware intact. Anatomic alignment. No acute abnormality. Electronically Signed   By: Marcello Moores  Register   On: 12/09/2019 08:47    Assessment & Plan:    Walker Kehr, MD

## 2020-08-28 NOTE — Assessment & Plan Note (Signed)
Better on D-mannose OTC Use bathroom more often

## 2020-08-29 ENCOUNTER — Ambulatory Visit: Payer: Medicare Other | Admitting: Family Medicine

## 2020-08-29 ENCOUNTER — Ambulatory Visit: Payer: Self-pay

## 2020-08-29 ENCOUNTER — Encounter: Payer: Self-pay | Admitting: Family Medicine

## 2020-08-29 VITALS — BP 120/88 | HR 65 | Ht 62.0 in

## 2020-08-29 DIAGNOSIS — M7062 Trochanteric bursitis, left hip: Secondary | ICD-10-CM

## 2020-08-29 DIAGNOSIS — M25552 Pain in left hip: Secondary | ICD-10-CM

## 2020-08-29 NOTE — Assessment & Plan Note (Signed)
Patient given injection today and tolerated procedure well.  Discussed icing regimen and home exercises.  Increase activity slowly.  Patient has been doing relatively well.  Concern for some mild underlying arthritic changes.  Follow-up with me again in 3 months.

## 2020-08-29 NOTE — Patient Instructions (Signed)
See me when you need me Injection today will hopefully help

## 2020-08-30 ENCOUNTER — Encounter: Payer: Self-pay | Admitting: Internal Medicine

## 2020-08-31 ENCOUNTER — Telehealth: Payer: Self-pay | Admitting: Family Medicine

## 2020-08-31 NOTE — Telephone Encounter (Signed)
Patient called stating that after her appointment and injection, the outside of her left hip feels much better. She is still having a lot of pain on the inner back side. She was out on a 12 mile bike ride and was having a hard time due to the pain. Patient asked if she would be able to have another Cortizone injection in that area to relieve the pain. She is going to [place] on Sept 7th for a [place] bike ride and wanted to make sure her hip felt better before that.  Please advise.

## 2020-09-03 ENCOUNTER — Other Ambulatory Visit: Payer: Self-pay | Admitting: Internal Medicine

## 2020-09-03 NOTE — Telephone Encounter (Signed)
Patient would like to try the prednisone. She asked that this be sent to [facility] on [address].

## 2020-09-04 ENCOUNTER — Ambulatory Visit: Payer: Medicare Other | Admitting: Family Medicine

## 2020-09-04 MED ORDER — PREDNISONE 20 MG PO TABS: 40.0000 mg | ORAL_TABLET | Freq: Every day | ORAL | 0 refills | Status: DC

## 2020-09-04 NOTE — Telephone Encounter (Signed)
Left message informing patient.

## 2020-09-04 NOTE — Telephone Encounter (Signed)
Sent in prednisone 40 mg daily for 5 days to help with some of the discomfort and pain.

## 2020-09-15 ENCOUNTER — Encounter: Payer: Self-pay | Admitting: Family Medicine

## 2020-09-17 ENCOUNTER — Other Ambulatory Visit: Payer: Self-pay

## 2020-09-17 DIAGNOSIS — M545 Low back pain, unspecified: Secondary | ICD-10-CM

## 2020-09-17 DIAGNOSIS — M25552 Pain in left hip: Secondary | ICD-10-CM

## 2020-09-17 NOTE — Telephone Encounter (Signed)
Patient called stating that [place] first available appointment was too far out. She asked if the order could be sent [facility]?  Please advise.

## 2020-09-22 ENCOUNTER — Other Ambulatory Visit: Payer: Medicare Other

## 2020-09-22 ENCOUNTER — Ambulatory Visit (INDEPENDENT_AMBULATORY_CARE_PROVIDER_SITE_OTHER): Payer: Medicare Other

## 2020-09-22 ENCOUNTER — Other Ambulatory Visit: Payer: Self-pay

## 2020-09-22 DIAGNOSIS — M25452 Effusion, left hip: Secondary | ICD-10-CM | POA: Diagnosis not present

## 2020-09-22 DIAGNOSIS — M48061 Spinal stenosis, lumbar region without neurogenic claudication: Secondary | ICD-10-CM | POA: Diagnosis not present

## 2020-09-22 DIAGNOSIS — M5126 Other intervertebral disc displacement, lumbar region: Secondary | ICD-10-CM | POA: Diagnosis not present

## 2020-09-22 DIAGNOSIS — M545 Low back pain, unspecified: Secondary | ICD-10-CM | POA: Diagnosis not present

## 2020-09-22 DIAGNOSIS — M79605 Pain in left leg: Secondary | ICD-10-CM | POA: Diagnosis not present

## 2020-09-22 DIAGNOSIS — R600 Localized edema: Secondary | ICD-10-CM | POA: Diagnosis not present

## 2020-09-22 DIAGNOSIS — M25552 Pain in left hip: Secondary | ICD-10-CM

## 2020-09-22 DIAGNOSIS — M1612 Unilateral primary osteoarthritis, left hip: Secondary | ICD-10-CM | POA: Diagnosis not present

## 2020-09-22 IMAGING — MR MR HIP*L* W/O CM
4 of 5 series · 19 of 40 positions shown · non-contrast
Comparison: MRI [DATE]

CLINICAL DATA: Left hip pain for 3 weeks

EXAM:
MR OF THE LEFT HIP WITHOUT CONTRAST
TECHNIQUE: Multiplanar, multisequence MR imaging was performed. No intravenous
contrast was administered.

[Series 2: T1 · coronal · 4.0mm · 0.66mm/px · 3 of 38 slices shown]
[im 5/38]
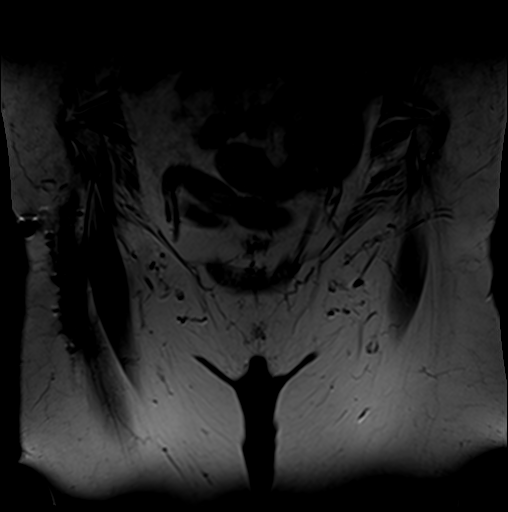
[im 19/38]
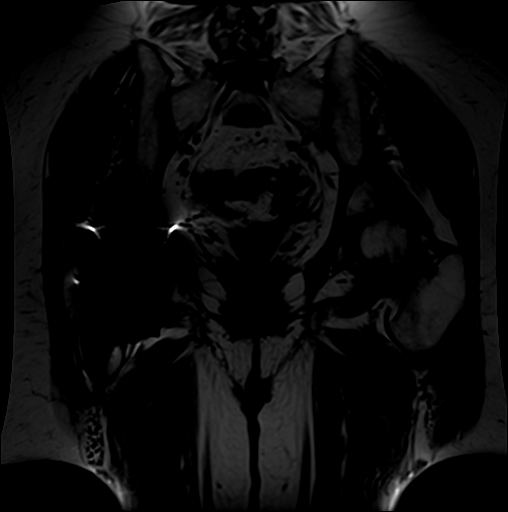
[im 33/38]
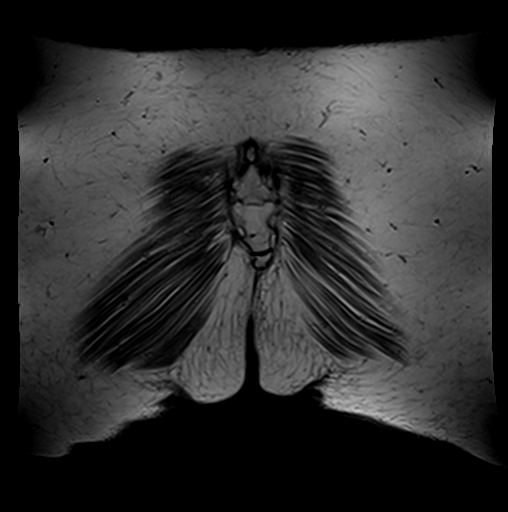

[Series 3: STIR · coronal · 4.0mm · 0.66mm/px · 3 of 38 slices shown]
[im 5/38]
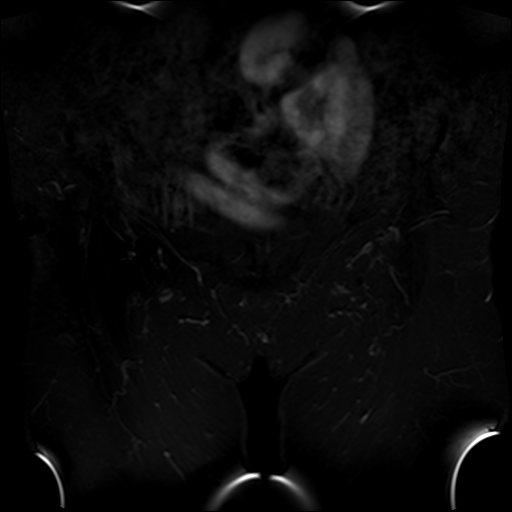
[im 19/38]
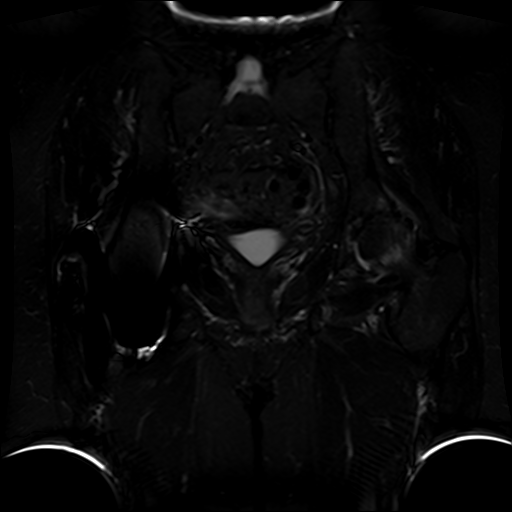
[im 33/38]
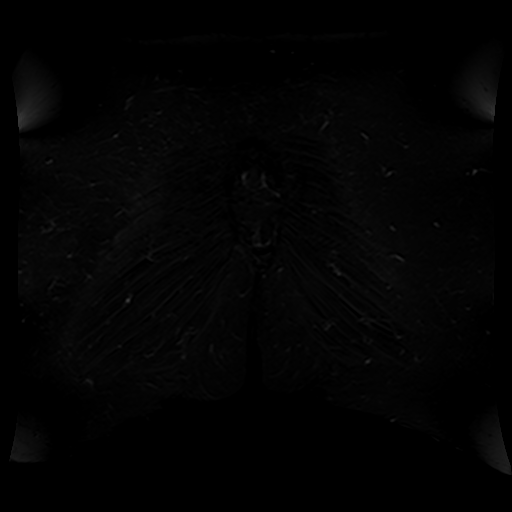

[Series 6: PD fat-sat · coronal · 4.5mm · 0.35mm/px · 6 of 23 slices shown (1 of 2)]
[im 1/23]
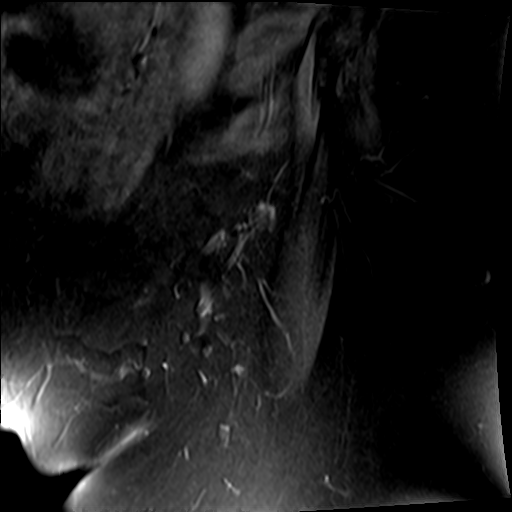
[im 5/23]
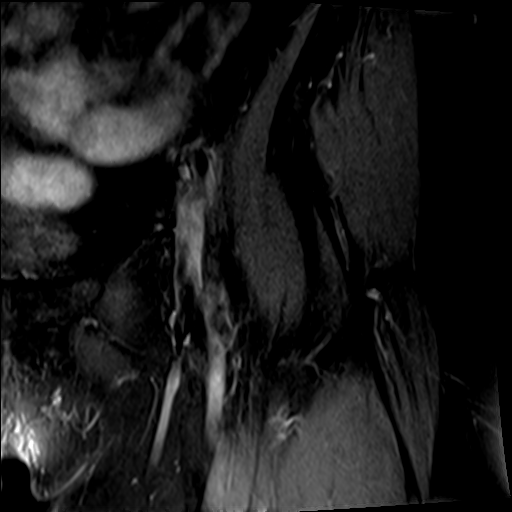
[im 9/23]
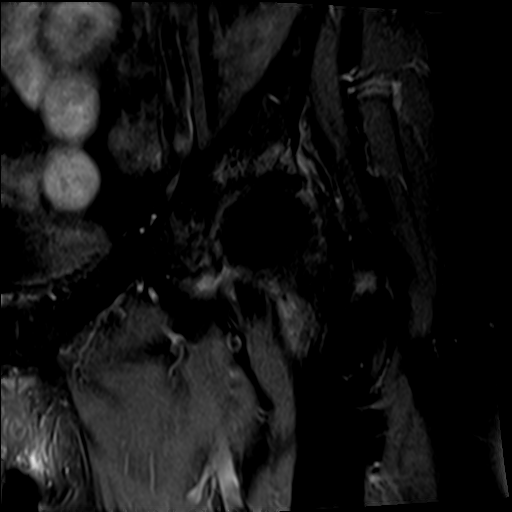
[im 14/23]
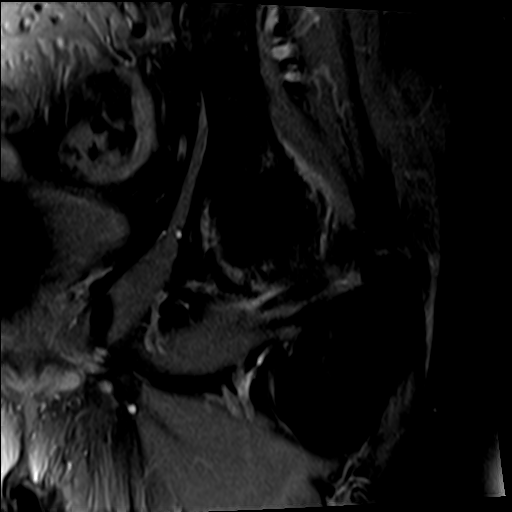
[im 18/23]
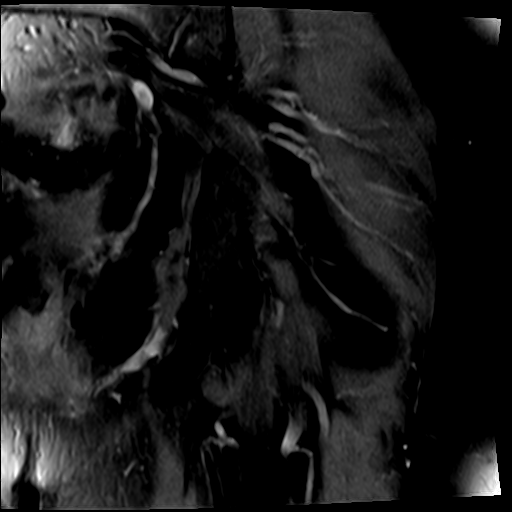
[im 23/23]
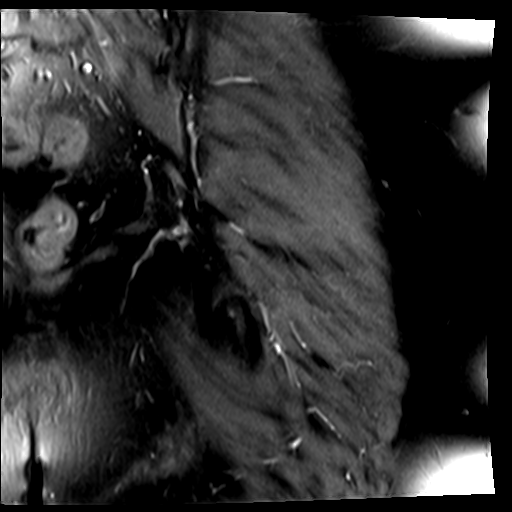

[Series 7: PD fat-sat · sagittal · 4.0mm · 0.35mm/px · 7 of 26 slices shown (2 of 2)]
[im 1/26]
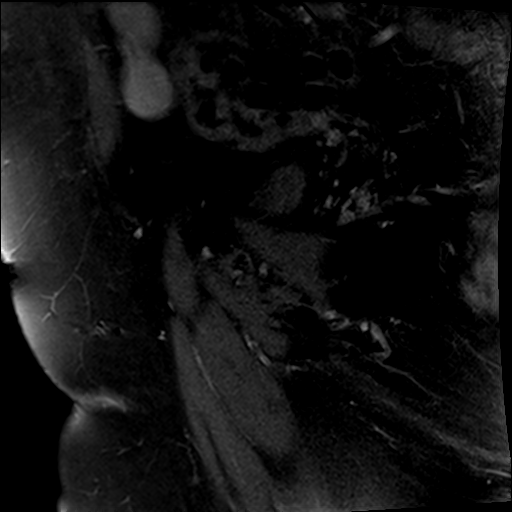
[im 5/26]
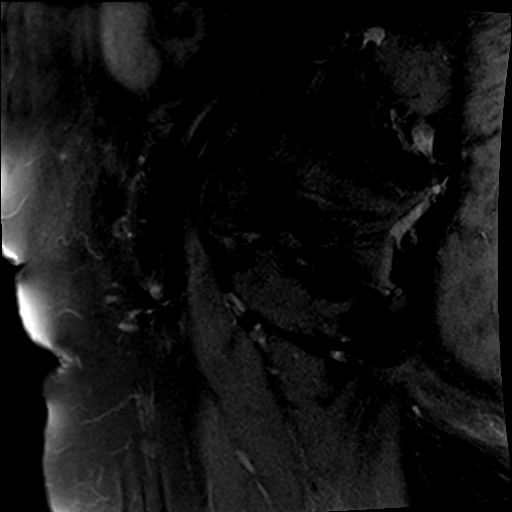
[im 9/26]
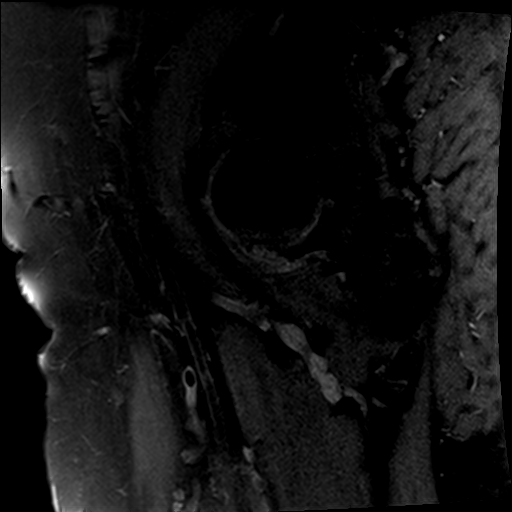
[im 13/26]
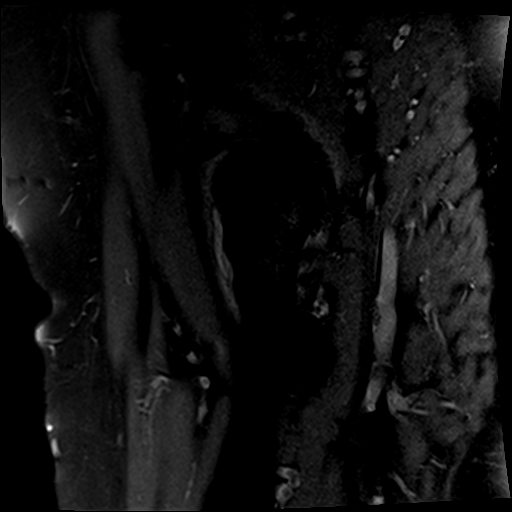
[im 17/26]
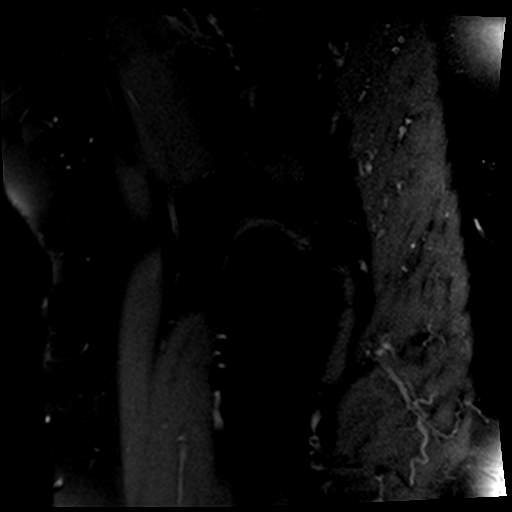
[im 21/26]
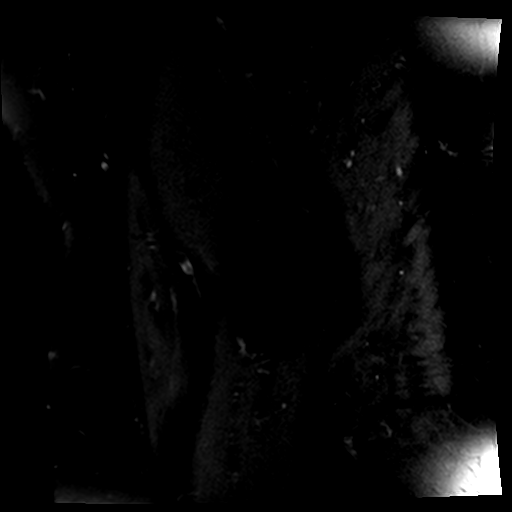
[im 26/26]
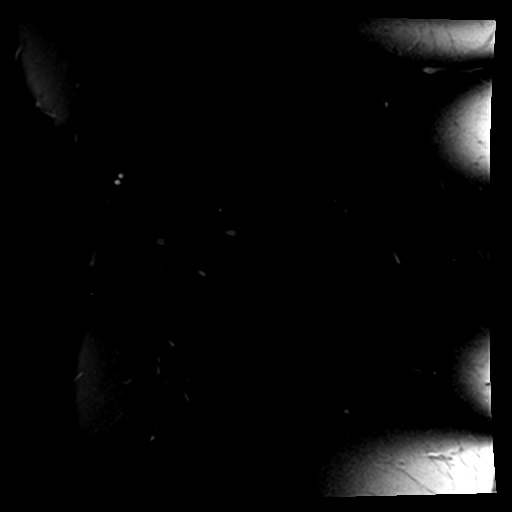

[19 of 40 positions shown; findings below may reference images not displayed]

FINDINGS: Bones: Interval postsurgical changes from right total hip
arthroplasty with associated susceptibility artifact. No evidence
of acute fracture. No dislocation. No avascular necrosis of the left
femoral head. Prominent subchondral marrow edema within the left
femoral head and acetabulum, increased from prior. Discogenic
endplate marrow changes within the visualized lumbar spine.
Elsewhere, no bone marrow edema. No marrow replacing bone lesion.

Articular cartilage and labrum

Articular cartilage: Extensive full-thickness cartilage loss of the
left femoral head and acetabulum, significantly progressed from
prior. Prominent subchondral marrow edema along both sides of the
joint.

Labrum:  Diffuse labral degeneration.

Joint or bursal effusion

Joint effusion:  Trace left hip joint effusion.

Bursae: No abnormal bursal fluid collection.

Muscles and tendons

Muscles and tendons: The gluteal, hamstring, iliopsoas, rectus
femoris, and adductor tendons appear intact without tear or
significant tendinosis. Atrophy of the right gluteus minimus muscle.
Otherwise, normal muscle bulk and signal intensity without edema,
atrophy, or fatty infiltration.

Other findings

Miscellaneous: No soft tissue edema or fluid collection. No inguinal
lymphadenopathy. Prominent sigmoid diverticulosis.
IMPRESSION: Severe left hip osteoarthritis, significantly progressed from prior
MRI.

## 2020-09-22 IMAGING — MR MR LUMBAR SPINE W/O CM
4 of 5 series · 20 of 48 positions shown · non-contrast
Comparison: None similar

CLINICAL DATA: Low back pain with left thigh pain for 3 weeks

EXAM:
MRI LUMBAR SPINE WITHOUT CONTRAST
TECHNIQUE: Multiplanar, multisequence MR imaging of the lumbar spine was
performed. No intravenous contrast was administered.

[Series 2: T2 · sagittal · 4.0mm · 0.41mm/px · 6 of 17 slices shown (1 of 2)]
[im 1/17]
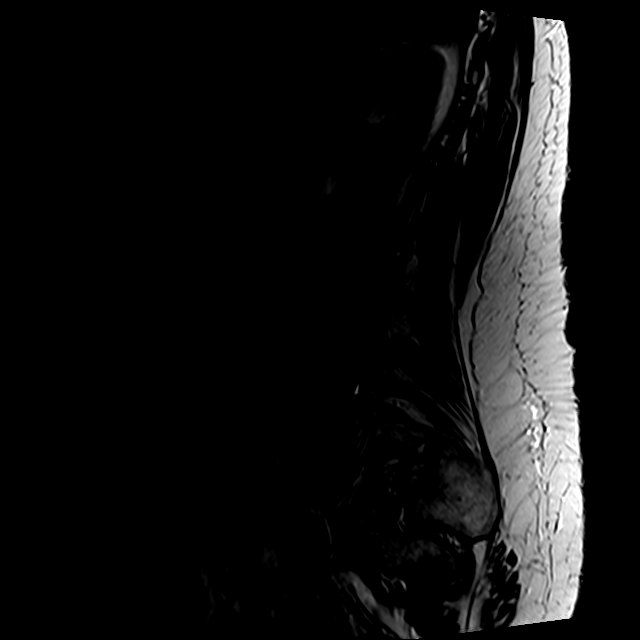
[im 4/17]
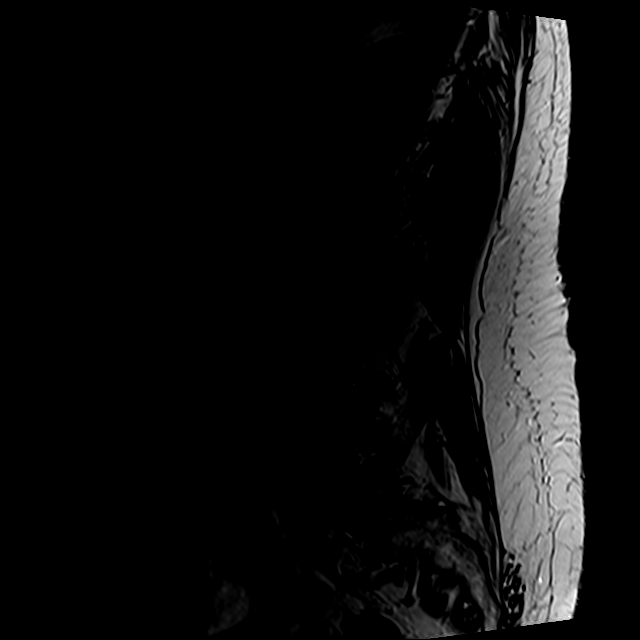
[im 7/17]
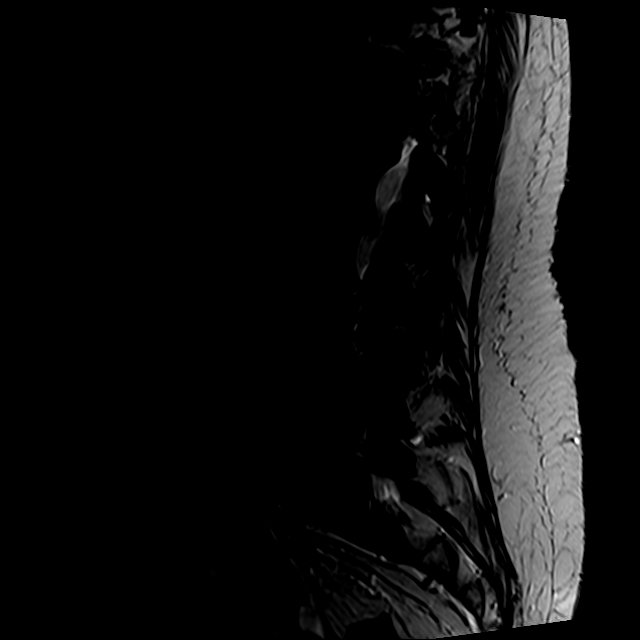
[im 10/17]
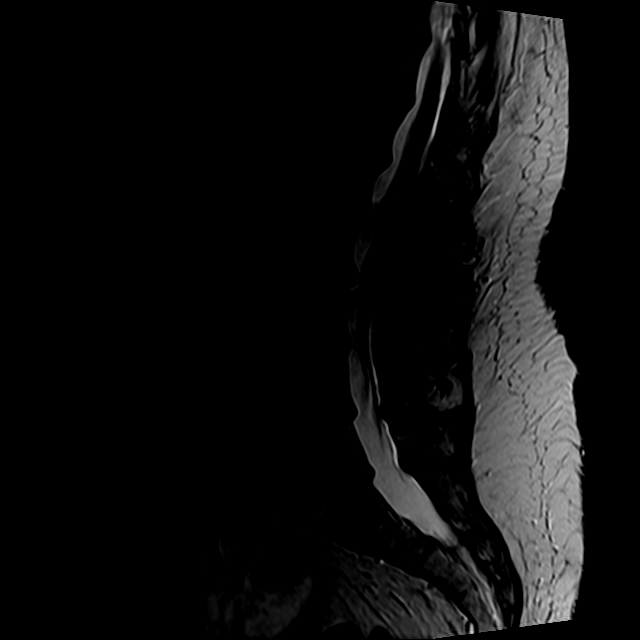
[im 13/17]
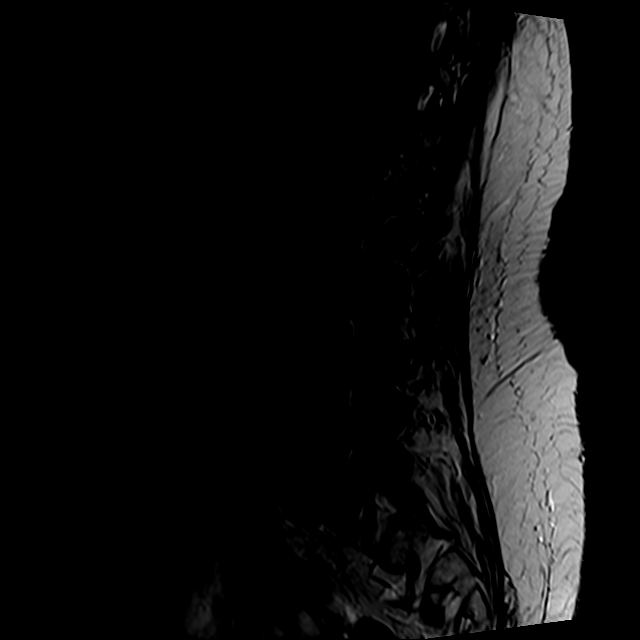
[im 17/17]
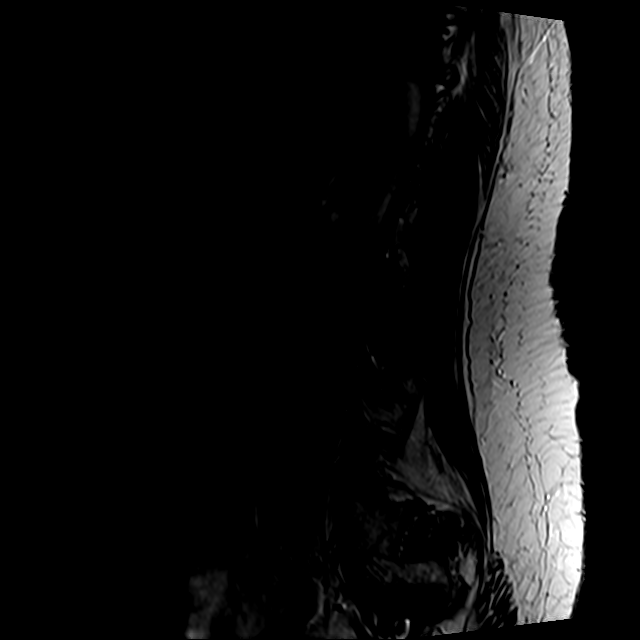

[Series 3: T1 · sagittal · 4.0mm · 0.41mm/px · 3 of 17 slices shown (1 of 2)]
[im 3/17]
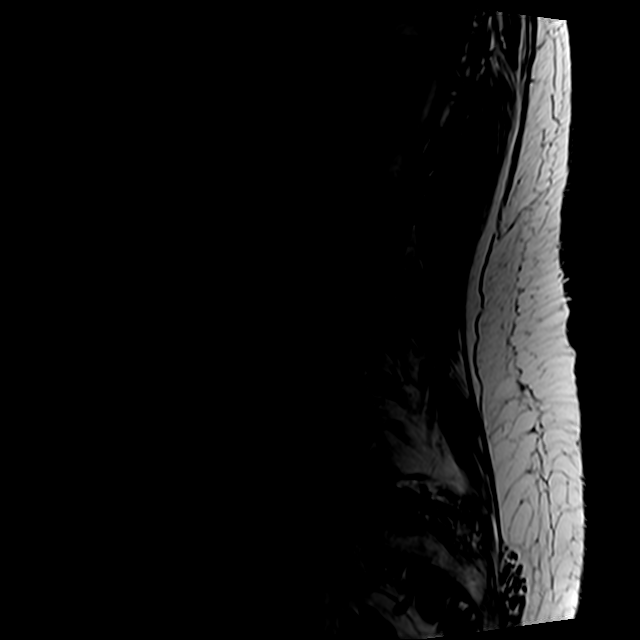
[im 9/17]
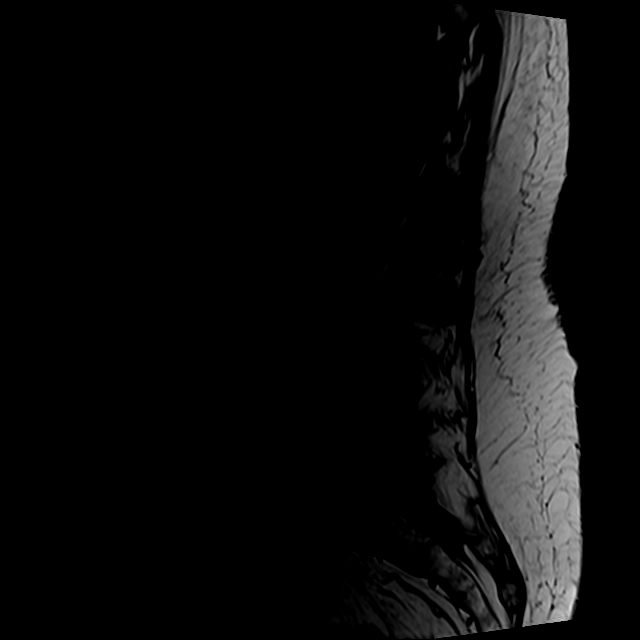
[im 14/17]
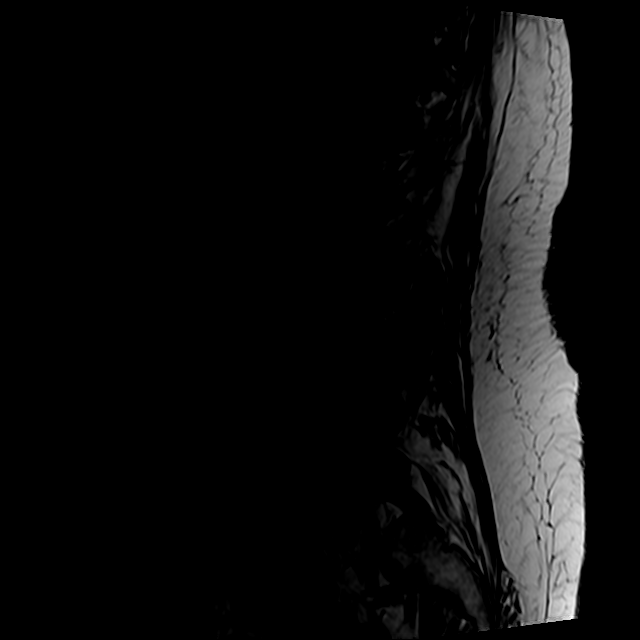

[Series 5: T2 · axial · 4.0mm · 0.78mm/px · z∈[-72,+123]mm · 8 of 35 slices shown (2 of 2)]
[im 1/35]
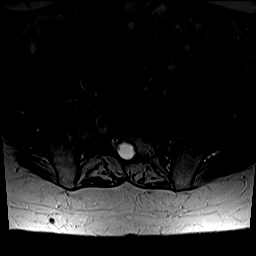
[im 6/35]
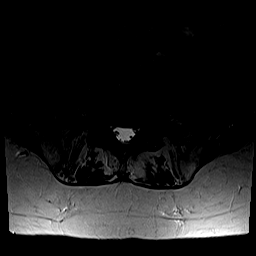
[im 11/35]
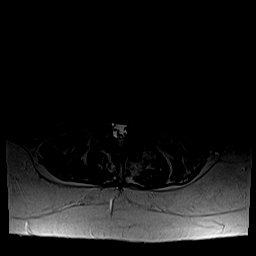
[im 16/35]
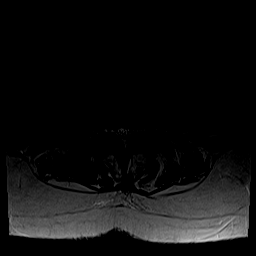
[im 19/35]
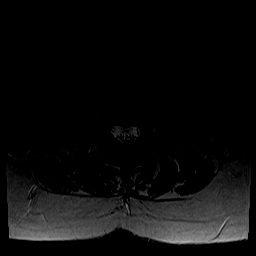
[im 24/35]
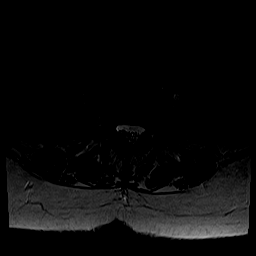
[im 29/35]
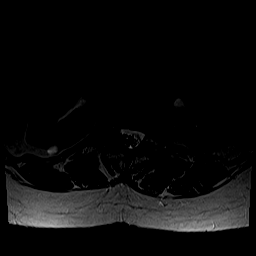
[im 35/35]
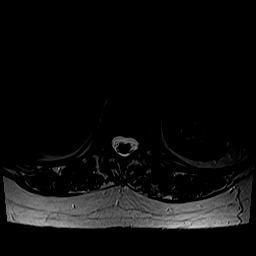

[Series 6: T1 · axial · 4.0mm · 0.39mm/px · z∈[-47,+94]mm · 3 of 35 slices shown (2 of 2)]
[im 6/35]
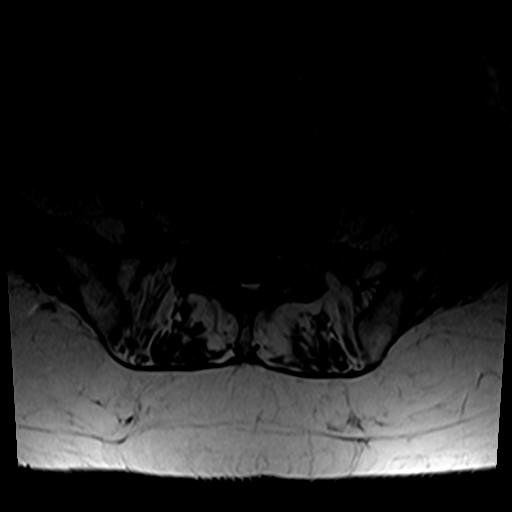
[im 19/35]
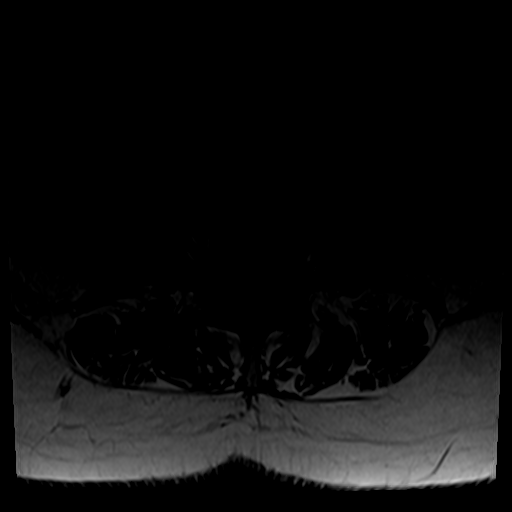
[im 29/35]
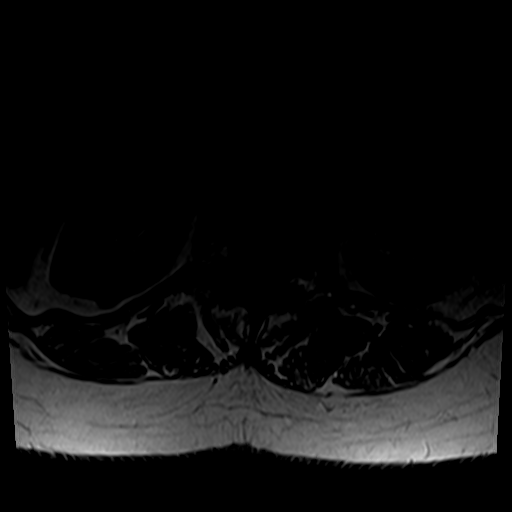

[20 of 48 positions shown; findings below may reference images not displayed]

FINDINGS: Segmentation: 5 lumbar type vertebrae based on the lowest ribs by
prior radiography.

Alignment:  Levoscoliosis.  No significant listhesis.

Vertebrae:  No fracture, evidence of discitis, or bone lesion.

Conus medullaris and cauda equina: Conus extends to the L1-2 level.
Conus and cauda equina appear normal.

Paraspinal and other soft tissues: Negative for perispinal mass or
inflammation.

Disc levels:

T12- L1: Disc narrowing and bulging with downward pointing central
protrusion. Negative facets.

L1-L2: Disc narrowing and endplate degeneration with eccentric right
disc bulging and ridging. Moderate thecal sac narrowing. Moderate
right foraminal narrowing

L2-L3: Disc narrowing and bulging with endplate ridging. Ligamentum
flavum thickening. Mild spinal stenosis

L3-L4: Disc narrowing and bulging. Degenerative facet spurring
asymmetric to the left. Moderate spinal stenosis. Patent foramina

L4-L5: Asymmetric leftward disc collapse, bulging, and ridging.
Asymmetric left facet spurring. Moderate left foraminal narrowing.
Patent spinal canal

L5-S1:Disc narrowing and endplate degeneration with circumferential
bulge and ridging. Right more than left facet spurring. No
compressive stenosis.
IMPRESSION: 1. Generalized lumbar spine degeneration with levoscoliosis.
2. Up to moderate spinal stenosis at L3-4.
3. Up to moderate foraminal narrowing on the left at L4-5 and right
at L1-2.

## 2020-09-24 ENCOUNTER — Encounter: Payer: Self-pay | Admitting: Family Medicine

## 2020-09-26 ENCOUNTER — Other Ambulatory Visit: Payer: Medicare Other

## 2020-09-26 ENCOUNTER — Encounter: Payer: Self-pay | Admitting: Family Medicine

## 2020-10-03 DIAGNOSIS — M1612 Unilateral primary osteoarthritis, left hip: Secondary | ICD-10-CM | POA: Diagnosis not present

## 2020-10-08 DIAGNOSIS — H04123 Dry eye syndrome of bilateral lacrimal glands: Secondary | ICD-10-CM | POA: Diagnosis not present

## 2020-10-08 DIAGNOSIS — H43813 Vitreous degeneration, bilateral: Secondary | ICD-10-CM | POA: Diagnosis not present

## 2020-10-08 DIAGNOSIS — H25813 Combined forms of age-related cataract, bilateral: Secondary | ICD-10-CM | POA: Diagnosis not present

## 2020-10-09 NOTE — Progress Notes (Signed)
Pt. Needs orders for surgery.PAT and labs 10/11/20.

## 2020-10-09 NOTE — Patient Instructions (Addendum)
DUE TO COVID-19 ONLY ONE VISITOR IS ALLOWED TO COME WITH YOU AND STAY IN THE WAITING ROOM ONLY DURING PRE OP AND PROCEDURE.   **NO VISITORS ARE ALLOWED IN THE SHORT STAY AREA OR RECOVERY ROOM!!**  IF YOU WILL BE ADMITTED INTO THE HOSPITAL YOU ARE ALLOWED ONLY TWO SUPPORT PEOPLE DURING VISITATION HOURS ONLY (10AM -8PM)   The support person(s) may change daily. The support person(s) must pass our screening, gel in and out, and wear a mask at all times, including in the patient's room. Patients must also wear a mask when staff or their support person are in the room.  No visitors under the age of 72. Any visitor under the age of 49 must be accompanied by an adult.    COVID SWAB TESTING MUST BE COMPLETED ON:  10/12/20 **MUST PRESENT COMPLETED FORM AT TESTING SITE**    [place]  [place] (backside of the building) You are not required to quarantine, however you are required to wear a well-fitted mask when you are out and around people not in your household.  Hand Hygiene often Do NOT share personal items Notify your provider if you are in close contact with someone who has COVID or you develop fever 100.4 or greater, new onset of sneezing, cough, sore throat, shortness of breath or body aches.  [place], [address], must go inside of the hospital, NOT A DRIVE THRU!  (Must self quarantine after testing. Follow instructions on handout.)       Your procedure is scheduled on: 10/15/20   Report to [facility] Main Entrance    Report to admitting at : 9:45 AM   Call this number if you have problems the morning of surgery [phone]   Do not eat food :After Midnight.   May have liquids until : 9:30 AM   day of surgery  CLEAR LIQUID DIET  Foods Allowed                                                                     Foods Excluded  Water, Black Coffee and tea, regular and decaf                              liquids that you cannot  Plain Jell-O in any flavor  (No red)                                           see through such as: Fruit ices (not with fruit pulp)                                     milk, soups, orange juice              Iced Popsicles (No red)  All solid food                                   Apple juices Sports drinks like Gatorade (No red) Lightly seasoned clear broth or consume(fat free) Sugar,   Sample Menu Breakfast                                Lunch                                     Supper Cranberry juice                    Beef broth                            Chicken broth Jell-O                                     Grape juice                           Apple juice Coffee or tea                        Jell-O                                      Popsicle                                                Coffee or tea                        Coffee or tea    Oral Hygiene is also important to reduce your risk of infection.                                    Remember - BRUSH YOUR TEETH THE MORNING OF SURGERY WITH YOUR REGULAR TOOTHPASTE   Do NOT smoke after Midnight   Take these medicines the morning of surgery with A SIP OF WATER: citalopram,allegra,synthroid,pantoprazole.  DO NOT TAKE ANY ORAL DIABETIC MEDICATIONS DAY OF YOUR SURGERY                              You may not have any metal on your body including hair pins, jewelry, and body piercing             Do not wear make-up, lotions, powders, perfumes/cologne, or deodorant  Do not wear nail polish including gel and S&S, artificial/acrylic nails, or any other type of covering on natural nails including finger and toenails. If you have artificial nails, gel coating, etc. that needs to be removed by a nail salon please have this removed prior to surgery or surgery  may need to be canceled/ delayed if the surgeon/ anesthesia feels like they are unable to be safely monitored.    Do not shave  48 hours prior to surgery.    Do not bring valuables to the hospital. [place].   Contacts, dentures or bridgework may not be worn into surgery.   Bring small overnight bag day of surgery.    Patients discharged on the day of surgery will not be allowed to drive home.   Special Instructions: Bring a copy of your healthcare power of attorney and living will documents         the day of surgery if you haven't scanned them before.              Please read over the following fact sheets you were given: IF YOU HAVE QUESTIONS ABOUT YOUR PRE-OP INSTRUCTIONS PLEASE CALL [phone]   [facility] - Preparing for Surgery Before surgery, you can play an important role.  Because skin is not sterile, your skin needs to be as free of germs as possible.  You can reduce the number of germs on your skin by washing with CHG (chlorahexidine gluconate) soap before surgery.  CHG is an antiseptic cleaner which kills germs and bonds with the skin to continue killing germs even after washing. Please DO NOT use if you have an allergy to CHG or antibacterial soaps.  If your skin becomes reddened/irritated stop using the CHG and inform your nurse when you arrive at Short Stay. Do not shave (including legs and underarms) for at least 48 hours prior to the first CHG shower.  You may shave your face/neck. Please follow these instructions carefully:  1.  Shower with CHG Soap the night before surgery and the  morning of Surgery.  2.  If you choose to wash your hair, wash your hair first as usual with your  normal  shampoo.  3.  After you shampoo, rinse your hair and body thoroughly to remove the  shampoo.                           4.  Use CHG as you would any other liquid soap.  You can apply chg directly  to the skin and wash                       Gently with a scrungie or clean washcloth.  5.  Apply the CHG Soap to your body ONLY FROM THE NECK DOWN.   Do  not use on face/ open                           Wound or open sores. Avoid contact with eyes, ears mouth and genitals (private parts).                       Wash face,  Genitals (private parts) with your normal soap.             6.  Wash thoroughly, paying special attention to the area where your surgery  will be performed.  7.  Thoroughly rinse your body with warm water from the neck down.  8.  DO NOT shower/wash with your normal soap after using and rinsing off  the CHG Soap.  9.  Pat yourself dry with a clean towel.            10.  Wear clean pajamas.            11.  Place clean sheets on your bed the night of your first shower and do not  sleep with pets. Day of Surgery : Do not apply any lotions/deodorants the morning of surgery.  Please wear clean clothes to the hospital/surgery center.  FAILURE TO FOLLOW THESE INSTRUCTIONS MAY RESULT IN THE CANCELLATION OF YOUR SURGERY PATIENT SIGNATURE_________________________________  NURSE SIGNATURE__________________________________  ________________________________________________________________________

## 2020-10-09 NOTE — Care Plan (Signed)
Ortho Bundle Case Management Note  Patient Details  Name: Ellen [name] MRN: [id number] Date of Birth: [birth year]   Spoke with patient prior to surgery. SHe will discharge to home with family to assist. Has all needed equipment from prior surgery. OPPT set up with [place]. Patient and MD in agreement with plan. Choice offered.                   DME Arranged:    DME Agency:     HH Arranged:    HH Agency:     Additional Comments: Please contact me with any questions of if this plan should need to change.  Ladell Heads,  [facility]  [phone] 10/09/2020, 9:57 AM

## 2020-10-11 ENCOUNTER — Encounter (HOSPITAL_COMMUNITY): Payer: Self-pay

## 2020-10-11 ENCOUNTER — Other Ambulatory Visit: Payer: Self-pay

## 2020-10-11 ENCOUNTER — Encounter: Payer: Self-pay | Admitting: Family Medicine

## 2020-10-11 ENCOUNTER — Encounter (HOSPITAL_COMMUNITY)
Admission: RE | Admit: 2020-10-11 | Discharge: 2020-10-11 | Disposition: A | Discharge status: Home / Self Care | Payer: Medicare Other | Source: Ambulatory Visit | Attending: Orthopaedic Surgery | Admitting: Orthopaedic Surgery

## 2020-10-11 DIAGNOSIS — Z79899 Other long term (current) drug therapy: Secondary | ICD-10-CM | POA: Insufficient documentation

## 2020-10-11 DIAGNOSIS — Z01812 Encounter for preprocedural laboratory examination: Secondary | ICD-10-CM | POA: Insufficient documentation

## 2020-10-11 HISTORY — DX: Hypothyroidism, unspecified: E03.9

## 2020-10-11 HISTORY — DX: Unspecified osteoarthritis, unspecified site: M19.90

## 2020-10-11 LAB — CBC
HCT: 40.5 % (ref 36.0–46.0)
Hemoglobin: 13.4 g/dL (ref 12.0–15.0)
MCH: 32 pg (ref 26.0–34.0)
MCHC: 33.1 g/dL (ref 30.0–36.0)
MCV: 96.7 fL (ref 80.0–100.0)
Platelets: 251 10*3/uL (ref 150–400)
RBC: 4.19 MIL/uL (ref 3.87–5.11)
RDW: 14.1 % (ref 11.5–15.5)
WBC: 5.2 10*3/uL (ref 4.0–10.5)
nRBC: 0 % (ref 0.0–0.2)

## 2020-10-11 LAB — SURGICAL PCR SCREEN
MRSA, PCR: NEGATIVE
Staphylococcus aureus: NEGATIVE

## 2020-10-11 NOTE — Progress Notes (Addendum)
COVID Vaccine Completed: Yes Date COVID Vaccine completed: 10/2020 COVID vaccine manufacturer: [place]  x 5  COVID Test: 10/12/20 PCP - Dr. Lew Dawes Cardiologist -   Chest x-ray -  EKG -  Stress Test -  ECHO -  Cardiac Cath -  Pacemaker/ICD device last checked:  Sleep Study -  CPAP -   Fasting Blood Sugar -  Checks Blood Sugar _____ times a day  Blood Thinner Instructions: Aspirin Instructions: Last Dose:  Anesthesia review:   Patient denies shortness of breath, fever, cough and chest pain at PAT appointment   Patient verbalized understanding of instructions that were given to them at the PAT appointment. Patient was also instructed that they will need to review over the PAT instructions again at home before surgery.

## 2020-10-12 ENCOUNTER — Other Ambulatory Visit: Payer: Self-pay | Admitting: Orthopaedic Surgery

## 2020-10-13 LAB — SARS CORONAVIRUS 2 (TAT 6-24 HRS): SARS Coronavirus 2: NEGATIVE

## 2020-10-15 ENCOUNTER — Other Ambulatory Visit: Payer: Self-pay | Admitting: Orthopaedic Surgery

## 2020-10-15 DIAGNOSIS — Z01818 Encounter for other preprocedural examination: Secondary | ICD-10-CM

## 2020-10-15 NOTE — H&P (Signed)
TOTAL HIP ADMISSION H&P  Patient is admitted for left total hip arthroplasty.  Subjective:  Chief Complaint: left hip pain  HPI: Ellen [name], 70 y.o. female, has a history of pain and functional disability in the left hip(s) due to arthritis and patient has failed non-surgical conservative treatments for greater than 12 weeks to include NSAID's and/or analgesics, corticosteriod injections, flexibility and strengthening excercises, supervised PT with diminished ADL's post treatment, use of assistive devices, weight reduction as appropriate, and activity modification.  Onset of symptoms was gradual starting 5 years ago with gradually worsening course since that time.The patient noted no past surgery on the left hip(s).  Patient currently rates pain in the left hip at 10 out of 10 with activity. Patient has night pain, worsening of pain with activity and weight bearing, trendelenberg gait, pain that interfers with activities of daily living, and crepitus. Patient has evidence of subchondral cysts, subchondral sclerosis, periarticular osteophytes, and joint space narrowing by imaging studies. This condition presents safety issues increasing the risk of falls. There is no current active infection.  Patient Active Problem List   Diagnosis Date Noted  . Greater trochanteric bursitis of left hip 08/29/2020  . Burning with urination 03/13/2020  . Hair loss 02/29/2020  . Preop exam for internal medicine 11/21/2019  . Arthritis of right hip 09/29/2019  . Degenerative disc disease, lumbar 09/29/2019  . Agatston CAC score, <100 09/26/2019  . Acute medial meniscus tear, right, initial encounter 08/18/2019  . 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 Dry skin 05/24/2019  . UTI (urinary tract infection) 05/23/2019  . Nausea 05/09/2019  . Strain of muscle of right groin region 03/14/2019  . Pes anserinus bursitis of right knee 10/19/2018  . Morton's neuroma of left foot 06/08/2018  . Dermatitis 01/01/2017  .  Acute MEE (middle ear effusion) 11/10/2016  . Rotator cuff syndrome, right 10/13/2016  . Bacterial sinusitis 08/20/2016  . Greater trochanteric bursitis of right hip 09/24/2015  . Cellulitis and abscess of leg 08/31/2015  . Eosinophilia 03/27/2015  . Neoplasm of uncertain behavior of skin 12/14/2014  . Cough 09/28/2014  . Conjunctivitis 02/13/2014  . Hypothyroidism 01/13/2014  . Dark stools 04/16/2012  . RUQ abdominal pain 04/16/2012  . Cold sore 11/18/2011  . Well adult exam 07/15/2011  . Closed right ankle fracture 07/15/2011  . Hypertension 07/15/2011  . Weight gain 09/18/2010  . TOBACCO USE, QUIT 10/19/2008  . Rash and other nonspecific skin eruption 10/19/2007  . OTITIS MEDIA, CHRONIC SEROUS 08/31/2007  . Dyslipidemia 07/28/2007  . GERD 04/13/2007  . SYNCOPE 04/13/2007  . Generalized anxiety disorder 10/16/2006  . Insomnia 10/16/2006  . Depression 10/16/2006  . ALLERGIC RHINITIS 10/16/2006  . Cystitis 10/16/2006   Past Medical History:  Diagnosis Date  . ALLERGIC RHINITIS   . ANXIETY   . Arthritis   . GERD   . Hypothyroidism   . INSOMNIA-SLEEP DISORDER-UNSPEC   . Osteopenia 01/2016   T score -1.2 FRAX 15%/0.9%  . Shingles   . Thyroid disease     Past Surgical History:  Procedure Laterality Date  . ANKLE FRACTURE SURGERY  2012   R  . APPENDECTOMY    . BREAST SURGERY     REDUCTION MAMMOPLASTY  . CESAREAN SECTION     twice  . COSMETIC SURGERY    . FACELIFT     s/p  . HIP SURGERY Right 12/08/2019   Patient reported  . TONSILLECTOMY  1974  . Upper nl TSH w/sx    .  VAGINAL HYSTERECTOMY  1991   TVH, irregular bleeding    No current facility-administered medications for this encounter.   Current Outpatient Medications  Medication Sig Dispense Refill Last Dose  . Ascorbic Acid (SUPER C COMPLEX PO) Take 1 tablet by mouth daily.     . benzonatate (TESSALON) 100 MG capsule TAKE 1 TO 2 CAPSULES BY MOUTH THREE TIMES DAILY AS NEEDED (Patient taking differently:  Take 100-200 mg by mouth 3 (three) times daily as needed for cough.) 60 capsule 0   . Cholecalciferol (VITAMIN D) 125 MCG (5000 UT) CAPS Take 5,000 Units by mouth in the morning and at bedtime.     . citalopram (CELEXA) 20 MG tablet Take 1 tablet (20 mg total) by mouth daily. (Patient taking differently: Take 10 mg by mouth daily.) 90 tablet 3   . COLLAGEN PO Take 1 tablet by mouth daily.     [name] CRANBERRY EXTRACT PO Take 1 capsule by mouth daily.      . Cyanocobalamin (B-12) 2500 MCG TABS Take 2,500 mcg by mouth daily.     . D-Mannose 500 MG CAPS Take 500 mg by mouth daily.     . fexofenadine (ALLEGRA) 180 MG tablet Take 180 mg by mouth daily.     [name] gabapentin (NEURONTIN) 100 MG capsule Take 1 capsule (100 mg total) by mouth 2 (two) times daily. (Patient taking differently: Take 200 mg by mouth See admin instructions. Takes 200mg  in the AM. Takes an 200mg  later in the day if needed for pain.) 180 capsule 3   . gabapentin (NEURONTIN) 300 MG capsule TAKE 1 CAPSULE BY MOUTH THREE TIMES DAILY (Patient taking differently: Take 300 mg by mouth at bedtime.) 90 capsule 3   . levothyroxine (SYNTHROID) 125 MCG tablet Take 1 tablet (125 mcg total) by mouth daily. 90 tablet 3   . methocarbamol (ROBAXIN) 500 MG tablet Take 1 tablet (500 mg total) by mouth daily as needed for muscle spasms. (Patient taking differently: Take 250 mg by mouth 2 (two) times daily as needed for muscle spasms.) 90 tablet 0   . minoxidil (ROGAINE) 2 % external solution Apply 1 application topically 2 (two) times daily. Applies to scalp     . Multiple Vitamin (MULTIVITAMIN) tablet Take 1 tablet by mouth daily.     . NON FORMULARY Take 1 capsule by mouth at bedtime. Tart cherry extract     . OVER THE COUNTER MEDICATION Apply 1 application topically in the morning and at bedtime. [place]. Apply to scalp twice a day for hair growth.     . pantoprazole (PROTONIX) 20 MG tablet Take 1 tablet (20 mg total) by mouth daily. 90 tablet 3   .  traZODone (DESYREL) 50 MG tablet Take 1 tablet (50 mg total) by mouth at bedtime. 90 tablet 3   . TURMERIC PO Take 1 capsule by mouth daily.     Cristino Martes Root 100 MG CAPS Take 100 mg by mouth at bedtime.     [name] zinc gluconate 50 MG tablet Take 50 mg by mouth daily.      Allergies  Allergen Reactions  . Belviq [Lorcaserin Hcl] Diarrhea  . Demerol [Meperidine] Other (See Comments)    Palpitations. Many years ago.  Madelin Headings [Amitriptyline Hcl] Other (See Comments)    constipation  . Oxycodone Other (See Comments)    Confusion  . Peanut Oil Itching  . Shellfish-Derived Products Itching  . Latex Hives, Itching and Rash    Redness   .  Medical Adhesive Remover Hives, Itching and Rash    Social History   Tobacco Use  . Smoking status: Never  . Smokeless tobacco: Never  Substance Use Topics  . Alcohol use: Yes    Alcohol/week: 10.0 standard drinks    Types: 10 Glasses of wine per week    Comment: wine daily    Family History  Problem Relation Age of Onset  . Heart disease Father        CAD/CABG in his 34's  . Hypertension Mother   . Heart failure Mother      Review of Systems  Musculoskeletal:  Positive for arthralgias.       Left hip  All other systems reviewed and are negative.  Objective:  Physical Exam Constitutional:      Appearance: Normal appearance.  HENT:     Head: Normocephalic and atraumatic.     Nose: Nose normal.     Mouth/Throat:     Pharynx: Oropharynx is clear.  Eyes:     Extraocular Movements: Extraocular movements intact.  Cardiovascular:     Rate and Rhythm: Normal rate and regular rhythm.     Pulses: Normal pulses.  Pulmonary:     Effort: Pulmonary effort is normal.  Abdominal:     Palpations: Abdomen is soft.  Musculoskeletal:     Cervical back: Normal range of motion.     Comments: Left hip motion is limited and extremely painful with internal rotation.  Her leg lengths are roughly equal.  She is walking with a markedly altered gait.   Sensation and motor function are intact distally with palpable pulses in her feet.  Skin:    General: Skin is warm and dry.  Neurological:     General: No focal deficit present.     Mental Status: She is alert and oriented to person, place, and time.  Psychiatric:        Mood and Affect: Mood normal.        Behavior: Behavior normal.        Thought Content: Thought content normal.        Judgment: Judgment normal.    Vital signs in last 24 hours:    Labs:   Estimated body mass index is 27.44 kg/m as calculated from the following:   Height as of 10/11/20: 5\' 2"  (1.575 m).   Weight as of 10/11/20: 68 kg.   Imaging Review Plain radiographs demonstrate severe degenerative joint disease of the left hip(s). The bone quality appears to be good for age and reported activity level.      Assessment/Plan:  End stage primary arthritis, left hip(s)  The patient history, physical examination, clinical judgement of the provider and imaging studies are consistent with end stage degenerative joint disease of the left hip(s) and total hip arthroplasty is deemed medically necessary. The treatment options including medical management, injection therapy, arthroscopy and arthroplasty were discussed at length. The risks and benefits of total hip arthroplasty were presented and reviewed. The risks due to aseptic loosening, infection, stiffness, dislocation/subluxation,  thromboembolic complications and other imponderables were discussed.  The patient acknowledged the explanation, agreed to proceed with the plan and consent was signed. Patient is being admitted for inpatient treatment for surgery, pain control, PT, OT, prophylactic antibiotics, VTE prophylaxis, progressive ambulation and ADL's and discharge planning.The patient is planning to be discharged home with home health services

## 2020-10-16 ENCOUNTER — Encounter (HOSPITAL_COMMUNITY)
Admission: RE | Disposition: A | Discharge status: Home / Self Care | Payer: Self-pay | Source: Ambulatory Visit | Attending: Orthopaedic Surgery

## 2020-10-16 ENCOUNTER — Ambulatory Visit (HOSPITAL_COMMUNITY): Payer: Medicare Other | Admitting: Registered Nurse

## 2020-10-16 ENCOUNTER — Encounter (HOSPITAL_COMMUNITY): Payer: Self-pay | Admitting: Orthopaedic Surgery

## 2020-10-16 ENCOUNTER — Ambulatory Visit (HOSPITAL_COMMUNITY): Payer: Medicare Other

## 2020-10-16 ENCOUNTER — Other Ambulatory Visit: Payer: Self-pay

## 2020-10-16 ENCOUNTER — Observation Stay (HOSPITAL_COMMUNITY)
Admission: RE | Admit: 2020-10-16 | Discharge: 2020-10-18 | Disposition: A | Discharge status: Home / Self Care | Payer: Medicare Other | Source: Ambulatory Visit | Attending: Orthopaedic Surgery | Admitting: Orthopaedic Surgery

## 2020-10-16 DIAGNOSIS — Z91040 Latex allergy status: Secondary | ICD-10-CM | POA: Diagnosis not present

## 2020-10-16 DIAGNOSIS — Z419 Encounter for procedure for purposes other than remedying health state, unspecified: Secondary | ICD-10-CM

## 2020-10-16 DIAGNOSIS — M1612 Unilateral primary osteoarthritis, left hip: Principal | ICD-10-CM | POA: Diagnosis present

## 2020-10-16 DIAGNOSIS — Z79899 Other long term (current) drug therapy: Secondary | ICD-10-CM | POA: Diagnosis not present

## 2020-10-16 DIAGNOSIS — E039 Hypothyroidism, unspecified: Secondary | ICD-10-CM | POA: Insufficient documentation

## 2020-10-16 DIAGNOSIS — Z471 Aftercare following joint replacement surgery: Secondary | ICD-10-CM | POA: Diagnosis not present

## 2020-10-16 DIAGNOSIS — Z96642 Presence of left artificial hip joint: Secondary | ICD-10-CM | POA: Diagnosis not present

## 2020-10-16 DIAGNOSIS — I10 Essential (primary) hypertension: Secondary | ICD-10-CM | POA: Diagnosis not present

## 2020-10-16 DIAGNOSIS — E785 Hyperlipidemia, unspecified: Secondary | ICD-10-CM | POA: Diagnosis not present

## 2020-10-16 HISTORY — PX: TOTAL HIP ARTHROPLASTY: SHX124

## 2020-10-16 LAB — BASIC METABOLIC PANEL
Anion gap: 7 (ref 5–15)
BUN: 9 mg/dL (ref 8–23)
CO2: 24 mmol/L (ref 22–32)
Calcium: 8.7 mg/dL — ABNORMAL LOW (ref 8.9–10.3)
Chloride: 104 mmol/L (ref 98–111)
Creatinine, Ser: 0.5 mg/dL (ref 0.44–1.00)
GFR, Estimated: >60 mL/min (ref >60)
Glucose, Bld: 90 mg/dL (ref 70–99)
Potassium: 3.9 mmol/L (ref 3.5–5.1)
Sodium: 135 mmol/L (ref 135–145)

## 2020-10-16 LAB — PROTIME-INR
INR: 0.9 (ref 0.8–1.2)
Prothrombin Time: 12.6 seconds (ref 11.4–15.2)

## 2020-10-16 LAB — APTT: aPTT: 27 seconds (ref 24–36)

## 2020-10-16 LAB — ABO/RH: ABO/RH(D): O POS

## 2020-10-16 LAB — TYPE AND SCREEN
ABO/RH(D): O POS
Antibody Screen: NEGATIVE

## 2020-10-16 IMAGING — XA DG HIP (WITH PELVIS) OPERATIVE*L*
1 series · 2 of 2 positions shown · non-contrast
Comparison: [DATE]

CLINICAL DATA: Left hip arthroplasty

EXAM:
OPERATIVE LEFT HIP (WITH PELVIS IF PERFORMED) 1 VIEWS
TECHNIQUE: Fluoroscopic spot image(s) were submitted for interpretation
post-operatively.

[Series 1: unknown protocol · 2 of 2 slices shown]
[im 1/2]
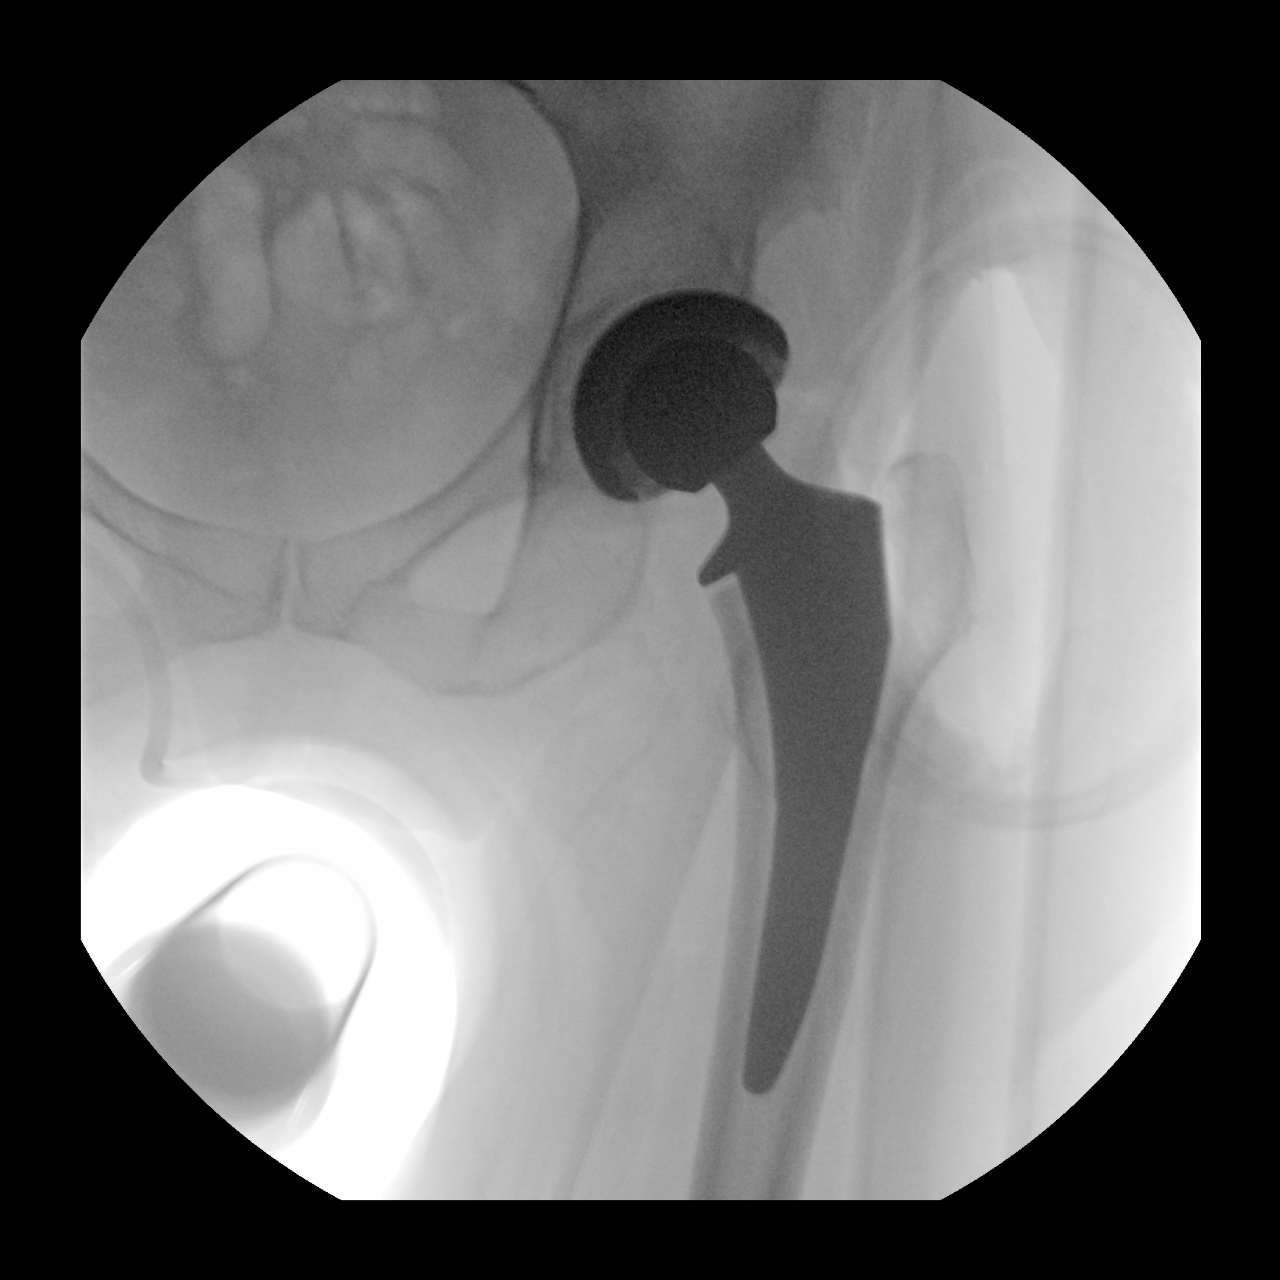
[im 2/2]
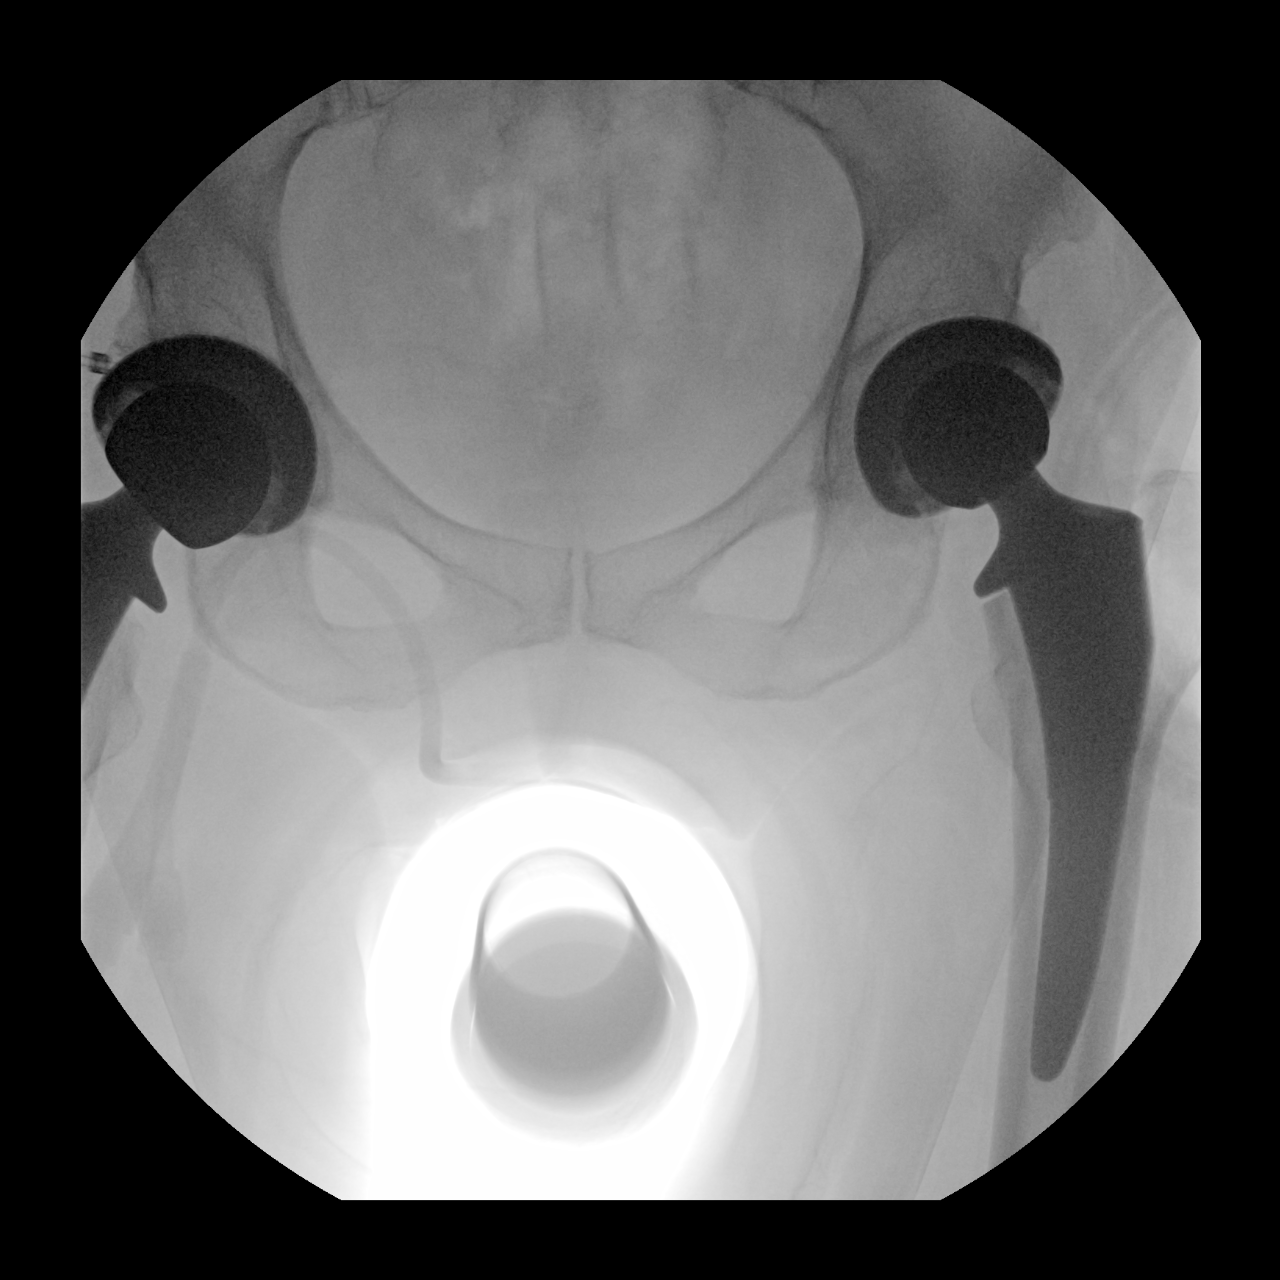

[2 of 2 positions shown; findings below may reference images not displayed]

FINDINGS: Two fluoroscopic images are obtained during the performance of the
procedure and are provided for interpretation only. Images
demonstrate unremarkable left hip arthroplasty in the expected
position without signs of complication. Incidental right hip
arthroplasty is unremarkable.

FLUOROSCOPY TIME:  17 seconds
IMPRESSION: 1. Unremarkable left hip arthroplasty.

## 2020-10-16 SURGERY — ARTHROPLASTY, HIP, TOTAL, ANTERIOR APPROACH
Anesthesia: Spinal | Site: Hip | Laterality: Left

## 2020-10-16 MED ORDER — PROMETHAZINE HCL 25 MG/ML IJ SOLN: 6.2500 mg | INTRAMUSCULAR | Status: DC | PRN

## 2020-10-16 MED ORDER — ALUM & MAG HYDROXIDE-SIMETH 200-200-20 MG/5ML PO SUSP: 30.0000 mL | ORAL | Status: DC | PRN

## 2020-10-16 MED ORDER — HYDROCODONE-ACETAMINOPHEN 7.5-325 MG PO TABS: 1.0000 | ORAL_TABLET | ORAL | Status: DC | PRN

## 2020-10-16 MED ORDER — PHENYLEPHRINE HCL-NACL 20-0.9 MG/250ML-% IV SOLN
INTRAVENOUS | Status: AC
  Filled 2020-10-16: qty 250

## 2020-10-16 MED ORDER — DEXAMETHASONE SODIUM PHOSPHATE 10 MG/ML IJ SOLN
INTRAMUSCULAR | Status: DC | PRN
  Administered 2020-10-16: 5 mg via INTRAVENOUS

## 2020-10-16 MED ORDER — OXYCODONE HCL 5 MG PO TABS: 5.0000 mg | ORAL_TABLET | Freq: Once | ORAL | Status: DC | PRN

## 2020-10-16 MED ORDER — DOCUSATE SODIUM 100 MG PO CAPS
100.0000 mg | ORAL_CAPSULE | Freq: Two times a day (BID) | ORAL | Status: DC
Start: 2020-10-16 — End: 2020-10-18
  Administered 2020-10-16 – 2020-10-18 (×4): 100 mg via ORAL
  Filled 2020-10-16 (×4): qty 1

## 2020-10-16 MED ORDER — ASPIRIN 81 MG PO CHEW
81.0000 mg | CHEWABLE_TABLET | Freq: Two times a day (BID) | ORAL | Status: DC
  Administered 2020-10-17 – 2020-10-18 (×2): 81 mg via ORAL
  Filled 2020-10-16 (×3): qty 1

## 2020-10-16 MED ORDER — METHOCARBAMOL 500 MG IVPB - SIMPLE MED
500.0000 mg | Freq: Four times a day (QID) | INTRAVENOUS | Status: DC | PRN
  Administered 2020-10-16: 500 mg via INTRAVENOUS
  Filled 2020-10-16: qty 500

## 2020-10-16 MED ORDER — POVIDONE-IODINE 10 % EX SWAB: 2.0000 "application " | Freq: Once | CUTANEOUS | Status: DC

## 2020-10-16 MED ORDER — STERILE WATER FOR IRRIGATION IR SOLN
Status: DC | PRN
  Administered 2020-10-16: 1000 mL

## 2020-10-16 MED ORDER — LACTATED RINGERS IV SOLN: INTRAVENOUS | Status: DC

## 2020-10-16 MED ORDER — TRANEXAMIC ACID 1000 MG/10ML IV SOLN
INTRAVENOUS | Status: DC | PRN
  Administered 2020-10-16: 2000 mg via TOPICAL

## 2020-10-16 MED ORDER — METOCLOPRAMIDE HCL 5 MG PO TABS: 5.0000 mg | ORAL_TABLET | Freq: Three times a day (TID) | ORAL | Status: DC | PRN

## 2020-10-16 MED ORDER — ONDANSETRON HCL 4 MG/2ML IJ SOLN
INTRAMUSCULAR | Status: AC
  Filled 2020-10-16: qty 2

## 2020-10-16 MED ORDER — GABAPENTIN 300 MG PO CAPS
300.0000 mg | ORAL_CAPSULE | Freq: Every day | ORAL | Status: DC
  Administered 2020-10-16 – 2020-10-17 (×2): 300 mg via ORAL
  Filled 2020-10-16 (×2): qty 1

## 2020-10-16 MED ORDER — DEXAMETHASONE SODIUM PHOSPHATE 10 MG/ML IJ SOLN
INTRAMUSCULAR | Status: AC
  Filled 2020-10-16: qty 1

## 2020-10-16 MED ORDER — ONDANSETRON HCL 4 MG PO TABS: 4.0000 mg | ORAL_TABLET | Freq: Four times a day (QID) | ORAL | Status: DC | PRN

## 2020-10-16 MED ORDER — TRAZODONE HCL 50 MG PO TABS
50.0000 mg | ORAL_TABLET | Freq: Every day | ORAL | Status: DC
  Administered 2020-10-16 – 2020-10-17 (×2): 50 mg via ORAL
  Filled 2020-10-16 (×2): qty 1

## 2020-10-16 MED ORDER — METOCLOPRAMIDE HCL 5 MG/ML IJ SOLN: 5.0000 mg | Freq: Three times a day (TID) | INTRAMUSCULAR | Status: DC | PRN

## 2020-10-16 MED ORDER — BUPIVACAINE LIPOSOME 1.3 % IJ SUSP
INTRAMUSCULAR | Status: DC | PRN
  Administered 2020-10-16: 10 mL

## 2020-10-16 MED ORDER — MIDAZOLAM HCL 2 MG/2ML IJ SOLN: 0.5000 mg | Freq: Once | INTRAMUSCULAR | Status: DC | PRN

## 2020-10-16 MED ORDER — MENTHOL 3 MG MT LOZG: 1.0000 | LOZENGE | OROMUCOSAL | Status: DC | PRN

## 2020-10-16 MED ORDER — ONDANSETRON HCL 4 MG/2ML IJ SOLN
INTRAMUSCULAR | Status: DC | PRN
  Administered 2020-10-16: 4 mg via INTRAVENOUS

## 2020-10-16 MED ORDER — 0.9 % SODIUM CHLORIDE (POUR BTL) OPTIME
TOPICAL | Status: DC | PRN
  Administered 2020-10-16: 1000 mL

## 2020-10-16 MED ORDER — HYDROMORPHONE HCL 1 MG/ML IJ SOLN: 0.2500 mg | INTRAMUSCULAR | Status: DC | PRN

## 2020-10-16 MED ORDER — GABAPENTIN 100 MG PO CAPS
200.0000 mg | ORAL_CAPSULE | Freq: Every day | ORAL | Status: DC
  Administered 2020-10-17 – 2020-10-18 (×2): 200 mg via ORAL
  Filled 2020-10-16 (×2): qty 2

## 2020-10-16 MED ORDER — BUPIVACAINE LIPOSOME 1.3 % IJ SUSP
INTRAMUSCULAR | Status: AC
  Filled 2020-10-16: qty 20

## 2020-10-16 MED ORDER — MORPHINE SULFATE (PF) 2 MG/ML IV SOLN: 0.5000 mg | INTRAVENOUS | Status: DC | PRN

## 2020-10-16 MED ORDER — MIDAZOLAM HCL 2 MG/2ML IJ SOLN
INTRAMUSCULAR | Status: AC
  Filled 2020-10-16: qty 2

## 2020-10-16 MED ORDER — ACETAMINOPHEN 325 MG PO TABS
ORAL_TABLET | ORAL | Status: AC
  Filled 2020-10-16: qty 2

## 2020-10-16 MED ORDER — CEFAZOLIN SODIUM-DEXTROSE 2-4 GM/100ML-% IV SOLN
2.0000 g | INTRAVENOUS | Status: AC
  Administered 2020-10-16: 2 g via INTRAVENOUS
  Filled 2020-10-16: qty 100

## 2020-10-16 MED ORDER — TRANEXAMIC ACID-NACL 1000-0.7 MG/100ML-% IV SOLN
1000.0000 mg | Freq: Once | INTRAVENOUS | Status: AC
  Administered 2020-10-16: 1000 mg via INTRAVENOUS
  Filled 2020-10-16: qty 100

## 2020-10-16 MED ORDER — BISACODYL 5 MG PO TBEC: 5.0000 mg | DELAYED_RELEASE_TABLET | Freq: Every day | ORAL | Status: DC | PRN

## 2020-10-16 MED ORDER — PHENYLEPHRINE 40 MCG/ML (10ML) SYRINGE FOR IV PUSH (FOR BLOOD PRESSURE SUPPORT)
PREFILLED_SYRINGE | INTRAVENOUS | Status: DC | PRN
  Administered 2020-10-16 (×2): 80 ug via INTRAVENOUS

## 2020-10-16 MED ORDER — BUPIVACAINE-EPINEPHRINE (PF) 0.25% -1:200000 IJ SOLN
INTRAMUSCULAR | Status: DC | PRN
  Administered 2020-10-16: 30 mL

## 2020-10-16 MED ORDER — KETOROLAC TROMETHAMINE 15 MG/ML IJ SOLN
INTRAMUSCULAR | Status: AC
  Filled 2020-10-16: qty 1

## 2020-10-16 MED ORDER — ONDANSETRON HCL 4 MG/2ML IJ SOLN: 4.0000 mg | Freq: Four times a day (QID) | INTRAMUSCULAR | Status: DC | PRN

## 2020-10-16 MED ORDER — ACETAMINOPHEN 325 MG PO TABS
325.0000 mg | ORAL_TABLET | Freq: Four times a day (QID) | ORAL | Status: DC | PRN
  Administered 2020-10-16 – 2020-10-18 (×3): 650 mg via ORAL
  Filled 2020-10-16 (×2): qty 2

## 2020-10-16 MED ORDER — PHENYLEPHRINE HCL-NACL 20-0.9 MG/250ML-% IV SOLN
INTRAVENOUS | Status: DC | PRN
  Administered 2020-10-16: 45 ug/min via INTRAVENOUS

## 2020-10-16 MED ORDER — PHENYLEPHRINE 40 MCG/ML (10ML) SYRINGE FOR IV PUSH (FOR BLOOD PRESSURE SUPPORT)
PREFILLED_SYRINGE | INTRAVENOUS | Status: AC
  Filled 2020-10-16: qty 10

## 2020-10-16 MED ORDER — METHOCARBAMOL 500 MG IVPB - SIMPLE MED
INTRAVENOUS | Status: AC
  Filled 2020-10-16: qty 50

## 2020-10-16 MED ORDER — PANTOPRAZOLE SODIUM 20 MG PO TBEC
20.0000 mg | DELAYED_RELEASE_TABLET | Freq: Every day | ORAL | Status: DC
  Administered 2020-10-17: 20 mg via ORAL
  Filled 2020-10-16 (×2): qty 1

## 2020-10-16 MED ORDER — PROPOFOL 500 MG/50ML IV EMUL
INTRAVENOUS | Status: DC | PRN
  Administered 2020-10-16: 75 ug/kg/min via INTRAVENOUS
  Administered 2020-10-16: 50 ug/kg/min via INTRAVENOUS

## 2020-10-16 MED ORDER — FENTANYL CITRATE (PF) 100 MCG/2ML IJ SOLN
INTRAMUSCULAR | Status: AC
  Filled 2020-10-16: qty 2

## 2020-10-16 MED ORDER — BUPIVACAINE IN DEXTROSE 0.75-8.25 % IT SOLN
INTRATHECAL | Status: DC | PRN
  Administered 2020-10-16: 1.4 mL via INTRATHECAL

## 2020-10-16 MED ORDER — BUPIVACAINE-EPINEPHRINE 0.25% -1:200000 IJ SOLN
INTRAMUSCULAR | Status: AC
  Filled 2020-10-16: qty 1

## 2020-10-16 MED ORDER — KETOROLAC TROMETHAMINE 15 MG/ML IJ SOLN
7.5000 mg | Freq: Four times a day (QID) | INTRAMUSCULAR | Status: AC
  Administered 2020-10-16 – 2020-10-17 (×3): 7.5 mg via INTRAVENOUS
  Filled 2020-10-16: qty 1

## 2020-10-16 MED ORDER — OXYCODONE HCL 5 MG/5ML PO SOLN
5.0000 mg | Freq: Once | ORAL | Status: DC | PRN
Start: 2020-10-16 — End: 2020-10-16

## 2020-10-16 MED ORDER — BUPIVACAINE LIPOSOME 1.3 % IJ SUSP: 10.0000 mL | Freq: Once | INTRAMUSCULAR | Status: DC

## 2020-10-16 MED ORDER — ACETAMINOPHEN 500 MG PO TABS
500.0000 mg | ORAL_TABLET | Freq: Four times a day (QID) | ORAL | Status: AC
Start: 2020-10-16 — End: 2020-10-17
  Administered 2020-10-17: 500 mg via ORAL
  Filled 2020-10-16 (×2): qty 1

## 2020-10-16 MED ORDER — TRANEXAMIC ACID-NACL 1000-0.7 MG/100ML-% IV SOLN
1000.0000 mg | INTRAVENOUS | Status: AC
  Administered 2020-10-16: 1000 mg via INTRAVENOUS
  Filled 2020-10-16: qty 100

## 2020-10-16 MED ORDER — CHLORHEXIDINE GLUCONATE CLOTH 2 % EX PADS
6.0000 | MEDICATED_PAD | Freq: Every day | CUTANEOUS | Status: DC
  Administered 2020-10-17: 6 via TOPICAL

## 2020-10-16 MED ORDER — MIDAZOLAM HCL 5 MG/5ML IJ SOLN
INTRAMUSCULAR | Status: DC | PRN
  Administered 2020-10-16 (×2): 1 mg via INTRAVENOUS

## 2020-10-16 MED ORDER — CEFAZOLIN SODIUM-DEXTROSE 2-4 GM/100ML-% IV SOLN
2.0000 g | Freq: Four times a day (QID) | INTRAVENOUS | Status: AC
  Administered 2020-10-16 – 2020-10-17 (×2): 2 g via INTRAVENOUS
  Filled 2020-10-16 (×2): qty 100

## 2020-10-16 MED ORDER — HYDROCODONE-ACETAMINOPHEN 5-325 MG PO TABS
1.0000 | ORAL_TABLET | ORAL | Status: DC | PRN
  Administered 2020-10-16 – 2020-10-17 (×3): 2 via ORAL
  Filled 2020-10-16 (×3): qty 2

## 2020-10-16 MED ORDER — LEVOTHYROXINE SODIUM 125 MCG PO TABS
125.0000 ug | ORAL_TABLET | Freq: Every day | ORAL | Status: DC
  Administered 2020-10-17 – 2020-10-18 (×2): 125 ug via ORAL
  Filled 2020-10-16 (×2): qty 1

## 2020-10-16 MED ORDER — ORAL CARE MOUTH RINSE: 15.0000 mL | Freq: Once | OROMUCOSAL | Status: AC

## 2020-10-16 MED ORDER — CITALOPRAM HYDROBROMIDE 20 MG PO TABS
10.0000 mg | ORAL_TABLET | Freq: Every day | ORAL | Status: DC
  Administered 2020-10-17 – 2020-10-18 (×2): 10 mg via ORAL
  Filled 2020-10-16 (×2): qty 1

## 2020-10-16 MED ORDER — FENTANYL CITRATE (PF) 100 MCG/2ML IJ SOLN
INTRAMUSCULAR | Status: DC | PRN
  Administered 2020-10-16 (×2): 50 ug via INTRAVENOUS

## 2020-10-16 MED ORDER — CHLORHEXIDINE GLUCONATE 0.12 % MT SOLN
15.0000 mL | Freq: Once | OROMUCOSAL | Status: AC
  Administered 2020-10-16: 15 mL via OROMUCOSAL

## 2020-10-16 MED ORDER — TRANEXAMIC ACID 1000 MG/10ML IV SOLN
2000.0000 mg | INTRAVENOUS | Status: DC
  Filled 2020-10-16: qty 20

## 2020-10-16 MED ORDER — METHOCARBAMOL 500 MG PO TABS
500.0000 mg | ORAL_TABLET | Freq: Four times a day (QID) | ORAL | Status: DC | PRN
Start: 2020-10-16 — End: 2020-10-18
  Administered 2020-10-17 (×2): 500 mg via ORAL
  Filled 2020-10-16 (×2): qty 1

## 2020-10-16 MED ORDER — DIPHENHYDRAMINE HCL 12.5 MG/5ML PO ELIX
12.5000 mg | ORAL_SOLUTION | ORAL | Status: DC | PRN
  Administered 2020-10-17: 25 mg via ORAL
  Filled 2020-10-16: qty 10

## 2020-10-16 MED ORDER — ACETAMINOPHEN 500 MG PO TABS
1000.0000 mg | ORAL_TABLET | Freq: Once | ORAL | Status: AC
  Administered 2020-10-16: 1000 mg via ORAL

## 2020-10-16 MED ORDER — ACETAMINOPHEN 500 MG PO TABS: 1000.0000 mg | ORAL_TABLET | Freq: Once | ORAL | Status: DC

## 2020-10-16 MED ORDER — PHENOL 1.4 % MT LIQD: 1.0000 | OROMUCOSAL | Status: DC | PRN

## 2020-10-16 SURGICAL SUPPLY — 44 items
BAG COUNTER SPONGE SURGICOUNT (BAG) IMPLANT
BAG DECANTER FOR FLEXI CONT (MISCELLANEOUS) ×2 IMPLANT
BLADE SAW SGTL 18X1.27X75 (BLADE) ×2 IMPLANT
BOOTIES KNEE HIGH SLOAN (MISCELLANEOUS) ×2 IMPLANT
CELLS DAT CNTRL 66122 CELL SVR (MISCELLANEOUS) ×1 IMPLANT
COVER PERINEAL POST (MISCELLANEOUS) ×2 IMPLANT
COVER SURGICAL LIGHT HANDLE (MISCELLANEOUS) ×2 IMPLANT
DECANTER SPIKE VIAL GLASS SM (MISCELLANEOUS) ×2 IMPLANT
DRAPE FOOT SWITCH (DRAPES) ×2 IMPLANT
DRAPE IMP U-DRAPE 54X76 (DRAPES) ×2 IMPLANT
DRAPE ORTHO SPLIT 77X108 STRL (DRAPES)
DRAPE STERI IOBAN 125X83 (DRAPES) ×2 IMPLANT
DRAPE SURG ORHT 6 SPLT 77X108 (DRAPES) IMPLANT
DRAPE U-SHAPE 47X51 STRL (DRAPES) ×4 IMPLANT
DRSG AQUACEL AG ADV 3.5X 6 (GAUZE/BANDAGES/DRESSINGS) ×2 IMPLANT
DURAPREP 26ML APPLICATOR (WOUND CARE) ×2 IMPLANT
ELECT BLADE TIP CTD 4 INCH (ELECTRODE) ×2 IMPLANT
ELECT REM PT RETURN 15FT ADLT (MISCELLANEOUS) ×2 IMPLANT
ELIMINATOR HOLE APEX DEPUY (Hips) ×2 IMPLANT
GLOVE SRG 8 PF TXTR STRL LF DI (GLOVE) ×2 IMPLANT
GLOVE SURG ENC MOIS LTX SZ8 (GLOVE) ×4 IMPLANT
GLOVE SURG UNDER POLY LF SZ8 (GLOVE) ×4
GOWN STRL REUS W/TWL XL LVL3 (GOWN DISPOSABLE) ×4 IMPLANT
HEAD FEMORAL 32 CERAMIC (Hips) ×2 IMPLANT
HOLDER FOLEY CATH W/STRAP (MISCELLANEOUS) ×2 IMPLANT
KIT TURNOVER KIT A (KITS) ×2 IMPLANT
LINER ACETABULAR 32X50 (Liner) ×2 IMPLANT
LINER PINN ACET GRIP 50X100 (Liner) ×2 IMPLANT
MANIFOLD NEPTUNE II (INSTRUMENTS) ×2 IMPLANT
NEEDLE HYPO 22GX1.5 SAFETY (NEEDLE) ×2 IMPLANT
NS IRRIG 1000ML POUR BTL (IV SOLUTION) ×2 IMPLANT
PACK ANTERIOR HIP CUSTOM (KITS) ×2 IMPLANT
PENCIL SMOKE EVACUATOR (MISCELLANEOUS) IMPLANT
PROTECTOR NERVE ULNAR (MISCELLANEOUS) ×2 IMPLANT
RTRCTR WOUND ALEXIS 18CM MED (MISCELLANEOUS) ×2
STEM FEMORAL SZ 6MM STD ACTIS (Stem) ×2 IMPLANT
SUT ETHIBOND NAB CT1 #1 30IN (SUTURE) ×4 IMPLANT
SUT VIC AB 1 CT1 36 (SUTURE) ×2 IMPLANT
SUT VIC AB 2-0 CT1 27 (SUTURE) ×2
SUT VIC AB 2-0 CT1 TAPERPNT 27 (SUTURE) ×1 IMPLANT
SUT VICRYL AB 3-0 FS1 BRD 27IN (SUTURE) ×2 IMPLANT
SUT VLOC 180 0 24IN GS25 (SUTURE) ×2 IMPLANT
SYR 50ML LL SCALE MARK (SYRINGE) ×2 IMPLANT
TRAY FOLEY MTR SLVR 16FR STAT (SET/KITS/TRAYS/PACK) ×2 IMPLANT

## 2020-10-16 NOTE — Interval H&P Note (Signed)
History and Physical Interval Note:  10/16/2020 12:02 PM  Ellen [name]  has presented today for surgery, with the diagnosis of LEFT HIP DEGENERATIVE JOINT DISEASE.  The various methods of treatment have been discussed with the patient and family. After consideration of risks, benefits and other options for treatment, the patient has consented to  Procedure(s): LEFT TOTAL HIP ARTHROPLASTY ANTERIOR APPROACH (Left) as a surgical intervention.  The patient's history has been reviewed, patient examined, no change in status, stable for surgery.  I have reviewed the patient's chart and labs.  Questions were answered to the patient's satisfaction.     [name]

## 2020-10-16 NOTE — Op Note (Signed)
PRE-OP DIAGNOSIS:  LEFT HIP DEGENERATIVE JOINT DISEASE POST-OP DIAGNOSIS: same PROCEDURE:  LEFT TOTAL HIP ARTHROPLASTY ANTERIOR APPROACH ANESTHESIA:  Spinal and MAC SURGEON:  Melrose Nakayama MD ASSISTANT:  Loni Dolly PA-C   INDICATIONS FOR PROCEDURE:  The patient is a 70 y.o. female with a long history of a painful hip.  This has persisted despite multiple conservative measures.  The patient has persisted with pain and dysfunction making rest and activity difficult.  A total hip replacement is offered as surgical treatment.  Informed operative consent was obtained after discussion of possible complications including reaction to anesthesia, infection, neurovascular injury, dislocation, DVT, PE, and death.  The importance of the postoperative rehab program to optimize result was stressed with the patient.  SUMMARY OF FINDINGS AND PROCEDURE:  Under the above anesthesia through a anterior approach an the Hana table a left THR was performed.  The patient had severe degenerative change and good bone quality.  We used DePuy components to replace the hip and these were size 6 standard Actis femur capped with a +1 77m ceramic hip ball.  On the acetabular side we used a size 50 Gription shell with a plus 0 neutral polyethylene liner.  We did use a hole eliminator.  ALoni DollyPA-C assisted throughout and was invaluable to the completion of the case in that he helped position and retract while I performed the procedure.  He also closed simultaneously to help minimize OR time.  I used fluoroscopy throughout the case to check position of components and leg lengths and read all these views myself.  DESCRIPTION OF PROCEDURE:  The patient was taken to the OR suite where the above anesthetic was applied.  The patient was then positioned on the Hana table supine.  All bony prominences were appropriately padded.  Prep and drape was then performed in normal sterile fashion.  The patient was given kefzol preoperative  antibiotic and an appropriate time out was performed.  We then took an anterior approach to the left hip.  Dissection was taken through adipose to the tensor fascia lata fascia.  This structure was incised longitudinally and we dissected in the intermuscular interval just medial to this muscle.  Cobra retractors were placed superior and inferior to the femoral neck superficial to the capsule.  A capsular incision was then made and the retractors were placed along the femoral neck.  Xray was brought in to get a good level for the femoral neck cut which was made with an oscillating saw and osteotome.  The femoral head was removed with a corkscrew.  The acetabulum was exposed and some labral tissues were excised. Reaming was taken to the inside wall of the pelvis and sequentially up to 1 mm smaller than the actual component.  A trial of components was done and then the aforementioned acetabular shell was placed in appropriate tilt and anteversion confirmed by fluoroscopy. The liner was placed along with the hole eliminator and attention was turned to the femur.  The leg was brought down and over into adduction and the elevator bar was used to raise the femur up gently in the wound.  The piriformis was released with care taken to preserve the obturator internus attachment and all of the posterior capsule. The femur was reamed and then broached to the appropriate size.  A trial reduction was done and the aforementioned head and neck assembly gave uKoreathe best stability in extension with external rotation.  Leg lengths were felt to be about equal by  fluoroscopic exam.  The trial components were removed and the wound irrigated.  We then placed the femoral component in appropriate anteversion.  The head was applied to a dry stem neck and the hip again reduced.  It was again stable in the aforementioned position.  The would was irrigated again followed by re-approximation of anterior capsule with ethibond suture. Tensor  fascia was repaired with V-loc suture  followed by deep closure with #O and #2 undyed vicryl.  Skin was closed with subQ stitch and steristrips followed by a sterile dressing.  EBL and IOF can be obtained from anesthesia records.  DISPOSITION:  The patient was extubated in the OR and taken to PACU in stable condition to be admitted to the Orthopedic Surgery for appropriate post-op care to include perioperative antibiotics and DVT prophylaxis.

## 2020-10-16 NOTE — Anesthesia Procedure Notes (Signed)
Spinal  Patient location during procedure: OR Start time: 10/16/2020 1:00 PM End time: 10/16/2020 1:04 PM Reason for block: surgical anesthesia Staffing Performed: resident/CRNA  Anesthesiologist: Annye Asa, MD Resident/CRNA: Talbot Grumbling, CRNA Preanesthetic Checklist Completed: patient identified, IV checked, site marked, risks and benefits discussed, surgical consent, monitors and equipment checked, pre-op evaluation and timeout performed Spinal Block Patient position: sitting Prep: DuraPrep Patient monitoring: heart rate, cardiac monitor, continuous pulse ox and blood pressure Approach: midline Location: L3-4 Injection technique: single-shot Needle Needle type: Pencan  Needle gauge: 24 G Needle length: 9 cm Assessment Sensory level: T4 Events: CSF return Additional Notes Clear CSF, no paresthesia, patient tolerated well.

## 2020-10-16 NOTE — Transfer of Care (Signed)
Immediate Anesthesia Transfer of Care Note  Patient: Ellen [name]  Procedure(s) Performed: LEFT TOTAL HIP ARTHROPLASTY ANTERIOR APPROACH (Left: Hip)  Patient Location: PACU  Anesthesia Type:Spinal  Level of Consciousness: awake, alert  and oriented  Airway & Oxygen Therapy: Patient Spontanous Breathing and Patient connected to face mask oxygen  Post-op Assessment: Report given to RN and Post -op Vital signs reviewed and stable  Post vital signs: Reviewed and stable  Last Vitals:  Vitals Value Taken Time  BP    Temp    Pulse 65 10/16/20 1442  Resp 14 10/16/20 1442  SpO2 100 % 10/16/20 1442  Vitals shown include unvalidated device data.  Last Pain:  Vitals:   10/16/20 1047  TempSrc: Oral         Complications: No notable events documented.

## 2020-10-16 NOTE — Anesthesia Postprocedure Evaluation (Signed)
Anesthesia Post Note  Patient: Ellen [name]  Procedure(s) Performed: LEFT TOTAL HIP ARTHROPLASTY ANTERIOR APPROACH (Left: Hip)     Patient location during evaluation: PACU Anesthesia Type: Spinal Level of consciousness: awake and alert, patient cooperative and oriented Pain management: pain level controlled Vital Signs Assessment: post-procedure vital signs reviewed and stable Respiratory status: nonlabored ventilation, respiratory function stable and spontaneous breathing Cardiovascular status: blood pressure returned to baseline and stable Postop Assessment: no apparent nausea or vomiting, patient able to bend at knees, spinal receding and adequate PO intake Anesthetic complications: no   No notable events documented.  Last Vitals:  Vitals:   10/16/20 1515 10/16/20 1530  BP: (!) 109/58 104/63  Pulse: (!) 55 (!) 54  Resp: 12 13  Temp:  36.4 C  SpO2: 100% 100%    Last Pain:  Vitals:   10/16/20 1545  TempSrc:   PainSc: 0-No pain    LLE Motor Response: Responds to commands;Purposeful movement (10/16/20 1545) LLE Sensation: Full sensation (10/16/20 1545) RLE Motor Response: Purposeful movement;Responds to commands (10/16/20 1545) RLE Sensation: Full sensation (10/16/20 1545) L Sensory Level: S1-Sole of foot, small toes (10/16/20 1545) R Sensory Level: S1-Sole of foot, small toes (10/16/20 1545)  Meilin Brosh,E. Kendle Erker

## 2020-10-16 NOTE — Anesthesia Preprocedure Evaluation (Addendum)
Anesthesia Evaluation  Patient identified by MRN, date of birth, ID band Patient awake    Reviewed: Allergy & Precautions, NPO status , Patient's Chart, lab work & pertinent test results  History of Anesthesia Complications Negative for: history of anesthetic complications  Airway Mallampati: II  TM Distance: >3 FB Neck ROM: Full    Dental  (+) Teeth Intact, Dental Advisory Given   Pulmonary neg pulmonary ROS,  10/12/2020 SARS coronavirus NEG   breath sounds clear to auscultation       Cardiovascular hypertension (not on BP meds),  Rhythm:Regular Rate:Normal     Neuro/Psych Anxiety Depression Chronic pain    GI/Hepatic Neg liver ROS, GERD  Medicated and Controlled,  Endo/Other  Hypothyroidism   Renal/GU negative Renal ROS     Musculoskeletal   Abdominal   Peds  Hematology negative hematology ROS (+)   Anesthesia Other Findings   Reproductive/Obstetrics                            Anesthesia Physical Anesthesia Plan  ASA: 2  Anesthesia Plan: Spinal   Post-op Pain Management:    Induction:   PONV Risk Score and Plan: 2 and Ondansetron and Dexamethasone  Airway Management Planned: Natural Airway and Simple Face Mask  Additional Equipment: None  Intra-op Plan:   Post-operative Plan:   Informed Consent: I have reviewed the patients History and Physical, chart, labs and discussed the procedure including the risks, benefits and alternatives for the proposed anesthesia with the patient or authorized representative who has indicated his/her understanding and acceptance.     Dental advisory given  Plan Discussed with: CRNA and Surgeon  Anesthesia Plan Comments:         Anesthesia Quick Evaluation

## 2020-10-16 NOTE — Progress Notes (Addendum)
1940 Patient used call button to report a change in condition.  Reports seeing spots in her visual field.  VS stable. Neuro assessment WNL of baseline.  Pain #3/10 left hip.  Reports less spots and residual spots black and white flashes of light in lower fields and to her left.  IV Ancef rate decreased to 100 ml/hr with lessening of symptoms.  Call placed to [facility] for [facility] at this time.    2055 Updated Dr. Berenice Primas that issue resolved after IV Ancef rate reduced to 100 ml/hr, and may be unrelated. No new orders received.

## 2020-10-16 NOTE — Interval H&P Note (Signed)
History and Physical Interval Note:  10/16/2020 2:25 PM  Ellen [name]  has presented today for surgery, with the diagnosis of LEFT HIP DEGENERATIVE JOINT DISEASE.  The various methods of treatment have been discussed with the patient and family. After consideration of risks, benefits and other options for treatment, the patient has consented to  Procedure(s): LEFT TOTAL HIP ARTHROPLASTY ANTERIOR APPROACH (Left) as a surgical intervention.  The patient's history has been reviewed, patient examined, no change in status, stable for surgery.  I have reviewed the patient's chart and labs.  Questions were answered to the patient's satisfaction.     [name]

## 2020-10-17 DIAGNOSIS — Z91040 Latex allergy status: Secondary | ICD-10-CM | POA: Diagnosis not present

## 2020-10-17 DIAGNOSIS — E039 Hypothyroidism, unspecified: Secondary | ICD-10-CM | POA: Diagnosis not present

## 2020-10-17 DIAGNOSIS — Z79899 Other long term (current) drug therapy: Secondary | ICD-10-CM | POA: Diagnosis not present

## 2020-10-17 DIAGNOSIS — I10 Essential (primary) hypertension: Secondary | ICD-10-CM | POA: Diagnosis not present

## 2020-10-17 DIAGNOSIS — M1612 Unilateral primary osteoarthritis, left hip: Secondary | ICD-10-CM | POA: Diagnosis not present

## 2020-10-17 LAB — BASIC METABOLIC PANEL
Anion gap: 6 (ref 5–15)
BUN: 12 mg/dL (ref 8–23)
CO2: 26 mmol/L (ref 22–32)
Calcium: 8.6 mg/dL — ABNORMAL LOW (ref 8.9–10.3)
Chloride: 110 mmol/L (ref 98–111)
Creatinine, Ser: 0.69 mg/dL (ref 0.44–1.00)
GFR, Estimated: >60 mL/min (ref >60)
Glucose, Bld: 127 mg/dL — ABNORMAL HIGH (ref 70–99)
Potassium: 3.5 mmol/L (ref 3.5–5.1)
Sodium: 142 mmol/L (ref 135–145)

## 2020-10-17 LAB — CBC
HCT: 30.5 % — ABNORMAL LOW (ref 36.0–46.0)
Hemoglobin: 10.2 g/dL — ABNORMAL LOW (ref 12.0–15.0)
MCH: 32.4 pg (ref 26.0–34.0)
MCHC: 33.4 g/dL (ref 30.0–36.0)
MCV: 96.8 fL (ref 80.0–100.0)
Platelets: 146 10*3/uL — ABNORMAL LOW (ref 150–400)
RBC: 3.15 MIL/uL — ABNORMAL LOW (ref 3.87–5.11)
RDW: 13.9 % (ref 11.5–15.5)
WBC: 7.1 10*3/uL (ref 4.0–10.5)
nRBC: 0 % (ref 0.0–0.2)

## 2020-10-17 MED ORDER — ASPIRIN EC 81 MG PO TBEC: 81.0000 mg | DELAYED_RELEASE_TABLET | Freq: Every day | ORAL | 0 refills | Status: DC

## 2020-10-17 MED ORDER — SODIUM CHLORIDE 0.9 % IV BOLUS
500.0000 mL | Freq: Once | INTRAVENOUS | Status: AC
  Administered 2020-10-17: 500 mL via INTRAVENOUS

## 2020-10-17 MED ORDER — METHOCARBAMOL 500 MG PO TABS: 500.0000 mg | ORAL_TABLET | Freq: Four times a day (QID) | ORAL | 0 refills | Status: DC | PRN

## 2020-10-17 MED ORDER — TRAMADOL HCL 50 MG PO TABS: 50.0000 mg | ORAL_TABLET | Freq: Four times a day (QID) | ORAL | 0 refills | Status: DC | PRN

## 2020-10-17 NOTE — Progress Notes (Signed)
Subjective: 1 Day Post-Op Procedure(s) (LRB): LEFT TOTAL HIP ARTHROPLASTY ANTERIOR APPROACH (Left)  Patient resting well. She is hoping to go home today.  Activity level:  wbat Diet tolerance:  ok Voiding:  ok Patient reports pain as mild.    Objective: Vital signs in last 24 hours: Temp:  [97.6 F (36.4 C)-98.5 F (36.9 C)] 98.5 F (36.9 C) (10/12 0605) Pulse Rate:  [53-118] 60 (10/12 0605) Resp:  [9-26] 16 (10/12 0605) BP: (99-140)/(55-103) 119/73 (10/12 0605) SpO2:  [96 %-100 %] 98 % (10/12 0605) Weight:  [68 kg] 68 kg (10/11 1915)  Labs: No results for input(s): HGB in the last 72 hours. No results for input(s): WBC, RBC, HCT, PLT in the last 72 hours. Recent Labs    10/16/20 1034  NA 135  K 3.9  CL 104  CO2 24  BUN 9  CREATININE 0.50  GLUCOSE 90  CALCIUM 8.7*   Recent Labs    10/16/20 1034  INR 0.9    Physical Exam:  Neurologically intact ABD soft Neurovascular intact Sensation intact distally Intact pulses distally Dorsiflexion/Plantar flexion intact Incision: dressing C/D/I and no drainage No cellulitis present Compartment soft  Assessment/Plan:  1 Day Post-Op Procedure(s) (LRB): LEFT TOTAL HIP ARTHROPLASTY ANTERIOR APPROACH (Left) Advance diet Up with therapy D/C IV fluids Discharge home with home health today after PT if cleared and doing well. Follow up in office 2 weeks post op. Continue on 81mg  asa BID for DVT prevention.  Larwance Sachs Jermia Rigsby 10/17/2020, 8:01 AM

## 2020-10-17 NOTE — Discharge Instructions (Signed)

## 2020-10-17 NOTE — Plan of Care (Signed)
  Problem: Clinical Measurements: Goal: Ability to maintain clinical measurements within normal limits will improve Outcome: Progressing   Problem: Activity: Goal: Risk for activity intolerance will decrease Outcome: Progressing   Problem: Pain Management: Goal: Pain level will decrease with appropriate interventions Outcome: Progressing   

## 2020-10-17 NOTE — Progress Notes (Signed)
Pt's BP was 96/58 and asymptomatic. Informed Loni Dolly, PA. Later while working with the PT, BP dropped to 60/39 with dizziness. Informed Loni Dolly, PA and gave NS 500 ml bolus as per his order. BP rechecked and came up to 103/62. PT had second episode of orthostatic hypotension while taking her to the bathroom. Got her in bed and started O2 at 3ltr. BP rechecked 114/73. Pt is comfortable in bed with no dizziness.

## 2020-10-17 NOTE — Progress Notes (Signed)
Physical Therapy Treatment Patient Details Name: Ellen [name] MRN: [id number] DOB: [birth year] Today's Date: 10/17/2020   History of Present Illness 70 y.o. female admitted 10/16/20 for L AA-THA. PMH includes R THA December 2021, DDD, ankle fx, vagal response (per pt).    PT Comments    Pt had an orthostatic episode in the bathroom with nursing prior to PT session. As this was the second such episode today, I held mobility for this afternoon session. Instructed pt/spouse in THA HEP. Pt is very pleasant and puts forth good effort. Progress with mobility is limited by orthostatic episodes. Good progress expected once this is stabilized.      Recommendations for follow up therapy are one component of a multi-disciplinary discharge planning process, led by the attending physician.  Recommendations may be updated based on patient status, additional functional criteria and insurance authorization.  Follow Up Recommendations  Follow surgeon's recommendation for DC plan and follow-up therapies     Equipment Recommendations  None recommended by PT    Recommendations for Other Services       Precautions / Restrictions Precautions Precautions: Fall Precaution Comments: h/o vagal response Restrictions Weight Bearing Restrictions: No LLE Weight Bearing: Weight bearing as tolerated     Mobility  Bed Mobility Overal bed mobility: Needs Assistance Bed Mobility: Supine to Sit     Supine to sit: Supervision     General bed mobility comments: mobility deferred as pt had second  vagal/orthostatic episode with nursing just prior to PT  (pt had walked into bathroom and became faint/dizzy)    Transfers Overall transfer level: Needs assistance Equipment used: Rolling walker (2 wheeled) Transfers: Sit to/from Stand Sit to Stand: Min assist         General transfer comment: VCs hand placement, min A to power up  Ambulation/Gait Ambulation/Gait assistance: Min guard Gait Distance  (Feet): 48 Feet Assistive device: Rolling walker (2 wheeled) Gait Pattern/deviations: Step-to pattern;Decreased step length - right;Decreased step length - left Gait velocity: decr   General Gait Details: distance limited by onset of dizziness, assisted pt to recliner where BP was 60/39, fully reclined pt and called RN, after several minutes in reclined position BP up to 81/46   Stairs             Wheelchair Mobility    Modified Rankin (Stroke Patients Only)       Balance Overall balance assessment: Needs assistance   Sitting balance-Leahy Scale: Good     Standing balance support: Bilateral upper extremity supported Standing balance-Leahy Scale: Fair Standing balance comment: BUE support for dynamic standing                            Cognition Arousal/Alertness: Awake/alert Behavior During Therapy: WFL for tasks assessed/performed Overall Cognitive Status: Within Functional Limits for tasks assessed                                        Exercises Total Joint Exercises Ankle Circles/Pumps: AROM;Both;20 reps;Supine Quad Sets: AROM;Both;10 reps;Supine Short Arc Quad: AROM;Left;10 reps;Supine Heel Slides: AAROM;Left;10 reps;Supine Hip ABduction/ADduction: AAROM;Left;10 reps;Supine Long Arc Quad: AROM;Left;10 reps;Seated    General Comments        Pertinent Vitals/Pain Pain Score: 4  Pain Location: L hip with walking Pain Descriptors / Indicators: Sore Pain Intervention(s): Limited activity within patient's tolerance;Monitored during session;Ice applied  Home Living                      Prior Function            PT Goals (current goals can now be found in the care plan section) Acute Rehab PT Goals Patient Stated Goal: ride bike PT Goal Formulation: With patient/family Time For Goal Achievement: 10/24/20 Potential to Achieve Goals: Good Progress towards PT goals: Not progressing toward goals - comment (2  orthostatic episodes today)    Frequency    7X/week      PT Plan Current plan remains appropriate    Co-evaluation              AM-PAC PT "6 Clicks" Mobility   Outcome Measure  Help needed turning from your back to your side while in a flat bed without using bedrails?: A Little Help needed moving from lying on your back to sitting on the side of a flat bed without using bedrails?: A Little Help needed moving to and from a bed to a chair (including a wheelchair)?: A Little Help needed standing up from a chair using your arms (e.g., wheelchair or bedside chair)?: A Little Help needed to walk in hospital room?: A Lot Help needed climbing 3-5 steps with a railing? : A Little 6 Click Score: 17    End of Session Equipment Utilized During Treatment: Gait belt Activity Tolerance: Treatment limited secondary to medical complications (Comment) Patient left: with call bell/phone within reach;with family/visitor present;in bed;with bed alarm set Nurse Communication: Mobility status PT Visit Diagnosis: Difficulty in walking, not elsewhere classified (R26.2);Pain Pain - Right/Left: Left Pain - part of body: Hip     Time: 0272-5366 PT Time Calculation (min) (ACUTE ONLY): 23 min  Charges:   $Therapeutic Exercise: 23-37 mins                    Blondell Reveal Kistler PT 10/17/2020  Acute Rehabilitation Services Pager [phone] Office [phone]

## 2020-10-17 NOTE — Evaluation (Signed)
Physical Therapy Evaluation Patient Details Name: Ellen [name] MRN: [id number] DOB: [birth year] Today's Date: 10/17/2020  History of Present Illness  70 y.o. female admitted 10/16/20 for L AA-THA. PMH includes R THA December 2021, DDD, ankle fx, vagal response (per pt).  Clinical Impression  Pt is s/p THA resulting in the deficits listed below (see PT Problem List). Pt ambulated 30' with RW, distance limited by onset of dizziness, assisted pt to recliner where BP was 60/39, after several minutes reclined BP was up to 81/46. RN called to room. Will plan to see pt again this afternoon. She reports a h/o vagal response. She passed out at home after her R THA last year.  Pt will benefit from skilled PT to increase their independence and safety with mobility to allow discharge to the venue listed below.          Recommendations for follow up therapy are one component of a multi-disciplinary discharge planning process, led by the attending physician.  Recommendations may be updated based on patient status, additional functional criteria and insurance authorization.  Follow Up Recommendations Follow surgeon's recommendation for DC plan and follow-up therapies    Equipment Recommendations  None recommended by PT    Recommendations for Other Services       Precautions / Restrictions Precautions Precautions: Fall Precaution Comments: h/o vagal response Restrictions Weight Bearing Restrictions: No LLE Weight Bearing: Weight bearing as tolerated      Mobility  Bed Mobility Overal bed mobility: Needs Assistance Bed Mobility: Supine to Sit     Supine to sit: Supervision     General bed mobility comments: instructed pt to self assist LLE with RLE    Transfers Overall transfer level: Needs assistance Equipment used: Rolling walker (2 wheeled) Transfers: Sit to/from Stand Sit to Stand: Min assist         General transfer comment: VCs hand placement, min A to power  up  Ambulation/Gait Ambulation/Gait assistance: Min guard Gait Distance (Feet): 48 Feet Assistive device: Rolling walker (2 wheeled) Gait Pattern/deviations: Step-to pattern;Decreased step length - right;Decreased step length - left Gait velocity: decr   General Gait Details: distance limited by onset of dizziness, assisted pt to recliner where BP was 60/39, fully reclined pt and called RN, after several minutes in reclined position BP up to 81/46  Stairs            Wheelchair Mobility    Modified Rankin (Stroke Patients Only)       Balance Overall balance assessment: Needs assistance   Sitting balance-Leahy Scale: Good     Standing balance support: Bilateral upper extremity supported Standing balance-Leahy Scale: Fair Standing balance comment: BUE support for dynamic standing                             Pertinent Vitals/Pain Pain Assessment: 0-10 Pain Score: 6  Pain Location: L hip with walking Pain Descriptors / Indicators: Sore Pain Intervention(s): Limited activity within patient's tolerance;Monitored during session;Premedicated before session;Ice applied    Home Living Family/patient expects to be discharged to:: Private residence Living Arrangements: Spouse/significant other;Children Available Help at Discharge: Family;Available 24 hours/day Type of Home: House Home Access: Stairs to enter Entrance Stairs-Rails: Can reach both;Left;Right Entrance Stairs-Number of Steps: 2 Home Layout: Two level;Able to live on main level with bedroom/bathroom Home Equipment: Kasandra Knudsen - single point;Walker - 2 wheels;Grab bars - toilet;Toilet riser      Prior Function Level of Independence: Independent  Hand Dominance        Extremity/Trunk Assessment   Upper Extremity Assessment Upper Extremity Assessment: Overall WFL for tasks assessed    Lower Extremity Assessment Lower Extremity Assessment: LLE deficits/detail LLE Deficits /  Details: knee ext at least 3/5, hip ~2/5 LLE Sensation: WNL LLE Coordination: WNL    Cervical / Trunk Assessment Cervical / Trunk Assessment: Normal  Communication   Communication: No difficulties  Cognition Arousal/Alertness: Awake/alert Behavior During Therapy: WFL for tasks assessed/performed Overall Cognitive Status: Within Functional Limits for tasks assessed                                        General Comments      Exercises Total Joint Exercises Ankle Circles/Pumps: AROM;Both;20 reps;Supine Heel Slides: AAROM;Left;10 reps;Supine Hip ABduction/ADduction: AAROM;Left;10 reps;Supine Long Arc Quad: AROM;Left;10 reps;Seated   Assessment/Plan    PT Assessment Patient needs continued PT services  PT Problem List Decreased strength;Decreased mobility;Decreased activity tolerance;Pain       PT Treatment Interventions DME instruction;Therapeutic activities;Gait training;Functional mobility training;Stair training;Therapeutic exercise;Patient/family education    PT Goals (Current goals can be found in the Care Plan section)  Acute Rehab PT Goals Patient Stated Goal: ride bike PT Goal Formulation: With patient/family Time For Goal Achievement: 10/24/20 Potential to Achieve Goals: Good    Frequency 7X/week   Barriers to discharge        Co-evaluation               AM-PAC PT "6 Clicks" Mobility  Outcome Measure Help needed turning from your back to your side while in a flat bed without using bedrails?: A Little Help needed moving from lying on your back to sitting on the side of a flat bed without using bedrails?: A Little Help needed moving to and from a bed to a chair (including a wheelchair)?: A Little Help needed standing up from a chair using your arms (e.g., wheelchair or bedside chair)?: A Little Help needed to walk in hospital room?: A Little Help needed climbing 3-5 steps with a railing? : A Little 6 Click Score: 18    End of  Session Equipment Utilized During Treatment: Gait belt Activity Tolerance: Treatment limited secondary to medical complications (Comment) Patient left: in chair;with call bell/phone within reach;with family/visitor present Nurse Communication: Mobility status PT Visit Diagnosis: Difficulty in walking, not elsewhere classified (R26.2);Pain Pain - Right/Left: Left Pain - part of body: Hip    Time: 1025-8527 PT Time Calculation (min) (ACUTE ONLY): 34 min   Charges:   PT Evaluation $PT Eval Moderate Complexity: 1 Mod PT Treatments $Gait Training: 8-22 mins       Blondell Reveal Kistler PT 10/17/2020  Acute Rehabilitation Services Pager [phone] Office [phone]

## 2020-10-17 NOTE — TOC Transition Note (Signed)
Transition of Care [facility]) - CM/SW Discharge Note   Patient Details  Name: Ellen [name] MRN: [id number] Date of Birth: 11/15/1950  [facility] CM/SW Contact:  Lennart Pall, LCSW Phone Number: 10/17/2020, 1:38 PM   Clinical Narrative:    Met with pt and confirming she has all needed DME at home.  Plan for OPPT at [facility].  No further TOC needs.   Final next level of care: OP Rehab Barriers to Discharge: No Barriers Identified   Patient Goals and CMS Choice Patient states their goals for this hospitalization and ongoing recovery are:: return home      Discharge Placement                       Discharge Plan and Services                DME Arranged: N/A DME Agency: NA                  Social Determinants of Health (SDOH) Interventions     Readmission Risk Interventions No flowsheet data found.

## 2020-10-17 NOTE — Discharge Summary (Signed)
Patient ID: SARYAH LOPER MRN: [id number] DOB/AGE: 1950-09-18 70 y.o.  Admit date: 10/16/2020 Discharge date: 10/17/2020  Admission Diagnoses:  Principal Problem:   Primary localized osteoarthritis of left hip Active Problems:   Primary osteoarthritis of left hip   Discharge Diagnoses:  Same  Past Medical History:  Diagnosis Date   ALLERGIC RHINITIS    ANXIETY    Arthritis    GERD    Hypothyroidism    INSOMNIA-SLEEP DISORDER-UNSPEC    Osteopenia 01/2016   T score -1.2 FRAX 15%/0.9%   Shingles    Thyroid disease     Surgeries: Procedure(s): LEFT TOTAL HIP ARTHROPLASTY ANTERIOR APPROACH on 10/16/2020   Consultants:   Discharged Condition: Improved  Hospital Course: KIAHNA BANGHART is an 70 y.o. female who was admitted 10/16/2020 for operative treatment ofPrimary localized osteoarthritis of left hip. Patient has severe unremitting pain that affects sleep, daily activities, and work/hobbies. After pre-op clearance the patient was taken to the operating room on 10/16/2020 and underwent  Procedure(s): LEFT TOTAL HIP ARTHROPLASTY ANTERIOR APPROACH.    Patient was given perioperative antibiotics:  Anti-infectives (From admission, onward)    Start     Dose/Rate Route Frequency Ordered Stop   10/16/20 1845  ceFAZolin (ANCEF) IVPB 2g/100 mL premix        2 g 200 mL/hr over 30 Minutes Intravenous Every 6 hours 10/16/20 1754 10/17/20 0045   10/16/20 1045  ceFAZolin (ANCEF) IVPB 2g/100 mL premix        2 g 200 mL/hr over 30 Minutes Intravenous On call to O.R. 10/16/20 1033 10/16/20 1315        Patient was given sequential compression devices, early ambulation, and chemoprophylaxis to prevent DVT.  Patient benefited maximally from hospital stay and there were no complications.    Recent vital signs: Patient Vitals for the past 24 hrs:  BP Temp Temp src Pulse Resp SpO2 Height Weight  10/17/20 0605 119/73 98.5 F (36.9 C) Oral 60 16 98 % -- --  10/17/20 0211 (!) 112/58 98.5  F (36.9 C) Oral 63 18 99 % -- --  10/16/20 2108 124/74 98.3 F (36.8 C) Oral 64 18 98 % -- --  10/16/20 1943 108/70 97.9 F (36.6 C) -- 70 18 100 % -- --  10/16/20 1915 -- -- -- -- -- -- 5\' 2"  (1.575 m) 68 kg  10/16/20 1749 (!) 117/103 98 F (36.7 C) Oral 63 14 96 % -- --  10/16/20 1730 107/79 97.6 F (36.4 C) -- 68 13 100 % -- --  10/16/20 1700 121/64 97.6 F (36.4 C) -- (!) 57 13 100 % -- --  10/16/20 1645 121/79 -- -- (!) 53 (!) 9 100 % -- --  10/16/20 1630 -- -- -- 68 (!) 23 98 % -- --  10/16/20 1600 109/67 -- -- (!) 55 (!) 21 100 % -- --  10/16/20 1545 -- -- -- (!) 56 14 100 % -- --  10/16/20 1530 104/63 97.6 F (36.4 C) -- (!) 54 13 100 % -- --  10/16/20 1515 (!) 109/58 -- -- (!) 55 12 100 % -- --  10/16/20 1510 -- -- -- (!) 57 15 100 % -- --  10/16/20 1500 99/61 -- -- (!) 58 12 100 % -- --  10/16/20 1450 -- -- -- 67 20 98 % -- --  10/16/20 1440 (!) 99/55 97.6 F (36.4 C) -- (!) 118 (!) 26 96 % -- --  10/16/20 1047 140/67 97.6 F (36.4 C)  Oral 60 16 100 % -- --     Recent laboratory studies:  Recent Labs    10/16/20 1034  NA 135  K 3.9  CL 104  CO2 24  BUN 9  CREATININE 0.50  GLUCOSE 90  INR 0.9  CALCIUM 8.7*     Discharge Medications:   Allergies as of 10/17/2020       Reactions   Belviq [lorcaserin Hcl] Diarrhea   Demerol [meperidine] Other (See Comments)   Palpitations. Many years ago.   Elavil [amitriptyline Hcl] Other (See Comments)   constipation   Oxycodone Other (See Comments)   Confusion   Peanut Oil Itching   Shellfish-derived Products Itching   Latex Hives, Itching, Rash   Redness    Medical Adhesive Remover Hives, Itching, Rash        Medication List     TAKE these medications    aspirin EC 81 MG tablet Take 1 tablet (81 mg total) by mouth daily. Swallow whole.   B-12 2500 MCG Tabs Take 2,500 mcg by mouth daily.   benzonatate 100 MG capsule Commonly known as: TESSALON TAKE 1 TO 2 CAPSULES BY MOUTH THREE TIMES DAILY AS  NEEDED What changed: See the new instructions.   citalopram 20 MG tablet Commonly known as: CELEXA Take 1 tablet (20 mg total) by mouth daily. What changed: how much to take   COLLAGEN PO Take 1 tablet by mouth daily.   CRANBERRY EXTRACT PO Take 1 capsule by mouth daily.   D-Mannose 500 MG Caps Take 500 mg by mouth daily.   fexofenadine 180 MG tablet Commonly known as: ALLEGRA Take 180 mg by mouth daily.   gabapentin 300 MG capsule Commonly known as: NEURONTIN TAKE 1 CAPSULE BY MOUTH THREE TIMES DAILY What changed: when to take this   gabapentin 100 MG capsule Commonly known as: NEURONTIN Take 1 capsule (100 mg total) by mouth 2 (two) times daily. What changed:  how much to take when to take this additional instructions   levothyroxine 125 MCG tablet Commonly known as: SYNTHROID Take 1 tablet (125 mcg total) by mouth daily.   methocarbamol 500 MG tablet Commonly known as: Robaxin Take 1 tablet (500 mg total) by mouth every 6 (six) hours as needed for muscle spasms. What changed: when to take this   minoxidil 2 % external solution Commonly known as: ROGAINE Apply 1 application topically 2 (two) times daily. Applies to scalp   multivitamin tablet Take 1 tablet by mouth daily.   NON FORMULARY Take 1 capsule by mouth at bedtime. Tart cherry extract   OVER THE COUNTER MEDICATION Apply 1 application topically in the morning and at bedtime. Bonne Dolores. Apply to scalp twice a day for hair growth.   pantoprazole 20 MG tablet Commonly known as: PROTONIX Take 1 tablet (20 mg total) by mouth daily.   SUPER C COMPLEX PO Take 1 tablet by mouth daily.   traMADol 50 MG tablet Commonly known as: Ultram Take 1-2 tablets (50-100 mg total) by mouth every 6 (six) hours as needed for moderate pain or severe pain (post op pain).   traZODone 50 MG tablet Commonly known as: DESYREL Take 1 tablet (50 mg total) by mouth at bedtime.   TURMERIC PO Take 1 capsule by mouth  daily.   Valerian Root 100 MG Caps Take 100 mg by mouth at bedtime.   Vitamin D 125 MCG (5000 UT) Caps Take 5,000 Units by mouth in the morning and at bedtime.   zinc  gluconate 50 MG tablet Take 50 mg by mouth daily.               Durable Medical Equipment  (From admission, onward)           Start     Ordered   10/16/20 1754  DME Walker rolling  Once       Question:  Patient needs a walker to treat with the following condition  Answer:  Primary osteoarthritis of left hip   10/16/20 1754   10/16/20 1754  DME 3 n 1  Once        10/16/20 1754   10/16/20 1754  DME Bedside commode  Once       Question:  Patient needs a bedside commode to treat with the following condition  Answer:  Primary osteoarthritis of left hip   10/16/20 1754            Diagnostic Studies: MR Lumbar Spine Wo Contrast  Result Date: 09/23/2020 CLINICAL DATA:  Low back pain with left thigh pain for 3 weeks EXAM: MRI LUMBAR SPINE WITHOUT CONTRAST TECHNIQUE: Multiplanar, multisequence MR imaging of the lumbar spine was performed. No intravenous contrast was administered. COMPARISON:  None similar FINDINGS: Segmentation: 5 lumbar type vertebrae based on the lowest ribs by prior radiography. Alignment:  Levoscoliosis.  No significant listhesis. Vertebrae:  No fracture, evidence of discitis, or bone lesion. Conus medullaris and cauda equina: Conus extends to the L1-2 level. Conus and cauda equina appear normal. Paraspinal and other soft tissues: Negative for perispinal mass or inflammation. Disc levels: T12- L1: Disc narrowing and bulging with downward pointing central protrusion. Negative facets. L1-L2: Disc narrowing and endplate degeneration with eccentric right disc bulging and ridging. Moderate thecal sac narrowing. Moderate right foraminal narrowing L2-L3: Disc narrowing and bulging with endplate ridging. Ligamentum flavum thickening. Mild spinal stenosis L3-L4: Disc narrowing and bulging. Degenerative  facet spurring asymmetric to the left. Moderate spinal stenosis. Patent foramina L4-L5: Asymmetric leftward disc collapse, bulging, and ridging. Asymmetric left facet spurring. Moderate left foraminal narrowing. Patent spinal canal L5-S1:Disc narrowing and endplate degeneration with circumferential bulge and ridging. Right more than left facet spurring. No compressive stenosis. IMPRESSION: 1. Generalized lumbar spine degeneration with levoscoliosis. 2. Up to moderate spinal stenosis at L3-4. 3. Up to moderate foraminal narrowing on the left at L4-5 and right at L1-2. Electronically Signed   By: Jorje Guild M.D.   On: 09/23/2020 06:10   MR HIP LEFT WO CONTRAST  Result Date: 09/24/2020 CLINICAL DATA:  Left hip pain for 3 weeks EXAM: MR OF THE LEFT HIP WITHOUT CONTRAST TECHNIQUE: Multiplanar, multisequence MR imaging was performed. No intravenous contrast was administered. COMPARISON:  MRI 10/09/2019 FINDINGS: Bones: Interval postsurgical changes from right total hip arthroplasty with associated susceptibility artifact. No evidence of acute fracture. No dislocation. No avascular necrosis of the left femoral head. Prominent subchondral marrow edema within the left femoral head and acetabulum, increased from prior. Discogenic endplate marrow changes within the visualized lumbar spine. Elsewhere, no bone marrow edema. No marrow replacing bone lesion. Articular cartilage and labrum Articular cartilage: Extensive full-thickness cartilage loss of the left femoral head and acetabulum, significantly progressed from prior. Prominent subchondral marrow edema along both sides of the joint. Labrum:  Diffuse labral degeneration. Joint or bursal effusion Joint effusion:  Trace left hip joint effusion. Bursae: No abnormal bursal fluid collection. Muscles and tendons Muscles and tendons: The gluteal, hamstring, iliopsoas, rectus femoris, and adductor tendons appear intact without tear or significant  tendinosis. Atrophy of the  right gluteus minimus muscle. Otherwise, normal muscle bulk and signal intensity without edema, atrophy, or fatty infiltration. Other findings Miscellaneous: No soft tissue edema or fluid collection. No inguinal lymphadenopathy. Prominent sigmoid diverticulosis. IMPRESSION: Severe left hip osteoarthritis, significantly progressed from prior MRI. Electronically Signed   By: Davina Poke D.O.   On: 09/24/2020 08:06   DG C-Arm 1-60 Min-No Report  Result Date: 10/16/2020 Fluoroscopy was utilized by the requesting physician.  No radiographic interpretation.   DG HIP OPERATIVE UNILAT W OR W/O PELVIS LEFT  Result Date: 10/16/2020 CLINICAL DATA:  Left hip arthroplasty EXAM: OPERATIVE LEFT HIP (WITH PELVIS IF PERFORMED) 1 VIEWS TECHNIQUE: Fluoroscopic spot image(s) were submitted for interpretation post-operatively. COMPARISON:  09/22/2020 FINDINGS: Two fluoroscopic images are obtained during the performance of the procedure and are provided for interpretation only. Images demonstrate unremarkable left hip arthroplasty in the expected position without signs of complication. Incidental right hip arthroplasty is unremarkable. FLUOROSCOPY TIME:  17 seconds IMPRESSION: 1. Unremarkable left hip arthroplasty. Electronically Signed   By: Randa Ngo M.D.   On: 10/16/2020 16:11    Disposition: Discharge disposition: 01-Home or Self Care       Discharge Instructions     Call MD / Call 911   Complete by: As directed    If you experience chest pain or shortness of breath, CALL 911 and be transported to the hospital emergency room.  If you develope a fever above 101 F, pus (white drainage) or increased drainage or redness at the wound, or calf pain, call your surgeon's office.   Constipation Prevention   Complete by: As directed    Drink plenty of fluids.  Prune juice may be helpful.  You may use a stool softener, such as Colace (over the counter) 100 mg twice a day.  Use MiraLax (over the counter) for  constipation as needed.   Diet - low sodium heart healthy   Complete by: As directed    Discharge instructions   Complete by: As directed    INSTRUCTIONS AFTER JOINT REPLACEMENT   Remove items at home which could result in a fall. This includes throw rugs or furniture in walking pathways ICE to the affected joint every three hours while awake for 30 minutes at a time, for at least the first 3-5 days, and then as needed for pain and swelling.  Continue to use ice for pain and swelling. You may notice swelling that will progress down to the foot and ankle.  This is normal after surgery.  Elevate your leg when you are not up walking on it.   Continue to use the breathing machine you got in the hospital (incentive spirometer) which will help keep your temperature down.  It is common for your temperature to cycle up and down following surgery, especially at night when you are not up moving around and exerting yourself.  The breathing machine keeps your lungs expanded and your temperature down.   DIET:  As you were doing prior to hospitalization, we recommend a well-balanced diet.  DRESSING / WOUND CARE / SHOWERING  You may shower 3 days after surgery, but keep the wounds dry during showering.  You may use an occlusive plastic wrap (Press'n Seal for example), NO SOAKING/SUBMERGING IN THE BATHTUB.  If the bandage gets wet, change with a clean dry gauze.  If the incision gets wet, pat the wound dry with a clean towel.  ACTIVITY  Increase activity slowly as tolerated, but follow  the weight bearing instructions below.   No driving for 6 weeks or until further direction given by your physician.  You cannot drive while taking narcotics.  No lifting or carrying greater than 10 lbs. until further directed by your surgeon. Avoid periods of inactivity such as sitting longer than an hour when not asleep. This helps prevent blood clots.  You may return to work once you are authorized by your doctor.      WEIGHT BEARING   Weight bearing as tolerated with assist device (walker, cane, etc) as directed, use it as long as suggested by your surgeon or therapist, typically at least 4-6 weeks.   EXERCISES  Results after joint replacement surgery are often greatly improved when you follow the exercise, range of motion and muscle strengthening exercises prescribed by your doctor. Safety measures are also important to protect the joint from further injury. Any time any of these exercises cause you to have increased pain or swelling, decrease what you are doing until you are comfortable again and then slowly increase them. If you have problems or questions, call your caregiver or physical therapist for advice.   Rehabilitation is important following a joint replacement. After just a few days of immobilization, the muscles of the leg can become weakened and shrink (atrophy).  These exercises are designed to build up the tone and strength of the thigh and leg muscles and to improve motion. Often times heat used for twenty to thirty minutes before working out will loosen up your tissues and help with improving the range of motion but do not use heat for the first two weeks following surgery (sometimes heat can increase post-operative swelling).   These exercises can be done on a training (exercise) mat, on the floor, on a table or on a bed. Use whatever works the best and is most comfortable for you.    Use music or television while you are exercising so that the exercises are a pleasant break in your day. This will make your life better with the exercises acting as a break in your routine that you can look forward to.   Perform all exercises about fifteen times, three times per day or as directed.  You should exercise both the operative leg and the other leg as well.  Exercises include:   Quad Sets - Tighten up the muscle on the front of the thigh (Quad) and hold for 5-10 seconds.   Straight Leg Raises -  With your knee straight (if you were given a brace, keep it on), lift the leg to 60 degrees, hold for 3 seconds, and slowly lower the leg.  Perform this exercise against resistance later as your leg gets stronger.  Leg Slides: Lying on your back, slowly slide your foot toward your buttocks, bending your knee up off the floor (only go as far as is comfortable). Then slowly slide your foot back down until your leg is flat on the floor again.  Angel Wings: Lying on your back spread your legs to the side as far apart as you can without causing discomfort.  Hamstring Strength:  Lying on your back, push your heel against the floor with your leg straight by tightening up the muscles of your buttocks.  Repeat, but this time bend your knee to a comfortable angle, and push your heel against the floor.  You may put a pillow under the heel to make it more comfortable if necessary.   A rehabilitation program following joint replacement surgery  can speed recovery and prevent re-injury in the future due to weakened muscles. Contact your doctor or a physical therapist for more information on knee rehabilitation.    CONSTIPATION  Constipation is defined medically as fewer than three stools per week and severe constipation as less than one stool per week.  Even if you have a regular bowel pattern at home, your normal regimen is likely to be disrupted due to multiple reasons following surgery.  Combination of anesthesia, postoperative narcotics, change in appetite and fluid intake all can affect your bowels.   YOU MUST use at least one of the following options; they are listed in order of increasing strength to get the job done.  They are all available over the counter, and you may need to use some, POSSIBLY even all of these options:    Drink plenty of fluids (prune juice may be helpful) and high fiber foods Colace 100 mg by mouth twice a day  Senokot for constipation as directed and as needed Dulcolax (bisacodyl),  take with full glass of water  Miralax (polyethylene glycol) once or twice a day as needed.  If you have tried all these things and are unable to have a bowel movement in the first 3-4 days after surgery call either your surgeon or your primary doctor.    If you experience loose stools or diarrhea, hold the medications until you stool forms back up.  If your symptoms do not get better within 1 week or if they get worse, check with your doctor.  If you experience "the worst abdominal pain ever" or develop nausea or vomiting, please contact the office immediately for further recommendations for treatment.   ITCHING:  If you experience itching with your medications, try taking only a single pain pill, or even half a pain pill at a time.  You can also use Benadryl over the counter for itching or also to help with sleep.   TED HOSE STOCKINGS:  Use stockings on both legs until for at least 2 weeks or as directed by physician office. They may be removed at night for sleeping.  MEDICATIONS:  See your medication summary on the "After Visit Summary" that nursing will review with you.  You may have some home medications which will be placed on hold until you complete the course of blood thinner medication.  It is important for you to complete the blood thinner medication as prescribed.  PRECAUTIONS:  If you experience chest pain or shortness of breath - call 911 immediately for transfer to the hospital emergency department.   If you develop a fever greater that 101 F, purulent drainage from wound, increased redness or drainage from wound, foul odor from the wound/dressing, or calf pain - CONTACT YOUR SURGEON.                                                   FOLLOW-UP APPOINTMENTS:  If you do not already have a post-op appointment, please call the office for an appointment to be seen by your surgeon.  Guidelines for how soon to be seen are listed in your "After Visit Summary", but are typically between 1-4  weeks after surgery.  OTHER INSTRUCTIONS:   Knee Replacement:  Do not place pillow under knee, focus on keeping the knee straight while resting. CPM instructions: 0-90 degrees, 2 hours in  the morning, 2 hours in the afternoon, and 2 hours in the evening. Place foam block, curve side up under heel at all times except when in CPM or when walking.  DO NOT modify, tear, cut, or change the foam block in any way.  POST-OPERATIVE OPIOID TAPER INSTRUCTIONS: It is important to wean off of your opioid medication as soon as possible. If you do not need pain medication after your surgery it is ok to stop day one. Opioids include: Codeine, Hydrocodone(Norco, Vicodin), Oxycodone(Percocet, oxycontin) and hydromorphone amongst others.  Long term and even short term use of opiods can cause: Increased pain response Dependence Constipation Depression Respiratory depression And more.  Withdrawal symptoms can include Flu like symptoms Nausea, vomiting And more Techniques to manage these symptoms Hydrate well Eat regular healthy meals Stay active Use relaxation techniques(deep breathing, meditating, yoga) Do Not substitute Alcohol to help with tapering If you have been on opioids for less than two weeks and do not have pain than it is ok to stop all together.  Plan to wean off of opioids This plan should start within one week post op of your joint replacement. Maintain the same interval or time between taking each dose and first decrease the dose.  Cut the total daily intake of opioids by one tablet each day Next start to increase the time between doses. The last dose that should be eliminated is the evening dose.     MAKE SURE YOU:  Understand these instructions.  Get help right away if you are not doing well or get worse.    Thank you for letting us be a part of your medical care team.  It is a privilege we respect greatly.  We hope these instructions will help you stay on track for a fast and  full recovery!   Increase activity slowly as tolerated   Complete by: As directed    Post-operative opioid taper instructions:   Complete by: As directed    POST-OPERATIVE OPIOID TAPER INSTRUCTIONS: It is important to wean off of your opioid medication as soon as possible. If you do not need pain medication after your surgery it is ok to stop day one. Opioids include: Codeine, Hydrocodone(Norco, Vicodin), Oxycodone(Percocet, oxycontin) and hydromorphone amongst others.  Long term and even short term use of opiods can cause: Increased pain response Dependence Constipation Depression Respiratory depression And more.  Withdrawal symptoms can include Flu like symptoms Nausea, vomiting And more Techniques to manage these symptoms Hydrate well Eat regular healthy meals Stay active Use relaxation techniques(deep breathing, meditating, yoga) Do Not substitute Alcohol to help with tapering If you have been on opioids for less than two weeks and do not have pain than it is ok to stop all together.  Plan to wean off of opioids This plan should start within one week post op of your joint replacement. Maintain the same interval or time between taking each dose and first decrease the dose.  Cut the total daily intake of opioids by one tablet each day Next start to increase the time between doses. The last dose that should be eliminated is the evening dose.           Follow-up Information     Melrose Nakayama, MD. Go on 10/29/2020.   Specialty: Orthopedic Surgery Why: Your appointment is scheduled for 11:00 Contact information: [address] [address] [place], [place]. Go on 10/18/2020.   Specialty: Physical Therapy Why:  THey will contact you with start time Contact information: [place] [address] [phone]                  Signed: Larwance Sachs Mikail Goostree 10/17/2020, 8:06 AM

## 2020-10-18 DIAGNOSIS — E039 Hypothyroidism, unspecified: Secondary | ICD-10-CM | POA: Diagnosis not present

## 2020-10-18 DIAGNOSIS — M1612 Unilateral primary osteoarthritis, left hip: Secondary | ICD-10-CM | POA: Diagnosis not present

## 2020-10-18 DIAGNOSIS — Z91040 Latex allergy status: Secondary | ICD-10-CM | POA: Diagnosis not present

## 2020-10-18 DIAGNOSIS — Z79899 Other long term (current) drug therapy: Secondary | ICD-10-CM | POA: Diagnosis not present

## 2020-10-18 DIAGNOSIS — I10 Essential (primary) hypertension: Secondary | ICD-10-CM | POA: Diagnosis not present

## 2020-10-18 LAB — CBC WITH DIFFERENTIAL/PLATELET
Abs Immature Granulocytes: 0.05 10*3/uL (ref 0.00–0.07)
Basophils Absolute: 0 10*3/uL (ref 0.0–0.1)
Basophils Relative: 0 %
Eosinophils Absolute: 0 10*3/uL (ref 0.0–0.5)
Eosinophils Relative: 0 %
HCT: 29.6 % — ABNORMAL LOW (ref 36.0–46.0)
Hemoglobin: 9.9 g/dL — ABNORMAL LOW (ref 12.0–15.0)
Immature Granulocytes: 1 %
Lymphocytes Relative: 18 %
Lymphs Abs: 1.6 10*3/uL (ref 0.7–4.0)
MCH: 32.7 pg (ref 26.0–34.0)
MCHC: 33.4 g/dL (ref 30.0–36.0)
MCV: 97.7 fL (ref 80.0–100.0)
Monocytes Absolute: 0.9 10*3/uL (ref 0.1–1.0)
Monocytes Relative: 10 %
Neutro Abs: 6.5 10*3/uL (ref 1.7–7.7)
Neutrophils Relative %: 71 %
Platelets: 146 10*3/uL — ABNORMAL LOW (ref 150–400)
RBC: 3.03 MIL/uL — ABNORMAL LOW (ref 3.87–5.11)
RDW: 14.3 % (ref 11.5–15.5)
WBC: 9.2 10*3/uL (ref 4.0–10.5)
nRBC: 0 % (ref 0.0–0.2)

## 2020-10-18 NOTE — Discharge Summary (Signed)
Patient ID: Ellen [name] MRN: [id number] DOB/AGE: [birth year] 70 y.o.  Admit date: 10/16/2020 Discharge date: 10/18/2020  Admission Diagnoses:  Principal Problem:   Primary localized osteoarthritis of left hip Active Problems:   Primary osteoarthritis of left hip   Discharge Diagnoses:  Same  Past Medical History:  Diagnosis Date   ALLERGIC RHINITIS    ANXIETY    Arthritis    GERD    Hypothyroidism    INSOMNIA-SLEEP DISORDER-UNSPEC    Osteopenia 01/2016   T score -1.2 FRAX 15%/0.9%   Shingles    Thyroid disease     Surgeries: Procedure(s): LEFT TOTAL HIP ARTHROPLASTY ANTERIOR APPROACH on 10/16/2020   Consultants:   Discharged Condition: Improved  Hospital Course: Ellen [name] is an 70 y.o. female who was admitted 10/16/2020 for operative treatment ofPrimary localized osteoarthritis of left hip. Patient has severe unremitting pain that affects sleep, daily activities, and work/hobbies. After pre-op clearance the patient was taken to the operating room on 10/16/2020 and underwent  Procedure(s): LEFT TOTAL HIP ARTHROPLASTY ANTERIOR APPROACH.    Patient was given perioperative antibiotics:  Anti-infectives (From admission, onward)    Start     Dose/Rate Route Frequency Ordered Stop   10/16/20 1845  ceFAZolin (ANCEF) IVPB 2g/100 mL premix        2 g 200 mL/hr over 30 Minutes Intravenous Every 6 hours 10/16/20 1754 10/17/20 0045   10/16/20 1045  ceFAZolin (ANCEF) IVPB 2g/100 mL premix        2 g 200 mL/hr over 30 Minutes Intravenous On call to O.R. 10/16/20 1033 10/16/20 1315        Patient was given sequential compression devices, early ambulation, and chemoprophylaxis to prevent DVT.She did have some dizziness with ambulation which is documented in the physical therapy notes.  She also has low blood pressure.  She was given minimal pain medication and IV fluid boluses to bring this up.  On the date of discharge she was doing great with a very stable blood pressure  and no dizziness upon ambulation.  Patient benefited maximally from hospital stay and there were no complications.    Recent vital signs: Patient Vitals for the past 24 hrs:  BP Temp Temp src Pulse Resp SpO2  10/18/20 0730 135/75 99.1 F (37.3 C) Oral 90 20 91 %  10/18/20 0318 121/77 98.9 F (37.2 C) Oral 92 16 95 %  10/17/20 1949 120/67 98.5 F (36.9 C) Oral 92 16 97 %  10/17/20 1500 114/90 -- -- 81 16 100 %  10/17/20 1359 126/69 -- -- 76 18 100 %  10/17/20 1343 114/73 98.2 F (36.8 C) Oral 72 -- 100 %  10/17/20 1324 95/82 -- -- 68 -- 97 %     Recent laboratory studies:  Recent Labs    10/16/20 1034 10/17/20 1439 10/18/20 0845  WBC  --  7.1 9.2  HGB  --  10.2* 9.9*  HCT  --  30.5* 29.6*  PLT  --  146* 146*  NA 135 142  --   K 3.9 3.5  --   CL 104 110  --   CO2 24 26  --   BUN 9 12  --   CREATININE 0.50 0.69  --   GLUCOSE 90 127*  --   INR 0.9  --   --   CALCIUM 8.7* 8.6*  --      Discharge Medications:   Allergies as of 10/18/2020       Reactions   Belviq [  lorcaserin Hcl] Diarrhea   Demerol [meperidine] Other (See Comments)   Palpitations. Many years ago.   Elavil [amitriptyline Hcl] Other (See Comments)   constipation   Oxycodone Other (See Comments)   Confusion   Peanut Oil Itching   Shellfish-derived Products Itching   Latex Hives, Itching, Rash   Redness    Medical Adhesive Remover Hives, Itching, Rash        Medication List     TAKE these medications    aspirin EC 81 MG tablet Take 1 tablet (81 mg total) by mouth daily. Swallow whole.   B-12 2500 MCG Tabs Take 2,500 mcg by mouth daily.   benzonatate 100 MG capsule Commonly known as: TESSALON TAKE 1 TO 2 CAPSULES BY MOUTH THREE TIMES DAILY AS NEEDED What changed: See the new instructions.   citalopram 20 MG tablet Commonly known as: CELEXA Take 1 tablet (20 mg total) by mouth daily. What changed: how much to take   COLLAGEN PO Take 1 tablet by mouth daily.   CRANBERRY EXTRACT  PO Take 1 capsule by mouth daily.   D-Mannose 500 MG Caps Take 500 mg by mouth daily.   fexofenadine 180 MG tablet Commonly known as: ALLEGRA Take 180 mg by mouth daily.   gabapentin 300 MG capsule Commonly known as: NEURONTIN TAKE 1 CAPSULE BY MOUTH THREE TIMES DAILY What changed: when to take this   gabapentin 100 MG capsule Commonly known as: NEURONTIN Take 1 capsule (100 mg total) by mouth 2 (two) times daily. What changed:  how much to take when to take this additional instructions   levothyroxine 125 MCG tablet Commonly known as: SYNTHROID Take 1 tablet (125 mcg total) by mouth daily.   methocarbamol 500 MG tablet Commonly known as: Robaxin Take 1 tablet (500 mg total) by mouth every 6 (six) hours as needed for muscle spasms. What changed: when to take this   minoxidil 2 % external solution Commonly known as: ROGAINE Apply 1 application topically 2 (two) times daily. Applies to scalp   multivitamin tablet Take 1 tablet by mouth daily.   NON FORMULARY Take 1 capsule by mouth at bedtime. Tart cherry extract   OVER THE COUNTER MEDICATION Apply 1 application topically in the morning and at bedtime. [place] Dolores. Apply to scalp twice a day for hair growth.   pantoprazole 20 MG tablet Commonly known as: PROTONIX Take 1 tablet (20 mg total) by mouth daily.   SUPER C COMPLEX PO Take 1 tablet by mouth daily.   traMADol 50 MG tablet Commonly known as: Ultram Take 1-2 tablets (50-100 mg total) by mouth every 6 (six) hours as needed for moderate pain or severe pain (post op pain).   traZODone 50 MG tablet Commonly known as: DESYREL Take 1 tablet (50 mg total) by mouth at bedtime.   TURMERIC PO Take 1 capsule by mouth daily.   Valerian Root 100 MG Caps Take 100 mg by mouth at bedtime.   Vitamin D 125 MCG (5000 UT) Caps Take 5,000 Units by mouth in the morning and at bedtime.   zinc gluconate 50 MG tablet Take 50 mg by mouth daily.                Durable Medical Equipment  (From admission, onward)           Start     Ordered   10/16/20 1754  DME Walker rolling  Once       Question:  Patient needs a walker to treat  with the following condition  Answer:  Primary osteoarthritis of left hip   10/16/20 1754   10/16/20 1754  DME 3 n 1  Once        10/16/20 1754   10/16/20 1754  DME Bedside commode  Once       Question:  Patient needs a bedside commode to treat with the following condition  Answer:  Primary osteoarthritis of left hip   10/16/20 1754            Diagnostic Studies: MR Lumbar Spine Wo Contrast  Result Date: 09/23/2020 CLINICAL DATA:  Low back pain with left thigh pain for 3 weeks EXAM: MRI LUMBAR SPINE WITHOUT CONTRAST TECHNIQUE: Multiplanar, multisequence MR imaging of the lumbar spine was performed. No intravenous contrast was administered. COMPARISON:  None similar FINDINGS: Segmentation: 5 lumbar type vertebrae based on the lowest ribs by prior radiography. Alignment:  Levoscoliosis.  No significant listhesis. Vertebrae:  No fracture, evidence of discitis, or bone lesion. Conus medullaris and cauda equina: Conus extends to the L1-2 level. Conus and cauda equina appear normal. Paraspinal and other soft tissues: Negative for perispinal mass or inflammation. Disc levels: T12- L1: Disc narrowing and bulging with downward pointing central protrusion. Negative facets. L1-L2: Disc narrowing and endplate degeneration with eccentric right disc bulging and ridging. Moderate thecal sac narrowing. Moderate right foraminal narrowing L2-L3: Disc narrowing and bulging with endplate ridging. Ligamentum flavum thickening. Mild spinal stenosis L3-L4: Disc narrowing and bulging. Degenerative facet spurring asymmetric to the left. Moderate spinal stenosis. Patent foramina L4-L5: Asymmetric leftward disc collapse, bulging, and ridging. Asymmetric left facet spurring. Moderate left foraminal narrowing. Patent spinal canal L5-S1:Disc narrowing  and endplate degeneration with circumferential bulge and ridging. Right more than left facet spurring. No compressive stenosis. IMPRESSION: 1. Generalized lumbar spine degeneration with levoscoliosis. 2. Up to moderate spinal stenosis at L3-4. 3. Up to moderate foraminal narrowing on the left at L4-5 and right at L1-2. Electronically Signed   By: Jorje Guild M.D.   On: 09/23/2020 06:10   MR HIP LEFT WO CONTRAST  Result Date: 09/24/2020 CLINICAL DATA:  Left hip pain for 3 weeks EXAM: MR OF THE LEFT HIP WITHOUT CONTRAST TECHNIQUE: Multiplanar, multisequence MR imaging was performed. No intravenous contrast was administered. COMPARISON:  MRI 10/09/2019 FINDINGS: Bones: Interval postsurgical changes from right total hip arthroplasty with associated susceptibility artifact. No evidence of acute fracture. No dislocation. No avascular necrosis of the left femoral head. Prominent subchondral marrow edema within the left femoral head and acetabulum, increased from prior. Discogenic endplate marrow changes within the visualized lumbar spine. Elsewhere, no bone marrow edema. No marrow replacing bone lesion. Articular cartilage and labrum Articular cartilage: Extensive full-thickness cartilage loss of the left femoral head and acetabulum, significantly progressed from prior. Prominent subchondral marrow edema along both sides of the joint. Labrum:  Diffuse labral degeneration. Joint or bursal effusion Joint effusion:  Trace left hip joint effusion. Bursae: No abnormal bursal fluid collection. Muscles and tendons Muscles and tendons: The gluteal, hamstring, iliopsoas, rectus femoris, and adductor tendons appear intact without tear or significant tendinosis. Atrophy of the right gluteus minimus muscle. Otherwise, normal muscle bulk and signal intensity without edema, atrophy, or fatty infiltration. Other findings Miscellaneous: No soft tissue edema or fluid collection. No inguinal lymphadenopathy. Prominent sigmoid  diverticulosis. IMPRESSION: Severe left hip osteoarthritis, significantly progressed from prior MRI. Electronically Signed   By: Davina Poke D.O.   On: 09/24/2020 08:06   DG C-Arm 1-60 Min-No Report  Result  Date: 10/16/2020 Fluoroscopy was utilized by the requesting physician.  No radiographic interpretation.   DG HIP OPERATIVE UNILAT W OR W/O PELVIS LEFT  Result Date: 10/16/2020 CLINICAL DATA:  Left hip arthroplasty EXAM: OPERATIVE LEFT HIP (WITH PELVIS IF PERFORMED) 1 VIEWS TECHNIQUE: Fluoroscopic spot image(s) were submitted for interpretation post-operatively. COMPARISON:  09/22/2020 FINDINGS: Two fluoroscopic images are obtained during the performance of the procedure and are provided for interpretation only. Images demonstrate unremarkable left hip arthroplasty in the expected position without signs of complication. Incidental right hip arthroplasty is unremarkable. FLUOROSCOPY TIME:  17 seconds IMPRESSION: 1. Unremarkable left hip arthroplasty. Electronically Signed   By: Randa Ngo M.D.   On: 10/16/2020 16:11    Disposition: Discharge disposition: 01-Home or Self Care       Discharge Instructions     Call MD / Call 911   Complete by: As directed    If you experience chest pain or shortness of breath, CALL 911 and be transported to the hospital emergency room.  If you develope a fever above 101 F, pus (white drainage) or increased drainage or redness at the wound, or calf pain, call your surgeon's office.   Constipation Prevention   Complete by: As directed    Drink plenty of fluids.  Prune juice may be helpful.  You may use a stool softener, such as Colace (over the counter) 100 mg twice a day.  Use MiraLax (over the counter) for constipation as needed.   Diet - low sodium heart healthy   Complete by: As directed    Discharge instructions   Complete by: As directed    INSTRUCTIONS AFTER JOINT REPLACEMENT   Remove items at home which could result in a fall. This  includes throw rugs or furniture in walking pathways ICE to the affected joint every three hours while awake for 30 minutes at a time, for at least the first 3-5 days, and then as needed for pain and swelling.  Continue to use ice for pain and swelling. You may notice swelling that will progress down to the foot and ankle.  This is normal after surgery.  Elevate your leg when you are not up walking on it.   Continue to use the breathing machine you got in the hospital (incentive spirometer) which will help keep your temperature down.  It is common for your temperature to cycle up and down following surgery, especially at night when you are not up moving around and exerting yourself.  The breathing machine keeps your lungs expanded and your temperature down.   DIET:  As you were doing prior to hospitalization, we recommend a well-balanced diet.  DRESSING / WOUND CARE / SHOWERING  You may shower 3 days after surgery, but keep the wounds dry during showering.  You may use an occlusive plastic wrap (Press'n Seal for example), NO SOAKING/SUBMERGING IN THE BATHTUB.  If the bandage gets wet, change with a clean dry gauze.  If the incision gets wet, pat the wound dry with a clean towel.  ACTIVITY  Increase activity slowly as tolerated, but follow the weight bearing instructions below.   No driving for 6 weeks or until further direction given by your physician.  You cannot drive while taking narcotics.  No lifting or carrying greater than 10 lbs. until further directed by your surgeon. Avoid periods of inactivity such as sitting longer than an hour when not asleep. This helps prevent blood clots.  You may return to work once you are authorized  by your doctor.     WEIGHT BEARING   Weight bearing as tolerated with assist device (walker, cane, etc) as directed, use it as long as suggested by your surgeon or therapist, typically at least 4-6 weeks.   EXERCISES  Results after joint replacement surgery  are often greatly improved when you follow the exercise, range of motion and muscle strengthening exercises prescribed by your doctor. Safety measures are also important to protect the joint from further injury. Any time any of these exercises cause you to have increased pain or swelling, decrease what you are doing until you are comfortable again and then slowly increase them. If you have problems or questions, call your caregiver or physical therapist for advice.   Rehabilitation is important following a joint replacement. After just a few days of immobilization, the muscles of the leg can become weakened and shrink (atrophy).  These exercises are designed to build up the tone and strength of the thigh and leg muscles and to improve motion. Often times heat used for twenty to thirty minutes before working out will loosen up your tissues and help with improving the range of motion but do not use heat for the first two weeks following surgery (sometimes heat can increase post-operative swelling).   These exercises can be done on a training (exercise) mat, on the floor, on a table or on a bed. Use whatever works the best and is most comfortable for you.    Use music or television while you are exercising so that the exercises are a pleasant break in your day. This will make your life better with the exercises acting as a break in your routine that you can look forward to.   Perform all exercises about fifteen times, three times per day or as directed.  You should exercise both the operative leg and the other leg as well.  Exercises include:   Quad Sets - Tighten up the muscle on the front of the thigh (Quad) and hold for 5-10 seconds.   Straight Leg Raises - With your knee straight (if you were given a brace, keep it on), lift the leg to 60 degrees, hold for 3 seconds, and slowly lower the leg.  Perform this exercise against resistance later as your leg gets stronger.  Leg Slides: Lying on your back, slowly  slide your foot toward your buttocks, bending your knee up off the floor (only go as far as is comfortable). Then slowly slide your foot back down until your leg is flat on the floor again.  Angel Wings: Lying on your back spread your legs to the side as far apart as you can without causing discomfort.  Hamstring Strength:  Lying on your back, push your heel against the floor with your leg straight by tightening up the muscles of your buttocks.  Repeat, but this time bend your knee to a comfortable angle, and push your heel against the floor.  You may put a pillow under the heel to make it more comfortable if necessary.   A rehabilitation program following joint replacement surgery can speed recovery and prevent re-injury in the future due to weakened muscles. Contact your doctor or a physical therapist for more information on knee rehabilitation.    CONSTIPATION  Constipation is defined medically as fewer than three stools per week and severe constipation as less than one stool per week.  Even if you have a regular bowel pattern at home, your normal regimen is likely to be disrupted  due to multiple reasons following surgery.  Combination of anesthesia, postoperative narcotics, change in appetite and fluid intake all can affect your bowels.   YOU MUST use at least one of the following options; they are listed in order of increasing strength to get the job done.  They are all available over the counter, and you may need to use some, POSSIBLY even all of these options:    Drink plenty of fluids (prune juice may be helpful) and high fiber foods Colace 100 mg by mouth twice a day  Senokot for constipation as directed and as needed Dulcolax (bisacodyl), take with full glass of water  Miralax (polyethylene glycol) once or twice a day as needed.  If you have tried all these things and are unable to have a bowel movement in the first 3-4 days after surgery call either your surgeon or your primary doctor.     If you experience loose stools or diarrhea, hold the medications until you stool forms back up.  If your symptoms do not get better within 1 week or if they get worse, check with your doctor.  If you experience "the worst abdominal pain ever" or develop nausea or vomiting, please contact the office immediately for further recommendations for treatment.   ITCHING:  If you experience itching with your medications, try taking only a single pain pill, or even half a pain pill at a time.  You can also use Benadryl over the counter for itching or also to help with sleep.   TED HOSE STOCKINGS:  Use stockings on both legs until for at least 2 weeks or as directed by physician office. They may be removed at night for sleeping.  MEDICATIONS:  See your medication summary on the "After Visit Summary" that nursing will review with you.  You may have some home medications which will be placed on hold until you complete the course of blood thinner medication.  It is important for you to complete the blood thinner medication as prescribed.  PRECAUTIONS:  If you experience chest pain or shortness of breath - call 911 immediately for transfer to the hospital emergency department.   If you develop a fever greater that 101 F, purulent drainage from wound, increased redness or drainage from wound, foul odor from the wound/dressing, or calf pain - CONTACT YOUR SURGEON.                                                   FOLLOW-UP APPOINTMENTS:  If you do not already have a post-op appointment, please call the office for an appointment to be seen by your surgeon.  Guidelines for how soon to be seen are listed in your "After Visit Summary", but are typically between 1-4 weeks after surgery.  OTHER INSTRUCTIONS:   Knee Replacement:  Do not place pillow under knee, focus on keeping the knee straight while resting. CPM instructions: 0-90 degrees, 2 hours in the morning, 2 hours in the afternoon, and 2 hours in the evening.  Place foam block, curve side up under heel at all times except when in CPM or when walking.  DO NOT modify, tear, cut, or change the foam block in any way.  POST-OPERATIVE OPIOID TAPER INSTRUCTIONS: It is important to wean off of your opioid medication as soon as possible. If you do not need pain medication  after your surgery it is ok to stop day one. Opioids include: Codeine, Hydrocodone(Norco, Vicodin), Oxycodone(Percocet, oxycontin) and hydromorphone amongst others.  Long term and even short term use of opiods can cause: Increased pain response Dependence Constipation Depression Respiratory depression And more.  Withdrawal symptoms can include Flu like symptoms Nausea, vomiting And more Techniques to manage these symptoms Hydrate well Eat regular healthy meals Stay active Use relaxation techniques(deep breathing, meditating, yoga) Do Not substitute Alcohol to help with tapering If you have been on opioids for less than two weeks and do not have pain than it is ok to stop all together.  Plan to wean off of opioids This plan should start within one week post op of your joint replacement. Maintain the same interval or time between taking each dose and first decrease the dose.  Cut the total daily intake of opioids by one tablet each day Next start to increase the time between doses. The last dose that should be eliminated is the evening dose.     MAKE SURE YOU:  Understand these instructions.  Get help right away if you are not doing well or get worse.    Thank you for letting us be a part of your medical care team.  It is a privilege we respect greatly.  We hope these instructions will help you stay on track for a fast and full recovery!   Increase activity slowly as tolerated   Complete by: As directed    Post-operative opioid taper instructions:   Complete by: As directed    POST-OPERATIVE OPIOID TAPER INSTRUCTIONS: It is important to wean off of your opioid medication  as soon as possible. If you do not need pain medication after your surgery it is ok to stop day one. Opioids include: Codeine, Hydrocodone(Norco, Vicodin), Oxycodone(Percocet, oxycontin) and hydromorphone amongst others.  Long term and even short term use of opiods can cause: Increased pain response Dependence Constipation Depression Respiratory depression And more.  Withdrawal symptoms can include Flu like symptoms Nausea, vomiting And more Techniques to manage these symptoms Hydrate well Eat regular healthy meals Stay active Use relaxation techniques(deep breathing, meditating, yoga) Do Not substitute Alcohol to help with tapering If you have been on opioids for less than two weeks and do not have pain than it is ok to stop all together.  Plan to wean off of opioids This plan should start within one week post op of your joint replacement. Maintain the same interval or time between taking each dose and first decrease the dose.  Cut the total daily intake of opioids by one tablet each day Next start to increase the time between doses. The last dose that should be eliminated is the evening dose.           Follow-up Information     Melrose Nakayama, MD. Go on 10/29/2020.   Specialty: Orthopedic Surgery Why: Your appointment is scheduled for 11:00 Contact information: [address] [place] [address] [place], [place] [address]. Go on 10/18/2020.   Specialty: Physical Therapy Why: THey will contact you with start time Contact information: [place] [address] [phone]                  Signed: Erlene Senters 10/18/2020, 12:52 PM

## 2020-10-18 NOTE — Progress Notes (Signed)
Physical Therapy Treatment Patient Details Name: Ellen [name] MRN: [id number] DOB: 1950/02/11 Today's Date: 10/18/2020   History of Present Illness 70 y.o. female admitted 10/16/20 for L AA-THA. PMH includes R THA December 2021, DDD, ankle fx, vagal response (per pt).    PT Comments    Pt denied dizziness throughout PT session, orthostatic vitals were assessed and were stable (see flowsheets). She ambulated 140' with RW, no loss of balance. Stair training completed. She demonstrates good understanding of HEP. She is ready to DC home from a PT standpoint.     Recommendations for follow up therapy are one component of a multi-disciplinary discharge planning process, led by the attending physician.  Recommendations may be updated based on patient status, additional functional criteria and insurance authorization.  Follow Up Recommendations  Follow surgeon's recommendation for DC plan and follow-up therapies     Equipment Recommendations  None recommended by PT    Recommendations for Other Services       Precautions / Restrictions Precautions Precautions: Fall Precaution Comments: h/o vagal response and orthostatic hypotension with use of narcotics Restrictions LLE Weight Bearing: Weight bearing as tolerated     Mobility  Bed Mobility Overal bed mobility: Needs Assistance Bed Mobility: Supine to Sit     Supine to sit: Min assist;HOB elevated     General bed mobility comments: min A to pivot hips to EOB, increased time, used bedrail    Transfers Overall transfer level: Needs assistance Equipment used: Rolling walker (2 wheeled) Transfers: Sit to/from Stand Sit to Stand: Supervision         General transfer comment: VCs hand placement  Ambulation/Gait Ambulation/Gait assistance: Min guard Gait Distance (Feet): 140 Feet Assistive device: Rolling walker (2 wheeled) Gait Pattern/deviations: Step-to pattern;Decreased step length - right;Decreased step length -  left;Trunk flexed Gait velocity: decr   General Gait Details: steady, no loss of balance, pt denied dizziness throughout entire PT session, VCs posture and positioning in RW   Stairs Stairs: Yes Stairs assistance: Min guard Stair Management: Two rails;Step to pattern;Forwards Number of Stairs: 4 General stair comments: VCs sequencing   Wheelchair Mobility    Modified Rankin (Stroke Patients Only)       Balance Overall balance assessment: Needs assistance   Sitting balance-Leahy Scale: Good     Standing balance support: Bilateral upper extremity supported Standing balance-Leahy Scale: Fair Standing balance comment: BUE support for dynamic standing                            Cognition Arousal/Alertness: Awake/alert Behavior During Therapy: WFL for tasks assessed/performed Overall Cognitive Status: Within Functional Limits for tasks assessed                                        Exercises Total Joint Exercises Ankle Circles/Pumps: AROM;Both;20 reps;Supine Quad Sets: AROM;Both;10 reps;Supine Short Arc Quad: AROM;Left;10 reps;Supine Heel Slides: AAROM;Left;10 reps;Supine Hip ABduction/ADduction: AAROM;Left;10 reps;Supine    General Comments        Pertinent Vitals/Pain Pain Score: 6  Pain Location: L hip with walking Pain Descriptors / Indicators: Sore Pain Intervention(s): Limited activity within patient's tolerance;Monitored during session;Premedicated before session;Ice applied    Home Living                      Prior Function  PT Goals (current goals can now be found in the care plan section) Acute Rehab PT Goals Patient Stated Goal: ride bike PT Goal Formulation: With patient/family Time For Goal Achievement: 10/24/20 Potential to Achieve Goals: Good Progress towards PT goals: Progressing toward goals    Frequency    7X/week      PT Plan Current plan remains appropriate    Co-evaluation               AM-PAC PT "6 Clicks" Mobility   Outcome Measure  Help needed turning from your back to your side while in a flat bed without using bedrails?: A Little Help needed moving from lying on your back to sitting on the side of a flat bed without using bedrails?: A Little Help needed moving to and from a bed to a chair (including a wheelchair)?: A Little Help needed standing up from a chair using your arms (e.g., wheelchair or bedside chair)?: A Little Help needed to walk in hospital room?: A Little Help needed climbing 3-5 steps with a railing? : A Little 6 Click Score: 18    End of Session Equipment Utilized During Treatment: Gait belt Activity Tolerance: Patient tolerated treatment well Patient left: with call bell/phone within reach;with family/visitor present;in chair;with chair alarm set Nurse Communication: Mobility status PT Visit Diagnosis: Difficulty in walking, not elsewhere classified (R26.2);Pain Pain - Right/Left: Left Pain - part of body: Hip     Time: 0921-1017 PT Time Calculation (min) (ACUTE ONLY): 56 min  Charges:  $Gait Training: 23-37 mins $Therapeutic Exercise: 8-22 mins $Therapeutic Activity: 8-22 mins                     Blondell Reveal Kistler PT 10/18/2020  Acute Rehabilitation Services Pager [phone] Office [phone]

## 2020-10-18 NOTE — Progress Notes (Signed)
Subjective: 2 Days Post-Op Procedure(s) (LRB): LEFT TOTAL HIP ARTHROPLASTY ANTERIOR APPROACH (Left) Patient reports pain as mild. The patient reports that she ambulated 140 feet with a rolling walker with no loss of balance or dizziness.  She was ready to be discharged home.   Objective: Vital signs in last 24 hours: Temp:  [98.2 F (36.8 C)-99.1 F (37.3 C)] 99.1 F (37.3 C) (10/13 0730) Pulse Rate:  [68-92] 90 (10/13 0730) Resp:  [16-20] 20 (10/13 0730) BP: (95-135)/(67-90) 135/75 (10/13 0730) SpO2:  [91 %-100 %] 91 % (10/13 0730)  Intake/Output from previous day: 10/12 0701 - 10/13 0700 In: 1726.9 [P.O.:360; I.V.:1366.9] Out: 1450 [Urine:1450] Intake/Output this shift: Total I/O In: 350 [P.O.:350] Out: 600 [Urine:600]  Recent Labs    10/17/20 1439 10/18/20 0845  HGB 10.2* 9.9*   Recent Labs    10/17/20 1439 10/18/20 0845  WBC 7.1 9.2  RBC 3.15* 3.03*  HCT 30.5* 29.6*  PLT 146* 146*   Recent Labs    10/16/20 1034 10/17/20 1439  NA 135 142  K 3.9 3.5  CL 104 110  CO2 24 26  BUN 9 12  CREATININE 0.50 0.69  GLUCOSE 90 127*  CALCIUM 8.7* 8.6*   Recent Labs    10/16/20 1034  INR 0.9    Dorsiflexion/Plantar flexion intact Incision: dressing C/D/I No cellulitis present Compartment soft   Assessment/Plan: 2 Days Post-Op Procedure(s) (LRB): LEFT TOTAL HIP ARTHROPLASTY ANTERIOR APPROACH (Left) Discharge home with home health   Anticipated LOS equal to or greater than 2 midnights due to - Age 72 and older with one or more of the following:  - Obesity  - Expected need for hospital services (PT, OT, Nursing) required for safe  discharge  - Anticipated need for postoperative skilled nursing care or inpatient rehab  - Active co-morbidities: Dizziness with low blood pressure upon ambulation postoperatively. OR   - Unanticipated findings during/Post Surgery: Slow post-op progression: GI, pain control, mobility  - Patient is a high risk of re-admission  due to: None   Erlene Senters 10/18/2020, 12:48 PM

## 2020-10-22 ENCOUNTER — Encounter (HOSPITAL_COMMUNITY): Payer: Self-pay | Admitting: Orthopaedic Surgery

## 2020-10-22 DIAGNOSIS — R262 Difficulty in walking, not elsewhere classified: Secondary | ICD-10-CM | POA: Diagnosis not present

## 2020-10-22 DIAGNOSIS — Z96642 Presence of left artificial hip joint: Secondary | ICD-10-CM | POA: Diagnosis not present

## 2020-10-22 DIAGNOSIS — R531 Weakness: Secondary | ICD-10-CM | POA: Diagnosis not present

## 2020-10-22 DIAGNOSIS — M25552 Pain in left hip: Secondary | ICD-10-CM | POA: Diagnosis not present

## 2020-10-25 DIAGNOSIS — M25552 Pain in left hip: Secondary | ICD-10-CM | POA: Diagnosis not present

## 2020-10-25 DIAGNOSIS — R262 Difficulty in walking, not elsewhere classified: Secondary | ICD-10-CM | POA: Diagnosis not present

## 2020-10-25 DIAGNOSIS — R531 Weakness: Secondary | ICD-10-CM | POA: Diagnosis not present

## 2020-10-25 DIAGNOSIS — Z96642 Presence of left artificial hip joint: Secondary | ICD-10-CM | POA: Diagnosis not present

## 2020-10-29 DIAGNOSIS — Z9889 Other specified postprocedural states: Secondary | ICD-10-CM | POA: Diagnosis not present

## 2020-10-30 DIAGNOSIS — M25552 Pain in left hip: Secondary | ICD-10-CM | POA: Diagnosis not present

## 2020-10-30 DIAGNOSIS — Z96642 Presence of left artificial hip joint: Secondary | ICD-10-CM | POA: Diagnosis not present

## 2020-10-30 DIAGNOSIS — R531 Weakness: Secondary | ICD-10-CM | POA: Diagnosis not present

## 2020-10-30 DIAGNOSIS — R262 Difficulty in walking, not elsewhere classified: Secondary | ICD-10-CM | POA: Diagnosis not present

## 2020-11-01 DIAGNOSIS — Z96642 Presence of left artificial hip joint: Secondary | ICD-10-CM | POA: Diagnosis not present

## 2020-11-01 DIAGNOSIS — M25552 Pain in left hip: Secondary | ICD-10-CM | POA: Diagnosis not present

## 2020-11-01 DIAGNOSIS — R262 Difficulty in walking, not elsewhere classified: Secondary | ICD-10-CM | POA: Diagnosis not present

## 2020-11-01 DIAGNOSIS — R531 Weakness: Secondary | ICD-10-CM | POA: Diagnosis not present

## 2020-11-05 DIAGNOSIS — Z96642 Presence of left artificial hip joint: Secondary | ICD-10-CM | POA: Diagnosis not present

## 2020-11-05 DIAGNOSIS — R262 Difficulty in walking, not elsewhere classified: Secondary | ICD-10-CM | POA: Diagnosis not present

## 2020-11-05 DIAGNOSIS — R531 Weakness: Secondary | ICD-10-CM | POA: Diagnosis not present

## 2020-11-05 DIAGNOSIS — M25552 Pain in left hip: Secondary | ICD-10-CM | POA: Diagnosis not present

## 2020-11-08 DIAGNOSIS — M25552 Pain in left hip: Secondary | ICD-10-CM | POA: Diagnosis not present

## 2020-11-08 DIAGNOSIS — R531 Weakness: Secondary | ICD-10-CM | POA: Diagnosis not present

## 2020-11-08 DIAGNOSIS — R262 Difficulty in walking, not elsewhere classified: Secondary | ICD-10-CM | POA: Diagnosis not present

## 2020-11-08 DIAGNOSIS — Z96642 Presence of left artificial hip joint: Secondary | ICD-10-CM | POA: Diagnosis not present

## 2020-11-12 DIAGNOSIS — Z96642 Presence of left artificial hip joint: Secondary | ICD-10-CM | POA: Diagnosis not present

## 2020-11-12 DIAGNOSIS — M25552 Pain in left hip: Secondary | ICD-10-CM | POA: Diagnosis not present

## 2020-11-12 DIAGNOSIS — R531 Weakness: Secondary | ICD-10-CM | POA: Diagnosis not present

## 2020-11-12 DIAGNOSIS — R262 Difficulty in walking, not elsewhere classified: Secondary | ICD-10-CM | POA: Diagnosis not present

## 2020-11-20 DIAGNOSIS — M25552 Pain in left hip: Secondary | ICD-10-CM | POA: Diagnosis not present

## 2020-11-20 DIAGNOSIS — Z96642 Presence of left artificial hip joint: Secondary | ICD-10-CM | POA: Diagnosis not present

## 2020-11-20 DIAGNOSIS — R531 Weakness: Secondary | ICD-10-CM | POA: Diagnosis not present

## 2020-11-20 DIAGNOSIS — R262 Difficulty in walking, not elsewhere classified: Secondary | ICD-10-CM | POA: Diagnosis not present

## 2020-11-22 DIAGNOSIS — Z96642 Presence of left artificial hip joint: Secondary | ICD-10-CM | POA: Diagnosis not present

## 2020-11-22 DIAGNOSIS — M25552 Pain in left hip: Secondary | ICD-10-CM | POA: Diagnosis not present

## 2020-11-22 DIAGNOSIS — R531 Weakness: Secondary | ICD-10-CM | POA: Diagnosis not present

## 2020-11-22 DIAGNOSIS — R262 Difficulty in walking, not elsewhere classified: Secondary | ICD-10-CM | POA: Diagnosis not present

## 2020-11-24 ENCOUNTER — Other Ambulatory Visit: Payer: Self-pay | Admitting: Internal Medicine

## 2020-12-06 DIAGNOSIS — Z96642 Presence of left artificial hip joint: Secondary | ICD-10-CM | POA: Diagnosis not present

## 2020-12-06 DIAGNOSIS — R531 Weakness: Secondary | ICD-10-CM | POA: Diagnosis not present

## 2020-12-06 DIAGNOSIS — R262 Difficulty in walking, not elsewhere classified: Secondary | ICD-10-CM | POA: Diagnosis not present

## 2020-12-06 DIAGNOSIS — M25552 Pain in left hip: Secondary | ICD-10-CM | POA: Diagnosis not present

## 2020-12-07 ENCOUNTER — Ambulatory Visit: Payer: Medicare Other | Admitting: Family Medicine

## 2020-12-07 ENCOUNTER — Other Ambulatory Visit: Payer: Self-pay

## 2020-12-07 ENCOUNTER — Ambulatory Visit: Payer: Self-pay

## 2020-12-07 VITALS — BP 124/80 | HR 70 | Ht 62.0 in | Wt 150.0 lb

## 2020-12-07 DIAGNOSIS — I8002 Phlebitis and thrombophlebitis of superficial vessels of left lower extremity: Secondary | ICD-10-CM | POA: Diagnosis not present

## 2020-12-07 DIAGNOSIS — M25572 Pain in left ankle and joints of left foot: Secondary | ICD-10-CM

## 2020-12-07 NOTE — Progress Notes (Signed)
   I, Peterson Lombard, LAT, ATC acting as a scribe for Lynne Leader, MD.  Ellen [name] is a 70 y.o. female who presents to [place] [facility] today for L ankle pain. Pt was previously seen by Dr. Tamala Julian on 08/29/20 for L hip pain, which she had replaced on 10/16/20. Today, pt c/o L ankle pain ongoing since 11/23 after a long drive to [place]. Pt locates pain to the medial aspect of the L ankle. Pt expressed concern about a possible blood clot. Pt notes she only had pain when touching the area.  Her pain is improving since yesterday and the day before.  L ankle swelling: yes Aggravates: nothing  Treatments tried: aspirin,   Pertinent review of systems: No fevers or chills.  No significant leg swelling.  No shortness of breath chest pain or palpitations.  Relevant historical information: No history of DVT.   Exam:  BP 124/80   Pulse 70   Ht 5\' 2"  (1.575 m)   Wt 150 lb (68 kg)   SpO2 98%   BMI 27.44 kg/m  General: Well Developed, well nourished, and in no acute distress.   MSK: Left ankle normal-appearing no swelling. Mildly tender palpation anterior medial calf overlying medial malleolus.  No palpable cord. Left calf no calf swelling.  Posterior calf is nontender.  No erythema.  Negative Hoffman's test.     Lab and Radiology Results  Diagnostic Limited MSK Ultrasound of: Left medial ankle area of tenderness Ultrasound evaluation of area of pain at medial ankle reveals a noncompressible vein consistent in appearance with a superficial venous thrombosis. Bony structure normal-appearing Posterior deep veins compressible Impression: Superficial venous thrombophlebitis      Assessment and Plan: 70 y.o. female with left leg pain at anterior medial ankle consistent with superficial venous thrombophlebitis.  Patient is already improving.  DVT extremely unlikely given absence of calf swelling erythema or Hoffmann sign. Plan to treat conservatively with topical  NSAIDs and compression.  If worsening or patient develops more concerning symptoms she will contact me and I will arrange for a duplex ultrasound to evaluate for DVT.   PDMP not reviewed this encounter. Orders Placed This Encounter  Procedures   [place] LIMITED JOINT SPACE STRUCTURES LOW LEFT(NO LINKED CHARGES)    Standing Status:   Future    Number of Occurrences:   1    Standing Expiration Date:   06/07/2021    Order Specific Question:   Reason for Exam (SYMPTOM  OR DIAGNOSIS REQUIRED)    Answer:   left ankle pain    Order Specific Question:   Preferred imaging location?    Answer:   [facility]   No orders of the defined types were placed in this encounter.    Discussed warning signs or symptoms. Please see discharge instructions. Patient expresses understanding.   The above documentation has been reviewed and is accurate and complete Lynne Leader, M.D.

## 2020-12-07 NOTE — Patient Instructions (Signed)
Thank you for coming in today.   I think this is a superficial thrombophlebitis.   Compression and voltaren gel.   If your leg is not doing well let me know and I will order the ultrasound.  Please use Voltaren gel (Generic Diclofenac Gel) up to 4x daily for pain as needed.  This is available over-the-counter as both the name brand Voltaren gel and the generic diclofenac gel.   Thrombophlebitis Thrombophlebitis is a condition in which a blood clot and inflammation occur inside a vein. This can happen in the arms, legs, or torso. Thrombophlebitis may involve superficial veins, deep veins, or both. Superficial veins are close to the surface of the body and are part of the superficial venous system. Veins that are deeper inside the body are part of the deep venous system. When this condition happens in a superficial vein (superficial thrombophlebitis), it is usually not serious.However, when the condition happens in a vein that is part of the deep venous system (deep vein thrombosis, DVT), it can cause serious problems. What are the causes? This condition may be caused by: Infection, injury, or trauma to a vein. Inflammation of the veins. Medical conditions that can cause blood to clot more easily (hypercoagulable state). Backing up, or reflux, of blood flow through the veins (chronic venous insufficiency or venous stasis). What increases the risk? The following factors may make you more likely to develop this condition: Having a long, thin tube (catheter) put in a vein, such as a central line, port, or IV catheter. Getting certain medicines through a catheter that can irritate the vein. Pregnancy or having recently given birth. Cancer. Obesity. Taking oral contraceptive pills (OCPs) or hormone therapy (HT) medicines. Spasms of veins. Immobilization, or not moving the limbs for prolonged periods. What are the signs or symptoms? The main symptoms of this condition are: Swelling and pain in  an arm or leg. If the affected vein is in the leg, you may feel pain while standing or walking. Warmth or redness in an arm or leg. Tenderness in the affected area when it is touched. Other symptoms include: Low-grade fever. Muscle aches. A bulging or hard vein (venous distension). In some cases, there are no symptoms. How is this diagnosed? This condition may be diagnosed based on: Your symptoms and medical history. A physical exam. Tests, such as a test that uses sound waves to make images (duplex ultrasound). How is this treated? Treatment depends on how severe the condition is and which area of the body is affected. Treatment may include: Applying a warm compress or heating pad to affected areas. This may need to be repeated several times a day. Moving the affected limb. For example, you may need to start doing walking exercises right away. You will also be encouraged to continue your usual daily activities. Raising (elevating) the affected arm or leg above the level of your heart. Medicines, such as: Anti-inflammatory medicines, such as ibuprofen. Blood thinners (anticoagulants) or anti-platelet drugs such as aspirin. Removing an IV or central line that may be causing the problem. Wearing compression stockings to help prevent blood clots and reduce swelling in your legs. Follow these instructions at home: Medicines Take over-the-counter and prescription medicines only as told by your health care provider. If you are taking blood thinners: Talk with your health care provider before you take any medicines that contain aspirin or NSAIDs, such as ibuprofen. These medicines increase your risk for dangerous bleeding. Take your medicine exactly as told, at the same  time every day. Avoid activities that could cause injury or bruising, and follow instructions about how to prevent falls. Wear a medical alert bracelet or carry a card that lists what medicines you take. Managing pain,  stiffness, and swelling  If directed, apply heat to the affected area as often as told by your health care provider. Use the heat source that your health care provider recommends, such as a moist heat pack or a heating pad. Place a towel between your skin and the heat source. Leave the heat on for 20-30 minutes. Remove the heat if your skin turns bright red. This is especially important if you are unable to feel pain, heat, or cold. You have a greater risk of getting burned. Elevate the affected area above the level of your heart while you are sitting or lying down. Activity Return to your normal activities as told by your health care provider. Ask your health care provider what activities are safe for you. Avoid sitting for a long time without moving. Get up to take short walks every 1-2 hours. This is important to improve blood flow and breathing. Ask for help if you feel weak or unsteady. Do exercises as told by your health care provider. Rest as told by your health care provider. General instructions Drink enough fluid to keep your urine pale yellow. Wear compression stockings as told by your health care provider. Do not use any products that contain nicotine or tobacco. These products include cigarettes, chewing tobacco, and vaping devices, such as e-cigarettes. If you need help quitting, ask your health care provider. Keep all follow-up visits. This is important. Contact a health care provider if: You miss a dose of your blood thinner, if applicable. Your symptoms do not improve. You have unusual bruising. You have nausea, vomiting, or diarrhea that lasts for more than a day. Get help right away if: You are breathing fast or have chest pain. You have blood in your vomit, urine, or stool. You have severe pain in your affected arm or leg or new pain in any arm or leg. You have light-headedness, dizziness, a severe headache, or confusion. These symptoms may represent a serious problem  that is an emergency. Do not wait to see if the symptoms will go away. Get medical help right away. Call your local emergency services (911 in the [place] Do not drive yourself to the hospital. Summary Thrombophlebitis is a condition in which a blood clot forms in a vein, causing inflammation and often pain. This can happen in both superficial and deep veins. The main symptom of this condition is swelling and pain around the affected vein. Tenderness and redness may also be present. Treatment may include warm compresses, compression stockings, anti-inflammatory medicines, or blood thinners. Make sure you take all medicines, especially blood thinners, as instructed and keep all follow-up visits to ensure proper healing of the vein. This information is not intended to replace advice given to you by your health care provider. Make sure you discuss any questions you have with your health care provider. Document Revised: 06/18/2020 Document Reviewed: 06/18/2020 Elsevier Patient Education  [place].

## 2020-12-11 DIAGNOSIS — Z96642 Presence of left artificial hip joint: Secondary | ICD-10-CM | POA: Diagnosis not present

## 2020-12-11 DIAGNOSIS — R531 Weakness: Secondary | ICD-10-CM | POA: Diagnosis not present

## 2020-12-11 DIAGNOSIS — M25552 Pain in left hip: Secondary | ICD-10-CM | POA: Diagnosis not present

## 2020-12-11 DIAGNOSIS — R262 Difficulty in walking, not elsewhere classified: Secondary | ICD-10-CM | POA: Diagnosis not present

## 2020-12-12 DIAGNOSIS — H43813 Vitreous degeneration, bilateral: Secondary | ICD-10-CM | POA: Diagnosis not present

## 2020-12-12 DIAGNOSIS — H04123 Dry eye syndrome of bilateral lacrimal glands: Secondary | ICD-10-CM | POA: Diagnosis not present

## 2020-12-12 DIAGNOSIS — H25811 Combined forms of age-related cataract, right eye: Secondary | ICD-10-CM | POA: Diagnosis not present

## 2020-12-13 DIAGNOSIS — R531 Weakness: Secondary | ICD-10-CM | POA: Diagnosis not present

## 2020-12-13 DIAGNOSIS — M25552 Pain in left hip: Secondary | ICD-10-CM | POA: Diagnosis not present

## 2020-12-13 DIAGNOSIS — R262 Difficulty in walking, not elsewhere classified: Secondary | ICD-10-CM | POA: Diagnosis not present

## 2020-12-13 DIAGNOSIS — Z96642 Presence of left artificial hip joint: Secondary | ICD-10-CM | POA: Diagnosis not present

## 2020-12-18 DIAGNOSIS — Z96642 Presence of left artificial hip joint: Secondary | ICD-10-CM | POA: Diagnosis not present

## 2020-12-18 DIAGNOSIS — R531 Weakness: Secondary | ICD-10-CM | POA: Diagnosis not present

## 2020-12-18 DIAGNOSIS — M25552 Pain in left hip: Secondary | ICD-10-CM | POA: Diagnosis not present

## 2020-12-18 DIAGNOSIS — R262 Difficulty in walking, not elsewhere classified: Secondary | ICD-10-CM | POA: Diagnosis not present

## 2020-12-19 DIAGNOSIS — L02821 Furuncle of head [any part, except face]: Secondary | ICD-10-CM | POA: Diagnosis not present

## 2020-12-20 DIAGNOSIS — R531 Weakness: Secondary | ICD-10-CM | POA: Diagnosis not present

## 2020-12-20 DIAGNOSIS — M25552 Pain in left hip: Secondary | ICD-10-CM | POA: Diagnosis not present

## 2020-12-20 DIAGNOSIS — Z96642 Presence of left artificial hip joint: Secondary | ICD-10-CM | POA: Diagnosis not present

## 2020-12-20 DIAGNOSIS — R262 Difficulty in walking, not elsewhere classified: Secondary | ICD-10-CM | POA: Diagnosis not present

## 2020-12-25 DIAGNOSIS — R262 Difficulty in walking, not elsewhere classified: Secondary | ICD-10-CM | POA: Diagnosis not present

## 2020-12-25 DIAGNOSIS — R531 Weakness: Secondary | ICD-10-CM | POA: Diagnosis not present

## 2020-12-25 DIAGNOSIS — M25552 Pain in left hip: Secondary | ICD-10-CM | POA: Diagnosis not present

## 2020-12-25 DIAGNOSIS — Z96642 Presence of left artificial hip joint: Secondary | ICD-10-CM | POA: Diagnosis not present

## 2021-01-08 DIAGNOSIS — M25552 Pain in left hip: Secondary | ICD-10-CM | POA: Diagnosis not present

## 2021-01-08 DIAGNOSIS — R262 Difficulty in walking, not elsewhere classified: Secondary | ICD-10-CM | POA: Diagnosis not present

## 2021-01-08 DIAGNOSIS — R531 Weakness: Secondary | ICD-10-CM | POA: Diagnosis not present

## 2021-01-08 DIAGNOSIS — Z96642 Presence of left artificial hip joint: Secondary | ICD-10-CM | POA: Diagnosis not present

## 2021-01-10 DIAGNOSIS — R262 Difficulty in walking, not elsewhere classified: Secondary | ICD-10-CM | POA: Diagnosis not present

## 2021-01-10 DIAGNOSIS — M25552 Pain in left hip: Secondary | ICD-10-CM | POA: Diagnosis not present

## 2021-01-10 DIAGNOSIS — Z96642 Presence of left artificial hip joint: Secondary | ICD-10-CM | POA: Diagnosis not present

## 2021-01-10 DIAGNOSIS — R531 Weakness: Secondary | ICD-10-CM | POA: Diagnosis not present

## 2021-01-11 ENCOUNTER — Encounter: Payer: Self-pay | Admitting: Internal Medicine

## 2021-01-11 ENCOUNTER — Other Ambulatory Visit: Payer: Self-pay | Admitting: Internal Medicine

## 2021-01-11 NOTE — Telephone Encounter (Signed)
Duplicate rx already sent.Marland KitchenJohny [name]

## 2021-01-14 ENCOUNTER — Other Ambulatory Visit: Payer: Self-pay | Admitting: Internal Medicine

## 2021-01-14 MED ORDER — BENZONATATE 100 MG PO CAPS: ORAL_CAPSULE | ORAL | 0 refills | Status: DC

## 2021-01-14 MED ORDER — CEPHALEXIN 500 MG PO CAPS: 500.0000 mg | ORAL_CAPSULE | Freq: Four times a day (QID) | ORAL | 1 refills | Status: DC

## 2021-01-15 DIAGNOSIS — M25552 Pain in left hip: Secondary | ICD-10-CM | POA: Diagnosis not present

## 2021-01-15 DIAGNOSIS — R262 Difficulty in walking, not elsewhere classified: Secondary | ICD-10-CM | POA: Diagnosis not present

## 2021-01-15 DIAGNOSIS — R531 Weakness: Secondary | ICD-10-CM | POA: Diagnosis not present

## 2021-01-15 DIAGNOSIS — Z96642 Presence of left artificial hip joint: Secondary | ICD-10-CM | POA: Diagnosis not present

## 2021-01-17 DIAGNOSIS — Z96642 Presence of left artificial hip joint: Secondary | ICD-10-CM | POA: Diagnosis not present

## 2021-01-17 DIAGNOSIS — R531 Weakness: Secondary | ICD-10-CM | POA: Diagnosis not present

## 2021-01-17 DIAGNOSIS — R262 Difficulty in walking, not elsewhere classified: Secondary | ICD-10-CM | POA: Diagnosis not present

## 2021-01-17 DIAGNOSIS — M25552 Pain in left hip: Secondary | ICD-10-CM | POA: Diagnosis not present

## 2021-01-18 DIAGNOSIS — H25811 Combined forms of age-related cataract, right eye: Secondary | ICD-10-CM | POA: Diagnosis not present

## 2021-01-29 DIAGNOSIS — Z1231 Encounter for screening mammogram for malignant neoplasm of breast: Secondary | ICD-10-CM | POA: Diagnosis not present

## 2021-01-30 DIAGNOSIS — H2512 Age-related nuclear cataract, left eye: Secondary | ICD-10-CM | POA: Diagnosis not present

## 2021-01-31 DIAGNOSIS — R262 Difficulty in walking, not elsewhere classified: Secondary | ICD-10-CM | POA: Diagnosis not present

## 2021-01-31 DIAGNOSIS — M25552 Pain in left hip: Secondary | ICD-10-CM | POA: Diagnosis not present

## 2021-01-31 DIAGNOSIS — Z96642 Presence of left artificial hip joint: Secondary | ICD-10-CM | POA: Diagnosis not present

## 2021-01-31 DIAGNOSIS — R531 Weakness: Secondary | ICD-10-CM | POA: Diagnosis not present

## 2021-02-01 DIAGNOSIS — H25812 Combined forms of age-related cataract, left eye: Secondary | ICD-10-CM | POA: Diagnosis not present

## 2021-02-07 DIAGNOSIS — R262 Difficulty in walking, not elsewhere classified: Secondary | ICD-10-CM | POA: Diagnosis not present

## 2021-02-07 DIAGNOSIS — M25552 Pain in left hip: Secondary | ICD-10-CM | POA: Diagnosis not present

## 2021-02-07 DIAGNOSIS — R531 Weakness: Secondary | ICD-10-CM | POA: Diagnosis not present

## 2021-02-07 DIAGNOSIS — Z96642 Presence of left artificial hip joint: Secondary | ICD-10-CM | POA: Diagnosis not present

## 2021-02-11 ENCOUNTER — Other Ambulatory Visit: Payer: Self-pay | Admitting: Internal Medicine

## 2021-02-11 DIAGNOSIS — M25552 Pain in left hip: Secondary | ICD-10-CM | POA: Diagnosis not present

## 2021-02-11 DIAGNOSIS — R262 Difficulty in walking, not elsewhere classified: Secondary | ICD-10-CM | POA: Diagnosis not present

## 2021-02-11 DIAGNOSIS — R531 Weakness: Secondary | ICD-10-CM | POA: Diagnosis not present

## 2021-02-11 DIAGNOSIS — Z96642 Presence of left artificial hip joint: Secondary | ICD-10-CM | POA: Diagnosis not present

## 2021-03-04 ENCOUNTER — Ambulatory Visit (INDEPENDENT_AMBULATORY_CARE_PROVIDER_SITE_OTHER): Payer: Medicare Other | Admitting: Internal Medicine

## 2021-03-04 ENCOUNTER — Encounter: Payer: Self-pay | Admitting: Internal Medicine

## 2021-03-04 ENCOUNTER — Other Ambulatory Visit: Payer: Self-pay

## 2021-03-04 ENCOUNTER — Other Ambulatory Visit: Payer: Self-pay | Admitting: Internal Medicine

## 2021-03-04 DIAGNOSIS — M160 Bilateral primary osteoarthritis of hip: Secondary | ICD-10-CM | POA: Diagnosis not present

## 2021-03-04 DIAGNOSIS — I10 Essential (primary) hypertension: Secondary | ICD-10-CM | POA: Diagnosis not present

## 2021-03-04 DIAGNOSIS — R413 Other amnesia: Secondary | ICD-10-CM | POA: Diagnosis not present

## 2021-03-04 DIAGNOSIS — Z92241 Personal history of systemic steroid therapy: Secondary | ICD-10-CM | POA: Insufficient documentation

## 2021-03-04 DIAGNOSIS — K219 Gastro-esophageal reflux disease without esophagitis: Secondary | ICD-10-CM

## 2021-03-04 DIAGNOSIS — F411 Generalized anxiety disorder: Secondary | ICD-10-CM | POA: Diagnosis not present

## 2021-03-04 DIAGNOSIS — K5730 Diverticulosis of large intestine without perforation or abscess without bleeding: Secondary | ICD-10-CM | POA: Insufficient documentation

## 2021-03-04 DIAGNOSIS — K2940 Chronic atrophic gastritis without bleeding: Secondary | ICD-10-CM | POA: Insufficient documentation

## 2021-03-04 DIAGNOSIS — E559 Vitamin D deficiency, unspecified: Secondary | ICD-10-CM | POA: Insufficient documentation

## 2021-03-04 DIAGNOSIS — E063 Autoimmune thyroiditis: Secondary | ICD-10-CM | POA: Insufficient documentation

## 2021-03-04 DIAGNOSIS — K2990 Gastroduodenitis, unspecified, without bleeding: Secondary | ICD-10-CM | POA: Insufficient documentation

## 2021-03-04 LAB — CBC WITH DIFFERENTIAL/PLATELET
Basophils Absolute: 0.1 10*3/uL (ref 0.0–0.1)
Basophils Relative: 0.9 % (ref 0.0–3.0)
Eosinophils Absolute: 0.4 10*3/uL (ref 0.0–0.7)
Eosinophils Relative: 7.1 % — ABNORMAL HIGH (ref 0.0–5.0)
HCT: 38.6 % (ref 36.0–46.0)
Hemoglobin: 12.5 g/dL (ref 12.0–15.0)
Lymphocytes Relative: 35 % (ref 12.0–46.0)
Lymphs Abs: 2.2 10*3/uL (ref 0.7–4.0)
MCHC: 32.4 g/dL (ref 30.0–36.0)
MCV: 84.4 fl (ref 78.0–100.0)
Monocytes Absolute: 0.6 10*3/uL (ref 0.1–1.0)
Monocytes Relative: 8.9 % (ref 3.0–12.0)
Neutro Abs: 3 10*3/uL (ref 1.4–7.7)
Neutrophils Relative %: 48.1 % (ref 43.0–77.0)
Platelets: 245 10*3/uL (ref 150.0–400.0)
RBC: 4.57 Mil/uL (ref 3.87–5.11)
RDW: 16.3 % — ABNORMAL HIGH (ref 11.5–15.5)
WBC: 6.3 10*3/uL (ref 4.0–10.5)

## 2021-03-04 LAB — COMPREHENSIVE METABOLIC PANEL
ALT: 15 U/L (ref 0–35)
AST: 23 U/L (ref 0–37)
Albumin: 4.3 g/dL (ref 3.5–5.2)
Alkaline Phosphatase: 77 U/L (ref 39–117)
BUN: 15 mg/dL (ref 6–23)
CO2: 30 mEq/L (ref 19–32)
Calcium: 9.3 mg/dL (ref 8.4–10.5)
Chloride: 104 mEq/L (ref 96–112)
Creatinine, Ser: 0.75 mg/dL (ref 0.40–1.20)
GFR: 79.52 mL/min (ref >60.00)
Glucose, Bld: 91 mg/dL (ref 70–99)
Potassium: 4.4 mEq/L (ref 3.5–5.1)
Sodium: 140 mEq/L (ref 135–145)
Total Bilirubin: 0.4 mg/dL (ref 0.2–1.2)
Total Protein: 7.3 g/dL (ref 6.0–8.3)

## 2021-03-04 MED ORDER — LEVOTHYROXINE SODIUM 125 MCG PO TABS: 125.0000 ug | ORAL_TABLET | Freq: Every day | ORAL | 3 refills | Status: DC

## 2021-03-04 MED ORDER — CITALOPRAM HYDROBROMIDE 20 MG PO TABS: 10.0000 mg | ORAL_TABLET | Freq: Every day | ORAL | 3 refills | Status: DC

## 2021-03-04 MED ORDER — BENZONATATE 100 MG PO CAPS: ORAL_CAPSULE | ORAL | 1 refills | Status: DC

## 2021-03-04 MED ORDER — PANTOPRAZOLE SODIUM 20 MG PO TBEC: DELAYED_RELEASE_TABLET | ORAL | 3 refills | Status: DC

## 2021-03-04 NOTE — Assessment & Plan Note (Addendum)
Do a memory test at home Try [facility] mane

## 2021-03-04 NOTE — Assessment & Plan Note (Addendum)
S/p THR R 2021,  L 2022 Dr Rhona Raider In PT now

## 2021-03-04 NOTE — Assessment & Plan Note (Signed)
Chronic Citalopram  Potential benefits of a long term SSRI use as well as potential risks  and complications were explained to the patient and were aknowledged.

## 2021-03-04 NOTE — Assessment & Plan Note (Signed)
1/21 Coronary calcium score of 6. This was 22 th percentile for age and sex matched control. On NAS diet

## 2021-03-04 NOTE — Patient Instructions (Signed)
Do a memory test at home - [facility] Try [place] mane

## 2021-03-04 NOTE — Progress Notes (Signed)
Subjective:  Patient ID: Ellen [name], female    DOB: 01-Jan-1951  Age: 71 y.o. MRN: [id number]  CC: No chief complaint on file.   HPI Ellen [name] presents for hip OA, s/p B THR, anxiety, GERD f/u  Outpatient Medications Prior to Visit  Medication Sig Dispense Refill   Ascorbic Acid (SUPER C COMPLEX PO) Take 1 tablet by mouth daily.     aspirin 81 MG chewable tablet 1 tablet     cephALEXin (KEFLEX) 500 MG capsule Take 1 capsule (500 mg total) by mouth 4 (four) times daily. 20 capsule 1   Cholecalciferol (VITAMIN D) 125 MCG (5000 UT) CAPS Take 5,000 Units by mouth in the morning and at bedtime.     COLLAGEN PO Take 1 tablet by mouth daily.     CRANBERRY EXTRACT PO Take 1 capsule by mouth daily.      Cyanocobalamin (B-12) 2500 MCG TABS Take 2,500 mcg by mouth daily.     D-Mannose 500 MG CAPS Take 500 mg by mouth daily.     fexofenadine (ALLEGRA) 180 MG tablet Take 180 mg by mouth daily.     FLUZONE HIGH-DOSE QUADRIVALENT 0.7 ML SUSY      gabapentin (NEURONTIN) 300 MG capsule 1 capsule     lidocaine (LIDODERM) 5 % SMARTSIG:0.5-1 Patch(s) Topical Daily PRN     methocarbamol (ROBAXIN) 500 MG tablet Take 1 tablet (500 mg total) by mouth every 6 (six) hours as needed for muscle spasms. 90 tablet 0   Multiple Vitamin (MULTIVITAMIN) tablet Take 1 tablet by mouth daily.     NON FORMULARY Take 1 capsule by mouth at bedtime. Tart cherry extract     omeprazole (PRILOSEC OTC) 20 MG tablet 1 tablet     OVER THE COUNTER MEDICATION Apply 1 application topically in the morning and at bedtime. Bonne Dolores. Apply to scalp twice a day for hair growth.     PFIZER COVID-19 VAC BIVALENT injection      Probiotic Product (ALIGN) 4 MG CAPS 1 capsule     traZODone (DESYREL) 50 MG tablet TAKE 1 TABLET BY MOUTH AT BEDTIME 90 tablet 1   TURMERIC PO Take 1 capsule by mouth daily.     valACYclovir (VALTREX) 1000 MG tablet Take 2,000 mg by mouth 2 (two) times daily.     Valerian Root 100 MG CAPS Take 100 mg by  mouth at bedtime.     YUVAFEM 10 MCG TABS vaginal tablet Place vaginally.     zinc gluconate 50 MG tablet Take 50 mg by mouth daily.     zolpidem (AMBIEN) 10 MG tablet Take by mouth.     aspirin EC 81 MG tablet Take 1 tablet (81 mg total) by mouth daily. Swallow whole. 45 tablet 0   benzonatate (TESSALON) 100 MG capsule TAKE 1 TO 2 CAPSULES BY MOUTH THREE TIMES DAILY AS NEEDED 60 capsule 0   citalopram (CELEXA) 20 MG tablet Take 1 tablet (20 mg total) by mouth daily. (Patient taking differently: Take 10 mg by mouth daily.) 90 tablet 3   gabapentin (NEURONTIN) 100 MG capsule Take 1 capsule (100 mg total) by mouth 2 (two) times daily. (Patient taking differently: Take 200 mg by mouth See admin instructions. Takes 200mg  in the AM. Takes an 200mg  later in the day if needed for pain.) 180 capsule 3   levothyroxine (SYNTHROID) 125 MCG tablet Take 1 tablet (125 mcg total) by mouth daily. 90 tablet 3   pantoprazole (PROTONIX) 20 MG tablet Take  1 tablet (20 mg total) by mouth daily. 90 tablet 3   pantoprazole (PROTONIX) 20 MG tablet 1 tablet     gabapentin (NEURONTIN) 300 MG capsule TAKE 1 CAPSULE BY MOUTH THREE TIMES DAILY (Patient taking differently: Take 300 mg by mouth at bedtime.) 90 capsule 3   levothyroxine (SYNTHROID) 125 MCG tablet 1 tablet on an empty stomach in the morning     minoxidil (ROGAINE) 2 % external solution Apply 1 application topically 2 (two) times daily. Applies to scalp     traMADol (ULTRAM) 50 MG tablet Take 1-2 tablets (50-100 mg total) by mouth every 6 (six) hours as needed for moderate pain or severe pain (post op pain). (Patient not taking: Reported on 12/07/2020) 40 tablet 0   No facility-administered medications prior to visit.    ROS: Review of Systems  Constitutional:  Negative for activity change, appetite change, chills, fatigue and unexpected weight change.  HENT:  Negative for congestion, mouth sores and sinus pressure.   Eyes:  Negative for visual disturbance.   Respiratory:  Negative for cough and chest tightness.   Gastrointestinal:  Negative for abdominal pain and nausea.  Genitourinary:  Negative for difficulty urinating, frequency and vaginal pain.  Musculoskeletal:  Positive for arthralgias. Negative for back pain and gait problem.  Skin:  Negative for pallor and rash.  Neurological:  Negative for dizziness, tremors, weakness, numbness and headaches.  Psychiatric/Behavioral:  Negative for confusion and sleep disturbance.    Objective:  BP 130/78 (BP Location: Left Arm, Patient Position: Sitting, Cuff Size: Large)    Pulse 70    Temp 98.8 F (37.1 C) (Oral)    Ht 5\' 2"  (1.575 m)    Wt 148 lb (67.1 kg)    SpO2 97%    BMI 27.07 kg/m   BP Readings from Last 3 Encounters:  03/04/21 130/78  12/07/20 124/80  10/18/20 135/75    Wt Readings from Last 3 Encounters:  03/04/21 148 lb (67.1 kg)  12/07/20 150 lb (68 kg)  10/16/20 149 lb 14.6 oz (68 kg)    Physical Exam Constitutional:      General: She is not in acute distress.    Appearance: She is well-developed.  HENT:     Head: Normocephalic.     Right Ear: External ear normal.     Left Ear: External ear normal.     Nose: Nose normal.  Eyes:     General:        Right eye: No discharge.        Left eye: No discharge.     Conjunctiva/sclera: Conjunctivae normal.     Pupils: Pupils are equal, round, and reactive to light.  Neck:     Thyroid: No thyromegaly.     Vascular: No JVD.     Trachea: No tracheal deviation.  Cardiovascular:     Rate and Rhythm: Normal rate and regular rhythm.     Heart sounds: Normal heart sounds.  Pulmonary:     Effort: No respiratory distress.     Breath sounds: No stridor. No wheezing.  Abdominal:     General: Bowel sounds are normal. There is no distension.     Palpations: Abdomen is soft. There is no mass.     Tenderness: There is no abdominal tenderness. There is no guarding or rebound.  Musculoskeletal:        General: No tenderness.      Cervical back: Normal range of motion and neck supple. No rigidity.  Lymphadenopathy:  Cervical: No cervical adenopathy.  Skin:    Findings: No erythema or rash.  Neurological:     Cranial Nerves: No cranial nerve deficit.     Motor: No abnormal muscle tone.     Coordination: Coordination normal.     Gait: Gait abnormal.     Deep Tendon Reflexes: Reflexes normal.  Psychiatric:        Behavior: Behavior normal.        Thought Content: Thought content normal.        Judgment: Judgment normal.  Post B THR  Lab Results  Component Value Date   WBC 9.2 10/18/2020   HGB 9.9 (L) 10/18/2020   HCT 29.6 (L) 10/18/2020   PLT 146 (L) 10/18/2020   GLUCOSE 127 (H) 10/17/2020   CHOL 226 (H) 02/29/2020   TRIG 100.0 02/29/2020   HDL 77.50 02/29/2020   LDLDIRECT 142.6 07/09/2011   LDLCALC 128 (H) 02/29/2020   ALT 12 02/29/2020   AST 19 02/29/2020   NA 142 10/17/2020   K 3.5 10/17/2020   CL 110 10/17/2020   CREATININE 0.69 10/17/2020   BUN 12 10/17/2020   CO2 26 10/17/2020   TSH 2.26 02/29/2020   INR 0.9 10/16/2020   HGBA1C 5.6 10/12/2006    DG C-Arm 1-60 Min-No Report  Result Date: 10/16/2020 Fluoroscopy was utilized by the requesting physician.  No radiographic interpretation.   DG HIP OPERATIVE UNILAT W OR W/O PELVIS LEFT  Result Date: 10/16/2020 CLINICAL DATA:  Left hip arthroplasty EXAM: OPERATIVE LEFT HIP (WITH PELVIS IF PERFORMED) 1 VIEWS TECHNIQUE: Fluoroscopic spot image(s) were submitted for interpretation post-operatively. COMPARISON:  09/22/2020 FINDINGS: Two fluoroscopic images are obtained during the performance of the procedure and are provided for interpretation only. Images demonstrate unremarkable left hip arthroplasty in the expected position without signs of complication. Incidental right hip arthroplasty is unremarkable. FLUOROSCOPY TIME:  17 seconds IMPRESSION: 1. Unremarkable left hip arthroplasty. Electronically Signed   By: Randa Ngo M.D.   On:  10/16/2020 16:11    Assessment & Plan:   Problem List Items Addressed This Visit     Generalized anxiety disorder    Chronic Citalopram  Potential benefits of a long term SSRI use as well as potential risks  and complications were explained to the patient and were aknowledged.      Relevant Medications   citalopram (CELEXA) 20 MG tablet   GERD    Cont on Protonix      Relevant Medications   Probiotic Product (ALIGN) 4 MG CAPS   omeprazole (PRILOSEC OTC) 20 MG tablet   pantoprazole (PROTONIX) 20 MG tablet   Hip osteoarthritis    S/p THR R 2021,  L 2022 Dr Esperanza Richters [facility] now      Relevant Medications   aspirin 81 MG chewable tablet   Hypertension    1/21 Coronary calcium score of 6. This was 53 th percentile for age and sex matched control. On NAS diet      Relevant Medications   aspirin 81 MG chewable tablet      Meds ordered this encounter  Medications   citalopram (CELEXA) 20 MG tablet    Sig: Take 0.5 tablets (10 mg total) by mouth daily.    Dispense:  45 tablet    Refill:  3   levothyroxine (SYNTHROID) 125 MCG tablet    Sig: Take 1 tablet (125 mcg total) by mouth daily.    Dispense:  90 tablet    Refill:  3  pantoprazole (PROTONIX) 20 MG tablet    Sig: 1 tablet    Dispense:  90 tablet    Refill:  3   benzonatate (TESSALON) 100 MG capsule    Sig: TAKE 1 TO 2 CAPSULES BY MOUTH THREE TIMES DAILY AS NEEDED    Dispense:  60 capsule    Refill:  1      Follow-up: Return in about 6 months (around 09/01/2021) for Wellness Exam.  Walker Kehr, MD

## 2021-03-04 NOTE — Assessment & Plan Note (Signed)
Cont on Protonix 

## 2021-03-05 ENCOUNTER — Encounter: Payer: Self-pay | Admitting: Internal Medicine

## 2021-03-05 LAB — IRON,TIBC AND FERRITIN PANEL
%SAT: 16 % (calc) (ref 16–45)
Ferritin: 21 ng/mL (ref 16–288)
Iron: 52 ug/dL (ref 45–160)
TIBC: 332 mcg/dL (calc) (ref 250–450)

## 2021-03-05 LAB — TSH: TSH: 0.49 u[IU]/mL (ref 0.35–5.50)

## 2021-03-06 ENCOUNTER — Encounter: Payer: Self-pay | Admitting: Internal Medicine

## 2021-04-03 DIAGNOSIS — L568 Other specified acute skin changes due to ultraviolet radiation: Secondary | ICD-10-CM | POA: Diagnosis not present

## 2021-04-03 DIAGNOSIS — L570 Actinic keratosis: Secondary | ICD-10-CM | POA: Diagnosis not present

## 2021-06-01 ENCOUNTER — Telehealth: Payer: Medicare Other | Admitting: Physician Assistant

## 2021-06-01 DIAGNOSIS — R197 Diarrhea, unspecified: Secondary | ICD-10-CM | POA: Diagnosis not present

## 2021-06-01 MED ORDER — AZITHROMYCIN 500 MG PO TABS: 500.0000 mg | ORAL_TABLET | Freq: Every day | ORAL | 0 refills | Status: AC

## 2021-06-01 NOTE — Progress Notes (Signed)
We are sorry that you are not feeling well.  Here is how we plan to help!  Based on what you have shared with me it looks like you have Acute Infectious Diarrhea.  Most cases of acute diarrhea are due to infections with virus and bacteria and are self-limited conditions lasting less than 14 days.  For your symptoms you may take Imodium 2 mg tablets that are over the counter at your local pharmacy. Take two tablet now and then one after each loose stool up to 6 a day.  Antibiotics are not needed for most people with diarrhea.  Optional: I have prescribed azithromycin 500 mg daily for 3 days  HOME CARE We recommend changing your diet to help with your symptoms for the next few days. Drink plenty of fluids that contain water salt and sugar. Sports drinks such as Gatorade may help.  You may try broths, soups, bananas, applesauce, soft breads, mashed potatoes or crackers.  You are considered infectious for as long as the diarrhea continues. Hand washing or use of alcohol based hand sanitizers is recommend. It is best to stay out of work or school until your symptoms stop.   GET HELP RIGHT AWAY If you have dark yellow colored urine or do not pass urine frequently you should drink more fluids.   If your symptoms worsen  If you feel like you are going to pass out (faint) You have a new problem  MAKE SURE YOU  Understand these instructions. Will watch your condition. Will get help right away if you are not doing well or get worse.  Thank you for choosing an e-visit.  Your e-visit answers were reviewed by a board certified advanced clinical practitioner to complete your personal care plan. Depending upon the condition, your plan could have included both over the counter or prescription medications.  Please review your pharmacy choice. Make sure the pharmacy is open so you can pick up prescription now. If there is a problem, you may contact your provider through [employer] and have the  prescription routed to another pharmacy.  Your safety is important to [place]. If you have drug allergies check your prescription carefully.   For the next 24 hours you can use MyChart to ask questions about today's visit, request a non-urgent call back, or ask for a work or school excuse. You will get an email in the next two days asking about your experience. I hope that your e-visit has been valuable and will speed your recovery.  This appointment required 5-10 minutes of patient care (this includes precharting, chart review, review of results, face-to-face care, etc.).  Inda Coke PA-C

## 2021-06-04 ENCOUNTER — Telehealth: Payer: Medicare Other | Admitting: Physician Assistant

## 2021-06-04 DIAGNOSIS — R197 Diarrhea, unspecified: Secondary | ICD-10-CM

## 2021-06-04 NOTE — Progress Notes (Signed)
Because you are still having a substantial amount of diarrhea despite treatment previously given, and we are on Day 8 or so of symptom, I feel your condition warrants further evaluation and I recommend that you be seen in a face to face visit.   NOTE: There will be NO CHARGE for this eVisit   If you are having a true medical emergency please call 911.      For an urgent face to face visit, [facility] has six urgent care centers for your convenience:     [facility] [place] [facility] [place] Get Driving Directions [phone] [place] [address], [place] [address]    [facility]) Get Driving Directions [phone] [place], [place] [address]  [facility] [place] [facility]) Get Driving Directions [phone] [address] [place],  [place]  [address]  [facility] Get Driving Directions [phone] [address] [place], [place], [place] [address]   [facility] Get Driving Directions  [phone] [address] [place], [place] [address]   [facility] at  Get Driving Directions [phone] [address], [place],  [address]  Your MyChart E-visit questionnaire answers were reviewed by a board certified advanced clinical practitioner to complete your personal care plan based on your specific symptoms.  Thank you for using e-Visits.

## 2021-06-05 ENCOUNTER — Other Ambulatory Visit: Payer: Self-pay

## 2021-06-05 ENCOUNTER — Emergency Department (HOSPITAL_BASED_OUTPATIENT_CLINIC_OR_DEPARTMENT_OTHER)
Admission: EM | Admit: 2021-06-05 | Discharge: 2021-06-05 | Disposition: A | Discharge status: Home / Self Care | Payer: Medicare Other | Attending: Emergency Medicine | Admitting: Emergency Medicine

## 2021-06-05 ENCOUNTER — Other Ambulatory Visit (HOSPITAL_BASED_OUTPATIENT_CLINIC_OR_DEPARTMENT_OTHER): Payer: Self-pay

## 2021-06-05 ENCOUNTER — Encounter (HOSPITAL_BASED_OUTPATIENT_CLINIC_OR_DEPARTMENT_OTHER): Payer: Self-pay

## 2021-06-05 DIAGNOSIS — R109 Unspecified abdominal pain: Secondary | ICD-10-CM | POA: Insufficient documentation

## 2021-06-05 DIAGNOSIS — R197 Diarrhea, unspecified: Secondary | ICD-10-CM

## 2021-06-05 DIAGNOSIS — Z91010 Allergy to peanuts: Secondary | ICD-10-CM | POA: Insufficient documentation

## 2021-06-05 DIAGNOSIS — Z91040 Latex allergy status: Secondary | ICD-10-CM | POA: Insufficient documentation

## 2021-06-05 DIAGNOSIS — Z7982 Long term (current) use of aspirin: Secondary | ICD-10-CM | POA: Diagnosis not present

## 2021-06-05 DIAGNOSIS — R1110 Vomiting, unspecified: Secondary | ICD-10-CM | POA: Diagnosis not present

## 2021-06-05 LAB — COMPREHENSIVE METABOLIC PANEL
ALT: 49 U/L — ABNORMAL HIGH (ref 0–44)
AST: 40 U/L (ref 15–41)
Albumin: 4.8 g/dL (ref 3.5–5.0)
Alkaline Phosphatase: 169 U/L — ABNORMAL HIGH (ref 38–126)
Anion gap: 11 (ref 5–15)
BUN: 6 mg/dL — ABNORMAL LOW (ref 8–23)
CO2: 27 mmol/L (ref 22–32)
Calcium: 9.7 mg/dL (ref 8.9–10.3)
Chloride: 101 mmol/L (ref 98–111)
Creatinine, Ser: 0.76 mg/dL (ref 0.44–1.00)
GFR, Estimated: >60 mL/min (ref >60)
Glucose, Bld: 98 mg/dL (ref 70–99)
Potassium: 3.3 mmol/L — ABNORMAL LOW (ref 3.5–5.1)
Sodium: 139 mmol/L (ref 135–145)
Total Bilirubin: 0.6 mg/dL (ref 0.3–1.2)
Total Protein: 8.3 g/dL — ABNORMAL HIGH (ref 6.5–8.1)

## 2021-06-05 LAB — CBC WITH DIFFERENTIAL/PLATELET
Abs Immature Granulocytes: 0.01 10*3/uL (ref 0.00–0.07)
Basophils Absolute: 0 10*3/uL (ref 0.0–0.1)
Basophils Relative: 1 %
Eosinophils Absolute: 0.1 10*3/uL (ref 0.0–0.5)
Eosinophils Relative: 2 %
HCT: 46 % (ref 36.0–46.0)
Hemoglobin: 14.9 g/dL (ref 12.0–15.0)
Immature Granulocytes: 0 %
Lymphocytes Relative: 25 %
Lymphs Abs: 1.4 10*3/uL (ref 0.7–4.0)
MCH: 29.7 pg (ref 26.0–34.0)
MCHC: 32.4 g/dL (ref 30.0–36.0)
MCV: 91.6 fL (ref 80.0–100.0)
Monocytes Absolute: 0.6 10*3/uL (ref 0.1–1.0)
Monocytes Relative: 12 %
Neutro Abs: 3.4 10*3/uL (ref 1.7–7.7)
Neutrophils Relative %: 60 %
Platelets: 232 10*3/uL (ref 150–400)
RBC: 5.02 MIL/uL (ref 3.87–5.11)
RDW: 13.4 % (ref 11.5–15.5)
WBC: 5.5 10*3/uL (ref 4.0–10.5)
nRBC: 0 % (ref 0.0–0.2)

## 2021-06-05 LAB — C DIFFICILE QUICK SCREEN W PCR REFLEX
C Diff antigen: NEGATIVE
C Diff interpretation: NOT DETECTED
C Diff toxin: NEGATIVE

## 2021-06-05 LAB — MAGNESIUM: Magnesium: 2.2 mg/dL (ref 1.7–2.4)

## 2021-06-05 MED ORDER — LOPERAMIDE HCL 2 MG PO CAPS
4.0000 mg | ORAL_CAPSULE | Freq: Once | ORAL | Status: AC
  Administered 2021-06-05: 4 mg via ORAL
  Filled 2021-06-05: qty 2

## 2021-06-05 MED ORDER — SODIUM CHLORIDE 0.9 % IV BOLUS
1000.0000 mL | Freq: Once | INTRAVENOUS | Status: AC
  Administered 2021-06-05: 1000 mL via INTRAVENOUS

## 2021-06-05 MED ORDER — ONDANSETRON HCL 4 MG/2ML IJ SOLN
4.0000 mg | Freq: Once | INTRAMUSCULAR | Status: AC
  Administered 2021-06-05: 4 mg via INTRAVENOUS
  Filled 2021-06-05: qty 2

## 2021-06-05 MED ORDER — POTASSIUM CHLORIDE CRYS ER 20 MEQ PO TBCR
40.0000 meq | EXTENDED_RELEASE_TABLET | Freq: Once | ORAL | Status: AC
Start: 2021-06-05 — End: 2021-06-05
  Administered 2021-06-05: 40 meq via ORAL
  Filled 2021-06-05: qty 2

## 2021-06-05 MED ORDER — ONDANSETRON 4 MG PO TBDP
4.0000 mg | ORAL_TABLET | Freq: Three times a day (TID) | ORAL | 0 refills | Status: DC | PRN
  Filled 2021-06-05: qty 10, 4d supply, fill #0

## 2021-06-05 NOTE — ED Provider Notes (Signed)
[facility] EMERGENCY DEPT Provider Note   CSN: [id number] Arrival date & time: 06/05/21  1025     History  Chief Complaint  Patient presents with   Diarrhea   Emesis    Ellen [name] is a 71 y.o. female.  Patient presents chief complaint of 12 days of vomiting and diarrhea.  She describes the vomitus diarrhea is nonbloody.  Is been intermittent for the past 12 days.  She is complaining of some abdominal cramping that is remittent as well but denies any fevers denies any cough.  Her husband is sick with similar symptoms which have been lasting much longer.  Denies any recent travel or antibiotic use.      Home Medications Prior to Admission medications   Medication Sig Start Date End Date Taking? Authorizing Provider  ondansetron (ZOFRAN-ODT) 4 MG disintegrating tablet Take 1 tablet (4 mg total) by mouth every 8 (eight) hours as needed for up to 10 doses for nausea or vomiting. 06/05/21  Yes Zayley Arras, Greggory Brandy, MD  Ascorbic Acid (SUPER C COMPLEX PO) Take 1 tablet by mouth daily.    [provider]  aspirin 81 MG chewable tablet 1 tablet    [provider]  benzonatate (TESSALON) 100 MG capsule TAKE 1 TO 2 CAPSULES BY MOUTH THREE TIMES DAILY AS NEEDED 03/04/21   Plotnikov, Evie Lacks, MD  cephALEXin (KEFLEX) 500 MG capsule Take 1 capsule (500 mg total) by mouth 4 (four) times daily. 01/14/21   Plotnikov, Evie Lacks, MD  Cholecalciferol (VITAMIN D) 125 MCG (5000 UT) CAPS Take 5,000 Units by mouth in the morning and at bedtime.    [provider]  citalopram (CELEXA) 20 MG tablet Take 0.5 tablets (10 mg total) by mouth daily. 03/04/21   Plotnikov, Evie Lacks, MD  COLLAGEN PO Take 1 tablet by mouth daily.    [provider]  CRANBERRY EXTRACT PO Take 1 capsule by mouth daily.     [provider]  Cyanocobalamin (B-12) 2500 MCG TABS Take 2,500 mcg by mouth daily.    [provider]  D-Mannose 500 MG CAPS Take 500 mg by mouth  daily.    [provider]  fexofenadine (ALLEGRA) 180 MG tablet Take 180 mg by mouth daily.    [provider]  FLUZONE HIGH-DOSE QUADRIVALENT 0.7 ML SUSY  10/03/20   [provider]  gabapentin (NEURONTIN) 300 MG capsule 1 capsule    [provider]  levothyroxine (SYNTHROID) 125 MCG tablet Take 1 tablet (125 mcg total) by mouth daily. 03/04/21   Plotnikov, Evie Lacks, MD  lidocaine (LIDODERM) 5 % SMARTSIG:0.5-1 Patch(s) Topical Daily PRN 10/31/20   [provider]  methocarbamol (ROBAXIN) 500 MG tablet TAKE 1 TABLET BY MOUTH ONCE DAILY AS NEEDED FOR MUSCLE SPASM 03/05/21   Plotnikov, Evie Lacks, MD  Multiple Vitamin (MULTIVITAMIN) tablet Take 1 tablet by mouth daily.    [provider]  NON FORMULARY Take 1 capsule by mouth at bedtime. Tart cherry extract    [provider]  omeprazole (PRILOSEC OTC) 20 MG tablet 1 tablet 06/09/17   [provider]  OVER THE COUNTER MEDICATION Apply 1 application topically in the morning and at bedtime. Bonne Dolores. Apply to scalp twice a day for hair growth.    [provider]  pantoprazole (PROTONIX) 20 MG tablet 1 tablet 03/04/21   Plotnikov, Evie Lacks, MD  PFIZER COVID-19 VAC BIVALENT injection  10/03/20   [provider]  Probiotic Product (ALIGN) 4 MG  CAPS 1 capsule    [provider]  traZODone (DESYREL) 50 MG tablet TAKE 1 TABLET BY MOUTH AT BEDTIME 02/14/21   Plotnikov, Evie Lacks, MD  TURMERIC PO Take 1 capsule by mouth daily.    [provider]  valACYclovir (VALTREX) 1000 MG tablet Take 2,000 mg by mouth 2 (two) times daily. 12/19/20   [provider]  Valerian Root 100 MG CAPS Take 100 mg by mouth at bedtime.    [provider]  YUVAFEM 10 MCG TABS vaginal tablet Place vaginally. 12/19/20   [provider]  zinc gluconate 50 MG tablet Take 50 mg by mouth daily.    [provider]  zolpidem (AMBIEN) 10 MG tablet Take by  mouth. 12/19/20   [provider]      Allergies    Belviq [lorcaserin hcl], Demerol [meperidine], Elavil [amitriptyline hcl], Oxycodone, Peanut oil, Shellfish-derived products, Latex, and Medical adhesive remover    Review of Systems   Review of Systems  Constitutional:  Negative for fever.  HENT:  Negative for ear pain.   Eyes:  Negative for pain.  Respiratory:  Negative for cough.   Cardiovascular:  Negative for chest pain.  Gastrointestinal:  Positive for diarrhea and vomiting.  Genitourinary:  Negative for flank pain.  Musculoskeletal:  Negative for back pain.  Skin:  Negative for rash.  Neurological:  Negative for headaches.   Physical Exam Updated Vital Signs BP 116/71   Pulse 68   Temp 98.4 F (36.9 C)   Resp 15   Ht '5\' 2"'$  (1.575 m)   Wt 63.5 kg   SpO2 100%   BMI 25.61 kg/m  Physical Exam Constitutional:      General: She is not in acute distress.    Appearance: Normal appearance.  HENT:     Head: Normocephalic.     Nose: Nose normal.  Eyes:     Extraocular Movements: Extraocular movements intact.  Cardiovascular:     Rate and Rhythm: Normal rate.  Pulmonary:     Effort: Pulmonary effort is normal.  Abdominal:     Tenderness: There is no abdominal tenderness. There is no guarding or rebound.  Musculoskeletal:        General: Normal range of motion.     Cervical back: Normal range of motion.  Neurological:     General: No focal deficit present.     Mental Status: She is alert. Mental status is at baseline.    ED Results / Procedures / Treatments   Labs (all labs ordered are listed, but only abnormal results are displayed) Labs Reviewed  COMPREHENSIVE METABOLIC PANEL - Abnormal; Notable for the following components:      Result Value   Potassium 3.3 (*)    BUN 6 (*)    Total Protein 8.3 (*)    ALT 49 (*)    Alkaline Phosphatase 169 (*)    All other components within normal limits  STOOL CULTURE  C DIFFICILE QUICK SCREEN W PCR REFLEX     OVA + PARASITE EXAM  CBC WITH DIFFERENTIAL/PLATELET  MAGNESIUM    EKG None  Radiology No results found.  Procedures Procedures    Medications Ordered in ED Medications  sodium chloride 0.9 % bolus 1,000 mL (0 mLs Intravenous Stopped 06/05/21 1351)  ondansetron (ZOFRAN) injection 4 mg (4 mg Intravenous Given 06/05/21 1249)  potassium chloride SA (KLOR-CON M) CR tablet 40 mEq (40 mEq Oral Given 06/05/21 1350)  loperamide (IMODIUM) capsule 4 mg (4 mg  Oral Given 06/05/21 1504)    ED Course/ Medical Decision Making/ A&P                           Medical Decision Making Amount and/or Complexity of Data Reviewed Labs: ordered.  Risk Prescription drug management.   History from family at bedside.  Chart review shows ED visit yesterday for diarrhea.  Sinus studies included labs White count normal chemistry shows low potassium which was repleted here in the ER.  Stool culture for C. difficile and culture sent and pending final result.  We will recommend Zofran at home, follow-up with gastroenterology within 3 to 4 days.  Advised immediate return for fevers worsening symptoms or any additional concerns.        Final Clinical Impression(s) / ED Diagnoses Final diagnoses:  Diarrhea, unspecified type    Rx / DC Orders ED Discharge Orders          Ordered    ondansetron (ZOFRAN-ODT) 4 MG disintegrating tablet  Every 8 hours PRN        06/05/21 1508              Luna Fuse, MD 06/05/21 [phone]

## 2021-06-05 NOTE — Discharge Instructions (Signed)
Take Imodium as needed for your diarrhea.  Take the Zofran for vomiting.  Follow-up with gastroenterology or primary care doctor this week.  Return if you have fevers worsening symptoms or cannot keep any fluids down, return back to the ER.

## 2021-06-05 NOTE — ED Triage Notes (Signed)
Pt states she is having N/V/D states this has been occurring for 12 days. States she has vomited 3X past 24 hours/ Diarrhea x6 or 7 episodes.

## 2021-06-07 LAB — O&P RESULT

## 2021-06-07 LAB — OVA + PARASITE EXAM

## 2021-06-09 LAB — STOOL CULTURE: E coli, Shiga toxin Assay: NEGATIVE

## 2021-06-09 LAB — STOOL CULTURE REFLEX - CMPCXR

## 2021-06-09 LAB — STOOL CULTURE REFLEX - RSASHR

## 2021-06-17 NOTE — Progress Notes (Signed)
Ellen [name] [address] [place] Phone: [phone] Subjective:   Ellen [name], am serving as a scribe for Dr. Hulan Saas.  I'm seeing this patient by the request  of:  Ellen [name], Evie Lacks, MD  CC: Left scapular pain  HQI:[id number]  Ellen [name] is a 71 y.o. female coming in with complaint of pain under left scapula. Ache and throbbing today, but very sharp on Saturday. Took '300mg'$  gabapentin twice that day and tylenol. Wants cocktail. Patient states that he is having more pain at the moment.  Describes it as a dull, throbbing aching pain.  Did do more cleaning and likely things that is contributing.  Slowly getting better but still ongoing         Past Medical History:  Diagnosis Date   ALLERGIC RHINITIS    ANXIETY    Arthritis    GERD    Hypothyroidism    INSOMNIA-SLEEP DISORDER-UNSPEC    Osteopenia 01/2016   T score -1.2 FRAX 15%/0.9%   Shingles    Thyroid disease    Past Surgical History:  Procedure Laterality Date   ANKLE FRACTURE SURGERY  2012   R   APPENDECTOMY     BREAST SURGERY     REDUCTION MAMMOPLASTY   CESAREAN SECTION     twice   COSMETIC SURGERY     FACELIFT     s/p   HIP SURGERY Right 12/08/2019   Patient reported   [place] Left 10/16/2020   Procedure: LEFT TOTAL HIP ARTHROPLASTY ANTERIOR APPROACH;  Surgeon: Melrose Nakayama, MD;  Location: [facility];  Service: Orthopedics;  Laterality: Left;   Upper nl TSH w/sx     VAGINAL HYSTERECTOMY  1991   TVH, irregular bleeding   Social History   Socioeconomic History   Marital status: Married    Spouse name: Not on file   Number of children: 2   Years of education: Not on file   Highest education level: Not on file  Occupational History   Occupation: Retired    Fish farm manager: SELF EMPLOYED  Tobacco Use   Smoking status: Never   Smokeless tobacco: Never  Vaping Use   Vaping Use: Never used  Substance and  Sexual Activity   Alcohol use: Yes    Alcohol/week: 10.0 standard drinks of alcohol    Types: 10 Glasses of wine per week    Comment: wine daily   Drug use: No   Sexual activity: Yes    Birth control/protection: Post-menopausal, Surgical    Comment: HYST-1st intercourse 71 yo-Fewer than 5 partners  Other Topics Concern   Not on file  Social History Narrative   Regular exercise   2 biol children-1 disabled with schizophrenia & 1 stepchild   Social Determinants of Health   Financial Resource Strain: Not on file  Food Insecurity: Not on file  Transportation Needs: Not on file  Physical Activity: Not on file  Stress: Not on file  Social Connections: Not on file   Allergies  Allergen Reactions   Belviq [Lorcaserin Hcl] Diarrhea   Demerol [Meperidine] Other (See Comments)    Palpitations. Many years ago.   Elavil [Amitriptyline Hcl] Other (See Comments)    constipation   Oxycodone Other (See Comments)    Confusion, low BP Can take Tramadol   Peanut Oil Itching   Shellfish-Derived Products Itching   Latex Hives, Itching and Rash    Redness    Medical  Adhesive Remover Hives, Itching and Rash   Family History  Problem Relation Age of Onset   Heart disease Father        CAD/CABG in his 30's   Hypertension Mother    Heart failure Mother     Current Outpatient Medications (Endocrine & Metabolic):    levothyroxine (SYNTHROID) 125 MCG tablet, Take 1 tablet (125 mcg total) by mouth daily.   Current Outpatient Medications (Respiratory):    benzonatate (TESSALON) 100 MG capsule, TAKE 1 TO 2 CAPSULES BY MOUTH THREE TIMES DAILY AS NEEDED   fexofenadine (ALLEGRA) 180 MG tablet, Take 180 mg by mouth daily.  Current Outpatient Medications (Analgesics):    aspirin 81 MG chewable tablet, 1 tablet  Current Outpatient Medications (Hematological):    Cyanocobalamin (B-12) 2500 MCG TABS, Take 2,500 mcg by mouth daily.  Current Outpatient Medications (Other):    Ascorbic Acid (SUPER  C COMPLEX PO), Take 1 tablet by mouth daily.   cephALEXin (KEFLEX) 500 MG capsule, Take 1 capsule (500 mg total) by mouth 4 (four) times daily.   Cholecalciferol (VITAMIN D) 125 MCG (5000 UT) CAPS, Take 5,000 Units by mouth in the morning and at bedtime.   citalopram (CELEXA) 20 MG tablet, Take 0.5 tablets (10 mg total) by mouth daily.   COLLAGEN PO, Take 1 tablet by mouth daily.   CRANBERRY EXTRACT PO, Take 1 capsule by mouth daily.    D-Mannose 500 MG CAPS, Take 500 mg by mouth daily.   FLUZONE HIGH-DOSE QUADRIVALENT 0.7 ML SUSY,    gabapentin (NEURONTIN) 300 MG capsule, 1 capsule   lidocaine (LIDODERM) 5 %, SMARTSIG:0.5-1 Patch(s) Topical Daily PRN   methocarbamol (ROBAXIN) 500 MG tablet, TAKE 1 TABLET BY MOUTH ONCE DAILY AS NEEDED FOR MUSCLE SPASM   Multiple Vitamin (MULTIVITAMIN) tablet, Take 1 tablet by mouth daily.   NON FORMULARY, Take 1 capsule by mouth at bedtime. Tart cherry extract   omeprazole (PRILOSEC OTC) 20 MG tablet, 1 tablet   ondansetron (ZOFRAN-ODT) 4 MG disintegrating tablet, Take 1 tablet (4 mg total) by mouth every 8 (eight) hours as needed for up to 10 doses for nausea or vomiting.   OVER THE COUNTER MEDICATION, Apply 1 application topically in the morning and at bedtime. Bonne Dolores. Apply to scalp twice a day for hair growth.   pantoprazole (PROTONIX) 20 MG tablet, 1 tablet   PFIZER COVID-19 VAC BIVALENT injection,    Probiotic Product (ALIGN) 4 MG CAPS, 1 capsule   traZODone (DESYREL) 50 MG tablet, TAKE 1 TABLET BY MOUTH AT BEDTIME   TURMERIC PO, Take 1 capsule by mouth daily.   valACYclovir (VALTREX) 1000 MG tablet, Take 2,000 mg by mouth 2 (two) times daily.   Valerian Root 100 MG CAPS, Take 100 mg by mouth at bedtime.   YUVAFEM 10 MCG TABS vaginal tablet, Place vaginally.   zinc gluconate 50 MG tablet, Take 50 mg by mouth daily.   zolpidem (AMBIEN) 10 MG tablet, Take by mouth.   Reviewed prior external information including notes and imaging from  primary  care provider As well as notes that were available from care everywhere and other healthcare systems.  Past medical history, social, surgical and family history all reviewed in electronic medical record.  No pertanent information unless stated regarding to the chief complaint.   Review of Systems:  No headache, visual changes, nausea, vomiting, diarrhea, constipation, dizziness, abdominal pain, skin rash, fevers, chills, night sweats, weight loss, swollen lymph nodes,joint swelling, chest pain, shortness of breath, mood changes.  POSITIVE muscle aches, body aches  Objective  Blood pressure 112/74, pulse 64, height '5\' 2"'$  (1.575 m), weight 146 lb (66.2 kg), SpO2 95 %.   General: No apparent distress alert and oriented x3 mood and affect normal, dressed appropriately.  HEENT: Pupils equal, extraocular movements intact  Respiratory: Patient's speak in full sentences and does not appear short of breath  Cardiovascular: No lower extremity edema, non tender, no erythema  Scapular area does have tenderness to palpation in multiple trigger points.  Patient does have good range of motion though otherwise noted. Patient does have limited sidebending.  Negative Spurling's.  Patient able to take a deep breath without any significant pain.    Impression and Recommendations:

## 2021-06-18 ENCOUNTER — Ambulatory Visit: Payer: Medicare Other | Admitting: Family Medicine

## 2021-06-18 VITALS — BP 112/74 | HR 64 | Ht 62.0 in | Wt 146.0 lb

## 2021-06-18 DIAGNOSIS — M898X1 Other specified disorders of bone, shoulder: Secondary | ICD-10-CM | POA: Diagnosis not present

## 2021-06-18 DIAGNOSIS — M25512 Pain in left shoulder: Secondary | ICD-10-CM | POA: Diagnosis not present

## 2021-06-18 DIAGNOSIS — G8929 Other chronic pain: Secondary | ICD-10-CM | POA: Diagnosis not present

## 2021-06-18 MED ORDER — METHYLPREDNISOLONE ACETATE 40 MG/ML IJ SUSP
40.0000 mg | Freq: Once | INTRAMUSCULAR | Status: AC
  Administered 2021-06-18: 40 mg via INTRAMUSCULAR

## 2021-06-18 MED ORDER — KETOROLAC TROMETHAMINE 30 MG/ML IJ SOLN
30.0000 mg | Freq: Once | INTRAMUSCULAR | Status: AC
  Administered 2021-06-18: 30 mg via INTRAMUSCULAR

## 2021-06-18 NOTE — Assessment & Plan Note (Addendum)
Likely acutely worse with patient doing repetitive activity.  Multiple trigger points noted.  Patient did want to do the Toradol and Depo-Medrol which patient has responded to previously.  Patient given home exercises for more stability.  I believe the patient will do relatively well.  Follow-up with me in 6 to 8 weeks.  Patient knows of any worsening pain, shortness of breath to seek medical attention immediately.  Declined x-ray today

## 2021-06-18 NOTE — Patient Instructions (Signed)
Injection today Do prescribed exercises at least 3x a week See you again in 4-6 weeks

## 2021-07-15 ENCOUNTER — Other Ambulatory Visit: Payer: Self-pay | Admitting: Internal Medicine

## 2021-08-21 ENCOUNTER — Other Ambulatory Visit: Payer: Self-pay | Admitting: Internal Medicine

## 2021-09-03 ENCOUNTER — Ambulatory Visit (INDEPENDENT_AMBULATORY_CARE_PROVIDER_SITE_OTHER): Payer: Medicare Other | Admitting: Internal Medicine

## 2021-09-03 ENCOUNTER — Encounter: Payer: Self-pay | Admitting: Internal Medicine

## 2021-09-03 VITALS — BP 130/85 | HR 57 | Temp 98.2°F | Ht 62.0 in | Wt 146.0 lb

## 2021-09-03 DIAGNOSIS — E034 Atrophy of thyroid (acquired): Secondary | ICD-10-CM

## 2021-09-03 DIAGNOSIS — I9581 Postprocedural hypotension: Secondary | ICD-10-CM

## 2021-09-03 LAB — CBC WITH DIFFERENTIAL/PLATELET
Basophils Absolute: 0.1 10*3/uL (ref 0.0–0.1)
Basophils Relative: 1 % (ref 0.0–3.0)
Eosinophils Absolute: 0.2 10*3/uL (ref 0.0–0.7)
Eosinophils Relative: 4.6 % (ref 0.0–5.0)
HCT: 40.4 % (ref 36.0–46.0)
Hemoglobin: 13.4 g/dL (ref 12.0–15.0)
Lymphocytes Relative: 41 % (ref 12.0–46.0)
Lymphs Abs: 2.1 10*3/uL (ref 0.7–4.0)
MCHC: 33.3 g/dL (ref 30.0–36.0)
MCV: 93.9 fl (ref 78.0–100.0)
Monocytes Absolute: 0.5 10*3/uL (ref 0.1–1.0)
Monocytes Relative: 9.2 % (ref 3.0–12.0)
Neutro Abs: 2.3 10*3/uL (ref 1.4–7.7)
Neutrophils Relative %: 44.2 % (ref 43.0–77.0)
Platelets: 202 10*3/uL (ref 150.0–400.0)
RBC: 4.3 Mil/uL (ref 3.87–5.11)
RDW: 15.8 % — ABNORMAL HIGH (ref 11.5–15.5)
WBC: 5.2 10*3/uL (ref 4.0–10.5)

## 2021-09-03 LAB — TSH: TSH: 0.62 u[IU]/mL (ref 0.35–5.50)

## 2021-09-03 LAB — CORTISOL: Cortisol, Plasma: 6.1 ug/dL

## 2021-09-03 NOTE — Assessment & Plan Note (Signed)
Confusion, a very low BP after taking Oxycodone and Norco after hip surgery, would get very orthostatic, syncopal - Oct 2022 and Dec 2021  Can take Tramadol in the future

## 2021-09-03 NOTE — Progress Notes (Signed)
Subjective:  Patient ID: Ellen [name], female    DOB: 07/12/1950  Age: 71 y.o. MRN: [id number]  CC: Hypertension   HPI Ellen [name] presents for a c/o a very low BP after taking Oxycodone and Norco after hip surgery, would get very orthostatic, syncopal - Oct 2022 and Dec 2021  C/o occasional lightheadedness at night when standing up    Outpatient Medications Prior to Visit  Medication Sig Dispense Refill   Ascorbic Acid (SUPER C COMPLEX PO) Take 1 tablet by mouth daily.     aspirin 81 MG chewable tablet 1 tablet     benzonatate (TESSALON) 100 MG capsule TAKE 1 TO 2 CAPSULES BY MOUTH THREE TIMES DAILY AS NEEDED 60 capsule 1   cephALEXin (KEFLEX) 500 MG capsule Take 1 capsule (500 mg total) by mouth 4 (four) times daily. 20 capsule 1   Cholecalciferol (VITAMIN D) 125 MCG (5000 UT) CAPS Take 5,000 Units by mouth in the morning and at bedtime.     citalopram (CELEXA) 20 MG tablet Take 0.5 tablets (10 mg total) by mouth daily. 45 tablet 3   COLLAGEN PO Take 1 tablet by mouth daily.     CRANBERRY EXTRACT PO Take 1 capsule by mouth daily.      Cyanocobalamin (B-12) 2500 MCG TABS Take 2,500 mcg by mouth daily.     D-Mannose 500 MG CAPS Take 500 mg by mouth daily.     fexofenadine (ALLEGRA) 180 MG tablet Take 180 mg by mouth daily.     gabapentin (NEURONTIN) 300 MG capsule 1 capsule     levothyroxine (SYNTHROID) 125 MCG tablet Take 1 tablet (125 mcg total) by mouth daily. 90 tablet 3   methocarbamol (ROBAXIN) 500 MG tablet TAKE 1 TABLET BY MOUTH ONCE DAILY AS NEEDED FOR MUSCLE SPASM 90 tablet 0   Multiple Vitamin (MULTIVITAMIN) tablet Take 1 tablet by mouth daily.     NON FORMULARY Take 1 capsule by mouth at bedtime. Tart cherry extract     omeprazole (PRILOSEC OTC) 20 MG tablet 1 tablet     OVER THE COUNTER MEDICATION Apply 1 application topically in the morning and at bedtime. Bonne Dolores. Apply to scalp twice a day for hair growth.     pantoprazole (PROTONIX) 20 MG tablet 1 tablet 90  tablet 3   Probiotic Product (ALIGN) 4 MG CAPS 1 capsule     traZODone (DESYREL) 50 MG tablet TAKE 1 TABLET BY MOUTH AT BEDTIME 90 tablet 0   TURMERIC PO Take 1 capsule by mouth daily.     Valerian Root 100 MG CAPS Take 100 mg by mouth at bedtime.     zinc gluconate 50 MG tablet Take 50 mg by mouth daily.     FLUZONE HIGH-DOSE QUADRIVALENT 0.7 ML SUSY      lidocaine (LIDODERM) 5 % SMARTSIG:0.5-1 Patch(s) Topical Daily PRN     ondansetron (ZOFRAN-ODT) 4 MG disintegrating tablet Take 1 tablet (4 mg total) by mouth every 8 (eight) hours as needed for up to 10 doses for nausea or vomiting. 10 tablet 0   PFIZER COVID-19 VAC BIVALENT injection  (Patient not taking: Reported on 09/03/2021)     valACYclovir (VALTREX) 1000 MG tablet Take 2,000 mg by mouth 2 (two) times daily.     YUVAFEM 10 MCG TABS vaginal tablet Place vaginally.     zolpidem (AMBIEN) 10 MG tablet Take by mouth.     No facility-administered medications prior to visit.    ROS: Review of Systems  Constitutional:  Negative for activity change, appetite change, chills, fatigue and unexpected weight change.  HENT:  Negative for congestion, mouth sores and sinus pressure.   Eyes:  Negative for visual disturbance.  Respiratory:  Negative for cough and chest tightness.   Gastrointestinal:  Negative for abdominal pain and nausea.  Genitourinary:  Negative for difficulty urinating, frequency and vaginal pain.  Musculoskeletal:  Negative for back pain and gait problem.  Skin:  Negative for pallor and rash.  Neurological:  Positive for light-headedness. Negative for dizziness, tremors, syncope, weakness, numbness and headaches.  Psychiatric/Behavioral:  Negative for confusion and sleep disturbance.     Objective:  BP 130/85 (Patient Position: Standing) Comment (Patient Position): mildly lightheaded  Pulse (!) 57   Temp 98.2 F (36.8 C) (Oral)   Ht '5\' 2"'$  (1.575 m)   Wt 146 lb (66.2 kg)   SpO2 97%   BMI 26.70 kg/m   BP Readings  from Last 3 Encounters:  09/03/21 130/85  06/18/21 112/74  06/05/21 116/71    Wt Readings from Last 3 Encounters:  09/03/21 146 lb (66.2 kg)  06/18/21 146 lb (66.2 kg)  06/05/21 140 lb (63.5 kg)    Physical Exam Constitutional:      General: She is not in acute distress.    Appearance: She is well-developed. She is not ill-appearing.  HENT:     Head: Normocephalic.     Right Ear: External ear normal.     Left Ear: External ear normal.     Nose: Nose normal.  Eyes:     General:        Right eye: No discharge.        Left eye: No discharge.     Conjunctiva/sclera: Conjunctivae normal.     Pupils: Pupils are equal, round, and reactive to light.  Neck:     Thyroid: No thyromegaly.     Vascular: No JVD.     Trachea: No tracheal deviation.  Cardiovascular:     Rate and Rhythm: Normal rate and regular rhythm.     Heart sounds: Normal heart sounds.  Pulmonary:     Effort: No respiratory distress.     Breath sounds: No stridor. No wheezing.  Abdominal:     General: Bowel sounds are normal. There is no distension.     Palpations: Abdomen is soft. There is no mass.     Tenderness: There is no abdominal tenderness. There is no guarding or rebound.  Musculoskeletal:        General: No tenderness.     Cervical back: Normal range of motion and neck supple. No rigidity.  Lymphadenopathy:     Cervical: No cervical adenopathy.  Skin:    Findings: No erythema or rash.  Neurological:     Cranial Nerves: No cranial nerve deficit.     Motor: No abnormal muscle tone.     Coordination: Coordination normal.     Deep Tendon Reflexes: Reflexes normal.  Psychiatric:        Behavior: Behavior normal.        Thought Content: Thought content normal.        Judgment: Judgment normal.     Lab Results  Component Value Date   WBC 5.5 06/05/2021   HGB 14.9 06/05/2021   HCT 46.0 06/05/2021   PLT 232 06/05/2021   GLUCOSE 98 06/05/2021   CHOL 226 (H) 02/29/2020   TRIG 100.0 02/29/2020    HDL 77.50 02/29/2020   LDLDIRECT 142.6 07/09/2011   [place]  128 (H) 02/29/2020   ALT 49 (H) 06/05/2021   AST 40 06/05/2021   NA 139 06/05/2021   K 3.3 (L) 06/05/2021   CL 101 06/05/2021   CREATININE 0.76 06/05/2021   BUN 6 (L) 06/05/2021   CO2 27 06/05/2021   TSH 0.49 03/04/2021   INR 0.9 10/16/2020   HGBA1C 5.6 10/12/2006    No results found.  Assessment & Plan:   Problem List Items Addressed This Visit     Hypotension after procedure    Confusion, a very low BP after taking Oxycodone and Norco after hip surgery, would get very orthostatic, syncopal - Oct 2022 and Dec 2021  Can take Tramadol in the future      Relevant Orders   TSH   CBC with Differential/Platelet   Comprehensive metabolic panel   Cortisol   Hypothyroidism - Primary   Relevant Orders   TSH   CBC with Differential/Platelet   Comprehensive metabolic panel   Cortisol      No orders of the defined types were placed in this encounter.     Follow-up: Return in about 6 months (around 03/06/2022) for Wellness Exam.  Walker Kehr, MD

## 2021-09-04 LAB — COMPREHENSIVE METABOLIC PANEL
ALT: 15 U/L (ref 0–35)
AST: 26 U/L (ref 0–37)
Albumin: 4.3 g/dL (ref 3.5–5.2)
Alkaline Phosphatase: 63 U/L (ref 39–117)
BUN: 15 mg/dL (ref 6–23)
CO2: 23 mEq/L (ref 19–32)
Calcium: 9.2 mg/dL (ref 8.4–10.5)
Chloride: 104 mEq/L (ref 96–112)
Creatinine, Ser: 0.79 mg/dL (ref 0.40–1.20)
GFR: 74.45 mL/min (ref >60.00)
Glucose, Bld: 86 mg/dL (ref 70–99)
Potassium: 4.5 mEq/L (ref 3.5–5.1)
Sodium: 140 mEq/L (ref 135–145)
Total Bilirubin: 0.5 mg/dL (ref 0.2–1.2)
Total Protein: 7.2 g/dL (ref 6.0–8.3)

## 2021-09-08 ENCOUNTER — Encounter: Payer: Self-pay | Admitting: Internal Medicine

## 2021-09-10 ENCOUNTER — Ambulatory Visit: Payer: Medicare Other | Admitting: Sports Medicine

## 2021-09-10 ENCOUNTER — Ambulatory Visit (INDEPENDENT_AMBULATORY_CARE_PROVIDER_SITE_OTHER): Payer: Medicare Other

## 2021-09-10 VITALS — BP 118/80 | HR 89 | Ht 62.0 in | Wt 146.0 lb

## 2021-09-10 DIAGNOSIS — M25571 Pain in right ankle and joints of right foot: Secondary | ICD-10-CM | POA: Diagnosis not present

## 2021-09-10 NOTE — Progress Notes (Signed)
Benito Mccreedy D.[place] Phone: [phone]   Assessment and Plan:     1. Right ankle pain, acute chronicity -Acute, uncomplicated, initial sports medicine visit - Lateral right ankle pain after patient fell on 09/08/2021 - X-ray obtained in clinic.  My interpretation: No acute fracture or dislocation.  Intact hardware from prior ankle surgery without displacement - Likely lateral ankle sprain based on HPI, physical exam, unremarkable x-ray - Restart HEP for ankle - May use Tylenol/NSAIDs as needed for pain control - DG Ankle Complete Right; Future     Pertinent previous records reviewed include none   Follow Up: As needed   Subjective:   I, Moenique Parris, am serving as a Education administrator for Doctor [employer]  Chief Complaint: right ankle pain   HPI:   09/10/21 Patient is a 71 year old female complaining of right ankle pain patient states that she is biker and very active , Sunday she rode 20 miles was standing for a long time, she passed out due to heat , crumbled and twisted her ankle on the way down, wants to make sure she didn't break anything is supposed to ride this weekend   Relevant Historical Information: Hypertension, hypothyroidism  Additional pertinent review of systems negative.   Current Outpatient Medications:    Ascorbic Acid (SUPER C COMPLEX PO), Take 1 tablet by mouth daily., Disp: , Rfl:    aspirin 81 MG chewable tablet, 1 tablet, Disp: , Rfl:    benzonatate (TESSALON) 100 MG capsule, TAKE 1 TO 2 CAPSULES BY MOUTH THREE TIMES DAILY AS NEEDED, Disp: 60 capsule, Rfl: 1   cephALEXin (KEFLEX) 500 MG capsule, Take 1 capsule (500 mg total) by mouth 4 (four) times daily., Disp: 20 capsule, Rfl: 1   Cholecalciferol (VITAMIN D) 125 MCG (5000 UT) CAPS, Take 5,000 Units by mouth in the morning and at bedtime., Disp: , Rfl:    citalopram (CELEXA) 20 MG tablet, Take 0.5 tablets (10 mg total) by  mouth daily., Disp: 45 tablet, Rfl: 3   COLLAGEN PO, Take 1 tablet by mouth daily., Disp: , Rfl:    CRANBERRY EXTRACT PO, Take 1 capsule by mouth daily. , Disp: , Rfl:    Cyanocobalamin (B-12) 2500 MCG TABS, Take 2,500 mcg by mouth daily., Disp: , Rfl:    D-Mannose 500 MG CAPS, Take 500 mg by mouth daily., Disp: , Rfl:    fexofenadine (ALLEGRA) 180 MG tablet, Take 180 mg by mouth daily., Disp: , Rfl:    gabapentin (NEURONTIN) 300 MG capsule, 1 capsule, Disp: , Rfl:    levothyroxine (SYNTHROID) 125 MCG tablet, Take 1 tablet (125 mcg total) by mouth daily., Disp: 90 tablet, Rfl: 3   methocarbamol (ROBAXIN) 500 MG tablet, TAKE 1 TABLET BY MOUTH ONCE DAILY AS NEEDED FOR MUSCLE SPASM, Disp: 90 tablet, Rfl: 0   Multiple Vitamin (MULTIVITAMIN) tablet, Take 1 tablet by mouth daily., Disp: , Rfl:    NON FORMULARY, Take 1 capsule by mouth at bedtime. Tart cherry extract, Disp: , Rfl:    omeprazole (PRILOSEC OTC) 20 MG tablet, 1 tablet, Disp: , Rfl:    OVER THE COUNTER MEDICATION, Apply 1 application topically in the morning and at bedtime. Bonne Dolores. Apply to scalp twice a day for hair growth., Disp: , Rfl:    pantoprazole (PROTONIX) 20 MG tablet, 1 tablet, Disp: 90 tablet, Rfl: 3   Probiotic Product (ALIGN) 4 MG CAPS, 1 capsule, Disp: , Rfl:  traZODone (DESYREL) 50 MG tablet, TAKE 1 TABLET BY MOUTH AT BEDTIME, Disp: 90 tablet, Rfl: 0   TURMERIC PO, Take 1 capsule by mouth daily., Disp: , Rfl:    Valerian Root 100 MG CAPS, Take 100 mg by mouth at bedtime., Disp: , Rfl:    zinc gluconate 50 MG tablet, Take 50 mg by mouth daily., Disp: , Rfl:    Objective:     Vitals:   09/10/21 1312  BP: 118/80  Pulse: 89  SpO2: 97%  Weight: 146 lb (66.2 kg)  Height: '5\' 2"'$  (1.575 m)      Body mass index is 26.7 kg/m.    Physical Exam:    Gen: Appears well, nad, nontoxic and pleasant Psych: Alert and oriented, appropriate mood and affect Neuro: sensation intact, strength is 5/5 with df/pf/inv/ev, muscle  tone wnl Skin: no susupicious lesions or rashes  Right ankle: no deformity, no swelling or effusion TTP mildly CFL NTTP over fibular head, lat mal, medial mal, achilles, navicular, base of 5th, ATFL,  , deltoid, calcaneous or midfoot ROM DF 30, PF 45, inv/ev intact Negative ant drawer, talar tilt, rotation test, squeeze test. Neg thompson No pain with resisted inversion or eversion    Electronically signed by:  Benito Mccreedy D.[facility] 1:32 PM 09/10/21

## 2021-09-29 ENCOUNTER — Other Ambulatory Visit: Payer: Self-pay | Admitting: Internal Medicine

## 2021-10-07 ENCOUNTER — Encounter: Payer: Self-pay | Admitting: Internal Medicine

## 2021-10-15 ENCOUNTER — Other Ambulatory Visit: Payer: Self-pay | Admitting: Internal Medicine

## 2021-10-16 DIAGNOSIS — H43813 Vitreous degeneration, bilateral: Secondary | ICD-10-CM | POA: Diagnosis not present

## 2021-10-16 DIAGNOSIS — H04123 Dry eye syndrome of bilateral lacrimal glands: Secondary | ICD-10-CM | POA: Diagnosis not present

## 2021-10-16 DIAGNOSIS — Z961 Presence of intraocular lens: Secondary | ICD-10-CM | POA: Diagnosis not present

## 2021-11-21 ENCOUNTER — Other Ambulatory Visit (HOSPITAL_BASED_OUTPATIENT_CLINIC_OR_DEPARTMENT_OTHER): Payer: Self-pay

## 2021-11-21 MED ORDER — COMIRNATY 30 MCG/0.3ML IM SUSY
PREFILLED_SYRINGE | INTRAMUSCULAR | 0 refills | Status: DC
  Filled 2021-11-21: qty 0.3, 1d supply, fill #0

## 2022-02-06 ENCOUNTER — Encounter: Payer: Self-pay | Admitting: Family Medicine

## 2022-02-07 DIAGNOSIS — R109 Unspecified abdominal pain: Secondary | ICD-10-CM | POA: Diagnosis not present

## 2022-02-07 DIAGNOSIS — K219 Gastro-esophageal reflux disease without esophagitis: Secondary | ICD-10-CM | POA: Diagnosis not present

## 2022-02-07 DIAGNOSIS — K31A0 Gastric intestinal metaplasia, unspecified: Secondary | ICD-10-CM | POA: Diagnosis not present

## 2022-02-07 DIAGNOSIS — R197 Diarrhea, unspecified: Secondary | ICD-10-CM | POA: Diagnosis not present

## 2022-02-07 DIAGNOSIS — Z1231 Encounter for screening mammogram for malignant neoplasm of breast: Secondary | ICD-10-CM | POA: Diagnosis not present

## 2022-02-07 LAB — HM MAMMOGRAPHY

## 2022-02-10 ENCOUNTER — Encounter: Payer: Self-pay | Admitting: *Deleted

## 2022-02-13 NOTE — Progress Notes (Signed)
[place] Phone: [phone] Subjective:   [name], am serving as a scribe for Dr. Hulan Saas.  I'm seeing this patient by the request  of:  Plotnikov, Evie Lacks, MD  CC: Hand pain, knee pain  [id number]  Last seen June 2023 with shoulder pain  Updated 02/17/2022 Kenneshia L Massaquoi is a 72 y.o. female coming in with complaint of R hand ring finger pain, PIP. Swelling and catching for past 6 months.   R knee pain distal to patella. No pain with cycling. Painful to go upstairs.        Past Medical History:  Diagnosis Date   ALLERGIC RHINITIS    ANXIETY    Arthritis    GERD    Hypothyroidism    INSOMNIA-SLEEP DISORDER-UNSPEC    Osteopenia 01/2016   T score -1.2 FRAX 15%/0.9%   Shingles    Thyroid disease    Past Surgical History:  Procedure Laterality Date   ANKLE FRACTURE SURGERY  2012   R   APPENDECTOMY     BREAST SURGERY     REDUCTION MAMMOPLASTY   CESAREAN SECTION     twice   COSMETIC SURGERY     FACELIFT     s/p   HIP SURGERY Right 12/08/2019   Patient reported   [place] HIP ARTHROPLASTY Left 10/16/2020   Procedure: LEFT TOTAL HIP ARTHROPLASTY ANTERIOR APPROACH;  Surgeon: Melrose Nakayama, MD;  Location: [facility];  Service: Orthopedics;  Laterality: Left;   Upper nl TSH w/sx     VAGINAL HYSTERECTOMY  1991   TVH, irregular bleeding   Social History   Socioeconomic History   Marital status: Married    Spouse name: Not on file   Number of children: 2   Years of education: Not on file   Highest education level: Not on file  Occupational History   Occupation: Retired    Fish farm manager: SELF EMPLOYED  Tobacco Use   Smoking status: Never   Smokeless tobacco: Never  Vaping Use   Vaping Use: Never used  Substance and Sexual Activity   Alcohol use: Yes    Alcohol/week: 10.0 standard drinks of alcohol    Types: 10 Glasses of wine per week    Comment: wine  daily   Drug use: No   Sexual activity: Yes    Birth control/protection: Post-menopausal, Surgical    Comment: HYST-1st intercourse 72 yo-Fewer than 5 partners  Other Topics Concern   Not on file  Social History Narrative   Regular exercise   2 biol children-1 disabled with schizophrenia & 1 stepchild   Social Determinants of Health   Financial Resource Strain: Not on file  Food Insecurity: Not on file  Transportation Needs: Not on file  Physical Activity: Not on file  Stress: Not on file  Social Connections: Not on file   Allergies  Allergen Reactions   Belviq [Lorcaserin Hcl] Diarrhea   Demerol [Meperidine] Other (See Comments)    Palpitations. Many years ago.   Elavil [Amitriptyline Hcl] Other (See Comments)    constipation   Hydrocodone     Confusion, a very low BP after taking Oxycodone and Norco after hip surgery, would get very orthostatic, syncopal - Oct 2022 and Dec 2021  Can take Tramadol   Oxycodone Other (See Comments)    Confusion, a very low BP after taking Oxycodone and Norco after hip surgery, would get very orthostatic, syncopal -  Oct 2022 and Dec 2021  Can take Tramadol   Peanut Oil Itching   Shellfish-Derived Products Itching   Latex Hives, Itching and Rash    Redness    Medical Adhesive Remover Hives, Itching and Rash   Family History  Problem Relation Age of Onset   Heart disease Father        CAD/CABG in his 45's   Hypertension Mother    Heart failure Mother     Current Outpatient Medications (Endocrine & Metabolic):    levothyroxine (SYNTHROID) 125 MCG tablet, Take 1 tablet (125 mcg total) by mouth daily.   Current Outpatient Medications (Respiratory):    benzonatate (TESSALON) 100 MG capsule, TAKE 1 TO 2 CAPSULES BY MOUTH THREE TIMES DAILY AS NEEDED   fexofenadine (ALLEGRA) 180 MG tablet, Take 180 mg by mouth daily.  Current Outpatient Medications (Analgesics):    aspirin 81 MG chewable tablet, 1 tablet  Current Outpatient  Medications (Hematological):    Cyanocobalamin (B-12) 2500 MCG TABS, Take 2,500 mcg by mouth daily.  Current Outpatient Medications (Other):    cephALEXin (KEFLEX) 500 MG capsule, Take 1 capsule (500 mg total) by mouth 4 (four) times daily.   citalopram (CELEXA) 20 MG tablet, Take 1 tablet by mouth once daily   COVID-19 mRNA vaccine 2023-2024 (COMIRNATY) syringe, Inject into the muscle.   CRANBERRY EXTRACT PO, Take 1 capsule by mouth daily.    D-Mannose 500 MG CAPS, Take 500 mg by mouth daily.   gabapentin (NEURONTIN) 300 MG capsule, 1 capsule   methocarbamol (ROBAXIN) 500 MG tablet, TAKE 1 TABLET BY MOUTH ONCE DAILY AS NEEDED FOR MUSCLE SPASM   Multiple Vitamin (MULTIVITAMIN) tablet, Take 1 tablet by mouth daily.   NON FORMULARY, Take 1 capsule by mouth at bedtime. Tart cherry extract   OVER THE COUNTER MEDICATION, Apply 1 application topically in the morning and at bedtime. Bonne Dolores. Apply to scalp twice a day for hair growth.   pantoprazole (PROTONIX) 20 MG tablet, 1 tablet   Probiotic Product (ALIGN) 4 MG CAPS, 1 capsule   traZODone (DESYREL) 50 MG tablet, TAKE 1 TABLET BY MOUTH AT BEDTIME   TURMERIC PO, Take 1 capsule by mouth daily.   Valerian Root 100 MG CAPS, Take 100 mg by mouth at bedtime.   Ascorbic Acid (SUPER C COMPLEX PO), Take 1 tablet by mouth daily.   Cholecalciferol (VITAMIN D) 125 MCG (5000 UT) CAPS, Take 5,000 Units by mouth in the morning and at bedtime.   COLLAGEN PO, Take 1 tablet by mouth daily.   omeprazole (PRILOSEC OTC) 20 MG tablet, 1 tablet   zinc gluconate 50 MG tablet, Take 50 mg by mouth daily.   Reviewed prior external information including notes and imaging from  primary care provider As well as notes that were available from care everywhere and other healthcare systems.  Past medical history, social, surgical and family history all reviewed in electronic medical record.  No pertanent information unless stated regarding to the chief complaint.    Review of Systems:  No headache, visual changes, nausea, vomiting, diarrhea, constipation, dizziness, abdominal pain, skin rash, fevers, chills, night sweats, weight loss, swollen lymph nodes, body aches, joint swelling, chest pain, shortness of breath, mood changes. POSITIVE muscle aches  Objective  Blood pressure 128/82, pulse 64, height 5' 2"$  (1.575 m), weight 139 lb (63 kg), SpO2 99 %.   General: No apparent distress alert and oriented x3 mood and affect normal, dressed appropriately.  HEENT: Pupils equal, extraocular movements intact  Respiratory: Patient's speak in full sentences and does not appear short of breath  Cardiovascular: No lower extremity edema, non tender, no erythema  Right hand exam shows that the ring finger does have a trigger nodule at the A1 pulley noted.  Does have triggering noted today.  Tender to palpation  Right knee exam does have a trace effusion noted.  Patient does have mild crepitus noted as well.  Positive McMurray's noted.  After informed written and verbal consent, patient was seated on exam table. Right knee was prepped with alcohol swab and utilizing anterolateral approach, patient's right knee space was injected with 4:1  marcaine 0.5%: Kenalog 4m/dL. Patient tolerated the procedure well without immediate complications.  Procedure: Real-time Ultrasound Guided Injection of right ring flexor tendon sheath Device: GE Logiq Q7 Ultrasound guided injection is preferred based studies that show increased duration, increased effect, greater accuracy, decreased procedural pain, increased response rate, and decreased cost with ultrasound guided versus blind injection.  Verbal informed consent obtained.  Time-out conducted.  Noted no overlying erythema, induration, or other signs of local infection.  Skin prepped in a sterile fashion.  Local anesthesia: Topical Ethyl chloride.  With sterile technique and under real time ultrasound guidance: With a 25-gauge  half inch needle injected with 0.5 cc of 0.5% Marcaine and 0.5 cc of Kenalog and 40 mg/mL Completed without difficulty  Pain immediately resolved suggesting accurate placement of the medication.  Advised to call if fevers/chills, erythema, induration, drainage, or persistent bleeding.  Impression: Technically successful ultrasound guided injection.    Impression and Recommendations:     The above documentation has been reviewed and is accurate and complete ZLyndal Pulley DO

## 2022-02-14 ENCOUNTER — Telehealth: Payer: Self-pay

## 2022-02-14 NOTE — Telephone Encounter (Signed)
Left message for patient to call back to schedule Medicare Annual Wellness Visit   Last AWV  01/01/17  Please schedule at anytime with [facility] if patient calls the office back.    30 Minutes appointment   Any questions, please call me at [phone]

## 2022-02-17 ENCOUNTER — Ambulatory Visit: Payer: Medicare Other | Admitting: Family Medicine

## 2022-02-17 ENCOUNTER — Ambulatory Visit (INDEPENDENT_AMBULATORY_CARE_PROVIDER_SITE_OTHER): Payer: Medicare Other

## 2022-02-17 ENCOUNTER — Ambulatory Visit: Payer: Self-pay

## 2022-02-17 VITALS — BP 128/82 | HR 64 | Ht 62.0 in | Wt 139.0 lb

## 2022-02-17 DIAGNOSIS — G8929 Other chronic pain: Secondary | ICD-10-CM

## 2022-02-17 DIAGNOSIS — M65341 Trigger finger, right ring finger: Secondary | ICD-10-CM

## 2022-02-17 DIAGNOSIS — M23300 Other meniscus derangements, unspecified lateral meniscus, right knee: Secondary | ICD-10-CM | POA: Diagnosis not present

## 2022-02-17 DIAGNOSIS — M79641 Pain in right hand: Secondary | ICD-10-CM

## 2022-02-17 DIAGNOSIS — M25571 Pain in right ankle and joints of right foot: Secondary | ICD-10-CM

## 2022-02-17 NOTE — Assessment & Plan Note (Signed)
Patient given injection today, discussed bracing at night, discussed that this can come back again in 9 to 18 months.

## 2022-02-17 NOTE — Patient Instructions (Signed)
Injected finger and knee today Exercises 3x a week Ice for 20 min after activity See me again in 6-8 weeks

## 2022-02-17 NOTE — Assessment & Plan Note (Signed)
Tear noted on the right knee.  Discussed icing regimen and home exercises, which activities to do and which ones to avoid.  Increase activity as tolerated.  Follow-up again in 6 to 8 weeks worsening pain advanced imaging may be warranted.  Patient will be the primary caregiver for her husband who recently did have hernia surgery.  Social determinants of health

## 2022-02-19 ENCOUNTER — Encounter: Payer: Self-pay | Admitting: Family Medicine

## 2022-02-20 ENCOUNTER — Ambulatory Visit (INDEPENDENT_AMBULATORY_CARE_PROVIDER_SITE_OTHER): Payer: Medicare Other | Admitting: Obstetrics & Gynecology

## 2022-02-20 ENCOUNTER — Encounter: Payer: Self-pay | Admitting: Obstetrics & Gynecology

## 2022-02-20 VITALS — BP 110/74 | HR 69 | Ht 61.0 in | Wt 138.0 lb

## 2022-02-20 DIAGNOSIS — Z780 Asymptomatic menopausal state: Secondary | ICD-10-CM

## 2022-02-20 DIAGNOSIS — M8589 Other specified disorders of bone density and structure, multiple sites: Secondary | ICD-10-CM

## 2022-02-20 DIAGNOSIS — Z9289 Personal history of other medical treatment: Secondary | ICD-10-CM

## 2022-02-20 DIAGNOSIS — Z9189 Other specified personal risk factors, not elsewhere classified: Secondary | ICD-10-CM | POA: Diagnosis not present

## 2022-02-20 DIAGNOSIS — B009 Herpesviral infection, unspecified: Secondary | ICD-10-CM

## 2022-02-20 DIAGNOSIS — Z01419 Encounter for gynecological examination (general) (routine) without abnormal findings: Secondary | ICD-10-CM

## 2022-02-20 NOTE — Progress Notes (Signed)
Ellen [name] 02-03-1950 [id number]   History:    72 y.o. G3P2A1L2 Married.   RP:  Established patient presenting for Annual Gyn exam  HPI: Postmenopause, well on no HRT.  Prior hysterectomy (TVH in 1991 for AUB).  Pap smear Neg 12/2016. Not repeated today. No prior history of abnormal Pap smears.  She is okay with not having Pap smear surveillance based on age criteria and prior hysterectomy. Breasts s/p reduction normal bilaterally.  Mammogram at [place], [place] 02/2022. [place] 08/2020. Osteopenia.   DEXA 03/2020 T score -2.4.  Repeat BD here now.  She is active with biking and healthy. BMI 26.07. Health labs with Fam MD.    Past medical history,surgical history, family history and social history were all reviewed and documented in the EPIC chart.  Gynecologic History No LMP recorded. Patient has had a hysterectomy.  Obstetric History OB History  Gravida Para Term Preterm AB Living  3 2     1 2  $ SAB IAB Ectopic Multiple Live Births  1            # Outcome Date GA Lbr Len/2nd Weight Sex Delivery Anes PTL Lv  3 SAB           2 Para           1 Para              ROS: A ROS was performed and pertinent positives and negatives are included in the history. GENERAL: No fevers or chills. HEENT: No change in vision, no earache, sore throat or sinus congestion. NECK: No pain or stiffness. CARDIOVASCULAR: No chest pain or pressure. No palpitations. PULMONARY: No shortness of breath, cough or wheeze. GASTROINTESTINAL: No abdominal pain, nausea, vomiting or diarrhea, melena or bright red blood per rectum. GENITOURINARY: No urinary frequency, urgency, hesitancy or dysuria. MUSCULOSKELETAL: No joint or muscle pain, no back pain, no recent trauma. DERMATOLOGIC: No rash, no itching, no lesions. ENDOCRINE: No polyuria, polydipsia, no heat or cold intolerance. No recent change in weight. HEMATOLOGICAL: No anemia or easy bruising or bleeding. NEUROLOGIC: No headache, seizures, numbness, tingling or weakness.  PSYCHIATRIC: No depression, no loss of interest in normal activity or change in sleep pattern.     Exam:   BP 110/74   Pulse 69   Ht 5' 1"$  (1.549 m)   Wt 138 lb (62.6 kg)   SpO2 99%   BMI 26.07 kg/m   Body mass index is 26.07 kg/m.  General appearance : Well developed well nourished female. No acute distress HEENT: Eyes: no retinal hemorrhage or exudates,  Neck supple, trachea midline, no carotid bruits, no thyroidmegaly Lungs: Clear to auscultation, no rhonchi or wheezes, or rib retractions  Heart: Regular rate and rhythm, no murmurs or gallops Breast:Examined in sitting and supine position were symmetrical in appearance, no palpable masses or tenderness,  no skin retraction, no nipple inversion, no nipple discharge, no skin discoloration, no axillary or supraclavicular lymphadenopathy Abdomen: no palpable masses or tenderness, no rebound or guarding Extremities: no edema or skin discoloration or tenderness  Pelvic: Vulva: Normal             Vagina: No gross lesions or discharge  Cervix/Uterus absent  Adnexa  Without masses or tenderness  Anus: Normal   Assessment/Plan:  72 y.o. female for annual exam   1. Well female exam with routine gynecological exam Postmenopause, well on no HRT.  Prior hysterectomy (TVH in 1991 for AUB).  Pap smear Neg 12/2016. Not  repeated today. No prior history of abnormal Pap smears.  She is okay with not having Pap smear surveillance based on age criteria and prior hysterectomy. Breasts s/p reduction normal bilaterally.  Mammogram at [place], [place] 02/2022. Col[place] 08/2020. Osteopenia on DEXA 03/2020 T score -2.4.  Repeat BD here now.  She is active with biking and healthy. BMI 26.07. Health labs with Fam MD.    2. Postmenopause Postmenopause, well on no HRT.  Prior hysterectomy (TVH in 1991 for AUB).   3. Osteopenia of multiple sites Osteopenia on DEXA 03/2020 T score -2.4.  Repeat BD here now.  She is active with biking and healthy. BMI 26.07. - DG Bone  Density; Future  4. Personal history of other medical treatment  Other orders - colestipol (COLESTID) 1 g tablet; 2 tablets Orally at night, 2 hours apart from other medications for 30 days   Princess Bruins MD, 3:19 PM

## 2022-02-28 ENCOUNTER — Other Ambulatory Visit: Payer: Self-pay | Admitting: Internal Medicine

## 2022-03-05 ENCOUNTER — Ambulatory Visit (INDEPENDENT_AMBULATORY_CARE_PROVIDER_SITE_OTHER): Payer: Medicare Other | Admitting: Internal Medicine

## 2022-03-05 ENCOUNTER — Encounter: Payer: Self-pay | Admitting: Internal Medicine

## 2022-03-05 ENCOUNTER — Telehealth: Payer: Self-pay | Admitting: *Deleted

## 2022-03-05 VITALS — BP 120/72 | HR 65 | Temp 98.5°F | Ht 61.0 in | Wt 138.0 lb

## 2022-03-05 DIAGNOSIS — H1032 Unspecified acute conjunctivitis, left eye: Secondary | ICD-10-CM

## 2022-03-05 DIAGNOSIS — E034 Atrophy of thyroid (acquired): Secondary | ICD-10-CM

## 2022-03-05 DIAGNOSIS — Z0000 Encounter for general adult medical examination without abnormal findings: Secondary | ICD-10-CM

## 2022-03-05 DIAGNOSIS — K2940 Chronic atrophic gastritis without bleeding: Secondary | ICD-10-CM

## 2022-03-05 LAB — CBC WITH DIFFERENTIAL/PLATELET
Basophils Absolute: 0.1 10*3/uL (ref 0.0–0.1)
Basophils Relative: 1.4 % (ref 0.0–3.0)
Eosinophils Absolute: 0.3 10*3/uL (ref 0.0–0.7)
Eosinophils Relative: 7.2 % — ABNORMAL HIGH (ref 0.0–5.0)
HCT: 43.7 % (ref 36.0–46.0)
Hemoglobin: 14.6 g/dL (ref 12.0–15.0)
Lymphocytes Relative: 40.2 % (ref 12.0–46.0)
Lymphs Abs: 1.9 10*3/uL (ref 0.7–4.0)
MCHC: 33.4 g/dL (ref 30.0–36.0)
MCV: 92.6 fl (ref 78.0–100.0)
Monocytes Absolute: 0.4 10*3/uL (ref 0.1–1.0)
Monocytes Relative: 9.1 % (ref 3.0–12.0)
Neutro Abs: 2 10*3/uL (ref 1.4–7.7)
Neutrophils Relative %: 42.1 % — ABNORMAL LOW (ref 43.0–77.0)
Platelets: 230 10*3/uL (ref 150.0–400.0)
RBC: 4.72 Mil/uL (ref 3.87–5.11)
RDW: 12.8 % (ref 11.5–15.5)
WBC: 4.7 10*3/uL (ref 4.0–10.5)

## 2022-03-05 LAB — COMPREHENSIVE METABOLIC PANEL
ALT: 13 U/L (ref 0–35)
AST: 20 U/L (ref 0–37)
Albumin: 4 g/dL (ref 3.5–5.2)
Alkaline Phosphatase: 57 U/L (ref 39–117)
BUN: 13 mg/dL (ref 6–23)
CO2: 28 mEq/L (ref 19–32)
Calcium: 9.4 mg/dL (ref 8.4–10.5)
Chloride: 106 mEq/L (ref 96–112)
Creatinine, Ser: 0.73 mg/dL (ref 0.40–1.20)
GFR: 81.56 mL/min (ref >60.00)
Glucose, Bld: 82 mg/dL (ref 70–99)
Potassium: 4.4 mEq/L (ref 3.5–5.1)
Sodium: 141 mEq/L (ref 135–145)
Total Bilirubin: 0.4 mg/dL (ref 0.2–1.2)
Total Protein: 6.8 g/dL (ref 6.0–8.3)

## 2022-03-05 LAB — LIPID PANEL
Cholesterol: 193 mg/dL (ref 0–200)
HDL: 55.4 mg/dL (ref >39.00)
LDL Cholesterol: 107 mg/dL — ABNORMAL HIGH (ref 0–99)
NonHDL: 137.23
Total CHOL/HDL Ratio: 3
Triglycerides: 153 mg/dL — ABNORMAL HIGH (ref 0.0–149.0)
VLDL: 30.6 mg/dL (ref 0.0–40.0)

## 2022-03-05 LAB — TSH: TSH: 0.03 u[IU]/mL — ABNORMAL LOW (ref 0.35–5.50)

## 2022-03-05 MED ORDER — POLYMYXIN B-TRIMETHOPRIM 10000-0.1 UNIT/ML-% OP SOLN: 1.0000 [drp] | Freq: Four times a day (QID) | OPHTHALMIC | 0 refills | Status: DC

## 2022-03-05 NOTE — Assessment & Plan Note (Signed)
We discussed age appropriate health related issues, including available/recomended screening tests and vaccinations. Labs were ordered to be later reviewed . All questions were answered. We discussed one or more of the following - seat belt use, use of sunscreen/sun exposure exercise, fall risk reduction, second hand smoke exposure, firearm use and storage, seat belt use, a need for adhering to healthy diet and exercise. Labs were ordered.  All questions were answered.  Colon 2022 [place], due 2025 Dr Heather Roberts EGD due in 2026

## 2022-03-05 NOTE — Telephone Encounter (Signed)
[  3:46 PM] Clodfelter, Tammy hello, i have a pt Ellen [name] [id number]. she did not leave enough urine to complete the urine test. im not sure if the doctor wants to put another order in and you all call her back to recollect   Resent Tammy a msg will enter order for UA. She can call pt to have done.Marland Kitchenl,mb

## 2022-03-05 NOTE — Progress Notes (Signed)
Subjective:  Patient ID: Ellen [name], female    DOB: 1950-06-27  Age: 72 y.o. MRN: [id number]  CC: Annual Exam (May have conjunctivitis)   HPI Ellen [name] presents for a well exam C/o L pink eye - 1 day  Outpatient Medications Prior to Visit  Medication Sig Dispense Refill   benzonatate (TESSALON) 100 MG capsule TAKE 1 TO 2 CAPSULES BY MOUTH THREE TIMES DAILY AS NEEDED 60 capsule 1   citalopram (CELEXA) 20 MG tablet Take 1 tablet by mouth once daily 90 tablet 3   colestipol (COLESTID) 1 g tablet 2 tablets Orally at night, 2 hours apart from other medications for 30 days     CRANBERRY EXTRACT PO Take 1 capsule by mouth daily.      Cyanocobalamin (B-12) 2500 MCG TABS Take 2,500 mcg by mouth daily.     D-Mannose 500 MG CAPS Take 500 mg by mouth daily.     fexofenadine (ALLEGRA) 180 MG tablet Take 180 mg by mouth daily.     gabapentin (NEURONTIN) 100 MG capsule Take 1 capsule by mouth twice daily 180 capsule 0   levothyroxine (SYNTHROID) 125 MCG tablet Take 1 tablet (125 mcg total) by mouth daily. 90 tablet 3   methocarbamol (ROBAXIN) 500 MG tablet TAKE 1 TABLET BY MOUTH ONCE DAILY AS NEEDED FOR MUSCLE SPASM 90 tablet 0   Multiple Vitamin (MULTIVITAMIN) tablet Take 1 tablet by mouth daily.     NON FORMULARY Take 1 capsule by mouth at bedtime. Tart cherry extract     OVER THE COUNTER MEDICATION Apply 1 application topically in the morning and at bedtime. Bonne Dolores. Apply to scalp twice a day for hair growth.     pantoprazole (PROTONIX) 20 MG tablet 1 tablet 90 tablet 3   Probiotic Product (ALIGN) 4 MG CAPS 1 capsule     traZODone (DESYREL) 50 MG tablet TAKE 1 TABLET BY MOUTH AT BEDTIME 90 tablet 1   TURMERIC PO Take 1 capsule by mouth daily.     Valerian Root 100 MG CAPS Take 100 mg by mouth at bedtime.     gabapentin (NEURONTIN) 300 MG capsule TAKE 1 CAPSULE BY MOUTH THREE TIMES DAILY 90 capsule 0   No facility-administered medications prior to visit.    ROS: Review of Systems   Constitutional:  Negative for activity change, appetite change, chills, fatigue and unexpected weight change.  HENT:  Negative for congestion, mouth sores and sinus pressure.   Eyes:  Positive for pain and redness. Negative for visual disturbance.  Respiratory:  Negative for cough and chest tightness.   Gastrointestinal:  Negative for abdominal pain and nausea.  Genitourinary:  Negative for difficulty urinating, frequency and vaginal pain.  Musculoskeletal:  Negative for back pain and gait problem.  Skin:  Negative for pallor and rash.  Neurological:  Negative for dizziness, tremors, weakness, numbness and headaches.  Psychiatric/Behavioral:  Negative for confusion and sleep disturbance.     Objective:  BP 120/72 (BP Location: Left Arm, Patient Position: Sitting, Cuff Size: Normal)   Pulse 65   Temp 98.5 F (36.9 C) (Oral)   Ht '5\' 1"'$  (1.549 m)   Wt 138 lb (62.6 kg)   SpO2 100%   BMI 26.07 kg/m   BP Readings from Last 3 Encounters:  03/05/22 120/72  02/20/22 110/74  02/17/22 128/82    Wt Readings from Last 3 Encounters:  03/05/22 138 lb (62.6 kg)  02/20/22 138 lb (62.6 kg)  02/17/22 139 lb (63 kg)  Physical Exam Constitutional:      General: She is not in acute distress.    Appearance: Normal appearance. She is well-developed.  HENT:     Head: Normocephalic.     Right Ear: External ear normal.     Left Ear: External ear normal.     Nose: Nose normal.  Eyes:     General:        Right eye: No discharge.        Left eye: No discharge.     Conjunctiva/sclera: Conjunctivae normal.     Pupils: Pupils are equal, round, and reactive to light.  Neck:     Thyroid: No thyromegaly.     Vascular: No JVD.     Trachea: No tracheal deviation.  Cardiovascular:     Rate and Rhythm: Normal rate and regular rhythm.     Heart sounds: Normal heart sounds.  Pulmonary:     Effort: No respiratory distress.     Breath sounds: No stridor. No wheezing.  Abdominal:     General:  Bowel sounds are normal. There is no distension.     Palpations: Abdomen is soft. There is no mass.     Tenderness: There is no abdominal tenderness. There is no guarding or rebound.  Musculoskeletal:        General: No tenderness.     Cervical back: Normal range of motion and neck supple. No rigidity.  Lymphadenopathy:     Cervical: No cervical adenopathy.  Skin:    Findings: No erythema or rash.  Neurological:     Mental Status: She is oriented to person, place, and time.     Cranial Nerves: No cranial nerve deficit.     Motor: No abnormal muscle tone.     Coordination: Coordination normal.     Deep Tendon Reflexes: Reflexes normal.  Psychiatric:        Behavior: Behavior normal.        Thought Content: Thought content normal.        Judgment: Judgment normal.     Lab Results  Component Value Date   WBC 5.2 09/03/2021   HGB 13.4 09/03/2021   HCT 40.4 09/03/2021   PLT 202.0 09/03/2021   GLUCOSE 86 09/03/2021   CHOL 226 (H) 02/29/2020   TRIG 100.0 02/29/2020   HDL 77.50 02/29/2020   LDLDIRECT 142.6 07/09/2011   LDLCALC 128 (H) 02/29/2020   ALT 15 09/03/2021   AST 26 09/03/2021   NA 140 09/03/2021   K 4.5 09/03/2021   CL 104 09/03/2021   CREATININE 0.79 09/03/2021   BUN 15 09/03/2021   CO2 23 09/03/2021   TSH 0.62 09/03/2021   INR 0.9 10/16/2020   HGBA1C 5.6 10/12/2006    No results found.  Assessment & Plan:   Problem List Items Addressed This Visit       Digestive   Chronic atrophic gastritis   Relevant Orders   Urinalysis   CBC with Differential/Platelet     Endocrine   Hypothyroidism   Relevant Orders   TSH   Urinalysis   CBC with Differential/Platelet   Lipid panel   Comprehensive metabolic panel     Other   Well adult exam - Primary    We discussed age appropriate health related issues, including available/recomended screening tests and vaccinations. Labs were ordered to be later reviewed . All questions were answered. We discussed one or  more of the following - seat belt use, use of sunscreen/sun exposure exercise, fall risk  reduction, second hand smoke exposure, firearm use and storage, seat belt use, a need for adhering to healthy diet and exercise. Labs were ordered.  All questions were answered.  Colon 2022 [facility], due 2025 Dr Heather Roberts EGD due in 2026      Relevant Orders   TSH   Urinalysis   CBC with Differential/Platelet   Lipid panel   Comprehensive metabolic panel   Conjunctivitis    L eye Polytrim eye gtt      Relevant Orders   CBC with Differential/Platelet      Meds ordered this encounter  Medications   trimethoprim-polymyxin b (POLYTRIM) ophthalmic solution    Sig: Place 1 drop into the left eye every 6 (six) hours.    Dispense:  10 mL    Refill:  0      Follow-up: Return in about 6 months (around 09/03/2022) for a follow-up visit.  Walker Kehr, MD

## 2022-03-05 NOTE — Assessment & Plan Note (Signed)
L eye Polytrim eye gtt

## 2022-03-05 NOTE — Telephone Encounter (Signed)
[  4:13 PM] Clodfelter, Tammy i tried to call the pt back her voicemail came on and it states that she would rather have a text or email, is there a way you can mychart the pt and let her know she needs to recollect? [4:14 PM] Clodfelter, Tammy the lab doesnt send mychart messages or emails. Thank You  Sent pt msg via [employer]

## 2022-03-06 ENCOUNTER — Other Ambulatory Visit (INDEPENDENT_AMBULATORY_CARE_PROVIDER_SITE_OTHER): Payer: Medicare Other

## 2022-03-06 ENCOUNTER — Ambulatory Visit: Payer: Medicare Other | Admitting: Internal Medicine

## 2022-03-06 DIAGNOSIS — E034 Atrophy of thyroid (acquired): Secondary | ICD-10-CM

## 2022-03-06 DIAGNOSIS — K2940 Chronic atrophic gastritis without bleeding: Secondary | ICD-10-CM | POA: Diagnosis not present

## 2022-03-06 DIAGNOSIS — Z0000 Encounter for general adult medical examination without abnormal findings: Secondary | ICD-10-CM | POA: Diagnosis not present

## 2022-03-06 LAB — URINALYSIS, ROUTINE W REFLEX MICROSCOPIC
Bilirubin Urine: NEGATIVE
Hgb urine dipstick: NEGATIVE
Ketones, ur: NEGATIVE
Nitrite: NEGATIVE
Specific Gravity, Urine: 1.015 (ref 1.000–1.030)
Total Protein, Urine: NEGATIVE
Urine Glucose: NEGATIVE
Urobilinogen, UA: 0.2 (ref 0.0–1.0)
pH: 6.5 (ref 5.0–8.0)

## 2022-03-09 ENCOUNTER — Other Ambulatory Visit: Payer: Self-pay | Admitting: Internal Medicine

## 2022-03-09 DIAGNOSIS — R7989 Other specified abnormal findings of blood chemistry: Secondary | ICD-10-CM

## 2022-03-18 ENCOUNTER — Other Ambulatory Visit: Payer: Self-pay | Admitting: Internal Medicine

## 2022-03-24 ENCOUNTER — Encounter: Payer: Self-pay | Admitting: Family Medicine

## 2022-04-01 NOTE — Progress Notes (Signed)
[facility] [place] [address] [place] Phone: [phone] Subjective:   [name], am serving as a scribe for Dr. Hulan Saas.  I'm seeing this patient by the request  of:  Plotnikov, Evie Lacks, MD  CC: back pain   [id number]  02/17/2022 Tear noted on the right knee. Discussed icing regimen and home exercises, which activities to do and which ones to avoid. Increase activity as tolerated. Follow-up again in 6 to 8 weeks worsening pain advanced imaging may be warranted. Patient will be the primary caregiver for her husband who recently did have hernia surgery. Social determinants of health   Patient given injection today, discussed bracing at night, discussed that this can come back again in 9 to 18 months   04/08/2022 Sneha L Sara is a 72 y.o. female coming in with complaint of R hand ring finger pain and back pain. Finger is doing well. Has bone density results and wants to go over that with you. Back pain R side near scapula.       Past Medical History:  Diagnosis Date   ALLERGIC RHINITIS    ANXIETY    Arthritis    GERD    HSV infection    oral   Hypothyroidism    INSOMNIA-SLEEP DISORDER-UNSPEC    Osteopenia 01/2016   T score -1.2 FRAX 15%/0.9%   Shingles    Thyroid disease    Past Surgical History:  Procedure Laterality Date   ANKLE FRACTURE SURGERY  2012   R   APPENDECTOMY     BREAST SURGERY     REDUCTION MAMMOPLASTY   CESAREAN SECTION     twice   COSMETIC SURGERY     FACELIFT     s/p   HIP SURGERY Right 12/08/2019   Patient reported   [facility] HIP ARTHROPLASTY Left 10/16/2020   Procedure: LEFT TOTAL HIP ARTHROPLASTY ANTERIOR APPROACH;  Surgeon: Melrose Nakayama, MD;  Location: [facility];  Service: Orthopedics;  Laterality: Left;   Upper nl TSH w/sx     VAGINAL HYSTERECTOMY  1991   TVH, irregular bleeding   Social History   Socioeconomic History   Marital status: Married    Spouse  name: Not on file   Number of children: 2   Years of education: Not on file   Highest education level: Not on file  Occupational History   Occupation: Retired    Fish farm manager: SELF EMPLOYED  Tobacco Use   Smoking status: Never   Smokeless tobacco: Never  Vaping Use   Vaping Use: Never used  Substance and Sexual Activity   Alcohol use: Not Currently   Drug use: No   Sexual activity: Yes    Birth control/protection: Post-menopausal, Surgical    Comment: HYST-1st intercourse 72 yo-Fewer than 5 partners  Other Topics Concern   Not on file  Social History Narrative   Regular exercise   2 biol children-1 disabled with schizophrenia & 1 stepchild   Social Determinants of Health   Financial Resource Strain: Not on file  Food Insecurity: Not on file  Transportation Needs: Not on file  Physical Activity: Not on file  Stress: Not on file  Social Connections: Not on file   Allergies  Allergen Reactions   Belviq [Lorcaserin Hcl] Diarrhea   Demerol [Meperidine] Other (See Comments)    Palpitations. Many years ago.   Elavil [Amitriptyline Hcl] Other (See Comments)    constipation   Hydrocodone  Confusion, a very low BP after taking Oxycodone and Norco after hip surgery, would get very orthostatic, syncopal - Oct 2022 and Dec 2021  Can take Tramadol   Oxycodone Other (See Comments)    Confusion, a very low BP after taking Oxycodone and Norco after hip surgery, would get very orthostatic, syncopal - Oct 2022 and Dec 2021  Can take Tramadol   Peanut Oil Itching   Shellfish-Derived Products Itching   Latex Hives, Itching and Rash    Redness    Medical Adhesive Remover Hives, Itching and Rash   Family History  Problem Relation Age of Onset   Heart disease Father        CAD/CABG in his 61's   Hypertension Mother    Heart failure Mother     Current Outpatient Medications (Endocrine & Metabolic):    predniSONE (DELTASONE) 20 MG tablet, Take 1 tablet (20 mg total) by mouth daily  with breakfast.   levothyroxine (SYNTHROID) 125 MCG tablet, Take 1 tablet (125 mcg total) by mouth daily.  Current Outpatient Medications (Cardiovascular):    colestipol (COLESTID) 1 g tablet, 2 tablets Orally at night, 2 hours apart from other medications for 30 days  Current Outpatient Medications (Respiratory):    benzonatate (TESSALON) 100 MG capsule, TAKE 1 TO 2 CAPSULES BY MOUTH THREE TIMES DAILY AS NEEDED   fexofenadine (ALLEGRA) 180 MG tablet, Take 180 mg by mouth daily.   Current Outpatient Medications (Hematological):    Cyanocobalamin (B-12) 2500 MCG TABS, Take 2,500 mcg by mouth daily.  Current Outpatient Medications (Other):    citalopram (CELEXA) 20 MG tablet, Take 1 tablet by mouth once daily   CRANBERRY EXTRACT PO, Take 1 capsule by mouth daily.    D-Mannose 500 MG CAPS, Take 500 mg by mouth daily.   gabapentin (NEURONTIN) 100 MG capsule, Take 1 capsule by mouth twice daily   methocarbamol (ROBAXIN) 500 MG tablet, TAKE 1 TABLET BY MOUTH ONCE DAILY AS NEEDED FOR MUSCLE SPASM   Multiple Vitamin (MULTIVITAMIN) tablet, Take 1 tablet by mouth daily.   NON FORMULARY, Take 1 capsule by mouth at bedtime. Tart cherry extract   OVER THE COUNTER MEDICATION, Apply 1 application topically in the morning and at bedtime. [place]. Apply to scalp twice a day for hair growth.   pantoprazole (PROTONIX) 20 MG tablet, 1 tablet   Probiotic Product (ALIGN) 4 MG CAPS, 1 capsule   traZODone (DESYREL) 50 MG tablet, TAKE 1 TABLET BY MOUTH AT BEDTIME   trimethoprim-polymyxin b (POLYTRIM) ophthalmic solution, Place 1 drop into the left eye every 6 (six) hours.   TURMERIC PO, Take 1 capsule by mouth daily.   Valerian Root 100 MG CAPS, Take 100 mg by mouth at bedtime.   Reviewed prior external information including notes and imaging from  primary care provider As well as notes that were available from care everywhere and other healthcare systems.  Past medical history, social, surgical and  family history all reviewed in electronic medical record.  No pertanent information unless stated regarding to the chief complaint.   Review of Systems:  No headache, visual changes, nausea, vomiting, diarrhea, constipation, dizziness, abdominal pain, skin rash, fevers, chills, night sweats, weight loss, swollen lymph nodes, body aches, joint swelling, chest pain, shortness of breath, mood changes. POSITIVE muscle aches, body aches   Objective  Blood pressure 122/78, pulse 82, height 5\' 1"  (1.549 m), weight 140 lb (63.5 kg), SpO2 98 %.   General: No apparent distress alert and oriented x3 mood  and affect normal, dressed appropriately.  HEENT: Pupils equal, extraocular movements intact  Respiratory: Patient's speak in full sentences and does not appear short of breath  Cardiovascular: No lower extremity edema, non tender, no erythema  Low back does have loss of lordosis tightness noted with straight leg test but no radicular symptoms.tightness FABER      Impression and Recommendations:     The above documentation has been reviewed and is accurate and complete Lyndal Pulley, DO

## 2022-04-08 ENCOUNTER — Other Ambulatory Visit: Payer: Self-pay

## 2022-04-08 ENCOUNTER — Ambulatory Visit (INDEPENDENT_AMBULATORY_CARE_PROVIDER_SITE_OTHER): Payer: Medicare Other

## 2022-04-08 ENCOUNTER — Ambulatory Visit: Payer: Medicare Other | Admitting: Family Medicine

## 2022-04-08 ENCOUNTER — Other Ambulatory Visit: Payer: Self-pay | Admitting: Obstetrics & Gynecology

## 2022-04-08 VITALS — BP 122/78 | HR 82 | Ht 61.0 in | Wt 140.0 lb

## 2022-04-08 DIAGNOSIS — Z780 Asymptomatic menopausal state: Secondary | ICD-10-CM

## 2022-04-08 DIAGNOSIS — Z13820 Encounter for screening for osteoporosis: Secondary | ICD-10-CM | POA: Diagnosis not present

## 2022-04-08 DIAGNOSIS — M8589 Other specified disorders of bone density and structure, multiple sites: Secondary | ICD-10-CM

## 2022-04-08 DIAGNOSIS — M65341 Trigger finger, right ring finger: Secondary | ICD-10-CM | POA: Diagnosis not present

## 2022-04-08 DIAGNOSIS — M5136 Other intervertebral disc degeneration, lumbar region: Secondary | ICD-10-CM

## 2022-04-08 DIAGNOSIS — M5416 Radiculopathy, lumbar region: Secondary | ICD-10-CM

## 2022-04-08 DIAGNOSIS — M545 Low back pain, unspecified: Secondary | ICD-10-CM | POA: Diagnosis not present

## 2022-04-08 DIAGNOSIS — M816 Localized osteoporosis [Lequesne]: Secondary | ICD-10-CM

## 2022-04-08 DIAGNOSIS — Z01419 Encounter for gynecological examination (general) (routine) without abnormal findings: Secondary | ICD-10-CM

## 2022-04-08 DIAGNOSIS — Z9289 Personal history of other medical treatment: Secondary | ICD-10-CM

## 2022-04-08 MED ORDER — PREDNISONE 20 MG PO TABS: 20.0000 mg | ORAL_TABLET | Freq: Every day | ORAL | 0 refills | Status: DC

## 2022-04-08 NOTE — Patient Instructions (Addendum)
Prednisone 20mg  daily start Thursday if you cannot get epidural Epidural [phone] See me 6-8 weeks after the injection

## 2022-04-08 NOTE — Assessment & Plan Note (Addendum)
With spinal stenosis  Discussed HEP  Has responded well epidural previous. Will order a new one  Short course of prednisone given in case patient is not able to get it done in a timely fashion. SDOH- primary caregiver for husband with parkinson and recent hernia surgery.  Discussed also that I think formal physical therapy would be beneficial in order put in today.  Follow-up with me again in 6 to 8 weeks after the injection.

## 2022-04-09 ENCOUNTER — Encounter: Payer: Self-pay | Admitting: Family Medicine

## 2022-04-10 ENCOUNTER — Ambulatory Visit
Admission: RE | Admit: 2022-04-10 | Discharge: 2022-04-10 | Disposition: A | Discharge status: Home / Self Care | Payer: Medicare Other | Source: Ambulatory Visit | Attending: Family Medicine | Admitting: Family Medicine

## 2022-04-10 DIAGNOSIS — M47817 Spondylosis without myelopathy or radiculopathy, lumbosacral region: Secondary | ICD-10-CM | POA: Diagnosis not present

## 2022-04-10 DIAGNOSIS — M5416 Radiculopathy, lumbar region: Secondary | ICD-10-CM

## 2022-04-10 MED ORDER — IOPAMIDOL (ISOVUE-M 200) INJECTION 41%
1.0000 mL | Freq: Once | INTRAMUSCULAR | Status: AC
  Administered 2022-04-10: 1 mL via EPIDURAL

## 2022-04-10 MED ORDER — METHYLPREDNISOLONE ACETATE 40 MG/ML INJ SUSP (RADIOLOG
80.0000 mg | Freq: Once | INTRAMUSCULAR | Status: AC
  Administered 2022-04-10: 80 mg via EPIDURAL

## 2022-04-10 NOTE — Discharge Instructions (Signed)

## 2022-04-11 ENCOUNTER — Other Ambulatory Visit: Payer: Self-pay | Admitting: Internal Medicine

## 2022-04-30 ENCOUNTER — Other Ambulatory Visit: Payer: Self-pay | Admitting: Internal Medicine

## 2022-04-30 ENCOUNTER — Encounter: Payer: Self-pay | Admitting: Obstetrics & Gynecology

## 2022-04-30 ENCOUNTER — Ambulatory Visit: Payer: Medicare Other | Admitting: Obstetrics & Gynecology

## 2022-04-30 VITALS — BP 114/72 | HR 78

## 2022-04-30 DIAGNOSIS — M810 Age-related osteoporosis without current pathological fracture: Secondary | ICD-10-CM

## 2022-04-30 NOTE — Progress Notes (Signed)
    Ellen [name] 08-28-1950 [id number]        72 y.o.  [id number]   RP: Counseling and management of Osteoporosis  HPI: Bone Density on 04/08/22 showing Osteoporosis at the Lt Total Radius, T-Score -2.5.  No current fracture.  No tendency to fall.  Exercising regularly in the yard, picking up sticks.  Taking Calcium 600 mg & Vit D3 2000 IU daily.  OB History  Gravida Para Term Preterm AB Living  SAB IAB Ectopic Multiple Live Births  1            # Outcome Date GA Lbr Len/2nd Weight Sex Delivery Anes PTL Lv  3 SAB           2 Para           1 Para             Past medical history,surgical history, problem list, medications, allergies, family history and social history were all reviewed and documented in the EPIC chart.   Directed ROS with pertinent positives and negatives documented in the history of present illness/assessment and plan.  Exam:  Vitals:   04/30/22 1334  BP: 114/72  Pulse: 78  SpO2: 98%   General appearance:  Normal  Bone density 04/08/22:  Osteoporosis with T-Score of -2.5 at the Lt total Radius.  T-Score -2.4 at the Lt 1/3 Radius.  BD normal at the [facility].   Assessment/Plan:  72 y.o. [id number]   1. Age-related osteoporosis without current pathological fracture Bone Density on 04/08/22 showing Osteoporosis at the Lt Total Radius, T-Score -2.5. T-Score -2.4 at the Lt 1/3 Radius.  BD normal at the [facility].  S/P bilateral hip replacements.  No current fracture.  No tendency to fall.  Exercising regularly in the yard, picking up sticks.  Taking Calcium 600 mg & Vit D3 2000 IU daily.  Bone density findings thoroughly reviewed.  Recommend continuing with Ca++ up to 1500 mg total daily and Vit D 2000 IU.  Recommend to continue weight bearing physical activities, including upper limb weights daily.  Bone medication discussed, but not recommended at this time.  Repeat BD at 2 years.  Patient voiced understanding and agreement.  Other orders - pantoprazole  (PROTONIX) 40 MG tablet; Take 40 mg by mouth daily. - UNABLE TO FIND; Med Name: nutrafol - Probiotic Product (PROBIOTIC PO); Take by mouth. 60 billion - Calcium Carb-Cholecalciferol (CALCIUM + D3 PO); Take by mouth. Calcium  & vit d3 2000 iu   Genia Del MD, 1:43 PM 04/30/2022

## 2022-05-06 DIAGNOSIS — R269 Unspecified abnormalities of gait and mobility: Secondary | ICD-10-CM | POA: Diagnosis not present

## 2022-05-06 DIAGNOSIS — M25571 Pain in right ankle and joints of right foot: Secondary | ICD-10-CM | POA: Diagnosis not present

## 2022-05-22 DIAGNOSIS — R269 Unspecified abnormalities of gait and mobility: Secondary | ICD-10-CM | POA: Diagnosis not present

## 2022-05-22 DIAGNOSIS — M25571 Pain in right ankle and joints of right foot: Secondary | ICD-10-CM | POA: Diagnosis not present

## 2022-05-28 ENCOUNTER — Other Ambulatory Visit (INDEPENDENT_AMBULATORY_CARE_PROVIDER_SITE_OTHER): Payer: Medicare Other

## 2022-05-28 DIAGNOSIS — R7989 Other specified abnormal findings of blood chemistry: Secondary | ICD-10-CM

## 2022-05-28 LAB — T4, FREE: Free T4: 0.96 ng/dL (ref 0.60–1.60)

## 2022-05-28 LAB — TSH: TSH: 1.19 u[IU]/mL (ref 0.35–5.50)

## 2022-05-29 ENCOUNTER — Encounter: Payer: Self-pay | Admitting: Internal Medicine

## 2022-06-03 DIAGNOSIS — R269 Unspecified abnormalities of gait and mobility: Secondary | ICD-10-CM | POA: Diagnosis not present

## 2022-06-03 DIAGNOSIS — M25571 Pain in right ankle and joints of right foot: Secondary | ICD-10-CM | POA: Diagnosis not present

## 2022-06-06 ENCOUNTER — Telehealth: Payer: Medicare Other | Admitting: Physician Assistant

## 2022-06-06 DIAGNOSIS — R197 Diarrhea, unspecified: Secondary | ICD-10-CM

## 2022-06-06 DIAGNOSIS — R112 Nausea with vomiting, unspecified: Secondary | ICD-10-CM

## 2022-06-06 MED ORDER — DICYCLOMINE HCL 10 MG PO CAPS
10.0000 mg | ORAL_CAPSULE | Freq: Three times a day (TID) | ORAL | 0 refills | Status: DC
Start: 2022-06-06 — End: 2022-07-03

## 2022-06-06 MED ORDER — ONDANSETRON HCL 4 MG PO TABS: 4.0000 mg | ORAL_TABLET | Freq: Three times a day (TID) | ORAL | 0 refills | Status: DC | PRN

## 2022-06-06 NOTE — Progress Notes (Signed)
Virtual Visit Consent   Ellen [name], you are scheduled for a virtual visit with a [facility] provider today. Just as with appointments in the office, your consent must be obtained to participate. Your consent will be active for this visit and any virtual visit you may have with one of our providers in the next 365 days. If you have a MyChart account, a copy of this consent can be sent to you electronically.  As this is a virtual visit, video technology does not allow for your provider to perform a traditional examination. This may limit your provider's ability to fully assess your condition. If your provider identifies any concerns that need to be evaluated in person or the need to arrange testing (such as labs, EKG, etc.), we will make arrangements to do so. Although advances in technology are sophisticated, we cannot ensure that it will always work on either your end or our end. If the connection with a video visit is poor, the visit may have to be switched to a telephone visit. With either a video or telephone visit, we are not always able to ensure that we have a secure connection.  By engaging in this virtual visit, you consent to the provision of healthcare and authorize for your insurance to be billed (if applicable) for the services provided during this visit. Depending on your insurance coverage, you may receive a charge related to this service.  I need to obtain your verbal consent now. Are you willing to proceed with your visit today? Ellen [name] has provided verbal consent on 06/06/2022 for a virtual visit (video or telephone). Margaretann Loveless, PA-C  Date: 06/06/2022 9:42 AM  Virtual Visit via Video Note   I, Margaretann Loveless, connected with  Ellen [name]  ([id number], [birth year]) on 06/06/22 at  9:30 AM EDT by a video-enabled telemedicine application and verified that I am speaking with the correct person using two identifiers.  Location: Patient: Virtual Visit Location  Patient: Home Provider: Virtual Visit Location Provider: Home Office   I discussed the limitations of evaluation and management by telemedicine and the availability of in person appointments. The patient expressed understanding and agreed to proceed.    History of Present Illness: Ellen [name] is a 72 y.o. who identifies as a female who was assigned female at birth, and is being seen today for diarrhea, vomiting, nausea.  HPI: GI Problem The primary symptoms include weight loss (6 pounds or so), fatigue, abdominal pain, nausea, vomiting and diarrhea. Primary symptoms do not include fever, melena, hematemesis, hematochezia, myalgias or arthralgias. The illness began more than 7 days ago. The onset was gradual. The problem has been gradually worsening (worsened over the 4-5 days).  The illness is also significant for anorexia (becomes afraid to eat due to vomiting and diarrhea), bloating and tenesmus. The illness does not include chills or constipation.   Had been taking Nutrafol for hair growth for about 2 months.  Sees [facility]. Started on Colestipol in February 2024. Had worked initially and has not worked of recent.  Has tried Imodium, did not take in the last 2-3 days.   Problems:  Patient Active Problem List   Diagnosis Date Noted   Acquired trigger finger of right ring finger 02/17/2022   Degenerative tear of lateral meniscus, right 02/17/2022   Hypotension after procedure 09/03/2021   Chronic scapular pain 06/18/2021   Chronic atrophic gastritis 03/04/2021   Diverticular disease of colon 03/04/2021   Gastroduodenitis 03/04/2021  Hashimoto's thyroiditis 03/04/2021   History of corticosteroid therapy 03/04/2021   Vitamin D deficiency 03/04/2021   Memory change 03/04/2021   Primary localized osteoarthritis of left hip 10/16/2020   Primary osteoarthritis of left hip 10/16/2020   Greater trochanteric bursitis of left hip 08/29/2020   Hair loss 02/29/2020   Preop exam for internal  medicine 11/21/2019   Hip osteoarthritis 09/29/2019   Degenerative disc disease, lumbar 09/29/2019   Agatston CAC score, <100 09/26/2019   Acute medial meniscus tear, right, initial encounter 08/18/2019   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   Dry skin 05/24/2019   UTI (urinary tract infection) 05/23/2019   Nausea 05/09/2019   Strain of muscle of right groin region 03/14/2019   Pes anserinus bursitis of right knee 10/19/2018   Morton's neuroma of left foot 06/08/2018   Acute suppurative otitis media of both ears without spontaneous rupture of tympanic membranes 02/10/2017   ETD (Eustachian tube dysfunction), bilateral 02/10/2017   Dermatitis 01/01/2017   Acute MEE (middle ear effusion) 11/10/2016   Rotator cuff syndrome, right 10/13/2016   Bacterial sinusitis 08/20/2016   Greater trochanteric bursitis of right hip 09/24/2015   Cellulitis and abscess of leg 08/31/2015   Eosinophilia 03/27/2015   Neoplasm of uncertain behavior of skin 12/14/2014   Cough 09/28/2014   Conjunctivitis 02/13/2014   Hypothyroidism 01/13/2014   Dark stools 04/16/2012   RUQ abdominal pain 04/16/2012   Cold sore 11/18/2011   Well adult exam 07/15/2011   Closed right ankle fracture 07/15/2011   Hypertension 07/15/2011   Rash and other nonspecific skin eruption 10/19/2007   OTITIS MEDIA, CHRONIC SEROUS 08/31/2007   Dyslipidemia 07/28/2007   GERD 04/13/2007   Generalized anxiety disorder 10/16/2006   Insomnia 10/16/2006   ALLERGIC RHINITIS 10/16/2006   Cystitis 10/16/2006    Allergies:  Allergies  Allergen Reactions   Belviq [Lorcaserin Hcl] Diarrhea   Demerol [Meperidine] Other (See Comments)    Palpitations. Many years ago.   Elavil [Amitriptyline Hcl] Other (See Comments)    constipation   Hydrocodone     Confusion, a very low BP after taking Oxycodone and Norco after hip surgery, would get very orthostatic, syncopal - Oct 2022 and Dec 2021  Can take Tramadol   Oxycodone Other (See Comments)     Confusion, a very low BP after taking Oxycodone and Norco after hip surgery, would get very orthostatic, syncopal - Oct 2022 and Dec 2021  Can take Tramadol   Peanut Oil Itching   Shellfish-Derived Products Itching   Latex Hives, Itching and Rash    Redness    Medical Adhesive Remover Hives, Itching and Rash   Medications:  Current Outpatient Medications:    benzonatate (TESSALON) 100 MG capsule, TAKE 1 TO 2 CAPSULES BY MOUTH THREE TIMES DAILY AS NEEDED, Disp: 60 capsule, Rfl: 0   Calcium Carb-Cholecalciferol (CALCIUM + D3 PO), Take by mouth. Calcium 600mg  & vit d3 2000 iu, Disp: , Rfl:    citalopram (CELEXA) 20 MG tablet, Take 1 tablet by mouth once daily, Disp: 90 tablet, Rfl: 3   colestipol (COLESTID) 1 g tablet, 2 tablets Orally at night, 2 hours apart from other medications for 30 days, Disp: , Rfl:    CRANBERRY EXTRACT PO, Take 1 capsule by mouth daily. , Disp: , Rfl:    Cyanocobalamin (B-12) 2500 MCG TABS, Take 2,500 mcg by mouth daily., Disp: , Rfl:    D-Mannose 500 MG CAPS, Take 500 mg by mouth daily., Disp: , Rfl:    dicyclomine (  BENTYL) 10 MG capsule, Take 1 capsule (10 mg total) by mouth 4 (four) times daily -  before meals and at bedtime., Disp: 40 capsule, Rfl: 0   fexofenadine (ALLEGRA) 180 MG tablet, Take 180 mg by mouth daily., Disp: , Rfl:    gabapentin (NEURONTIN) 100 MG capsule, Take 1 capsule by mouth twice daily, Disp: 180 capsule, Rfl: 0   levothyroxine (SYNTHROID) 125 MCG tablet, Take 1 tablet by mouth once daily, Disp: 90 tablet, Rfl: 2   Multiple Vitamin (MULTIVITAMIN) tablet, Take 1 tablet by mouth daily., Disp: , Rfl:    NON FORMULARY, Take 1 capsule by mouth at bedtime. Tart cherry extract, Disp: , Rfl:    ondansetron (ZOFRAN) 4 MG tablet, Take 1 tablet (4 mg total) by mouth every 8 (eight) hours as needed for nausea or vomiting., Disp: 20 tablet, Rfl: 0   pantoprazole (PROTONIX) 40 MG tablet, Take 40 mg by mouth daily., Disp: , Rfl:    predniSONE (DELTASONE) 20  MG tablet, Take 1 tablet (20 mg total) by mouth daily with breakfast., Disp: 5 tablet, Rfl: 0   Probiotic Product (PROBIOTIC PO), Take by mouth. 60 billion, Disp: , Rfl:    traZODone (DESYREL) 50 MG tablet, TAKE 1 TABLET BY MOUTH AT BEDTIME, Disp: 90 tablet, Rfl: 0   trimethoprim-polymyxin b (POLYTRIM) ophthalmic solution, Place 1 drop into the left eye every 6 (six) hours., Disp: 10 mL, Rfl: 0   TURMERIC PO, Take 1 capsule by mouth daily., Disp: , Rfl:    UNABLE TO FIND, Med Name: nutrafol, Disp: , Rfl:    Valerian Root 100 MG CAPS, Take 100 mg by mouth at bedtime., Disp: , Rfl:   Observations/Objective: Patient is well-developed, well-nourished in no acute distress.  Resting comfortably  at home.  Head is normocephalic, atraumatic.  No labored breathing.  Speech is clear and coherent with logical content.  Patient is alert and oriented at baseline.    Assessment and Plan: 1. Diarrhea, unspecified type - dicyclomine (BENTYL) 10 MG capsule; Take 1 capsule (10 mg total) by mouth 4 (four) times daily -  before meals and at bedtime.  Dispense: 40 capsule; Refill: 0  2. Nausea and vomiting, unspecified vomiting type - ondansetron (ZOFRAN) 4 MG tablet; Take 1 tablet (4 mg total) by mouth every 8 (eight) hours as needed for nausea or vomiting.  Dispense: 20 tablet; Refill: 0  - Suspect medication side effect possibly from Colestipol since symptoms began after this was started - Zofran for nausea - Dicyclomine for cramping and diarrhea after eating - Continue Imodium as needed - Push fluids, electrolyte beverages - Liquid diet, then increase to soft/bland (BRAT) diet over next day, then increase diet as tolerated - Seek in person evaluation if not improving or symptoms worsen   Follow Up Instructions: I discussed the assessment and treatment plan with the patient. The patient was provided an opportunity to ask questions and all were answered. The patient agreed with the plan and  demonstrated an understanding of the instructions.  A copy of instructions were sent to the patient via MyChart unless otherwise noted below.    The patient was advised to call back or seek an in-person evaluation if the symptoms worsen or if the condition fails to improve as anticipated.  Time:  I spent 12 minutes with the patient via telehealth technology discussing the above problems/concerns.    Margaretann Loveless, PA-C

## 2022-06-06 NOTE — Patient Instructions (Signed)
Ellen [name], thank you for joining Margaretann Loveless, PA-C for today's virtual visit.  While this provider is not your primary care provider (PCP), if your PCP is located in our provider database this encounter information will be shared with them immediately following your visit.   A [facility] MyChart account gives you access to today's visit and all your visits, tests, and labs performed at [facility] " click here if you don't have a [facility] MyChart account or go to [email or web address]  Consent: (Patient) Ellen [name] provided verbal consent for this virtual visit at the beginning of the encounter.  Current Medications:  Current Outpatient Medications:    benzonatate (TESSALON) 100 MG capsule, TAKE 1 TO 2 CAPSULES BY MOUTH THREE TIMES DAILY AS NEEDED, Disp: 60 capsule, Rfl: 0   Calcium Carb-Cholecalciferol (CALCIUM + D3 PO), Take by mouth. Calcium 600mg  & vit d3 2000 iu, Disp: , Rfl:    citalopram (CELEXA) 20 MG tablet, Take 1 tablet by mouth once daily, Disp: 90 tablet, Rfl: 3   colestipol (COLESTID) 1 g tablet, 2 tablets Orally at night, 2 hours apart from other medications for 30 days, Disp: , Rfl:    CRANBERRY EXTRACT PO, Take 1 capsule by mouth daily. , Disp: , Rfl:    Cyanocobalamin (B-12) 2500 MCG TABS, Take 2,500 mcg by mouth daily., Disp: , Rfl:    D-Mannose 500 MG CAPS, Take 500 mg by mouth daily., Disp: , Rfl:    dicyclomine (BENTYL) 10 MG capsule, Take 1 capsule (10 mg total) by mouth 4 (four) times daily -  before meals and at bedtime., Disp: 40 capsule, Rfl: 0   fexofenadine (ALLEGRA) 180 MG tablet, Take 180 mg by mouth daily., Disp: , Rfl:    gabapentin (NEURONTIN) 100 MG capsule, Take 1 capsule by mouth twice daily, Disp: 180 capsule, Rfl: 0   levothyroxine (SYNTHROID) 125 MCG tablet, Take 1 tablet by mouth once daily, Disp: 90 tablet, Rfl: 2   Multiple Vitamin (MULTIVITAMIN) tablet, Take 1 tablet by mouth daily., Disp: , Rfl:    NON FORMULARY,  Take 1 capsule by mouth at bedtime. Tart cherry extract, Disp: , Rfl:    ondansetron (ZOFRAN) 4 MG tablet, Take 1 tablet (4 mg total) by mouth every 8 (eight) hours as needed for nausea or vomiting., Disp: 20 tablet, Rfl: 0   pantoprazole (PROTONIX) 40 MG tablet, Take 40 mg by mouth daily., Disp: , Rfl:    predniSONE (DELTASONE) 20 MG tablet, Take 1 tablet (20 mg total) by mouth daily with breakfast., Disp: 5 tablet, Rfl: 0   Probiotic Product (PROBIOTIC PO), Take by mouth. 60 billion, Disp: , Rfl:    traZODone (DESYREL) 50 MG tablet, TAKE 1 TABLET BY MOUTH AT BEDTIME, Disp: 90 tablet, Rfl: 0   trimethoprim-polymyxin b (POLYTRIM) ophthalmic solution, Place 1 drop into the left eye every 6 (six) hours., Disp: 10 mL, Rfl: 0   TURMERIC PO, Take 1 capsule by mouth daily., Disp: , Rfl:    UNABLE TO FIND, Med Name: nutrafol, Disp: , Rfl:    Valerian Root 100 MG CAPS, Take 100 mg by mouth at bedtime., Disp: , Rfl:    Medications ordered in this encounter:  Meds ordered this encounter  Medications   dicyclomine (BENTYL) 10 MG capsule    Sig: Take 1 capsule (10 mg total) by mouth 4 (four) times daily -  before meals and at bedtime.    Dispense:  40 capsule    Refill:  0    Order Specific Question:   Supervising Provider    Answer:   Merrilee Jansky [[id number]]   ondansetron (ZOFRAN) 4 MG tablet    Sig: Take 1 tablet (4 mg total) by mouth every 8 (eight) hours as needed for nausea or vomiting.    Dispense:  20 tablet    Refill:  0    Order Specific Question:   Supervising Provider    Answer:   Merrilee Jansky [id number]     *If you need refills on other medications prior to your next appointment, please contact your pharmacy*  Follow-Up: Call back or seek an in-person evaluation if the symptoms worsen or if the condition fails to improve as anticipated.  [facility] [phone]    If you have been instructed to have an in-person evaluation today at a local Urgent Care  facility, please use the link below. It will take you to a list of all of our available [facility], including address, phone number and hours of operation. Please do not delay care.  [facility]  If you or a family member do not have a primary care provider, use the link below to schedule a visit and establish care. When you choose a [facility] primary care physician or advanced practice provider, you gain a long-term partner in health. Find a Primary Care Provider  Learn more about [facility]'s in-office and virtual care options: [facility] - Get Care Now

## 2022-06-07 ENCOUNTER — Encounter: Payer: Self-pay | Admitting: Internal Medicine

## 2022-06-07 ENCOUNTER — Encounter: Payer: Self-pay | Admitting: Physician Assistant

## 2022-06-23 ENCOUNTER — Other Ambulatory Visit: Payer: Self-pay | Admitting: Internal Medicine

## 2022-06-23 NOTE — Progress Notes (Unsigned)
[name] [address] [place] [address] Phone: [phone] Subjective:   Bruce Donath, am serving as a scribe for Dr. Antoine Primas.  I'm seeing this patient by the request  of:  Plotnikov, Georgina Quint, MD  CC: Back pain follow-up  [id number]  04/08/2022 With spinal stenosis  Discussed HEP  Has responded well epidural previous. Will order a new one  Short course of prednisone given in case patient is not able to get it done in a timely fashion. SDOH- primary caregiver for husband with parkinson and recent hernia surgery.  Discussed also that I think formal physical therapy would be beneficial in order put in today.  Follow-up with me again in 6 to 8 weeks after the injection.  Updated 06/24/2022 Ellen [name] is a 72 y.o. female coming in with complaint of back pain. Epi on 04/10/2022. Patient has had relief from epidural but she moved recently and this aggravated her back. Patient's main c/o today is pain in the lateral compartment of L lower leg. Pain is tingling in character. No tingling in the toes.       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   Procedure: LEFT TOTAL HIP ARTHROPLASTY ANTERIOR APPROACH;  Surgeon: Marcene Corning, MD;  Location: [facility];  Service: Orthopedics;  Laterality: Left;   Upper nl TSH w/sx     VAGINAL HYSTERECTOMY  1991   TVH, irregular bleeding   Social  History   Socioeconomic History   Marital status: Married    Spouse name: Not on file   Number of children: 2   Years of education: Not on file   Highest education level: Not on file  Occupational History   Occupation: Retired    Associate Professor: SELF EMPLOYED  Tobacco Use   Smoking status: Never   Smokeless tobacco: Never  Vaping Use   Vaping Use: Never used  Substance and Sexual Activity   Alcohol use: Yes    Comment: wine occ   Drug use: No   Sexual activity: Yes    Birth control/protection: Post-menopausal, Surgical    Comment: HYST-1st intercourse 72 yo-Fewer than 5 partners  Other Topics Concern   Not on file  Social History Narrative   Regular exercise   2 biol children-1 disabled with schizophrenia & 1 stepchild   Social Determinants of Health   Financial Resource Strain: Not on file  Food Insecurity: Not on file  Transportation Needs: Not on file  Physical Activity: Not on file  Stress: Not on file  Social Connections: Not on file   Allergies  Allergen Reactions   Belviq [Lorcaserin Hcl] Diarrhea   Demerol [Meperidine] Other (See Comments)    Palpitations. Many years ago.  Elavil [Amitriptyline Hcl] Other (See Comments)    constipation   Hydrocodone     Confusion, a very low BP after taking Oxycodone and Norco after hip surgery, would get very orthostatic, syncopal - Oct 2022 and Dec 2021  Can take Tramadol   Oxycodone Other (See Comments)    Confusion, a very low BP after taking Oxycodone and Norco after hip surgery, would get very orthostatic, syncopal - Oct 2022 and Dec 2021  Can take Tramadol   Peanut Oil Itching   Shellfish-Derived Products Itching   Latex Hives, Itching and Rash    Redness    Medical Adhesive Remover Hives, Itching and Rash   Family History  Problem Relation Age of Onset   Heart disease Father        CAD/CABG in his 18's   Hypertension Mother    Heart failure Mother     Current Outpatient Medications (Endocrine & Metabolic):     levothyroxine (SYNTHROID) 125 MCG tablet, Take 1 tablet by mouth once daily   predniSONE (DELTASONE) 20 MG tablet, Take 1 tablet (20 mg total) by mouth daily with breakfast.  Current Outpatient Medications (Cardiovascular):    colestipol (COLESTID) 1 g tablet, 2 tablets Orally at night, 2 hours apart from other medications for 30 days  Current Outpatient Medications (Respiratory):    benzonatate (TESSALON) 100 MG capsule, TAKE 1 TO 2 CAPSULES BY MOUTH THREE TIMES DAILY AS NEEDED   fexofenadine (ALLEGRA) 180 MG tablet, Take 180 mg by mouth daily.   Current Outpatient Medications (Hematological):    Cyanocobalamin (B-12) 2500 MCG TABS, Take 2,500 mcg by mouth daily.  Current Outpatient Medications (Other):    Calcium Carb-Cholecalciferol (CALCIUM + D3 PO), Take by mouth. Calcium 600mg  & vit d3 2000 iu   citalopram (CELEXA) 20 MG tablet, Take 1 tablet by mouth once daily   CRANBERRY EXTRACT PO, Take 1 capsule by mouth daily.    D-Mannose 500 MG CAPS, Take 500 mg by mouth daily.   dicyclomine (BENTYL) 10 MG capsule, Take 1 capsule (10 mg total) by mouth 4 (four) times daily -  before meals and at bedtime.   gabapentin (NEURONTIN) 100 MG capsule, Take 1 capsule by mouth twice daily   Multiple Vitamin (MULTIVITAMIN) tablet, Take 1 tablet by mouth daily.   NON FORMULARY, Take 1 capsule by mouth at bedtime. Tart cherry extract   ondansetron (ZOFRAN) 4 MG tablet, Take 1 tablet (4 mg total) by mouth every 8 (eight) hours as needed for nausea or vomiting.   pantoprazole (PROTONIX) 40 MG tablet, Take 40 mg by mouth daily.   Probiotic Product (PROBIOTIC PO), Take by mouth. 60 billion   traZODone (DESYREL) 50 MG tablet, TAKE 1 TABLET BY MOUTH AT BEDTIME   trimethoprim-polymyxin b (POLYTRIM) ophthalmic solution, Place 1 drop into the left eye every 6 (six) hours.   TURMERIC PO, Take 1 capsule by mouth daily.   UNABLE TO FIND, Med Name: nutrafol   Valerian Root 100 MG CAPS, Take 100 mg by mouth at  bedtime.   Reviewed prior external information including notes and imaging from  primary care provider As well as notes that were available from care everywhere and other healthcare systems.  Since we have seen patient has been seen by primary care provider for bone density and thyroid disease.  Has seen also for worsening IBS symptoms.  Past medical history, social, surgical and family history all reviewed in electronic medical record.  No pertanent information unless stated regarding to the  chief complaint.   Review of Systems:  No headache, visual changes, nausea, vomiting, diarrhea, constipation, dizziness, abdominal pain, skin rash, fevers, chills, night sweats, weight loss, swollen lymph nodes, body aches, joint swelling, chest pain, shortness of breath, mood changes. POSITIVE muscle aches  Objective  Blood pressure 108/76, pulse 61, height 5\' 1"  (1.549 m), weight 140 lb (63.5 kg), SpO2 98 %.   General: No apparent distress alert and oriented x3 mood and affect normal, dressed appropriately.  HEENT: Pupils equal, extraocular movements intact  Respiratory: Patient's speak in full sentences and does not appear short of breath  Cardiovascular: No lower extremity edema, non tender, no erythema  Patient back does have some loss lordosis noted.  Some tenderness to palpation noted.  Patient does have some tightness with straight leg test.  Worsening pain with extension of greater than 5 degrees.    Impression and Recommendations:     The above documentation has been reviewed and is accurate and complete Judi Saa, DO

## 2022-06-24 ENCOUNTER — Ambulatory Visit: Payer: Medicare Other | Admitting: Family Medicine

## 2022-06-24 VITALS — BP 108/76 | HR 61 | Ht 61.0 in | Wt 140.0 lb

## 2022-06-24 DIAGNOSIS — M5136 Other intervertebral disc degeneration, lumbar region: Secondary | ICD-10-CM | POA: Diagnosis not present

## 2022-06-24 DIAGNOSIS — M5416 Radiculopathy, lumbar region: Secondary | ICD-10-CM

## 2022-06-24 NOTE — Assessment & Plan Note (Signed)
No nerve impingement with moderate spinal stenosis at the L3-L4.  The patient's localization of the pain seems to be better but he is having radicular symptoms in the L4 distribution.  We discussed with patient at great length.  Encouraged her to do some different exercises.  Do feel repeating the epidural could be beneficial.  Patient did do significantly more activity than what you would normally do and we discussed maybe taking it slower.  Patient will follow-up with me again in 6 to 8 weeks after the epidural

## 2022-06-24 NOTE — Patient Instructions (Addendum)
Epidural [phone] See me again in 2 months

## 2022-06-27 NOTE — Discharge Instructions (Signed)

## 2022-06-30 ENCOUNTER — Ambulatory Visit
Admission: RE | Admit: 2022-06-30 | Discharge: 2022-06-30 | Disposition: A | Discharge status: Home / Self Care | Payer: Medicare Other | Source: Ambulatory Visit | Attending: Family Medicine | Admitting: Family Medicine

## 2022-06-30 DIAGNOSIS — M4727 Other spondylosis with radiculopathy, lumbosacral region: Secondary | ICD-10-CM | POA: Diagnosis not present

## 2022-06-30 DIAGNOSIS — M5416 Radiculopathy, lumbar region: Secondary | ICD-10-CM

## 2022-06-30 MED ORDER — IOPAMIDOL (ISOVUE-M 200) INJECTION 41%
1.0000 mL | Freq: Once | INTRAMUSCULAR | Status: AC
  Administered 2022-06-30: 1 mL via EPIDURAL

## 2022-06-30 MED ORDER — METHYLPREDNISOLONE ACETATE 40 MG/ML INJ SUSP (RADIOLOG
80.0000 mg | Freq: Once | INTRAMUSCULAR | Status: AC
  Administered 2022-06-30: 80 mg via EPIDURAL

## 2022-07-01 DIAGNOSIS — R109 Unspecified abdominal pain: Secondary | ICD-10-CM | POA: Diagnosis not present

## 2022-07-01 DIAGNOSIS — K644 Residual hemorrhoidal skin tags: Secondary | ICD-10-CM | POA: Diagnosis not present

## 2022-07-01 DIAGNOSIS — K219 Gastro-esophageal reflux disease without esophagitis: Secondary | ICD-10-CM | POA: Diagnosis not present

## 2022-07-01 DIAGNOSIS — K31A0 Gastric intestinal metaplasia, unspecified: Secondary | ICD-10-CM | POA: Diagnosis not present

## 2022-07-02 ENCOUNTER — Other Ambulatory Visit: Payer: Self-pay | Admitting: Internal Medicine

## 2022-07-03 ENCOUNTER — Encounter: Payer: Self-pay | Admitting: Internal Medicine

## 2022-07-03 ENCOUNTER — Ambulatory Visit (INDEPENDENT_AMBULATORY_CARE_PROVIDER_SITE_OTHER): Payer: Medicare Other | Admitting: Internal Medicine

## 2022-07-03 VITALS — BP 120/78 | HR 59 | Temp 98.6°F | Ht 61.0 in | Wt 135.0 lb

## 2022-07-03 DIAGNOSIS — N390 Urinary tract infection, site not specified: Secondary | ICD-10-CM | POA: Diagnosis not present

## 2022-07-03 DIAGNOSIS — K589 Irritable bowel syndrome without diarrhea: Secondary | ICD-10-CM | POA: Insufficient documentation

## 2022-07-03 DIAGNOSIS — K649 Unspecified hemorrhoids: Secondary | ICD-10-CM | POA: Diagnosis not present

## 2022-07-03 DIAGNOSIS — K580 Irritable bowel syndrome with diarrhea: Secondary | ICD-10-CM | POA: Diagnosis not present

## 2022-07-03 MED ORDER — HYDROCORTISONE ACETATE 25 MG RE SUPP: 25.0000 mg | Freq: Two times a day (BID) | RECTAL | 1 refills | Status: AC | PRN

## 2022-07-03 MED ORDER — TRIAMCINOLONE ACETONIDE 0.5 % EX OINT: TOPICAL_OINTMENT | CUTANEOUS | 3 refills | Status: AC

## 2022-07-03 MED ORDER — DIPHENOXYLATE-ATROPINE 2.5-0.025 MG PO TABS: 1.0000 | ORAL_TABLET | Freq: Four times a day (QID) | ORAL | 2 refills | Status: DC | PRN

## 2022-07-03 NOTE — Assessment & Plan Note (Addendum)
External hemorrhoids - (GI - Dr Levora Angel) Start Diltiazem 2% Lidocaine 2% Cream, mixed 1:1, as directed, topically, apply to rectum Three times a day and 30 minutes prior to defecation PRN rectal pain Treat IBS-D Triamc oint qid prn

## 2022-07-03 NOTE — Progress Notes (Signed)
Subjective:  Patient ID: Ellen [name], female    DOB: Jun 09, 1950  Age: 72 y.o. MRN: [id number]  CC: Follow-up   HPI Ellen [name] presents for UTI and hemorrhoids follow-up  Outpatient Medications Prior to Visit  Medication Sig Dispense Refill   Calcium Carb-Cholecalciferol (CALCIUM + D3 PO) Take by mouth. Calcium 600mg  & vit d3 2000 iu     citalopram (CELEXA) 20 MG tablet Take 1 tablet by mouth once daily 90 tablet 3   CRANBERRY EXTRACT PO Take 1 capsule by mouth daily.      Cyanocobalamin (B-12) 2500 MCG TABS Take 2,500 mcg by mouth daily.     D-Mannose 500 MG CAPS Take 500 mg by mouth daily.     fexofenadine (ALLEGRA) 180 MG tablet Take 180 mg by mouth daily.     levothyroxine (SYNTHROID) 125 MCG tablet Take 1 tablet by mouth once daily 90 tablet 2   Multiple Vitamin (MULTIVITAMIN) tablet Take 1 tablet by mouth daily.     NON FORMULARY Take 1 capsule by mouth at bedtime. Tart cherry extract     ondansetron (ZOFRAN) 4 MG tablet Take 1 tablet (4 mg total) by mouth every 8 (eight) hours as needed for nausea or vomiting. 20 tablet 0   pantoprazole (PROTONIX) 40 MG tablet Take 40 mg by mouth daily.     Probiotic Product (PROBIOTIC PO) Take by mouth. 60 billion     traZODone (DESYREL) 50 MG tablet TAKE 1 TABLET BY MOUTH AT BEDTIME 90 tablet 1   trimethoprim-polymyxin b (POLYTRIM) ophthalmic solution Place 1 drop into the left eye every 6 (six) hours. 10 mL 0   TURMERIC PO Take 1 capsule by mouth daily.     UNABLE TO FIND Med Name: nutrafol     Valerian Root 100 MG CAPS Take 100 mg by mouth at bedtime.     benzonatate (TESSALON) 100 MG capsule TAKE 1 TO 2 CAPSULES BY MOUTH THREE TIMES DAILY AS NEEDED (Patient not taking: Reported on 07/03/2022) 60 capsule 0   colestipol (COLESTID) 1 g tablet 2 tablets Orally at night, 2 hours apart from other medications for 30 days     dicyclomine (BENTYL) 10 MG capsule Take 1 capsule (10 mg total) by mouth 4 (four) times daily -  before meals and at  bedtime. 40 capsule 0   gabapentin (NEURONTIN) 100 MG capsule Take 1 capsule by mouth twice daily 180 capsule 0   predniSONE (DELTASONE) 20 MG tablet Take 1 tablet (20 mg total) by mouth daily with breakfast. 5 tablet 0   No facility-administered medications prior to visit.    ROS: Review of Systems  Constitutional:  Negative for activity change, appetite change, chills, fatigue and unexpected weight change.  HENT:  Negative for congestion, mouth sores and sinus pressure.   Eyes:  Negative for visual disturbance.  Respiratory:  Negative for cough and chest tightness.   Gastrointestinal:  Positive for rectal pain. Negative for abdominal pain and nausea.  Genitourinary:  Positive for frequency. Negative for difficulty urinating and vaginal pain.  Musculoskeletal:  Negative for back pain and gait problem.  Skin:  Negative for pallor and rash.  Neurological:  Negative for dizziness, tremors, weakness, numbness and headaches.  Psychiatric/Behavioral:  Negative for confusion and sleep disturbance.    Hemorrhoids  Objective:  BP 120/78 (BP Location: Right Arm, Patient Position: Sitting, Cuff Size: Large)   Pulse (!) 59   Temp 98.6 F (37 C) (Oral)   Ht 5\' 1"  (1.549  m)   Wt 135 lb (61.2 kg)   SpO2 98%   BMI 25.51 kg/m   BP Readings from Last 3 Encounters:  07/03/22 120/78  06/30/22 138/75  06/24/22 108/76    Wt Readings from Last 3 Encounters:  07/03/22 135 lb (61.2 kg)  06/24/22 140 lb (63.5 kg)  04/08/22 140 lb (63.5 kg)    Physical Exam Constitutional:      General: She is not in acute distress.    Appearance: Normal appearance. She is well-developed.  HENT:     Head: Normocephalic.     Right Ear: External ear normal.     Left Ear: External ear normal.     Nose: Nose normal.  Eyes:     General:        Right eye: No discharge.        Left eye: No discharge.     Conjunctiva/sclera: Conjunctivae normal.     Pupils: Pupils are equal, round, and reactive to light.   Neck:     Thyroid: No thyromegaly.     Vascular: No JVD.     Trachea: No tracheal deviation.  Cardiovascular:     Rate and Rhythm: Normal rate and regular rhythm.     Heart sounds: Normal heart sounds.  Pulmonary:     Effort: No respiratory distress.     Breath sounds: No stridor. No wheezing.  Abdominal:     General: Bowel sounds are normal. There is no distension.     Palpations: Abdomen is soft. There is no mass.     Tenderness: There is no abdominal tenderness. There is no guarding or rebound.  Musculoskeletal:        General: No tenderness.     Cervical back: Normal range of motion and neck supple. No rigidity.  Lymphadenopathy:     Cervical: No cervical adenopathy.  Skin:    Findings: No erythema or rash.  Neurological:     Cranial Nerves: No cranial nerve deficit.     Motor: No abnormal muscle tone.     Coordination: Coordination normal.     Deep Tendon Reflexes: Reflexes normal.  Psychiatric:        Behavior: Behavior normal.        Thought Content: Thought content normal.        Judgment: Judgment normal.     Lab Results  Component Value Date   WBC 4.7 03/05/2022   HGB 14.6 03/05/2022   HCT 43.7 03/05/2022   PLT 230.0 03/05/2022   GLUCOSE 82 03/05/2022   CHOL 193 03/05/2022   TRIG 153.0 (H) 03/05/2022   HDL 55.40 03/05/2022   LDLDIRECT 142.6 07/09/2011   LDLCALC 107 (H) 03/05/2022   ALT 13 03/05/2022   AST 20 03/05/2022   NA 141 03/05/2022   K 4.4 03/05/2022   CL 106 03/05/2022   CREATININE 0.73 03/05/2022   BUN 13 03/05/2022   CO2 28 03/05/2022   TSH 1.19 05/28/2022   INR 0.9 10/16/2020   HGBA1C 5.6 10/12/2006    DG INJECT DIAG/THERA/INC NEEDLE/CATH/PLC EPI/LUMB/SAC W/IMG  Result Date: 06/30/2022 CLINICAL DATA:  Lumbosacral spondylosis without myelopathy. 85% relief following the initial epidural injection at L3-4 on 04/10/2022. Pain has recurred in the low back and left lower leg. Specific request for an injection at L4-5 today. FLUOROSCOPY:  Radiation Exposure Index (as provided by the fluoroscopic device): 2.80 mGy Kerma PROCEDURE: The procedure, risks, benefits, and alternatives were explained to the patient. Questions regarding the procedure were encouraged and answered.  The patient understands and consents to the procedure. LUMBAR EPIDURAL INJECTION: An interlaminar approach was performed on the left at L4-5. The overlying skin was cleansed and anesthetized. A 3.5 inch 20 gauge epidural needle was advanced using loss-of-resistance technique. DIAGNOSTIC EPIDURAL INJECTION Injection of Isovue-M 200 shows a good epidural pattern with spread above and below the level of needle placement primarily on the left. No vascular opacification is seen. THERAPEUTIC EPIDURAL INJECTION: 80 mg of Depo-Medrol mixed with 3 mL of 1% lidocaine were instilled. The procedure was well-tolerated, and the patient was discharged thirty minutes following the injection in good condition. COMPLICATIONS: None immediate IMPRESSION: Technically successful interlaminar epidural injection on the left at L4-5. Electronically Signed   By: Sebastian Ache M.D.   On: 06/30/2022 14:21    Assessment & Plan:   Problem List Items Addressed This Visit     UTI (urinary tract infection)    No relapse      Hemorrhoids - Primary    External hemorrhoids - (GI - Dr Levora Angel) Start Diltiazem 2% Lidocaine 2% Cream, mixed 1:1, as directed, topically, apply to rectum Three times a day and 30 minutes prior to defecation PRN rectal pain Treat IBS-D Triamc oint qid prn      IBS (irritable bowel syndrome)    IBS-d - (GI - Dr Levora Angel) Start Metamucil daily Lomotil prn Lactose free       Relevant Medications   diphenoxylate-atropine (LOMOTIL) 2.5-0.025 MG tablet      Meds ordered this encounter  Medications   triamcinolone ointment (KENALOG) 0.5 %    Sig: Use qid prn    Dispense:  90 g    Refill:  3   hydrocortisone (ANUSOL-HC) 25 MG suppository    Sig: Place 1  suppository (25 mg total) rectally 2 (two) times daily as needed for hemorrhoids or anal itching.    Dispense:  20 suppository    Refill:  1   diphenoxylate-atropine (LOMOTIL) 2.5-0.025 MG tablet    Sig: Take 1-2 tablets by mouth 4 (four) times daily as needed for diarrhea or loose stools.    Dispense:  60 tablet    Refill:  2      Follow-up: Return in about 3 months (around 10/03/2022) for a follow-up visit.  Sonda Primes, MD

## 2022-07-03 NOTE — Assessment & Plan Note (Signed)
IBS-d - (GI - Dr Levora Angel) Start Metamucil daily Lomotil prn Lactose free

## 2022-07-07 ENCOUNTER — Encounter: Payer: Self-pay | Admitting: Internal Medicine

## 2022-07-08 DIAGNOSIS — R269 Unspecified abnormalities of gait and mobility: Secondary | ICD-10-CM | POA: Diagnosis not present

## 2022-07-08 DIAGNOSIS — M25571 Pain in right ankle and joints of right foot: Secondary | ICD-10-CM | POA: Diagnosis not present

## 2022-07-08 NOTE — Assessment & Plan Note (Signed)
No relapse 

## 2022-07-15 DIAGNOSIS — M6281 Muscle weakness (generalized): Secondary | ICD-10-CM | POA: Diagnosis not present

## 2022-07-15 DIAGNOSIS — R269 Unspecified abnormalities of gait and mobility: Secondary | ICD-10-CM | POA: Diagnosis not present

## 2022-07-15 DIAGNOSIS — M5459 Other low back pain: Secondary | ICD-10-CM | POA: Diagnosis not present

## 2022-07-15 DIAGNOSIS — M25571 Pain in right ankle and joints of right foot: Secondary | ICD-10-CM | POA: Diagnosis not present

## 2022-08-13 ENCOUNTER — Encounter: Payer: Self-pay | Admitting: Internal Medicine

## 2022-08-13 ENCOUNTER — Ambulatory Visit (INDEPENDENT_AMBULATORY_CARE_PROVIDER_SITE_OTHER): Payer: Medicare Other | Admitting: Internal Medicine

## 2022-08-13 VITALS — BP 118/60 | HR 73 | Temp 98.4°F | Ht 61.0 in | Wt 137.0 lb

## 2022-08-13 DIAGNOSIS — F411 Generalized anxiety disorder: Secondary | ICD-10-CM | POA: Diagnosis not present

## 2022-08-13 DIAGNOSIS — E559 Vitamin D deficiency, unspecified: Secondary | ICD-10-CM

## 2022-08-13 DIAGNOSIS — N951 Menopausal and female climacteric states: Secondary | ICD-10-CM | POA: Diagnosis not present

## 2022-08-13 MED ORDER — ESTRADIOL 0.025 MG/24HR TD PTWK: 0.0250 mg | MEDICATED_PATCH | TRANSDERMAL | 5 refills | Status: DC

## 2022-08-13 NOTE — Progress Notes (Signed)
Subjective:  Patient ID: Ellen [name], female    DOB: 11-10-1950  Age: 72 y.o. MRN: [id number]  CC: Follow-up   HPI Ellen [name] presents for HRT - menopause since late 56s. Pt never took HRT... C/o memory issues, hair loss, hot flashes/sweats S/p partial hysterectomy   Outpatient Medications Prior to Visit  Medication Sig Dispense Refill   Calcium Carb-Cholecalciferol (CALCIUM + D3 PO) Take by mouth. Calcium 600mg  & vit d3 2000 iu     citalopram (CELEXA) 20 MG tablet Take 1 tablet by mouth once daily 90 tablet 3   CRANBERRY EXTRACT PO Take 1 capsule by mouth daily.      Cyanocobalamin (B-12) 2500 MCG TABS Take 2,500 mcg by mouth daily.     D-Mannose 500 MG CAPS Take 500 mg by mouth daily.     diphenoxylate-atropine (LOMOTIL) 2.5-0.025 MG tablet Take 1-2 tablets by mouth 4 (four) times daily as needed for diarrhea or loose stools. 60 tablet 2   fexofenadine (ALLEGRA) 180 MG tablet Take 180 mg by mouth daily.     hydrocortisone (ANUSOL-HC) 25 MG suppository Place 1 suppository (25 mg total) rectally 2 (two) times daily as needed for hemorrhoids or anal itching. 20 suppository 1   levothyroxine (SYNTHROID) 125 MCG tablet Take 1 tablet by mouth once daily 90 tablet 2   Multiple Vitamin (MULTIVITAMIN) tablet Take 1 tablet by mouth daily.     NON FORMULARY Take 1 capsule by mouth at bedtime. Tart cherry extract     ondansetron (ZOFRAN) 4 MG tablet Take 1 tablet (4 mg total) by mouth every 8 (eight) hours as needed for nausea or vomiting. 20 tablet 0   pantoprazole (PROTONIX) 40 MG tablet Take 40 mg by mouth daily.     Probiotic Product (PROBIOTIC PO) Take by mouth. 60 billion     traZODone (DESYREL) 50 MG tablet TAKE 1 TABLET BY MOUTH AT BEDTIME 90 tablet 1   triamcinolone ointment (KENALOG) 0.5 % Use qid prn 90 g 3   trimethoprim-polymyxin b (POLYTRIM) ophthalmic solution Place 1 drop into the left eye every 6 (six) hours. 10 mL 0   TURMERIC PO Take 1 capsule by mouth daily.      UNABLE TO FIND Med Name: nutrafol     Valerian Root 100 MG CAPS Take 100 mg by mouth at bedtime.     benzonatate (TESSALON) 100 MG capsule TAKE 1 TO 2 CAPSULES BY MOUTH THREE TIMES DAILY AS NEEDED (Patient not taking: Reported on 07/03/2022) 60 capsule 0   No facility-administered medications prior to visit.    ROS: Review of Systems  Constitutional:  Positive for diaphoresis. Negative for activity change, appetite change, chills, fatigue and unexpected weight change.  HENT:  Negative for congestion, mouth sores and sinus pressure.   Eyes:  Negative for visual disturbance.  Respiratory:  Negative for cough and chest tightness.   Gastrointestinal:  Negative for abdominal pain and nausea.  Genitourinary:  Negative for difficulty urinating, frequency and vaginal pain.  Musculoskeletal:  Negative for back pain and gait problem.  Skin:  Negative for pallor and rash.  Neurological:  Negative for dizziness, tremors, weakness, numbness and headaches.  Psychiatric/Behavioral:  Negative for confusion and sleep disturbance.     Objective:  BP 118/60 (BP Location: Right Arm, Patient Position: Sitting, Cuff Size: Normal)   Pulse 73   Temp 98.4 F (36.9 C) (Oral)   Ht 5\' 1"  (1.549 m)   Wt 137 lb (62.1 kg)   SpO2  96%   BMI 25.89 kg/m   BP Readings from Last 3 Encounters:  08/13/22 118/60  07/03/22 120/78  06/30/22 138/75    Wt Readings from Last 3 Encounters:  08/13/22 137 lb (62.1 kg)  07/03/22 135 lb (61.2 kg)  06/24/22 140 lb (63.5 kg)    Physical Exam Constitutional:      General: She is not in acute distress.    Appearance: Normal appearance. She is well-developed.  HENT:     Head: Normocephalic.     Right Ear: External ear normal.     Left Ear: External ear normal.     Nose: Nose normal.  Eyes:     General:        Right eye: No discharge.        Left eye: No discharge.     Conjunctiva/sclera: Conjunctivae normal.     Pupils: Pupils are equal, round, and reactive to  light.  Neck:     Thyroid: No thyromegaly.     Vascular: No JVD.     Trachea: No tracheal deviation.  Cardiovascular:     Rate and Rhythm: Normal rate and regular rhythm.     Heart sounds: Normal heart sounds.  Pulmonary:     Effort: No respiratory distress.     Breath sounds: No stridor. No wheezing.  Abdominal:     General: Bowel sounds are normal. There is no distension.     Palpations: Abdomen is soft. There is no mass.     Tenderness: There is no abdominal tenderness. There is no guarding or rebound.  Musculoskeletal:        General: No tenderness.     Cervical back: Normal range of motion and neck supple. No rigidity.  Lymphadenopathy:     Cervical: No cervical adenopathy.  Skin:    Findings: No erythema or rash.  Neurological:     Cranial Nerves: No cranial nerve deficit.     Motor: No abnormal muscle tone.     Coordination: Coordination normal.     Deep Tendon Reflexes: Reflexes normal.  Psychiatric:        Behavior: Behavior normal.        Thought Content: Thought content normal.        Judgment: Judgment normal.     Lab Results  Component Value Date   WBC 4.7 03/05/2022   HGB 14.6 03/05/2022   HCT 43.7 03/05/2022   PLT 230.0 03/05/2022   GLUCOSE 82 03/05/2022   CHOL 193 03/05/2022   TRIG 153.0 (H) 03/05/2022   HDL 55.40 03/05/2022   LDLDIRECT 142.6 07/09/2011   LDLCALC 107 (H) 03/05/2022   ALT 13 03/05/2022   AST 20 03/05/2022   NA 141 03/05/2022   K 4.4 03/05/2022   CL 106 03/05/2022   CREATININE 0.73 03/05/2022   BUN 13 03/05/2022   CO2 28 03/05/2022   TSH 1.19 05/28/2022   INR 0.9 10/16/2020   HGBA1C 5.6 10/12/2006    DG INJECT DIAG/THERA/INC NEEDLE/CATH/PLC EPI/LUMB/SAC W/IMG  Result Date: 06/30/2022 CLINICAL DATA:  Lumbosacral spondylosis without myelopathy. 85% relief following the initial epidural injection at L3-4 on 04/10/2022. Pain has recurred in the low back and left lower leg. Specific request for an injection at L4-5 today.  FLUOROSCOPY: Radiation Exposure Index (as provided by the fluoroscopic device): 2.80 mGy Kerma PROCEDURE: The procedure, risks, benefits, and alternatives were explained to the patient. Questions regarding the procedure were encouraged and answered. The patient understands and consents to the procedure. LUMBAR EPIDURAL INJECTION:  An interlaminar approach was performed on the left at L4-5. The overlying skin was cleansed and anesthetized. A 3.5 inch 20 gauge epidural needle was advanced using loss-of-resistance technique. DIAGNOSTIC EPIDURAL INJECTION Injection of Isovue-M 200 shows a good epidural pattern with spread above and below the level of needle placement primarily on the left. No vascular opacification is seen. THERAPEUTIC EPIDURAL INJECTION: 80 mg of Depo-Medrol mixed with 3 mL of 1% lidocaine were instilled. The procedure was well-tolerated, and the patient was discharged thirty minutes following the injection in good condition. COMPLICATIONS: None immediate IMPRESSION: Technically successful interlaminar epidural injection on the left at L4-5. Electronically Signed   By: Sebastian Ache M.D.   On: 06/30/2022 14:21    Assessment & Plan:   Problem List Items Addressed This Visit     Generalized anxiety disorder    Chronic Citalopram  Potential benefits of a long term SSRI use as well as potential risks  and complications were explained to the patient and were aknowledged.      Vitamin D deficiency   Menopausal syndrome (hot flashes) - Primary    Worse Options discussed No h/o HRT use Pt would like to use a patch  Potential benefits of a long term HRT  use as well as potential risks, ie breast cancer, DVT and other complications were explained to the patient and were aknowledged. Mammogram q 12 mo.          No orders of the defined types were placed in this encounter.     Follow-up: No follow-ups on file.  Ellen Primes, MD

## 2022-08-13 NOTE — Assessment & Plan Note (Addendum)
Worse Options discussed No h/o HRT use Pt would like to use a patch  Potential benefits of a long term HRT  use as well as potential risks, ie breast cancer, DVT and other complications were explained to the patient and were aknowledged. Mammogram q 12 mo.

## 2022-08-13 NOTE — Assessment & Plan Note (Signed)
Chronic °Citalopram ° Potential benefits of a long term SSRI use as well as potential risks  and complications were explained to the patient and were aknowledged. °

## 2022-08-21 ENCOUNTER — Encounter (INDEPENDENT_AMBULATORY_CARE_PROVIDER_SITE_OTHER): Payer: Self-pay

## 2022-08-26 ENCOUNTER — Ambulatory Visit: Payer: Medicare Other | Admitting: Family Medicine

## 2022-08-28 NOTE — Progress Notes (Signed)
[name] [address] [place] [address] Phone: [phone] Subjective:   INadine Counts, am serving as a scribe for Dr. Antoine Primas.  I'm seeing this patient by the request  of:  Plotnikov, Georgina Quint, MD  CC: knee and elbow pain follow up  [id number]  06/24/2022 No nerve impingement with moderate spinal stenosis at the L3-L4.  The patient's localization of the pain seems to be better but he is having radicular symptoms in the L4 distribution.  We discussed with patient at great length.  Encouraged her to do some different exercises.  Do feel repeating the epidural could be beneficial.  Patient did do significantly more activity than what you would normally do and we discussed maybe taking it slower.  Patient will follow-up with me again in 6 to 8 weeks after the epidural   Updated 08/29/2022 Ellen [name] is a 72 y.o. female coming in with complaint of knee and elbow pain. R knee pain might need injection. Started feeling it more after staying with kids and climbing stairs.       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   Procedure: LEFT TOTAL HIP ARTHROPLASTY ANTERIOR APPROACH;  Surgeon: Marcene Corning, MD;  Location: [facility];  Service: Orthopedics;  Laterality: Left;   Upper nl TSH w/sx     VAGINAL HYSTERECTOMY  1991   TVH, irregular bleeding   Social History   Socioeconomic History    Marital status: Married    Spouse name: Not on file   Number of children: 2   Years of education: Not on file   Highest education level: Associate degree: academic program  Occupational History   Occupation: Retired    Associate Professor: SELF EMPLOYED  Tobacco Use   Smoking status: Never   Smokeless tobacco: Never  Vaping Use   Vaping status: Never Used  Substance and Sexual Activity   Alcohol use: Yes    Comment: wine occ   Drug use: No   Sexual activity: Yes    Birth control/protection: Post-menopausal, Surgical    Comment: HYST-1st intercourse 72 yo-Fewer than 5 partners  Other Topics Concern   Not on file  Social History Narrative   Regular exercise   2 biol children-1 disabled with schizophrenia & 1 stepchild   Social Determinants of Health   Financial Resource Strain: Low Risk  (07/02/2022)   Overall Financial Resource Strain (CARDIA)    Difficulty of Paying Living Expenses: Not hard at all  Food Insecurity: No Food Insecurity (07/02/2022)   Hunger Vital Sign    Worried About Running Out of Food in the Last Year: Never true    Ran Out of Food in the Last Year: Never true  Transportation Needs: No Transportation Needs (07/02/2022)   PRAPARE - Administrator, Civil Service (Medical): No    Lack of Transportation (Non-Medical): No  Physical Activity: Insufficiently Active (07/02/2022)   Exercise Vital Sign    Days of Exercise per Week: 3 days    Minutes of Exercise per Session: 40 min  Stress: Stress Concern Present (07/02/2022)   [employer] of Occupational Health - Occupational Stress Questionnaire    Feeling of Stress : To some extent  Social Connections: Socially Integrated (07/02/2022)   Social Connection and Isolation Panel [NHANES]    Frequency of Communication with Friends and Family: More than three times a week    Frequency of Social Gatherings with Friends and Family: More than three times a week    Attends Religious Services: More than 4 times per  year    Active Member of [employer] or Organizations: Yes    Attends Engineer, structural: More than 4 times per year    Marital Status: Married   Allergies  Allergen Reactions   Belviq [Lorcaserin Hcl] Diarrhea   Demerol [Meperidine] Other (See Comments)    Palpitations. Many years ago.   Elavil [Amitriptyline Hcl] Other (See Comments)    constipation   Hydrocodone     Confusion, a very low BP after taking Oxycodone and Norco after hip surgery, would get very orthostatic, syncopal - Oct 2022 and Dec 2021  Can take Tramadol   Oxycodone Other (See Comments)    Confusion, a very low BP after taking Oxycodone and Norco after hip surgery, would get very orthostatic, syncopal - Oct 2022 and Dec 2021  Can take Tramadol   Peanut Oil Itching   Shellfish-Derived Products Itching   Latex Hives, Itching and Rash    Redness    Medical Adhesive Remover Hives, Itching and Rash   Family History  Problem Relation Age of Onset   Heart disease Father        CAD/CABG in his 68's   Hypertension Mother    Heart failure Mother     Current Outpatient Medications (Endocrine & Metabolic):    estradiol (CLIMARA - DOSED IN MG/24 HR) 0.025 mg/24hr patch, Place 1 patch (0.025 mg total) onto the skin once a week.   levothyroxine (SYNTHROID) 125 MCG tablet, Take 1 tablet by mouth once daily   Current Outpatient Medications (Respiratory):    fexofenadine (ALLEGRA) 180 MG tablet, Take 180 mg by mouth daily.   Current Outpatient Medications (Hematological):    Cyanocobalamin (B-12) 2500 MCG TABS, Take 2,500 mcg by mouth daily.  Current Outpatient Medications (Other):    Calcium Carb-Cholecalciferol (CALCIUM + D3 PO), Take by mouth. Calcium 600mg  & vit d3 2000 iu   citalopram (CELEXA) 20 MG tablet, Take 1 tablet by mouth once daily   CRANBERRY EXTRACT PO, Take 1 capsule by mouth daily.    D-Mannose 500 MG CAPS, Take 500 mg by mouth daily.   diphenoxylate-atropine (LOMOTIL) 2.5-0.025 MG tablet,  Take 1-2 tablets by mouth 4 (four) times daily as needed for diarrhea or loose stools.   hydrocortisone (ANUSOL-HC) 25 MG suppository, Place 1 suppository (25 mg total) rectally 2 (two) times daily as needed for hemorrhoids or anal itching.   Multiple Vitamin (MULTIVITAMIN) tablet, Take 1 tablet by mouth daily.   NON FORMULARY, Take 1 capsule by mouth at bedtime. Tart cherry extract   ondansetron (ZOFRAN) 4 MG tablet, Take 1 tablet (4 mg total) by mouth every 8 (eight) hours as needed for  nausea or vomiting.   pantoprazole (PROTONIX) 40 MG tablet, Take 40 mg by mouth daily.   Probiotic Product (PROBIOTIC PO), Take by mouth. 60 billion   traZODone (DESYREL) 50 MG tablet, TAKE 1 TABLET BY MOUTH AT BEDTIME   triamcinolone ointment (KENALOG) 0.5 %, Use qid prn   trimethoprim-polymyxin b (POLYTRIM) ophthalmic solution, Place 1 drop into the left eye every 6 (six) hours.   TURMERIC PO, Take 1 capsule by mouth daily.   UNABLE TO FIND, Med Name: nutrafol   Valerian Root 100 MG CAPS, Take 100 mg by mouth at bedtime.   Reviewed prior external information including notes and imaging from  primary care provider As well as notes that were available from care everywhere and other healthcare systems.  Past medical history, social, surgical and family history all reviewed in electronic medical record.  No pertanent information unless stated regarding to the chief complaint.   Review of Systems:  No headache, visual changes, nausea, vomiting, diarrhea, constipation, dizziness, abdominal pain, skin rash, fevers, chills, night sweats, weight loss, swollen lymph nodes, body aches, joint swelling, chest pain, shortness of breath, mood changes. POSITIVE muscle aches  Objective  Blood pressure 124/84, pulse 68, height 5\' 1"  (1.549 m), weight 141 lb (64 kg), SpO2 98%.   General: No apparent distress alert and oriented x3 mood and affect normal, dressed appropriately.  HEENT: Pupils equal, extraocular movements  intact  Respiratory: Patient's speak in full sentences and does not appear short of breath  Cardiovascular: No lower extremity edema, non tender, no erythema   Bilateral knees do have some crepitus noted.  Some lateral tracking of the patella noted.  Patient has no significant instability noted.  After informed written and verbal consent, patient was seated on exam table. Right knee was prepped with alcohol swab and utilizing anterolateral approach, patient's right knee space was injected with 4:1  marcaine 0.5%: Kenalog 40mg /dL. Patient tolerated the procedure well without immediate complications.  After informed written and verbal consent, patient was seated on exam table. Left knee was prepped with alcohol swab and utilizing anterolateral approach, patient's left knee space was injected with 4:1  marcaine 0.5%: Kenalog 40mg /dL. Patient tolerated the procedure well without immediate complications.    Impression and Recommendations:    The above documentation has been reviewed and is accurate and complete Judi Saa, DO

## 2022-08-29 ENCOUNTER — Ambulatory Visit: Payer: Medicare Other | Admitting: Family Medicine

## 2022-08-29 ENCOUNTER — Encounter: Payer: Self-pay | Admitting: Family Medicine

## 2022-08-29 VITALS — BP 124/84 | HR 68 | Ht 61.0 in | Wt 141.0 lb

## 2022-08-29 DIAGNOSIS — M170 Bilateral primary osteoarthritis of knee: Secondary | ICD-10-CM | POA: Insufficient documentation

## 2022-08-29 NOTE — Assessment & Plan Note (Signed)
Patient given injection and tolerated the procedure well, discussed icing regimen and home exercises, discussed which activities to do and which ones to avoid.  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  Could be a potential candidate for viscosupplementation at a later date.  Patient did well with her right sided knee injection greater than 3 years ago.  Hopeful that she will get the same benefit.  Follow-up with me again in 2 to 3 months

## 2022-08-29 NOTE — Patient Instructions (Signed)
See you again in 2-3 months

## 2022-09-08 ENCOUNTER — Encounter: Payer: Self-pay | Admitting: Internal Medicine

## 2022-09-15 ENCOUNTER — Other Ambulatory Visit: Payer: Self-pay | Admitting: Internal Medicine

## 2022-09-15 MED ORDER — AMOXICILLIN 500 MG PO CAPS: 500.0000 mg | ORAL_CAPSULE | Freq: Three times a day (TID) | ORAL | 1 refills | Status: DC

## 2022-10-06 ENCOUNTER — Encounter: Payer: Self-pay | Admitting: Internal Medicine

## 2022-10-06 ENCOUNTER — Ambulatory Visit: Payer: Medicare Other | Admitting: Internal Medicine

## 2022-10-06 VITALS — BP 120/80 | HR 67 | Temp 97.8°F | Ht 61.0 in | Wt 136.8 lb

## 2022-10-06 DIAGNOSIS — N390 Urinary tract infection, site not specified: Secondary | ICD-10-CM

## 2022-10-06 DIAGNOSIS — R030 Elevated blood-pressure reading, without diagnosis of hypertension: Secondary | ICD-10-CM | POA: Insufficient documentation

## 2022-10-06 DIAGNOSIS — E559 Vitamin D deficiency, unspecified: Secondary | ICD-10-CM

## 2022-10-06 DIAGNOSIS — Z23 Encounter for immunization: Secondary | ICD-10-CM

## 2022-10-06 DIAGNOSIS — F411 Generalized anxiety disorder: Secondary | ICD-10-CM | POA: Diagnosis not present

## 2022-10-06 DIAGNOSIS — E034 Atrophy of thyroid (acquired): Secondary | ICD-10-CM | POA: Diagnosis not present

## 2022-10-06 DIAGNOSIS — N951 Menopausal and female climacteric states: Secondary | ICD-10-CM

## 2022-10-06 MED ORDER — LEVOTHYROXINE SODIUM 125 MCG PO TABS: 125.0000 ug | ORAL_TABLET | Freq: Every day | ORAL | 3 refills | Status: DC

## 2022-10-06 MED ORDER — TRAZODONE HCL 50 MG PO TABS: 50.0000 mg | ORAL_TABLET | Freq: Every day | ORAL | 3 refills | Status: DC

## 2022-10-06 MED ORDER — CITALOPRAM HYDROBROMIDE 20 MG PO TABS: 20.0000 mg | ORAL_TABLET | Freq: Every day | ORAL | 3 refills | Status: DC

## 2022-10-06 MED ORDER — PANTOPRAZOLE SODIUM 40 MG PO TBEC: 40.0000 mg | DELAYED_RELEASE_TABLET | Freq: Every day | ORAL | 3 refills | Status: DC

## 2022-10-06 MED ORDER — ESTRADIOL 0.05 MG/24HR TD PTWK: 0.0500 mg | MEDICATED_PATCH | TRANSDERMAL | 6 refills | Status: DC

## 2022-10-06 NOTE — Assessment & Plan Note (Addendum)
Better a little Pt would like to try a patch at higher dose  Potential benefits of a long term HRT  use as well as potential risks, ie breast cancer, DVT and other complications were explained to the patient and were aknowledged. Mammogram q 12 mo.

## 2022-10-06 NOTE — Assessment & Plan Note (Signed)
No relapse 

## 2022-10-06 NOTE — Assessment & Plan Note (Signed)
Cont w/LEVOTHROID 112 MCG/D

## 2022-10-06 NOTE — Assessment & Plan Note (Signed)
Chronic °Citalopram ° Potential benefits of a long term SSRI use as well as potential risks  and complications were explained to the patient and were aknowledged. °

## 2022-10-06 NOTE — Progress Notes (Signed)
Subjective:  Patient ID: Ellen [name], female    DOB: 02/15/1950  Age: 72 y.o. MRN: [id number]  CC: Medical Management of Chronic Issues (3 month f/u /Patient wants to discuss the estrogen patch and side effects she's been having. )   HPI Kaylon L Caetano presents for hot flashes, urinary tract infections, anxiety  Outpatient Medications Prior to Visit  Medication Sig Dispense Refill   amoxicillin (AMOXIL) 500 MG capsule Take 1 capsule (500 mg total) by mouth 3 (three) times daily. 15 capsule 1   Calcium Carb-Cholecalciferol (CALCIUM + D3 PO) Take by mouth. Calcium 600mg  & vit d3 2000 iu     CRANBERRY EXTRACT PO Take 1 capsule by mouth daily.      Cyanocobalamin (B-12) 2500 MCG TABS Take 2,500 mcg by mouth daily.     D-Mannose 500 MG CAPS Take 500 mg by mouth daily.     diphenoxylate-atropine (LOMOTIL) 2.5-0.025 MG tablet Take 1-2 tablets by mouth 4 (four) times daily as needed for diarrhea or loose stools. 60 tablet 2   fexofenadine (ALLEGRA) 180 MG tablet Take 180 mg by mouth daily.     hydrocortisone (ANUSOL-HC) 25 MG suppository Place 1 suppository (25 mg total) rectally 2 (two) times daily as needed for hemorrhoids or anal itching. 20 suppository 1   Multiple Vitamin (MULTIVITAMIN) tablet Take 1 tablet by mouth daily.     NON FORMULARY Take 1 capsule by mouth at bedtime. Tart cherry extract     ondansetron (ZOFRAN) 4 MG tablet Take 1 tablet (4 mg total) by mouth every 8 (eight) hours as needed for nausea or vomiting. 20 tablet 0   Probiotic Product (PROBIOTIC PO) Take by mouth. 60 billion     triamcinolone ointment (KENALOG) 0.5 % Use qid prn 90 g 3   trimethoprim-polymyxin b (POLYTRIM) ophthalmic solution Place 1 drop into the left eye every 6 (six) hours. 10 mL 0   TURMERIC PO Take 1 capsule by mouth daily.     UNABLE TO FIND Med Name: nutrafol     Valerian Root 100 MG CAPS Take 100 mg by mouth at bedtime.     citalopram (CELEXA) 20 MG tablet Take 1 tablet by mouth once daily 90  tablet 3   estradiol (CLIMARA - DOSED IN MG/24 HR) 0.025 mg/24hr patch Place 1 patch (0.025 mg total) onto the skin once a week. (Patient taking differently: Place 0.025 mg onto the skin every 21 ( twenty-one) days.) 4 patch 5   levothyroxine (SYNTHROID) 125 MCG tablet Take 1 tablet by mouth once daily 90 tablet 2   pantoprazole (PROTONIX) 40 MG tablet Take 40 mg by mouth daily.     traZODone (DESYREL) 50 MG tablet TAKE 1 TABLET BY MOUTH AT BEDTIME 90 tablet 1   No facility-administered medications prior to visit.    ROS: Review of Systems  Constitutional:  Negative for activity change, appetite change, chills, fatigue and unexpected weight change.  HENT:  Negative for congestion, mouth sores and sinus pressure.   Eyes:  Negative for visual disturbance.  Respiratory:  Negative for cough and chest tightness.   Gastrointestinal:  Negative for abdominal pain and nausea.  Genitourinary:  Negative for difficulty urinating, frequency and vaginal pain.  Musculoskeletal:  Negative for back pain and gait problem.  Skin:  Negative for pallor and rash.  Neurological:  Negative for dizziness, tremors, weakness, numbness and headaches.  Psychiatric/Behavioral:  Negative for confusion and sleep disturbance.     Objective:  BP 120/80  Pulse 67   Temp 97.8 F (36.6 C) (Oral)   Ht 5\' 1"  (1.549 m)   Wt 136 lb 12.8 oz (62.1 kg)   SpO2 96%   BMI 25.85 kg/m   BP Readings from Last 3 Encounters:  10/06/22 120/80  08/29/22 124/84  08/13/22 118/60    Wt Readings from Last 3 Encounters:  10/16/22 133 lb (60.3 kg)  10/06/22 136 lb 12.8 oz (62.1 kg)  08/29/22 141 lb (64 kg)    Physical Exam Constitutional:      General: She is not in acute distress.    Appearance: She is well-developed.  HENT:     Head: Normocephalic.     Right Ear: External ear normal.     Left Ear: External ear normal.     Nose: Nose normal.  Eyes:     General:        Right eye: No discharge.        Left eye: No  discharge.     Conjunctiva/sclera: Conjunctivae normal.     Pupils: Pupils are equal, round, and reactive to light.  Neck:     Thyroid: No thyromegaly.     Vascular: No JVD.     Trachea: No tracheal deviation.  Cardiovascular:     Rate and Rhythm: Normal rate and regular rhythm.     Heart sounds: Normal heart sounds.  Pulmonary:     Effort: No respiratory distress.     Breath sounds: No stridor. No wheezing.  Abdominal:     General: Bowel sounds are normal. There is no distension.     Palpations: Abdomen is soft. There is no mass.     Tenderness: There is no abdominal tenderness. There is no guarding or rebound.  Musculoskeletal:        General: No tenderness.     Cervical back: Normal range of motion and neck supple. No rigidity.  Lymphadenopathy:     Cervical: No cervical adenopathy.  Skin:    Findings: No erythema or rash.  Neurological:     Cranial Nerves: No cranial nerve deficit.     Motor: No abnormal muscle tone.     Coordination: Coordination normal.     Deep Tendon Reflexes: Reflexes normal.  Psychiatric:        Behavior: Behavior normal.        Thought Content: Thought content normal.        Judgment: Judgment normal.     Lab Results  Component Value Date   WBC 4.7 03/05/2022   HGB 14.6 03/05/2022   HCT 43.7 03/05/2022   PLT 230.0 03/05/2022   GLUCOSE 82 03/05/2022   CHOL 193 03/05/2022   TRIG 153.0 (H) 03/05/2022   HDL 55.40 03/05/2022   LDLDIRECT 142.6 07/09/2011   LDLCALC 107 (H) 03/05/2022   ALT 13 03/05/2022   AST 20 03/05/2022   NA 141 03/05/2022   K 4.4 03/05/2022   CL 106 03/05/2022   CREATININE 0.73 03/05/2022   BUN 13 03/05/2022   CO2 28 03/05/2022   TSH 1.19 05/28/2022   INR 0.9 10/16/2020   HGBA1C 5.6 10/12/2006    DG INJECT DIAG/THERA/INC NEEDLE/CATH/PLC EPI/LUMB/SAC W/IMG  Result Date: 06/30/2022 CLINICAL DATA:  Lumbosacral spondylosis without myelopathy. 85% relief following the initial epidural injection at L3-4 on 04/10/2022.  Pain has recurred in the low back and left lower leg. Specific request for an injection at L4-5 today. FLUOROSCOPY: Radiation Exposure Index (as provided by the fluoroscopic device): 2.80 mGy Kerma PROCEDURE: The  procedure, risks, benefits, and alternatives were explained to the patient. Questions regarding the procedure were encouraged and answered. The patient understands and consents to the procedure. LUMBAR EPIDURAL INJECTION: An interlaminar approach was performed on the left at L4-5. The overlying skin was cleansed and anesthetized. A 3.5 inch 20 gauge epidural needle was advanced using loss-of-resistance technique. DIAGNOSTIC EPIDURAL INJECTION Injection of Isovue-M 200 shows a good epidural pattern with spread above and below the level of needle placement primarily on the left. No vascular opacification is seen. THERAPEUTIC EPIDURAL INJECTION: 80 mg of Depo-Medrol mixed with 3 mL of 1% lidocaine were instilled. The procedure was well-tolerated, and the patient was discharged thirty minutes following the injection in good condition. COMPLICATIONS: None immediate IMPRESSION: Technically successful interlaminar epidural injection on the left at L4-5. Electronically Signed   By: Sebastian Ache M.D.   On: 06/30/2022 14:21    Assessment & Plan:   Problem List Items Addressed This Visit     Generalized anxiety disorder    Chronic Citalopram  Potential benefits of a long term SSRI use as well as potential risks  and complications were explained to the patient and were aknowledged.      Relevant Medications   citalopram (CELEXA) 20 MG tablet   traZODone (DESYREL) 50 MG tablet   Hypothyroidism     Cont w/LEVOTHROID 112 MCG/D      Relevant Medications   levothyroxine (SYNTHROID) 125 MCG tablet   UTI (urinary tract infection)    No relapse      Vitamin D deficiency    On Vit D      Menopausal syndrome (hot flashes)    Better a little Pt would like to try a patch at higher dose  Potential  benefits of a long term HRT  use as well as potential risks, ie breast cancer, DVT and other complications were explained to the patient and were aknowledged. Mammogram q 12 mo.       Elevated BP without diagnosis of hypertension    Check BP at home. NAS diet      Other Visit Diagnoses     Need for immunization against influenza    -  Primary         Meds ordered this encounter  Medications   estradiol (CLIMARA) 0.05 mg/24hr patch    Sig: Place 1 patch (0.05 mg total) onto the skin once a week.    Dispense:  4 patch    Refill:  6   levothyroxine (SYNTHROID) 125 MCG tablet    Sig: Take 1 tablet (125 mcg total) by mouth daily.    Dispense:  90 tablet    Refill:  3   pantoprazole (PROTONIX) 40 MG tablet    Sig: Take 1 tablet (40 mg total) by mouth daily.    Dispense:  90 tablet    Refill:  3   citalopram (CELEXA) 20 MG tablet    Sig: Take 1 tablet (20 mg total) by mouth daily.    Dispense:  90 tablet    Refill:  3   traZODone (DESYREL) 50 MG tablet    Sig: Take 1 tablet (50 mg total) by mouth at bedtime.    Dispense:  90 tablet    Refill:  3      Follow-up: Return in about 3 months (around 01/05/2023) for a follow-up visit.  Sonda Primes, MD

## 2022-10-06 NOTE — Assessment & Plan Note (Signed)
On Vit D 

## 2022-10-06 NOTE — Assessment & Plan Note (Addendum)
Check BP at home NAS diet

## 2022-10-16 ENCOUNTER — Ambulatory Visit (INDEPENDENT_AMBULATORY_CARE_PROVIDER_SITE_OTHER): Payer: Medicare Other | Admitting: Audiology

## 2022-10-16 ENCOUNTER — Encounter (INDEPENDENT_AMBULATORY_CARE_PROVIDER_SITE_OTHER): Payer: Self-pay

## 2022-10-16 ENCOUNTER — Ambulatory Visit (INDEPENDENT_AMBULATORY_CARE_PROVIDER_SITE_OTHER): Payer: Medicare Other | Admitting: Otolaryngology

## 2022-10-16 VITALS — Ht 62.0 in | Wt 133.0 lb

## 2022-10-16 DIAGNOSIS — H9041 Sensorineural hearing loss, unilateral, right ear, with unrestricted hearing on the contralateral side: Secondary | ICD-10-CM | POA: Diagnosis not present

## 2022-10-16 DIAGNOSIS — H903 Sensorineural hearing loss, bilateral: Secondary | ICD-10-CM | POA: Diagnosis not present

## 2022-10-16 DIAGNOSIS — H9313 Tinnitus, bilateral: Secondary | ICD-10-CM | POA: Diagnosis not present

## 2022-10-16 NOTE — Progress Notes (Signed)
[facility] [address], [address] [place], [place] [address]   Audiological Evaluation    History: Iyonna Rish was referred today for a hearing evaluation by Dr. Janeece Riggers Philomena Doheny. She was previously seen at [facility] for an audiological evaluation on March 24th, 2022.   Tympanogram: Right ear: Normal external ear canal volume with normal middle ear pressure and tympanic membrane compliance (Type A). Left ear: Normal external ear canal volume with normal middle ear pressure and tympanic membrane compliance (Type A).   Hearing Evaluation: The audiogram was completed using conventional audiometric techniques under headphones with good reliability.   The hearing test results indicate: Right ear: Normal hearing sensitivity from [phone] Hz sloping to moderate sensorineural hearing loss from 4000-8000 Hz. Left ear: Normal hearing sensitivity from [phone] Hz.   Speech Recognition Thresholds were obtained at 15 dBHL in the right ear and 15 dBHL in the left ear.   Word Recognition Testing was completed using the NU-6 word lists at 55 dBHL in the right ear and at 55 dBHL in the left ear and the patient scored 100% in the right ear and 100% in the left ear.   Recommendations: Repeat audiogram when changes are perceived or per MD.   Conley Rolls Alixander Rallis, AUD, CCC-A 10/16/2022

## 2022-10-18 DIAGNOSIS — H903 Sensorineural hearing loss, bilateral: Secondary | ICD-10-CM | POA: Insufficient documentation

## 2022-10-18 DIAGNOSIS — H9313 Tinnitus, bilateral: Secondary | ICD-10-CM | POA: Insufficient documentation

## 2022-10-18 NOTE — Progress Notes (Signed)
Patient ID: Ellen [name], female   DOB: 1950/01/18, 72 y.o.   MRN: [id number]  Follow up: Hearing loss, tinnitus  HPI: The patient is a 72 year old female who returns today for her follow-up evaluation.  The patient was previously seen for hearing loss.  At her last visit in 2022, she was noted to have borderline normal hearing bilaterally across all frequencies.  No acute intervention was offered at that time.  The patient returns today complaining of increasing hearing difficulty.  She is having difficulty in noisy environments.  In addition, she also complains of bilateral roaring tinnitus.  She denies any otalgia, otorrhea, or vertigo.  Exam: General: Communicates without difficulty, well nourished, no acute distress. Head: Normocephalic, no evidence injury, no tenderness, facial buttresses intact without stepoff. Face/sinus: No tenderness to palpation and percussion. Facial movement is normal and symmetric. Eyes: PERRL, EOMI. No scleral icterus, conjunctivae clear. Neuro: CN II exam reveals vision grossly intact.  No nystagmus at any point of gaze. Ears: Auricles well formed without lesions.  Ear canals are intact without mass or lesion.  No erythema or edema is appreciated.  The TMs are intact without fluid. Nose: External evaluation reveals normal support and skin without lesions.  Dorsum is intact.  Anterior rhinoscopy reveals congested mucosa over anterior aspect of inferior turbinates and intact septum.  No purulence noted. Oral:  Oral cavity and oropharynx are intact, symmetric, without erythema or edema.  Mucosa is moist without lesions. Neck: Full range of motion without pain.  There is no significant lymphadenopathy.  No masses palpable.  Thyroid bed within normal limits to palpation.  Parotid glands and submandibular glands equal bilaterally without mass.  Trachea is midline. Neuro:  CN 2-12 grossly intact.    Audiometric evaluation shows bilateral mild high-frequency sensorineural hearing  loss.  Assessment: 1.  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 2.  The patient's tinnitus is likely a result of the hearing loss.  Plan: 1.  The physical exam findings and the hearing test results are reviewed with the patient. 2.  The hearing amplification options are discussed. 3.  The patient will return for reevaluation in 1 year.

## 2022-10-20 DIAGNOSIS — H04123 Dry eye syndrome of bilateral lacrimal glands: Secondary | ICD-10-CM | POA: Diagnosis not present

## 2022-10-20 DIAGNOSIS — Z961 Presence of intraocular lens: Secondary | ICD-10-CM | POA: Diagnosis not present

## 2022-10-21 ENCOUNTER — Other Ambulatory Visit: Payer: Self-pay | Admitting: Internal Medicine

## 2022-11-13 NOTE — Progress Notes (Signed)
[name] [address] [place] [address] Phone: [phone] Subjective:   Ellen [name], am serving as a scribe for Dr. Antoine Primas.  I'm seeing this patient by the request  of:  Plotnikov, Georgina Quint, MD  CC: Bilateral knee pain   EPP:[id number]  08/29/2022 Patient given injection and tolerated the procedure well, discussed icing regimen and home exercises, discussed which activities to do and which ones to avoid. 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 Could be a potential candidate for viscosupplementation at a later date. Patient did well with her right sided knee injection greater than 3 years ago. Hopeful that she will get the same benefit. Follow-up with me again in 2 to 3 months   Updated 11/14/2022 Ellen [name] is a 72 y.o. female coming in with complaint of B knee pain. Was able to ride in [place] event. L knee is good. R knee feels like knee cap moves laterally. Neck on ht eL side has been a bit bother some.       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   Procedure: LEFT TOTAL HIP ARTHROPLASTY ANTERIOR APPROACH;  Surgeon: Marcene Corning, MD;  Location: [facility];  Service: Orthopedics;  Laterality: Left;   Upper nl TSH w/sx     VAGINAL HYSTERECTOMY  1991   TVH, irregular bleeding   Social History   Socioeconomic History   Marital status: Married    Spouse name: Not on file   Number  of children: 2   Years of education: Not on file   Highest education level: Associate degree: academic program  Occupational History   Occupation: Retired    Associate Professor: SELF EMPLOYED  Tobacco Use   Smoking status: Never   Smokeless tobacco: Never  Vaping Use   Vaping status: Never Used  Substance and Sexual Activity   Alcohol use: Yes    Comment: wine occ   Drug use: No   Sexual activity: Yes    Birth control/protection: Post-menopausal, Surgical    Comment: HYST-1st intercourse 72 yo-Fewer than 5 partners  Other Topics Concern   Not on file  Social History Narrative   Regular exercise   2 biol children-1 disabled with schizophrenia & 1 stepchild   Social Determinants of Health   Financial Resource Strain: Low Risk  (07/02/2022)   Overall Financial Resource Strain (CARDIA)    Difficulty of Paying Living Expenses: Not hard at all  Food Insecurity: No Food Insecurity (07/02/2022)   Hunger Vital Sign    Worried About Running Out of Food in the Last Year: Never true    Ran Out of Food in the Last Year: Never true  Transportation Needs: No Transportation Needs (07/02/2022)  PRAPARE - Administrator, Civil Service (Medical): No    Lack of Transportation (Non-Medical): No  Physical Activity: Insufficiently Active (07/02/2022)   Exercise Vital Sign    Days of Exercise per Week: 3 days    Minutes of Exercise per Session: 40 min  Stress: Stress Concern Present (07/02/2022)   [employer] of Occupational Health - Occupational Stress Questionnaire    Feeling of Stress : To some extent  Social Connections: Socially Integrated (07/02/2022)   Social Connection and Isolation Panel [NHANES]    Frequency of Communication with Friends and Family: More than three times a week    Frequency of Social Gatherings with Friends and Family: More than three times a week    Attends Religious Services: More than 4 times per year    Active Member of [employer] or Organizations: Yes    Attends  Engineer, structural: More than 4 times per year    Marital Status: Married   Allergies  Allergen Reactions   Belviq [Lorcaserin Hcl] Diarrhea   Demerol [Meperidine] Other (See Comments)    Palpitations. Many years ago.   Elavil [Amitriptyline Hcl] Other (See Comments)    constipation   Hydrocodone     Confusion, a very low BP after taking Oxycodone and Norco after hip surgery, would get very orthostatic, syncopal - Oct 2022 and Dec 2021  Can take Tramadol   Oxycodone Other (See Comments)    Confusion, a very low BP after taking Oxycodone and Norco after hip surgery, would get very orthostatic, syncopal - Oct 2022 and Dec 2021  Can take Tramadol   Peanut Oil Itching   Shellfish-Derived Products Itching   Latex Hives, Itching and Rash    Redness    Medical Adhesive Remover Hives, Itching and Rash   Family History  Problem Relation Age of Onset   Heart disease Father        CAD/CABG in his 56's   Hypertension Mother    Heart failure Mother     Current Outpatient Medications (Endocrine & Metabolic):    estradiol (CLIMARA) 0.05 mg/24hr patch, Place 1 patch (0.05 mg total) onto the skin once a week.   levothyroxine (SYNTHROID) 125 MCG tablet, Take 1 tablet (125 mcg total) by mouth daily.   Current Outpatient Medications (Respiratory):    fexofenadine (ALLEGRA) 180 MG tablet, Take 180 mg by mouth daily.   Current Outpatient Medications (Hematological):    Cyanocobalamin (B-12) 2500 MCG TABS, Take 2,500 mcg by mouth daily.  Current Outpatient Medications (Other):    amoxicillin (AMOXIL) 500 MG capsule, Take 1 capsule (500 mg total) by mouth 3 (three) times daily.   Calcium Carb-Cholecalciferol (CALCIUM + D3 PO), Take by mouth. Calcium 600mg  & vit d3 2000 iu   citalopram (CELEXA) 20 MG tablet, Take 1 tablet (20 mg total) by mouth daily.   CRANBERRY EXTRACT PO, Take 1 capsule by mouth daily.    D-Mannose 500 MG CAPS, Take 500 mg by mouth daily.    diphenoxylate-atropine (LOMOTIL) 2.5-0.025 MG tablet, Take 1-2 tablets by mouth 4 (four) times daily as needed for diarrhea or loose stools.   hydrocortisone (ANUSOL-HC) 25 MG suppository, Place 1 suppository (25 mg total) rectally 2 (two) times daily as needed for hemorrhoids or anal itching.   Multiple Vitamin (MULTIVITAMIN) tablet, Take 1 tablet by mouth daily.   NON FORMULARY, Take 1 capsule by mouth at bedtime. Tart cherry extract   ondansetron (ZOFRAN) 4 MG tablet, Take 1 tablet (4 mg  total) by mouth every 8 (eight) hours as needed for nausea or vomiting.   pantoprazole (PROTONIX) 40 MG tablet, Take 1 tablet (40 mg total) by mouth daily.   Probiotic Product (PROBIOTIC PO), Take by mouth. 60 billion   traZODone (DESYREL) 50 MG tablet, Take 1 tablet (50 mg total) by mouth at bedtime.   triamcinolone ointment (KENALOG) 0.5 %, Use qid prn   trimethoprim-polymyxin b (POLYTRIM) ophthalmic solution, Place 1 drop into the left eye every 6 (six) hours.   TURMERIC PO, Take 1 capsule by mouth daily.   UNABLE TO FIND, Med Name: nutrafol   Valerian Root 100 MG CAPS, Take 100 mg by mouth at bedtime.   Reviewed prior external information including notes and imaging from  primary care provider As well as notes that were available from care everywhere and other healthcare systems.  Past medical history, social, surgical and family history all reviewed in electronic medical record.  No pertanent information unless stated regarding to the chief complaint.   Review of Systems:  No headache, visual changes, nausea, vomiting, diarrhea, constipation, dizziness, abdominal pain, skin rash, fevers, chills, night sweats, weight loss, swollen lymph nodes, body aches, joint swelling, chest pain, shortness of breath, mood changes. POSITIVE muscle aches  Objective  Blood pressure 120/82, pulse 76, height 5\' 2"  (1.575 m), weight 135 lb (61.2 kg), SpO2 95%.   General: No apparent distress alert and oriented x3 mood  and affect normal, dressed appropriately.  HEENT: Pupils equal, extraocular movements intact  Respiratory: Patient's speak in full sentences and does not appear short of breath  Cardiovascular: No lower extremity edema, non tender, no erythema  Bilateral knee exam shows patient does have crepitus noted of the knees bilaterally.  Does have some instability with valgus and varus force.  No significant effusion noted  Patient does have significant tightness noted in the neck.  Seems to have more of a spasm of the left trapezius with multiple trigger points.    Impression and Recommendations:    The above documentation has been reviewed and is accurate and complete Judi Saa, DO

## 2022-11-14 ENCOUNTER — Ambulatory Visit: Payer: Medicare Other | Admitting: Family Medicine

## 2022-11-14 ENCOUNTER — Encounter: Payer: Self-pay | Admitting: Family Medicine

## 2022-11-14 VITALS — BP 120/82 | HR 76 | Ht 62.0 in | Wt 135.0 lb

## 2022-11-14 DIAGNOSIS — M25512 Pain in left shoulder: Secondary | ICD-10-CM | POA: Insufficient documentation

## 2022-11-14 DIAGNOSIS — M2550 Pain in unspecified joint: Secondary | ICD-10-CM | POA: Insufficient documentation

## 2022-11-14 DIAGNOSIS — M170 Bilateral primary osteoarthritis of knee: Secondary | ICD-10-CM

## 2022-11-14 NOTE — Patient Instructions (Addendum)
Good to see you! Do prescribed exercises at least 3x a week 12MG  Soothe Magnesium Cream See you again in 3 months

## 2022-11-14 NOTE — Assessment & Plan Note (Signed)
Known arthritic changes but seems to be stable at this point.  We discussed with patient about potentially doing viscosupplementation as well or PRP.  Increase activity as tolerated.  Discussed which activities to do and which ones to avoid.  Continue to stay active and work on [employer].  Follow-up with me again 2 to 3 months.

## 2022-11-14 NOTE — Assessment & Plan Note (Signed)
Trigger points noted in the left trapezius area.  Discussed the possibility of injection but patient felt like she was making some improvement.  Home exercises given today, discussed icing regimen.  Discussed which activities to do and which ones to avoid.  Patient knows of worsening pain to will come see [place] or seek medical attention if acute.  No sign of any infectious etiology or any shortness of breath.  Differential discussed with patient does include cervical radiculopathy and can get x-rays of worsening symptoms.  Follow-up again in 2 to 3 months

## 2022-11-25 ENCOUNTER — Encounter: Payer: Self-pay | Admitting: Internal Medicine

## 2022-11-30 ENCOUNTER — Other Ambulatory Visit: Payer: Self-pay | Admitting: Internal Medicine

## 2022-11-30 MED ORDER — AMOXICILLIN 500 MG PO CAPS: 500.0000 mg | ORAL_CAPSULE | Freq: Three times a day (TID) | ORAL | 0 refills | Status: DC

## 2022-12-03 DIAGNOSIS — L720 Epidermal cyst: Secondary | ICD-10-CM | POA: Diagnosis not present

## 2022-12-03 DIAGNOSIS — S60561A Insect bite (nonvenomous) of right hand, initial encounter: Secondary | ICD-10-CM | POA: Diagnosis not present

## 2023-01-21 DIAGNOSIS — L570 Actinic keratosis: Secondary | ICD-10-CM | POA: Diagnosis not present

## 2023-01-21 DIAGNOSIS — L308 Other specified dermatitis: Secondary | ICD-10-CM | POA: Diagnosis not present

## 2023-01-21 DIAGNOSIS — L814 Other melanin hyperpigmentation: Secondary | ICD-10-CM | POA: Diagnosis not present

## 2023-01-21 DIAGNOSIS — L218 Other seborrheic dermatitis: Secondary | ICD-10-CM | POA: Diagnosis not present

## 2023-01-21 DIAGNOSIS — D2272 Melanocytic nevi of left lower limb, including hip: Secondary | ICD-10-CM | POA: Diagnosis not present

## 2023-01-21 DIAGNOSIS — L821 Other seborrheic keratosis: Secondary | ICD-10-CM | POA: Diagnosis not present

## 2023-01-21 DIAGNOSIS — D485 Neoplasm of uncertain behavior of skin: Secondary | ICD-10-CM | POA: Diagnosis not present

## 2023-02-03 ENCOUNTER — Ambulatory Visit: Payer: Medicare Other | Admitting: Family Medicine

## 2023-02-03 ENCOUNTER — Other Ambulatory Visit: Payer: Self-pay

## 2023-02-03 ENCOUNTER — Ambulatory Visit (INDEPENDENT_AMBULATORY_CARE_PROVIDER_SITE_OTHER): Payer: Medicare Other

## 2023-02-03 VITALS — BP 124/80 | HR 76 | Ht 62.0 in

## 2023-02-03 DIAGNOSIS — M2550 Pain in unspecified joint: Secondary | ICD-10-CM | POA: Diagnosis not present

## 2023-02-03 DIAGNOSIS — N390 Urinary tract infection, site not specified: Secondary | ICD-10-CM | POA: Diagnosis not present

## 2023-02-03 DIAGNOSIS — M25562 Pain in left knee: Secondary | ICD-10-CM | POA: Diagnosis not present

## 2023-02-03 DIAGNOSIS — M1711 Unilateral primary osteoarthritis, right knee: Secondary | ICD-10-CM | POA: Diagnosis not present

## 2023-02-03 DIAGNOSIS — M1712 Unilateral primary osteoarthritis, left knee: Secondary | ICD-10-CM | POA: Diagnosis not present

## 2023-02-03 DIAGNOSIS — M25461 Effusion, right knee: Secondary | ICD-10-CM | POA: Diagnosis not present

## 2023-02-03 DIAGNOSIS — M170 Bilateral primary osteoarthritis of knee: Secondary | ICD-10-CM

## 2023-02-03 DIAGNOSIS — E559 Vitamin D deficiency, unspecified: Secondary | ICD-10-CM | POA: Diagnosis not present

## 2023-02-03 DIAGNOSIS — M25561 Pain in right knee: Secondary | ICD-10-CM | POA: Diagnosis not present

## 2023-02-03 LAB — CBC WITH DIFFERENTIAL/PLATELET
Basophils Absolute: 0.1 10*3/uL (ref 0.0–0.1)
Basophils Relative: 1.2 % (ref 0.0–3.0)
Eosinophils Absolute: 0.4 10*3/uL (ref 0.0–0.7)
Eosinophils Relative: 6.4 % — ABNORMAL HIGH (ref 0.0–5.0)
HCT: 42.4 % (ref 36.0–46.0)
Hemoglobin: 14.1 g/dL (ref 12.0–15.0)
Lymphocytes Relative: 33.5 % (ref 12.0–46.0)
Lymphs Abs: 2.3 10*3/uL (ref 0.7–4.0)
MCHC: 33.2 g/dL (ref 30.0–36.0)
MCV: 98.8 fL (ref 78.0–100.0)
Monocytes Absolute: 0.6 10*3/uL (ref 0.1–1.0)
Monocytes Relative: 9.6 % (ref 3.0–12.0)
Neutro Abs: 3.3 10*3/uL (ref 1.4–7.7)
Neutrophils Relative %: 49.3 % (ref 43.0–77.0)
Platelets: 218 10*3/uL (ref 150.0–400.0)
RBC: 4.29 Mil/uL (ref 3.87–5.11)
RDW: 12.3 % (ref 11.5–15.5)
WBC: 6.7 10*3/uL (ref 4.0–10.5)

## 2023-02-03 LAB — URINALYSIS
Bilirubin Urine: NEGATIVE
Hgb urine dipstick: NEGATIVE
Leukocytes,Ua: NEGATIVE
Nitrite: NEGATIVE
Specific Gravity, Urine: 1.015 (ref 1.000–1.030)
Total Protein, Urine: NEGATIVE
Urine Glucose: NEGATIVE
Urobilinogen, UA: 0.2 (ref 0.0–1.0)
pH: 7 (ref 5.0–8.0)

## 2023-02-03 LAB — COMPREHENSIVE METABOLIC PANEL
ALT: 15 U/L (ref 0–35)
AST: 26 U/L (ref 0–37)
Albumin: 4.3 g/dL (ref 3.5–5.2)
Alkaline Phosphatase: 68 U/L (ref 39–117)
BUN: 9 mg/dL (ref 6–23)
CO2: 24 meq/L (ref 19–32)
Calcium: 8.9 mg/dL (ref 8.4–10.5)
Chloride: 106 meq/L (ref 96–112)
Creatinine, Ser: 0.69 mg/dL (ref 0.40–1.20)
GFR: 85.52 mL/min (ref >60.00)
Glucose, Bld: 82 mg/dL (ref 70–99)
Potassium: 4.6 meq/L (ref 3.5–5.1)
Sodium: 137 meq/L (ref 135–145)
Total Bilirubin: 0.3 mg/dL (ref 0.2–1.2)
Total Protein: 6.7 g/dL (ref 6.0–8.3)

## 2023-02-03 LAB — IBC PANEL
Iron: 83 ug/dL (ref 42–145)
Saturation Ratios: 26.7 % (ref 20.0–50.0)
TIBC: 310.8 ug/dL (ref 250.0–450.0)
Transferrin: 222 mg/dL (ref 212.0–360.0)

## 2023-02-03 LAB — VITAMIN D 25 HYDROXY (VIT D DEFICIENCY, FRACTURES): VITD: 74.5 ng/mL (ref 30.00–100.00)

## 2023-02-03 LAB — FERRITIN: Ferritin: 34.7 ng/mL (ref 10.0–291.0)

## 2023-02-03 LAB — VITAMIN B12: Vitamin B-12: 107 pg/mL — ABNORMAL LOW (ref 211–911)

## 2023-02-03 NOTE — Assessment & Plan Note (Signed)
Recheck vitamin D with patient having a questionable tibial plateau injury

## 2023-02-03 NOTE — Patient Instructions (Addendum)
Labs today See you again in 3 weeks

## 2023-02-03 NOTE — Assessment & Plan Note (Signed)
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  There is a cortical irregularity zone noted of the tibial plateau on the right side that we will need to monitor.  No significant swelling noted on exam but is fairly tender.  Able to do activities of daily living and weight-bear as tolerated.  Will recheck vitamin D levels and with patient looking somewhat pale we will further evaluate for any other type of abnormality that could be contributing.  Discussed with patient about icing regimen and home exercises.  Discussed with patient that I do think we need to see her again in 3 weeks to make sure she is making improvement.  Patient is in agreement with the plan.  Follow-up again in 3 weeks

## 2023-02-03 NOTE — Progress Notes (Signed)
[name] [address] [place] [address] Phone: [phone] Subjective:   Bruce Donath, am serving as a scribe for Dr. Antoine Primas.  I'm seeing this patient by the request  of:  Plotnikov, Georgina Quint, MD  CC: Bilateral knee pain after fall  [id number]  11/14/2022 Known arthritic changes but seems to be stable at this point.  We discussed with patient about potentially doing viscosupplementation as well or PRP.  Increase activity as tolerated.  Discussed which activities to do and which ones to avoid.  Continue to stay active and work on [employer].  Follow-up with me again 2 to 3 months.     Trigger points noted in the left trapezius area. Discussed the possibility of injection but patient felt like she was making some improvement. Home exercises given today, discussed icing regimen. Discussed which activities to do and which ones to avoid. Patient knows of worsening pain to will come see [place] or seek medical attention if acute. No sign of any infectious etiology or any shortness of breath. Differential discussed with patient does include cervical radiculopathy and can get x-rays of worsening symptoms. Follow-up again in 2 to 3 months   Update 02/03/2023 Ellen [name] is a 73 y.o. female coming in with complaint of B knee pain. 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/16/2020   Procedure: LEFT TOTAL HIP ARTHROPLASTY ANTERIOR APPROACH;  Surgeon: Marcene Corning, MD;  Location: [facility];  Service: Orthopedics;  Laterality: Left;   Upper nl TSH w/sx     VAGINAL HYSTERECTOMY  1991   TVH, irregular bleeding   Social History   Socioeconomic History   Marital status: Married    Spouse name: Not on file   Number of children: 2   Years of education: Not on file   Highest education level: Associate degree: academic program  Occupational History   Occupation: Retired    Associate Professor: SELF EMPLOYED  Tobacco Use   Smoking status: Never   Smokeless tobacco: Never  Vaping Use   Vaping status: Never Used  Substance and Sexual Activity   Alcohol use: Yes    Comment: wine occ   Drug use: No   Sexual activity: Yes    Birth control/protection: Post-menopausal, Surgical    Comment: HYST-1st intercourse 73 yo-Fewer than 5 partners  Other Topics Concern   Not on file  Social History Narrative   Regular exercise   2 biol children-1 disabled with schizophrenia & 1 stepchild   Social Drivers of Corporate investment banker  Strain: Low Risk  (07/02/2022)   Overall Financial Resource Strain (CARDIA)    Difficulty of Paying Living Expenses: Not hard at all  Food Insecurity: No Food Insecurity (07/02/2022)   Hunger Vital Sign    Worried About Running Out of Food in the Last Year: Never true    Ran Out of Food in the Last Year: Never true  Transportation Needs: No Transportation Needs (07/02/2022)   PRAPARE - Administrator, Civil Service (Medical): No    Lack of Transportation (Non-Medical): No  Physical Activity: Insufficiently Active (07/02/2022)   Exercise Vital Sign    Days of Exercise per Week: 3 days    Minutes of Exercise per Session: 40 min  Stress: Stress Concern Present (07/02/2022)   [employer] of Occupational Health - Occupational Stress  Questionnaire    Feeling of Stress : To some extent  Social Connections: Socially Integrated (07/02/2022)   Social Connection and Isolation Panel [NHANES]    Frequency of Communication with Friends and Family: More than three times a week    Frequency of Social Gatherings with Friends and Family: More than three times a week    Attends Religious Services: More than 4 times per year    Active Member of [employer] or Organizations: Yes    Attends Engineer, structural: More than 4 times per year    Marital Status: Married   Allergies  Allergen Reactions   Belviq [Lorcaserin Hcl] Diarrhea   Demerol [Meperidine] Other (See Comments)    Palpitations. Many years ago.   Elavil [Amitriptyline Hcl] Other (See Comments)    constipation   Hydrocodone     Confusion, a very low BP after taking Oxycodone and Norco after hip surgery, would get very orthostatic, syncopal - Oct 2022 and Dec 2021  Can take Tramadol   Oxycodone Other (See Comments)    Confusion, a very low BP after taking Oxycodone and Norco after hip surgery, would get very orthostatic, syncopal - Oct 2022 and Dec 2021  Can take Tramadol   Peanut Oil Itching   Shellfish-Derived Products Itching   Latex Hives, Itching and Rash    Redness    Medical Adhesive Remover Hives, Itching and Rash   Family History  Problem Relation Age of Onset   Heart disease Father        CAD/CABG in his 7's   Hypertension Mother    Heart failure Mother     Current Outpatient Medications (Endocrine & Metabolic):    estradiol (CLIMARA) 0.05 mg/24hr patch, Place 1 patch (0.05 mg total) onto the skin once a week.   levothyroxine (SYNTHROID) 125 MCG tablet, Take 1 tablet (125 mcg total) by mouth daily.   Current Outpatient Medications (Respiratory):    fexofenadine (ALLEGRA) 180 MG tablet, Take 180 mg by mouth daily.   Current Outpatient Medications (Hematological):    Cyanocobalamin (B-12) 2500 MCG TABS, Take 2,500 mcg by mouth  daily.  Current Outpatient Medications (Other):    amoxicillin (AMOXIL) 500 MG capsule, Take 1 capsule (500 mg total) by mouth 3 (three) times daily.   Calcium Carb-Cholecalciferol (CALCIUM + D3 PO), Take by mouth. Calcium 600mg  & vit d3 2000 iu   citalopram (CELEXA) 20 MG tablet, Take 1 tablet (20 mg total) by mouth daily.   CRANBERRY EXTRACT PO, Take 1 capsule by mouth daily.    D-Mannose 500 MG CAPS, Take 500 mg by mouth daily.   diphenoxylate-atropine (LOMOTIL) 2.5-0.025 MG tablet, Take 1-2 tablets by mouth 4 (  four) times daily as needed for diarrhea or loose stools.   hydrocortisone (ANUSOL-HC) 25 MG suppository, Place 1 suppository (25 mg total) rectally 2 (two) times daily as needed for hemorrhoids or anal itching.   Multiple Vitamin (MULTIVITAMIN) tablet, Take 1 tablet by mouth daily.   NON FORMULARY, Take 1 capsule by mouth at bedtime. Tart cherry extract   ondansetron (ZOFRAN) 4 MG tablet, Take 1 tablet (4 mg total) by mouth every 8 (eight) hours as needed for nausea or vomiting.   pantoprazole (PROTONIX) 40 MG tablet, Take 1 tablet (40 mg total) by mouth daily.   Probiotic Product (PROBIOTIC PO), Take by mouth. 60 billion   traZODone (DESYREL) 50 MG tablet, Take 1 tablet (50 mg total) by mouth at bedtime.   triamcinolone ointment (KENALOG) 0.5 %, Use qid prn   trimethoprim-polymyxin b (POLYTRIM) ophthalmic solution, Place 1 drop into the left eye every 6 (six) hours.   TURMERIC PO, Take 1 capsule by mouth daily.   UNABLE TO FIND, Med Name: nutrafol   Valerian Root 100 MG CAPS, Take 100 mg by mouth at bedtime.   Reviewed prior external information including notes and imaging from  primary care provider As well as notes that were available from care everywhere and other healthcare systems.  Past medical history, social, surgical and family history all reviewed in electronic medical record.  No pertanent information unless stated regarding to the chief complaint.   Review of  Systems:  No headache, visual changes, nausea, vomiting, diarrhea, constipation, dizziness, abdominal pain, skin rash, fevers, chills, night sweats, weight loss, swollen lymph nodes,  chest pain, shortness of breath, mood changes. POSITIVE muscle aches, body aches joint swelling  Objective  Blood pressure 124/80, pulse 76, height 5\' 2"  (1.575 m), SpO2 98%.   General: No apparent distress alert and oriented x3 mood and affect normal, dressed appropriately.  Patient does appear pale HEENT: Pupils equal, extraocular movements intact  Respiratory: Patient's speak in full sentences and does not appear short of breath  Cardiovascular: No lower extremity edema, non tender, no erythema  Patient has ambulated and gingerly.  Some mild swelling noted over the right knee.  Mild abrasion noted over the right patella.  Limited muscular skeletal ultrasound was performed and interpreted by Antoine Primas, M   Ultrasound shows that there is nonspecific hypoechoic changes in the patellofemoral joint.Does have a mild cortical irregularity noted of the tibial plateau from on the anterior medial aspect. Impression: Nonspecific traumatic fall with a irregularity of the tibial plateau.    Impression and Recommendations:    The above documentation has been reviewed and is accurate and complete Judi Saa, DO

## 2023-02-03 NOTE — Assessment & Plan Note (Signed)
History of UTIs and will recheck with patient having a paleness to her today.

## 2023-02-04 ENCOUNTER — Encounter: Payer: Self-pay | Admitting: Family Medicine

## 2023-02-04 ENCOUNTER — Telehealth: Payer: Self-pay | Admitting: Family Medicine

## 2023-02-04 LAB — SEDIMENTATION RATE: Sed Rate: 11 mm/h (ref 0–30)

## 2023-02-04 NOTE — Telephone Encounter (Signed)
Pt very anxious to get her lab results and instructions that Dr. Katrinka Blazing said he would give her. Labs are pending BUT MyChart has been down since yesterday.  Can we call her with lab results/further instructions please.

## 2023-02-05 ENCOUNTER — Ambulatory Visit: Payer: Medicare Other

## 2023-02-05 ENCOUNTER — Encounter: Payer: Self-pay | Admitting: Family Medicine

## 2023-02-05 DIAGNOSIS — E538 Deficiency of other specified B group vitamins: Secondary | ICD-10-CM | POA: Diagnosis not present

## 2023-02-05 MED ORDER — CYANOCOBALAMIN 1000 MCG/ML IJ SOLN
1000.0000 ug | Freq: Once | INTRAMUSCULAR | Status: AC
  Administered 2023-02-05: 1000 ug via INTRAMUSCULAR

## 2023-02-05 NOTE — Progress Notes (Signed)
Patient given B12 injection per Dr. Katrinka Blazing. Patient tolerated injection well.

## 2023-02-05 NOTE — Telephone Encounter (Signed)
Results in and provider notes reviewed by patient in MyChart.

## 2023-02-12 ENCOUNTER — Encounter: Payer: Self-pay | Admitting: Family Medicine

## 2023-02-12 DIAGNOSIS — Z1231 Encounter for screening mammogram for malignant neoplasm of breast: Secondary | ICD-10-CM | POA: Diagnosis not present

## 2023-02-12 LAB — HM MAMMOGRAPHY

## 2023-02-13 ENCOUNTER — Ambulatory Visit: Payer: Medicare Other | Admitting: Family Medicine

## 2023-02-18 NOTE — Progress Notes (Deleted)
 [name] [address] [place] [address] Phone: [phone] Subjective:    I'm seeing this patient by the request  of:  Plotnikov, Georgina Quint, MD  CC:   [id number]  02/03/2023 History of UTIs and will recheck with patient having a paleness to her today.     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  There is a cortical irregularity zone noted of the tibial plateau on the right side that we will need to monitor.  No significant swelling noted on exam but is fairly tender.  Able to do activities of daily living and weight-bear as tolerated.  Will recheck vitamin D levels and with patient looking somewhat pale we will further evaluate for any other type of abnormality that could be contributing.  Discussed with patient about icing regimen and home exercises.  Discussed with patient that I do think we need to see her again in 3 weeks to make sure she is making improvement.  Patient is in agreement with the plan.  Follow-up again in 3 weeks      Update 02/25/2023 Kyleena CILICIA BORDEN is a 73 y.o. female coming in with complaint of B knee pain. 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   Procedure: LEFT TOTAL HIP ARTHROPLASTY ANTERIOR APPROACH;   Surgeon: Marcene Corning, MD;  Location: [facility];  Service: Orthopedics;  Laterality: Left;   Upper nl TSH w/sx     VAGINAL HYSTERECTOMY  1991   TVH, irregular bleeding   Social History   Socioeconomic History   Marital status: Married    Spouse name: Not on file   Number of children: 2   Years of education: Not on file   Highest education level: Associate degree: academic program  Occupational History   Occupation: Retired    Associate Professor: SELF EMPLOYED  Tobacco Use   Smoking status: Never   Smokeless tobacco: Never  Vaping Use   Vaping status: Never Used  Substance and Sexual Activity   Alcohol use: Yes    Comment: wine occ   Drug use: No   Sexual activity: Yes    Birth control/protection: Post-menopausal, Surgical    Comment: HYST-1st intercourse 73 yo-Fewer than 5 partners  Other Topics Concern   Not on file  Social History Narrative   Regular exercise   2 biol children-1 disabled with schizophrenia & 1 stepchild   Social Drivers of Health   Financial Resource Strain: Low Risk  (07/02/2022)   Overall Financial Resource Strain (CARDIA)    Difficulty of Paying  Living Expenses: Not hard at all  Food Insecurity: No Food Insecurity (07/02/2022)   Hunger Vital Sign    Worried About Running Out of Food in the Last Year: Never true    Ran Out of Food in the Last Year: Never true  Transportation Needs: No Transportation Needs (07/02/2022)   PRAPARE - Administrator, Civil Service (Medical): No    Lack of Transportation (Non-Medical): No  Physical Activity: Insufficiently Active (07/02/2022)   Exercise Vital Sign    Days of Exercise per Week: 3 days    Minutes of Exercise per Session: 40 min  Stress: Stress Concern Present (07/02/2022)   [employer] of Occupational Health - Occupational Stress Questionnaire    Feeling of Stress : To some extent  Social Connections: Socially Integrated (07/02/2022)   Social Connection and Isolation Panel [NHANES]    Frequency of  Communication with Friends and Family: More than three times a week    Frequency of Social Gatherings with Friends and Family: More than three times a week    Attends Religious Services: More than 4 times per year    Active Member of [employer] or Organizations: Yes    Attends Engineer, structural: More than 4 times per year    Marital Status: Married   Allergies  Allergen Reactions   Belviq [Lorcaserin Hcl] Diarrhea   Demerol [Meperidine] Other (See Comments)    Palpitations. Many years ago.   Elavil [Amitriptyline Hcl] Other (See Comments)    constipation   Hydrocodone     Confusion, a very low BP after taking Oxycodone and Norco after hip surgery, would get very orthostatic, syncopal - Oct 2022 and Dec 2021  Can take Tramadol   Oxycodone Other (See Comments)    Confusion, a very low BP after taking Oxycodone and Norco after hip surgery, would get very orthostatic, syncopal - Oct 2022 and Dec 2021  Can take Tramadol   Peanut Oil Itching   Shellfish-Derived Products Itching   Latex Hives, Itching and Rash    Redness    Medical Adhesive Remover Hives, Itching and Rash   Family History  Problem Relation Age of Onset   Heart disease Father        CAD/CABG in his 66's   Hypertension Mother    Heart failure Mother     Current Outpatient Medications (Endocrine & Metabolic):    estradiol (CLIMARA) 0.05 mg/24hr patch, Place 1 patch (0.05 mg total) onto the skin once a week.   levothyroxine (SYNTHROID) 125 MCG tablet, Take 1 tablet (125 mcg total) by mouth daily.   Current Outpatient Medications (Respiratory):    fexofenadine (ALLEGRA) 180 MG tablet, Take 180 mg by mouth daily.   Current Outpatient Medications (Hematological):    Cyanocobalamin (B-12) 2500 MCG TABS, Take 2,500 mcg by mouth daily.  Current Outpatient Medications (Other):    amoxicillin (AMOXIL) 500 MG capsule, Take 1 capsule (500 mg total) by mouth 3 (three) times daily.   Calcium Carb-Cholecalciferol  (CALCIUM + D3 PO), Take by mouth. Calcium 600mg  & vit d3 2000 iu   citalopram (CELEXA) 20 MG tablet, Take 1 tablet (20 mg total) by mouth daily.   CRANBERRY EXTRACT PO, Take 1 capsule by mouth daily.    D-Mannose 500 MG CAPS, Take 500 mg by mouth daily.   diphenoxylate-atropine (LOMOTIL) 2.5-0.025 MG tablet, Take 1-2 tablets by mouth 4 (four) times daily as needed for diarrhea or loose stools.   hydrocortisone (ANUSOL-HC) 25 MG suppository, Place  1 suppository (25 mg total) rectally 2 (two) times daily as needed for hemorrhoids or anal itching.   Multiple Vitamin (MULTIVITAMIN) tablet, Take 1 tablet by mouth daily.   NON FORMULARY, Take 1 capsule by mouth at bedtime. Tart cherry extract   ondansetron (ZOFRAN) 4 MG tablet, Take 1 tablet (4 mg total) by mouth every 8 (eight) hours as needed for nausea or vomiting.   pantoprazole (PROTONIX) 40 MG tablet, Take 1 tablet (40 mg total) by mouth daily.   Probiotic Product (PROBIOTIC PO), Take by mouth. 60 billion   traZODone (DESYREL) 50 MG tablet, Take 1 tablet (50 mg total) by mouth at bedtime.   triamcinolone ointment (KENALOG) 0.5 %, Use qid prn   trimethoprim-polymyxin b (POLYTRIM) ophthalmic solution, Place 1 drop into the left eye every 6 (six) hours.   TURMERIC PO, Take 1 capsule by mouth daily.   UNABLE TO FIND, Med Name: nutrafol   Valerian Root 100 MG CAPS, Take 100 mg by mouth at bedtime.   Reviewed prior external information including notes and imaging from  primary care provider As well as notes that were available from care everywhere and other healthcare systems.  Past medical history, social, surgical and family history all reviewed in electronic medical record.  No pertanent information unless stated regarding to the chief complaint.   Review of Systems:  No headache, visual changes, nausea, vomiting, diarrhea, constipation, dizziness, abdominal pain, skin rash, fevers, chills, night sweats, weight loss, swollen lymph nodes, body  aches, joint swelling, chest pain, shortness of breath, mood changes. POSITIVE muscle aches  Objective  There were no vitals taken for this visit.   General: No apparent distress alert and oriented x3 mood and affect normal, dressed appropriately.  HEENT: Pupils equal, extraocular movements intact  Respiratory: Patient's speak in full sentences and does not appear short of breath  Cardiovascular: No lower extremity edema, non tender, no erythema      Impression and Recommendations:

## 2023-02-19 ENCOUNTER — Telehealth: Payer: Medicare Other

## 2023-02-20 ENCOUNTER — Telehealth: Payer: Medicare Other | Admitting: Family Medicine

## 2023-02-20 DIAGNOSIS — J111 Influenza due to unidentified influenza virus with other respiratory manifestations: Secondary | ICD-10-CM | POA: Diagnosis not present

## 2023-02-20 MED ORDER — PROMETHAZINE-DM 6.25-15 MG/5ML PO SYRP
5.0000 mL | ORAL_SOLUTION | Freq: Four times a day (QID) | ORAL | 0 refills | Status: AC | PRN
Start: 2023-02-20 — End: 2023-03-02

## 2023-02-20 NOTE — Patient Instructions (Signed)

## 2023-02-20 NOTE — Progress Notes (Signed)
Virtual Visit Consent   Tayler Heiden Bartolo Darter, you are scheduled for a virtual visit with a [facility] provider today. Just as with appointments in the office, your consent must be obtained to participate. Your consent will be active for this visit and any virtual visit you may have with one of our providers in the next 365 days. If you have a MyChart account, a copy of this consent can be sent to you electronically.  As this is a virtual visit, video technology does not allow for your provider to perform a traditional examination. This may limit your provider's ability to fully assess your condition. If your provider identifies any concerns that need to be evaluated in person or the need to arrange testing (such as labs, EKG, etc.), we will make arrangements to do so. Although advances in technology are sophisticated, we cannot ensure that it will always work on either your end or our end. If the connection with a video visit is poor, the visit may have to be switched to a telephone visit. With either a video or telephone visit, we are not always able to ensure that we have a secure connection.  By engaging in this virtual visit, you consent to the provision of healthcare and authorize for your insurance to be billed (if applicable) for the services provided during this visit. Depending on your insurance coverage, you may receive a charge related to this service.  I need to obtain your verbal consent now. Are you willing to proceed with your visit today? Sham Alviar Bartolo Darter has provided verbal consent on 02/20/2023 for a virtual visit (video or telephone). Georgana Curio, FNP  Date: 02/20/2023 2:19 PM   Virtual Visit via Video Note   I, Georgana Curio, connected with  AVIAN GREENAWALT  ([id number], 09-14-1950) on 02/20/23 at  2:15 PM EST by a video-enabled telemedicine application and verified that I am speaking with the correct person using two identifiers.  Location: Patient: Virtual Visit Location Patient:  Home Provider: Virtual Visit Location Provider: Home Office   I discussed the limitations of evaluation and management by telemedicine and the availability of in person appointments. The patient expressed understanding and agreed to proceed.    History of Present Illness: Ellen [name] is a 73 y.o. who identifies as a female who was assigned female at birth, and is being seen today for flu for 5 days with diarrhea- needs cough meds. Sx persist. In no distress.  [name]  HPI: HPI  Problems:  Patient Active Problem List   Diagnosis Date Noted   Trigger point of left shoulder region 11/14/2022   Sensorineural hearing loss, bilateral 10/18/2022   Tinnitus of both ears 10/18/2022   Elevated BP without diagnosis of hypertension 10/06/2022   Degenerative arthritis of knee, bilateral 08/29/2022   Menopausal syndrome (hot flashes) 08/13/2022   Hemorrhoids 07/03/2022   IBS (irritable bowel syndrome) 07/03/2022   Acquired trigger finger of right ring finger 02/17/2022   Degenerative tear of lateral meniscus, right 02/17/2022   Hypotension after procedure 09/03/2021   Chronic scapular pain 06/18/2021   Chronic atrophic gastritis 03/04/2021   Diverticular disease of colon 03/04/2021   Gastroduodenitis 03/04/2021   Hashimoto's thyroiditis 03/04/2021   History of corticosteroid therapy 03/04/2021   Vitamin D deficiency 03/04/2021   Memory change 03/04/2021   Primary localized osteoarthritis of left hip 10/16/2020   Primary osteoarthritis of left hip 10/16/2020   Greater trochanteric bursitis of left hip 08/29/2020   Hair loss 02/29/2020  Preop exam for internal medicine 11/21/2019   Hip osteoarthritis 09/29/2019   Degenerative disc disease, lumbar 09/29/2019   Agatston CAC score, <100 09/26/2019   Acute medial meniscus tear, right, initial encounter 08/18/2019   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   Dry skin 05/24/2019   UTI (urinary tract infection) 05/23/2019   Nausea 05/09/2019   Strain of  muscle of right groin region 03/14/2019   Pes anserinus bursitis of right knee 10/19/2018   Morton's neuroma of left foot 06/08/2018   Acute suppurative otitis media of both ears without spontaneous rupture of tympanic membranes 02/10/2017   ETD (Eustachian tube dysfunction), bilateral 02/10/2017   Dermatitis 01/01/2017   Acute MEE (middle ear effusion) 11/10/2016   Rotator cuff syndrome, right 10/13/2016   Bacterial sinusitis 08/20/2016   Greater trochanteric bursitis of right hip 09/24/2015   Cellulitis and abscess of leg 08/31/2015   Eosinophilia 03/27/2015   Neoplasm of uncertain behavior of skin 12/14/2014   Cough 09/28/2014   Conjunctivitis 02/13/2014   Hypothyroidism 01/13/2014   Dark stools 04/16/2012   RUQ abdominal pain 04/16/2012   Cold sore 11/18/2011   Well adult exam 07/15/2011   Closed right ankle fracture 07/15/2011   Hypertension 07/15/2011   Rash and other nonspecific skin eruption 10/19/2007   OTITIS MEDIA, CHRONIC SEROUS 08/31/2007   Dyslipidemia 07/28/2007   GERD 04/13/2007   Generalized anxiety disorder 10/16/2006   Insomnia 10/16/2006   ALLERGIC RHINITIS 10/16/2006   Cystitis 10/16/2006    Allergies:  Allergies  Allergen Reactions   Belviq [Lorcaserin Hcl] Diarrhea   Demerol [Meperidine] Other (See Comments)    Palpitations. Many years ago.   Elavil [Amitriptyline Hcl] Other (See Comments)    constipation   Hydrocodone     Confusion, a very low BP after taking Oxycodone and Norco after hip surgery, would get very orthostatic, syncopal - Oct 2022 and Dec 2021  Can take Tramadol   Oxycodone Other (See Comments)    Confusion, a very low BP after taking Oxycodone and Norco after hip surgery, would get very orthostatic, syncopal - Oct 2022 and Dec 2021  Can take Tramadol   Peanut Oil Itching   Shellfish-Derived Products Itching   Latex Hives, Itching and Rash    Redness    Medical Adhesive Remover Hives, Itching and Rash   Medications:  Current  Outpatient Medications:    promethazine-dextromethorphan (PROMETHAZINE-DM) 6.25-15 MG/5ML syrup, Take 5 mLs by mouth 4 (four) times daily as needed for up to 10 days for cough., Disp: 118 mL, Rfl: 0   amoxicillin (AMOXIL) 500 MG capsule, Take 1 capsule (500 mg total) by mouth 3 (three) times daily., Disp: 30 capsule, Rfl: 0   Calcium Carb-Cholecalciferol (CALCIUM + D3 PO), Take by mouth. Calcium 600mg  & vit d3 2000 iu, Disp: , Rfl:    citalopram (CELEXA) 20 MG tablet, Take 1 tablet (20 mg total) by mouth daily., Disp: 90 tablet, Rfl: 3   CRANBERRY EXTRACT PO, Take 1 capsule by mouth daily. , Disp: , Rfl:    Cyanocobalamin (B-12) 2500 MCG TABS, Take 2,500 mcg by mouth daily., Disp: , Rfl:    D-Mannose 500 MG CAPS, Take 500 mg by mouth daily., Disp: , Rfl:    diphenoxylate-atropine (LOMOTIL) 2.5-0.025 MG tablet, Take 1-2 tablets by mouth 4 (four) times daily as needed for diarrhea or loose stools., Disp: 60 tablet, Rfl: 2   estradiol (CLIMARA) 0.05 mg/24hr patch, Place 1 patch (0.05 mg total) onto the skin once a week., Disp: 4 patch,  Rfl: 6   fexofenadine (ALLEGRA) 180 MG tablet, Take 180 mg by mouth daily., Disp: , Rfl:    hydrocortisone (ANUSOL-HC) 25 MG suppository, Place 1 suppository (25 mg total) rectally 2 (two) times daily as needed for hemorrhoids or anal itching., Disp: 20 suppository, Rfl: 1   levothyroxine (SYNTHROID) 125 MCG tablet, Take 1 tablet (125 mcg total) by mouth daily., Disp: 90 tablet, Rfl: 3   Multiple Vitamin (MULTIVITAMIN) tablet, Take 1 tablet by mouth daily., Disp: , Rfl:    NON FORMULARY, Take 1 capsule by mouth at bedtime. Tart cherry extract, Disp: , Rfl:    ondansetron (ZOFRAN) 4 MG tablet, Take 1 tablet (4 mg total) by mouth every 8 (eight) hours as needed for nausea or vomiting., Disp: 20 tablet, Rfl: 0   pantoprazole (PROTONIX) 40 MG tablet, Take 1 tablet (40 mg total) by mouth daily., Disp: 90 tablet, Rfl: 3   Probiotic Product (PROBIOTIC PO), Take by mouth. 60  billion, Disp: , Rfl:    traZODone (DESYREL) 50 MG tablet, Take 1 tablet (50 mg total) by mouth at bedtime., Disp: 90 tablet, Rfl: 3   triamcinolone ointment (KENALOG) 0.5 %, Use qid prn, Disp: 90 g, Rfl: 3   trimethoprim-polymyxin b (POLYTRIM) ophthalmic solution, Place 1 drop into the left eye every 6 (six) hours., Disp: 10 mL, Rfl: 0   TURMERIC PO, Take 1 capsule by mouth daily., Disp: , Rfl:    UNABLE TO FIND, Med Name: nutrafol, Disp: , Rfl:    Valerian Root 100 MG CAPS, Take 100 mg by mouth at bedtime., Disp: , Rfl:   Observations/Objective: Patient is well-developed, well-nourished in no acute distress.  Resting comfortably  at home.  Head is normocephalic, atraumatic.  No labored breathing.  Speech is clear and coherent with logical content.  Patient is alert and oriented at baseline.    Assessment and Plan: There are no diagnoses linked to this encounter. Increase fluids, humidifier at night, tylenol, UC if sx persist or worsen.   Follow Up Instructions: I discussed the assessment and treatment plan with the patient. The patient was provided an opportunity to ask questions and all were answered. The patient agreed with the plan and demonstrated an understanding of the instructions.  A copy of instructions were sent to the patient via MyChart unless otherwise noted below.     The patient was advised to call back or seek an in-person evaluation if the symptoms worsen or if the condition fails to improve as anticipated.    Georgana Curio, FNP

## 2023-02-25 ENCOUNTER — Ambulatory Visit: Payer: Medicare Other | Admitting: Family Medicine

## 2023-03-01 ENCOUNTER — Encounter: Payer: Self-pay | Admitting: Internal Medicine

## 2023-03-04 NOTE — Progress Notes (Unsigned)
  [name] [address] [place] [address] Phone: [phone] Subjective:   Morene Antu am a scribe for Dr. Katrinka Blazing.   I'm seeing this patient by the request  of:  Plotnikov, Georgina Quint, MD  CC: Multiple joint complaint follow-up  [id number]  02/03/2023 History of UTIs and will recheck with patient having a paleness to her today.     Recheck vitamin D with patient having a questionable tibial plateau injury     Degenerative arthritis in the knees bilaterally and I do believe that it is more of an exacerbation. There is a cortical irregularity zone noted of the tibial plateau on the right side that we will need to monitor. No significant swelling noted on exam but is fairly tender. Able to do activities of daily living and weight-bear as tolerated. Will recheck vitamin D levels and with patient looking somewhat pale we will further evaluate for any other type of abnormality that could be contributing. Discussed with patient about icing regimen and home exercises. Discussed with patient that I do think we need to see her again in 3 weeks to make sure she is making improvement. Patient is in agreement with the plan. Follow-up again in 3 weeks   Update 03/06/2023 MACEL YEARSLEY is a 73 y.o. female coming in with complaint of B knee pain. Patient states knees are doing much better but they were rough for a while. Low in iron and b12.        Past Medical History:  Diagnosis Date   ALLERGIC RHINITIS    ANXIETY    Arthritis    GERD    HSV infection    oral   Hypothyroidism    INSOMNIA-SLEEP DISORDER-UNSPEC    Osteopenia 01/2016   T score -1.2 FRAX 15%/0.9%   Shingles    Thyroid disease      Objective  Blood pressure 122/60, pulse 78, height 5\' 2"  (1.575 m), weight 139 lb 12.8 oz (63.4 kg), SpO2 97%.   General: No apparent distress alert and oriented x3 mood and affect normal, dressed appropriately.  HEENT: Pupils equal, extraocular  movements intact  Respiratory: Patient's speak in full sentences and does not appear short of breath  Cardiovascular: No lower extremity edema, non tender, no erythema   Bilateral knees do not have any significant swelling.  Still tight for the last 5 degrees of flexion no significant instability but does still have some instability with varus and valgus force.  Able to get up from a seated position significantly improved.    Impression and Recommendations:     The above documentation has been reviewed and is accurate and complete Judi Saa, DO

## 2023-03-05 ENCOUNTER — Ambulatory Visit: Payer: Medicare Other

## 2023-03-06 ENCOUNTER — Ambulatory Visit: Payer: Medicare Other | Admitting: Family Medicine

## 2023-03-06 VITALS — BP 122/60 | HR 78 | Ht 62.0 in | Wt 139.8 lb

## 2023-03-06 DIAGNOSIS — M170 Bilateral primary osteoarthritis of knee: Secondary | ICD-10-CM

## 2023-03-06 DIAGNOSIS — E538 Deficiency of other specified B group vitamins: Secondary | ICD-10-CM | POA: Diagnosis not present

## 2023-03-06 MED ORDER — CYANOCOBALAMIN 1000 MCG/ML IJ SOLN
1000.0000 ug | Freq: Once | INTRAMUSCULAR | Status: AC
  Administered 2023-03-06: 1000 ug via INTRAMUSCULAR

## 2023-03-06 NOTE — Patient Instructions (Addendum)
 B12 injection today. Good to see you. Talk to Dr. Jorene Guest to get the new one (pneumonia shot).  Return in 2 months.

## 2023-03-06 NOTE — Assessment & Plan Note (Signed)
 Patient seems to be doing significantly better than previous exam.  Hopeful that patient will continue to make improvement.  Do not think we need to make any other changes.  Worsening pain would consider the possibility of injections.  Patient is in agreement with the plan.  Follow-up again in 6 to 8 weeks.

## 2023-03-09 ENCOUNTER — Other Ambulatory Visit: Payer: Self-pay | Admitting: Internal Medicine

## 2023-03-09 MED ORDER — AZITHROMYCIN 250 MG PO TABS: ORAL_TABLET | ORAL | 0 refills | Status: DC

## 2023-03-19 ENCOUNTER — Encounter: Payer: Self-pay | Admitting: Internal Medicine

## 2023-03-19 ENCOUNTER — Ambulatory Visit: Payer: Medicare Other | Admitting: Internal Medicine

## 2023-03-19 VITALS — BP 120/78 | HR 71 | Temp 98.0°F | Ht 62.0 in | Wt 148.0 lb

## 2023-03-19 DIAGNOSIS — J069 Acute upper respiratory infection, unspecified: Secondary | ICD-10-CM | POA: Diagnosis not present

## 2023-03-19 DIAGNOSIS — J329 Chronic sinusitis, unspecified: Secondary | ICD-10-CM

## 2023-03-19 DIAGNOSIS — E559 Vitamin D deficiency, unspecified: Secondary | ICD-10-CM | POA: Diagnosis not present

## 2023-03-19 DIAGNOSIS — E538 Deficiency of other specified B group vitamins: Secondary | ICD-10-CM | POA: Insufficient documentation

## 2023-03-19 DIAGNOSIS — E785 Hyperlipidemia, unspecified: Secondary | ICD-10-CM

## 2023-03-19 DIAGNOSIS — B001 Herpesviral vesicular dermatitis: Secondary | ICD-10-CM

## 2023-03-19 DIAGNOSIS — J4530 Mild persistent asthma, uncomplicated: Secondary | ICD-10-CM

## 2023-03-19 DIAGNOSIS — E034 Atrophy of thyroid (acquired): Secondary | ICD-10-CM | POA: Diagnosis not present

## 2023-03-19 DIAGNOSIS — B9689 Other specified bacterial agents as the cause of diseases classified elsewhere: Secondary | ICD-10-CM

## 2023-03-19 MED ORDER — VALACYCLOVIR HCL 500 MG PO TABS
500.0000 mg | ORAL_TABLET | Freq: Two times a day (BID) | ORAL | 2 refills | Status: AC
Start: 2023-03-19 — End: ?

## 2023-03-19 NOTE — Progress Notes (Signed)
 This point  Subjective:  Patient ID: Ellen [name], female    DOB: March 21, 1950  Age: 73 y.o. MRN: [id number]  CC: Medical Management of Chronic Issues   HPI Ellen [name] presents for a flu x 4 wks, just recovered. Dane took a Zpack. She is feeling well now F/u on B12 def  Outpatient Medications Prior to Visit  Medication Sig Dispense Refill  . Calcium Carb-Cholecalciferol (CALCIUM + D3 PO) Take by mouth. Calcium 600mg  & vit d3 2000 iu    . citalopram (CELEXA) 20 MG tablet Take 1 tablet (20 mg total) by mouth daily. 90 tablet 3  . CRANBERRY EXTRACT PO Take 1 capsule by mouth daily.     . Cyanocobalamin (B-12) 2500 MCG TABS Take 2,500 mcg by mouth daily.    . D-Mannose 500 MG CAPS Take 500 mg by mouth daily.    . diphenoxylate-atropine (LOMOTIL) 2.5-0.025 MG tablet Take 1-2 tablets by mouth 4 (four) times daily as needed for diarrhea or loose stools. 60 tablet 2  . estradiol (CLIMARA) 0.05 mg/24hr patch Place 1 patch (0.05 mg total) onto the skin once a week. 4 patch 6  . fexofenadine (ALLEGRA) 180 MG tablet Take 180 mg by mouth daily.    . hydrocortisone (ANUSOL-HC) 25 MG suppository Place 1 suppository (25 mg total) rectally 2 (two) times daily as needed for hemorrhoids or anal itching. 20 suppository 1  . levothyroxine (SYNTHROID) 125 MCG tablet Take 1 tablet (125 mcg total) by mouth daily. 90 tablet 3  . Multiple Vitamin (MULTIVITAMIN) tablet Take 1 tablet by mouth daily.    . NON FORMULARY Take 1 capsule by mouth at bedtime. Tart cherry extract    . ondansetron (ZOFRAN) 4 MG tablet Take 1 tablet (4 mg total) by mouth every 8 (eight) hours as needed for nausea or vomiting. 20 tablet 0  . pantoprazole (PROTONIX) 40 MG tablet Take 1 tablet (40 mg total) by mouth daily. 90 tablet 3  . Probiotic Product (PROBIOTIC PO) Take by mouth. 60 billion    . traZODone (DESYREL) 50 MG tablet Take 1 tablet (50 mg total) by mouth at bedtime. 90 tablet 3  . triamcinolone ointment (KENALOG) 0.5 % Use qid  prn 90 g 3  . trimethoprim-polymyxin b (POLYTRIM) ophthalmic solution Place 1 drop into the left eye every 6 (six) hours. 10 mL 0  . TURMERIC PO Take 1 capsule by mouth daily.    [name] UNABLE TO FIND Med Name: nutrafol    . Valerian Root 100 MG CAPS Take 100 mg by mouth at bedtime.    [name] azithromycin (ZITHROMAX Z-PAK) 250 MG tablet As directed (Patient not taking: Reported on 03/19/2023) 6 tablet 0  . amoxicillin (AMOXIL) 500 MG capsule Take 1 capsule (500 mg total) by mouth 3 (three) times daily. 30 capsule 0   No facility-administered medications prior to visit.    ROS: Review of Systems  Constitutional:  Positive for fatigue. Negative for activity change, appetite change, chills and unexpected weight change.  HENT:  Negative for congestion, mouth sores and sinus pressure.   Eyes:  Negative for visual disturbance.  Respiratory:  Negative for cough and chest tightness.   Gastrointestinal:  Negative for abdominal pain and nausea.  Genitourinary:  Negative for difficulty urinating, frequency and vaginal pain.  Musculoskeletal:  Negative for back pain and gait problem.  Skin:  Negative for pallor and rash.  Neurological:  Negative for dizziness, tremors, weakness, numbness and headaches.  Psychiatric/Behavioral:  Negative for confusion  and sleep disturbance.     Objective:  BP 120/78   Pulse 71   Temp 98 F (36.7 C) (Oral)   Ht 5\' 2"  (1.575 m)   Wt 148 lb (67.1 kg)   SpO2 98%   BMI 27.07 kg/m   BP Readings from Last 3 Encounters:  03/19/23 120/78  03/06/23 122/60  02/03/23 124/80    Wt Readings from Last 3 Encounters:  03/19/23 148 lb (67.1 kg)  03/06/23 139 lb 12.8 oz (63.4 kg)  11/14/22 135 lb (61.2 kg)    Physical Exam Constitutional:      General: She is not in acute distress.    Appearance: She is well-developed. She is obese.  HENT:     Head: Normocephalic.     Right Ear: External ear normal.     Left Ear: External ear normal.     Nose: Nose normal.  Eyes:      General:        Right eye: No discharge.        Left eye: No discharge.     Conjunctiva/sclera: Conjunctivae normal.     Pupils: Pupils are equal, round, and reactive to light.  Neck:     Thyroid: No thyromegaly.     Vascular: No JVD.     Trachea: No tracheal deviation.  Cardiovascular:     Rate and Rhythm: Normal rate and regular rhythm.     Heart sounds: Normal heart sounds.  Pulmonary:     Effort: No respiratory distress.     Breath sounds: No stridor. No wheezing.  Abdominal:     General: Bowel sounds are normal. There is no distension.     Palpations: Abdomen is soft. There is no mass.     Tenderness: There is no abdominal tenderness. There is no guarding or rebound.  Musculoskeletal:        General: No tenderness.     Cervical back: Normal range of motion and neck supple. No rigidity.  Lymphadenopathy:     Cervical: No cervical adenopathy.  Skin:    Findings: No erythema or rash.  Neurological:     Cranial Nerves: No cranial nerve deficit.     Motor: No abnormal muscle tone.     Coordination: Coordination normal.     Deep Tendon Reflexes: Reflexes normal.  Psychiatric:        Behavior: Behavior normal.        Thought Content: Thought content normal.        Judgment: Judgment normal.    Lab Results  Component Value Date   WBC 6.7 02/03/2023   HGB 14.1 02/03/2023   HCT 42.4 02/03/2023   PLT 218.0 02/03/2023   GLUCOSE 82 02/03/2023   CHOL 193 03/05/2022   TRIG 153.0 (H) 03/05/2022   HDL 55.40 03/05/2022   LDLDIRECT 142.6 07/09/2011   LDLCALC 107 (H) 03/05/2022   ALT 15 02/03/2023   AST 26 02/03/2023   NA 137 02/03/2023   K 4.6 02/03/2023   CL 106 02/03/2023   CREATININE 0.69 02/03/2023   BUN 9 02/03/2023   CO2 24 02/03/2023   TSH 1.19 05/28/2022   INR 0.9 10/16/2020   HGBA1C 5.6 10/12/2006    DG INJECT DIAG/THERA/INC NEEDLE/CATH/PLC EPI/LUMB/SAC W/IMG Result Date: 06/30/2022 CLINICAL DATA:  Lumbosacral spondylosis without myelopathy. 85% relief  following the initial epidural injection at L3-4 on 04/10/2022. Pain has recurred in the low back and left lower leg. Specific request for an injection at L4-5 today. FLUOROSCOPY: Radiation Exposure  Index (as provided by the fluoroscopic device): 2.80 mGy Kerma PROCEDURE: The procedure, risks, benefits, and alternatives were explained to the patient. Questions regarding the procedure were encouraged and answered. The patient understands and consents to the procedure. LUMBAR EPIDURAL INJECTION: An interlaminar approach was performed on the left at L4-5. The overlying skin was cleansed and anesthetized. A 3.5 inch 20 gauge epidural needle was advanced using loss-of-resistance technique. DIAGNOSTIC EPIDURAL INJECTION Injection of Isovue-M 200 shows a good epidural pattern with spread above and below the level of needle placement primarily on the left. No vascular opacification is seen. THERAPEUTIC EPIDURAL INJECTION: 80 mg of Depo-Medrol mixed with 3 mL of 1% lidocaine were instilled. The procedure was well-tolerated, and the patient was discharged thirty minutes following the injection in good condition. COMPLICATIONS: None immediate IMPRESSION: Technically successful interlaminar epidural injection on the left at L4-5. Electronically Signed   By: Sebastian Ache M.D.   On: 06/30/2022 14:21    Assessment & Plan:   Problem List Items Addressed This Visit     RESOLVED: Asthmatic bronchitis   D/c Dulera  Start Breo Zpac Declined Depomedrol      B12 deficiency   On B12 inj q1 month      Relevant Orders   CBC with Differential/Platelet   Vitamin B12   Bacterial sinusitis   Treated w/Z pack - better      Relevant Medications   valACYclovir (VALTREX) 500 MG tablet   Cold sore   Relevant Medications   valACYclovir (VALTREX) 500 MG tablet   Dyslipidemia   Red rice yeast extract      Relevant Orders   T4, free   Lipid panel   Hypothyroidism    Cont w/LEVOTHROID 112 MCG/D      Relevant  Orders   CBC with Differential/Platelet   Comprehensive metabolic panel   T4, free   Vitamin B12   VITAMIN D 25 Hydroxy (Vit-D Deficiency, Fractures)   TSH   Lipid panel   Viral upper respiratory tract infection - Primary   Resolving      Relevant Medications   valACYclovir (VALTREX) 500 MG tablet   Vitamin D deficiency   On Vit D      Relevant Orders   VITAMIN D 25 Hydroxy (Vit-D Deficiency, Fractures)      Meds ordered this encounter  Medications  . valACYclovir (VALTREX) 500 MG tablet    Sig: Take 1 tablet (500 mg total) by mouth 2 (two) times daily.    Dispense:  14 tablet    Refill:  2      Follow-up: Return in about 6 months (around 09/19/2023) for Wellness Exam.  Sonda Primes, MD

## 2023-03-19 NOTE — Assessment & Plan Note (Signed)
Cont w/LEVOTHROID 112 MCG/D

## 2023-03-19 NOTE — Assessment & Plan Note (Signed)
D/c Dulera  Start Breo Zpac Declined Depomedrol

## 2023-03-19 NOTE — Assessment & Plan Note (Signed)
On B12 inj q 1 month

## 2023-03-19 NOTE — Assessment & Plan Note (Signed)
 Treated w/Z pack - better

## 2023-03-19 NOTE — Assessment & Plan Note (Signed)
Red rice yeast extract 

## 2023-03-19 NOTE — Assessment & Plan Note (Signed)
 Resolving

## 2023-03-19 NOTE — Assessment & Plan Note (Signed)
 On Vit D

## 2023-03-24 ENCOUNTER — Encounter: Payer: Self-pay | Admitting: Family Medicine

## 2023-03-25 ENCOUNTER — Other Ambulatory Visit: Payer: Self-pay

## 2023-03-25 DIAGNOSIS — M5416 Radiculopathy, lumbar region: Secondary | ICD-10-CM

## 2023-03-31 NOTE — Discharge Instructions (Signed)

## 2023-04-01 ENCOUNTER — Ambulatory Visit
Admission: RE | Admit: 2023-04-01 | Discharge: 2023-04-01 | Disposition: A | Discharge status: Home / Self Care | Source: Ambulatory Visit | Attending: Family Medicine | Admitting: Family Medicine

## 2023-04-01 DIAGNOSIS — M5416 Radiculopathy, lumbar region: Secondary | ICD-10-CM | POA: Diagnosis not present

## 2023-04-01 MED ORDER — IOPAMIDOL (ISOVUE-M 200) INJECTION 41%
1.0000 mL | Freq: Once | INTRAMUSCULAR | Status: AC
  Administered 2023-04-01: 1 mL via EPIDURAL

## 2023-04-01 MED ORDER — METHYLPREDNISOLONE ACETATE 40 MG/ML INJ SUSP (RADIOLOG
80.0000 mg | Freq: Once | INTRAMUSCULAR | Status: AC
  Administered 2023-04-01: 80 mg via EPIDURAL

## 2023-04-06 ENCOUNTER — Ambulatory Visit (INDEPENDENT_AMBULATORY_CARE_PROVIDER_SITE_OTHER): Payer: Medicare Other

## 2023-04-06 DIAGNOSIS — E538 Deficiency of other specified B group vitamins: Secondary | ICD-10-CM

## 2023-04-06 MED ORDER — CYANOCOBALAMIN 1000 MCG/ML IJ SOLN
1000.0000 ug | Freq: Once | INTRAMUSCULAR | Status: AC
  Administered 2023-04-06: 1000 ug via INTRAMUSCULAR

## 2023-04-06 NOTE — Progress Notes (Signed)
 Patient received B12 (cyanocobalamin) 1,000 mcg/ml in left deltoid today. Patient tolerated well.

## 2023-04-13 ENCOUNTER — Encounter: Payer: Self-pay | Admitting: Pediatrics

## 2023-04-14 ENCOUNTER — Telehealth: Payer: Self-pay | Admitting: Pediatrics

## 2023-04-14 NOTE — Telephone Encounter (Addendum)
 Good morning Dr. Doy Hutching,   We received a call from this patient wishing to establish GI care with you due to Dr. Ardell Isaacs retirement. Patient states she was seen by [facility] in 06/2022 and had an endoscopy in 2022. She states she was only seen by them due to scheduling issues she had back in 2022. She would now like to re-establish care with our practice due to all of her providers being under [facility] and [facility] and also because her husband is our patient as well. Records from procedures and office visits were scanned into media for you to review. Would you please advise on scheduling?  Thank you.

## 2023-04-17 ENCOUNTER — Encounter: Payer: Self-pay | Admitting: Family Medicine

## 2023-04-20 NOTE — Telephone Encounter (Signed)
 Called patient to advise on previous recommendations. Unable to reach.

## 2023-04-21 NOTE — Telephone Encounter (Signed)
 Good morning Dr. Yvone Herd,   Once we advised patient of your recommendations, she requested for a message to be sent for you to possibly reconsider being that her husband is scheduled with you and they would both like to have the same GI. She states she only went to [facility] for an emergency because Dr. Sandrea Cruel was out of the office, but was previous established with him for years. She states she would "really appreciate it" if you reconsidered. Would you please advise?  Thank you.

## 2023-04-30 NOTE — Progress Notes (Signed)
 Ellen [name] [address] [place] [address] Phone: [phone] Subjective:   Ellen [name], am serving as a scribe for Dr. Ronnell [name].  I'm seeing this patient by the request  of:  Ellen [name], Ellen Bellman, MD  CC: Left low back and right knee pain follow-up  BJY:[id number]  03/06/2023 Patient seems to be doing significantly better than previous exam. Hopeful that patient will continue to make improvement. Do not think we need to make any other changes. Worsening pain would consider the possibility of injections. Patient is in agreement with the plan. Follow-up again in 6 to 8 weeks.   Update 05/05/2023 Ellen [name] is a 73 y.o. female coming in with complaint of LBP and R knee pain.  Patient did have an epidural in April 01, 2023 for the lumbar spine on the left L5.  Patient states that she is having lateral knee pain. Feels like tibia is almost shifting when she rides. Using knee braces with activity. Pain is dull at rest but is sharp during activity. Uses pedal assisted bike   LBP is doing much better after epidural.    Regarding right shin has had x-rays back in January 2025 showing mild patellofemoral and medial compartment arthritis with small joint effusion.  Past Medical History:  Diagnosis Date   ALLERGIC RHINITIS    ANXIETY    Arthritis    GERD    HSV infection    oral   Hypothyroidism    INSOMNIA-SLEEP DISORDER-UNSPEC    Osteopenia 01/2016   T score -1.2 FRAX 15%/0.9%   Shingles    Thyroid  disease    Past Surgical History:  Procedure Laterality Date   ANKLE FRACTURE SURGERY  2012   R   APPENDECTOMY     BREAST SURGERY     REDUCTION MAMMOPLASTY   CESAREAN SECTION     twice   COSMETIC SURGERY     FACELIFT     s/p   HIP SURGERY Right 12/08/2019   Patient reported   TONSILLECTOMY  1974   TOTAL HIP ARTHROPLASTY Left 10/16/2020   Procedure: LEFT TOTAL HIP ARTHROPLASTY ANTERIOR APPROACH;  Surgeon: Ellen Even, MD;   Location: [facility];  Service: Orthopedics;  Laterality: Left;   Upper nl TSH w/sx     VAGINAL HYSTERECTOMY  1991   TVH, irregular bleeding   Social History   Socioeconomic History   Marital status: Married    Spouse name: Not on file   Number of children: 2   Years of education: Not on file   Highest education level: Associate degree: academic program  Occupational History   Occupation: Retired    Associate Professor: SELF EMPLOYED  Tobacco Use   Smoking status: Never   Smokeless tobacco: Never  Vaping Use   Vaping status: Never Used  Substance and Sexual Activity   Alcohol use: Yes    Comment: wine occ   Drug use: No   Sexual activity: Yes    Birth control/protection: Post-menopausal, Surgical    Comment: HYST-1st intercourse 73 yo-Fewer than 5 partners  Other Topics Concern   Not on file  Social History Narrative   Regular exercise   2 biol children-1 disabled with schizophrenia & 1 stepchild   Social Drivers of Corporate investment banker Strain: Low Risk  (03/16/2023)   Overall Financial Resource Strain (CARDIA)    Difficulty of Paying Living Expenses: Not hard at all  Food Insecurity: No Food Insecurity (03/16/2023)   Hunger Vital  Sign    Worried About Programme researcher, broadcasting/film/video in the Last Year: Never true    Ran Out of Food in the Last Year: Never true  Transportation Needs: No Transportation Needs (03/16/2023)   PRAPARE - Administrator, Civil Service (Medical): No    Lack of Transportation (Non-Medical): No  Physical Activity: Insufficiently Active (03/16/2023)   Exercise Vital Sign    Days of Exercise per Week: 4 days    Minutes of Exercise per Session: 30 min  Stress: No Stress Concern Present (03/16/2023)   [employer] of Occupational Health - Occupational Stress Questionnaire    Feeling of Stress : Not at all  Social Connections: Socially Integrated (03/16/2023)   Social Connection and Isolation Panel [NHANES]    Frequency of Communication with Friends and  Family: More than three times a week    Frequency of Social Gatherings with Friends and Family: More than three times a week    Attends Religious Services: 1 to 4 times per year    Active Member of [employer] or Organizations: Yes    Attends Engineer, structural: More than 4 times per year    Marital Status: Married   Allergies  Allergen Reactions   Belviq [Lorcaserin  Hcl] Diarrhea   Demerol [Meperidine] Other (See Comments)    Palpitations. Many years ago.   Elavil  [Amitriptyline  Hcl] Other (See Comments)    constipation   Hydrocodone      Confusion, a very low BP after taking Oxycodone  and Norco after hip surgery, would get very orthostatic, syncopal - Oct 2022 and Dec 2021  Can take Tramadol    Oxycodone  Other (See Comments)    Confusion, a very low BP after taking Oxycodone  and Norco after hip surgery, would get very orthostatic, syncopal - Oct 2022 and Dec 2021  Can take Tramadol    Peanut Oil Itching   Shellfish-Derived Products Itching   Latex Hives, Itching and Rash    Redness    Medical Adhesive Remover Hives, Itching and Rash   Family History  Problem Relation Age of Onset   Heart disease Father        CAD/CABG in his 34's   Hypertension Mother    Heart failure Mother     Current Outpatient Medications (Endocrine & Metabolic):    estradiol  (CLIMARA ) 0.05 mg/24hr patch, Place 1 patch (0.05 mg total) onto the skin once a week.   levothyroxine  (SYNTHROID ) 125 MCG tablet, Take 1 tablet (125 mcg total) by mouth daily.   Current Outpatient Medications (Respiratory):    fexofenadine (ALLEGRA) 180 MG tablet, Take 180 mg by mouth daily.   Current Outpatient Medications (Hematological):    Cyanocobalamin  (B-12) 2500 MCG TABS, Take 2,500 mcg by mouth daily.  Current Outpatient Medications (Other):    azithromycin  (ZITHROMAX  Z-PAK) 250 MG tablet, As directed (Patient not taking: Reported on 03/19/2023)   Calcium Carb-Cholecalciferol (CALCIUM + D3 PO), Take by mouth.  Calcium 600mg  & vit d3 2000 iu   citalopram  (CELEXA ) 20 MG tablet, Take 1 tablet (20 mg total) by mouth daily.   CRANBERRY EXTRACT PO, Take 1 capsule by mouth daily.    D-Mannose 500 MG CAPS, Take 500 mg by mouth daily.   diphenoxylate -atropine  (LOMOTIL ) 2.5-0.025 MG tablet, Take 1-2 tablets by mouth 4 (four) times daily as needed for diarrhea or loose stools.   hydrocortisone  (ANUSOL -HC) 25 MG suppository, Place 1 suppository (25 mg total) rectally 2 (two) times daily as needed for hemorrhoids or anal itching.  Multiple Vitamin (MULTIVITAMIN) tablet, Take 1 tablet by mouth daily.   NON FORMULARY, Take 1 capsule by mouth at bedtime. Tart cherry extract   ondansetron  (ZOFRAN ) 4 MG tablet, Take 1 tablet (4 mg total) by mouth every 8 (eight) hours as needed for nausea or vomiting.   pantoprazole  (PROTONIX ) 40 MG tablet, Take 1 tablet (40 mg total) by mouth daily.   Probiotic Product (PROBIOTIC PO), Take by mouth. 60 billion   traZODone  (DESYREL ) 50 MG tablet, Take 1 tablet (50 mg total) by mouth at bedtime.   triamcinolone  ointment (KENALOG ) 0.5 %, Use qid prn   trimethoprim -polymyxin b  (POLYTRIM ) ophthalmic solution, Place 1 drop into the left eye every 6 (six) hours.   TURMERIC PO, Take 1 capsule by mouth daily.   UNABLE TO FIND, Med Name: nutrafol   valACYclovir  (VALTREX ) 500 MG tablet, Take 1 tablet (500 mg total) by mouth 2 (two) times daily.   Valerian Root 100 MG CAPS, Take 100 mg by mouth at bedtime.   Reviewed prior external information including notes and imaging from  primary care provider As well as notes that were available from care everywhere and other healthcare systems.  Past medical history, social, surgical and family history all reviewed in electronic medical record.  No pertanent information unless stated regarding to the chief complaint.   Review of Systems:  No headache, visual changes, nausea, vomiting, diarrhea, constipation, dizziness, abdominal pain, skin rash,  fevers, chills, night sweats, weight loss, swollen lymph nodes, body aches, joint swelling, chest pain, shortness of breath, mood changes. POSITIVE muscle aches  Objective  There were no vitals taken for this visit.   General: No apparent distress alert and oriented x3 mood and affect normal, dressed appropriately.  HEENT: Pupils equal, extraocular movements intact  Respiratory: Patient's speak in full sentences and does not appear short of breath  Cardiovascular: No lower extremity edema, non tender, no erythema  Antalgic gait Low back exam shows does have some mild loss lordosis.  Mild scoliosis noted. Right leg exam shows tenderness to palpation more over the lateral aspect of the tibia.  Patient has some mild pain in the calf with compression.  No significant swelling noted.  Limited muscular skeletal ultrasound was performed and interpreted by Ellen [name], M  Limited ultrasound shows hypoechoic changes and some cortical irregularity noted of the anterior lateral aspect of the tibia.  Lateral meniscus does have some degenerative changes noted but no significant displacement.  Hypoechoic changes of the patellofemoral joint noted.     Impression and Recommendations:     The above documentation has been reviewed and is accurate and complete Tyge Somers M Hadi Dubin, DO

## 2023-05-05 ENCOUNTER — Ambulatory Visit (INDEPENDENT_AMBULATORY_CARE_PROVIDER_SITE_OTHER)

## 2023-05-05 ENCOUNTER — Other Ambulatory Visit: Payer: Self-pay | Admitting: Internal Medicine

## 2023-05-05 ENCOUNTER — Other Ambulatory Visit: Payer: Self-pay

## 2023-05-05 ENCOUNTER — Encounter: Payer: Self-pay | Admitting: Family Medicine

## 2023-05-05 ENCOUNTER — Ambulatory Visit: Payer: Medicare Other | Admitting: Family Medicine

## 2023-05-05 VITALS — BP 122/84 | HR 55 | Ht 62.0 in

## 2023-05-05 DIAGNOSIS — M170 Bilateral primary osteoarthritis of knee: Secondary | ICD-10-CM | POA: Diagnosis not present

## 2023-05-05 DIAGNOSIS — M1711 Unilateral primary osteoarthritis, right knee: Secondary | ICD-10-CM | POA: Diagnosis not present

## 2023-05-05 DIAGNOSIS — M25561 Pain in right knee: Secondary | ICD-10-CM

## 2023-05-05 NOTE — Patient Instructions (Addendum)
 [facility] talk injections after

## 2023-05-05 NOTE — Assessment & Plan Note (Signed)
 Known arthritic changes been having worsening right knee pain.  Starting to affect daily activities.  Waking up at night.  Unable to do much more than her biking.  Patient did have a fall right on the kneecap.  Concern for medial compartment knee AVN or tibial plateau fracture.  X-rays are nonconclusive.  If anything nondisplaced.  Do feel with the amount of pain and limited range of motion to get a MRI for further evaluation.  Patient is a highly active individual and expect her to continue to be for the next 25 years.  Follow-up after imaging to discuss further treatment options

## 2023-05-07 ENCOUNTER — Ambulatory Visit (HOSPITAL_BASED_OUTPATIENT_CLINIC_OR_DEPARTMENT_OTHER)
Admission: RE | Admit: 2023-05-07 | Discharge: 2023-05-07 | Disposition: A | Discharge status: Home / Self Care | Source: Ambulatory Visit | Attending: Family Medicine | Admitting: Family Medicine

## 2023-05-07 DIAGNOSIS — M25561 Pain in right knee: Secondary | ICD-10-CM | POA: Diagnosis not present

## 2023-05-07 DIAGNOSIS — M7121 Synovial cyst of popliteal space [Baker], right knee: Secondary | ICD-10-CM | POA: Diagnosis not present

## 2023-05-07 DIAGNOSIS — S83281A Other tear of lateral meniscus, current injury, right knee, initial encounter: Secondary | ICD-10-CM | POA: Diagnosis not present

## 2023-05-07 DIAGNOSIS — M25461 Effusion, right knee: Secondary | ICD-10-CM | POA: Diagnosis not present

## 2023-05-11 ENCOUNTER — Encounter: Payer: Self-pay | Admitting: Family Medicine

## 2023-05-15 ENCOUNTER — Encounter (HOSPITAL_COMMUNITY): Payer: Self-pay

## 2023-05-20 ENCOUNTER — Ambulatory Visit: Admitting: Internal Medicine

## 2023-05-22 ENCOUNTER — Encounter: Payer: Self-pay | Admitting: Physician Assistant

## 2023-05-22 ENCOUNTER — Encounter: Payer: Self-pay | Admitting: Internal Medicine

## 2023-05-22 ENCOUNTER — Ambulatory Visit (INDEPENDENT_AMBULATORY_CARE_PROVIDER_SITE_OTHER): Admitting: Internal Medicine

## 2023-05-22 VITALS — BP 122/76 | HR 59 | Temp 97.6°F | Ht 62.0 in | Wt 141.8 lb

## 2023-05-22 DIAGNOSIS — R413 Other amnesia: Secondary | ICD-10-CM

## 2023-05-22 DIAGNOSIS — E538 Deficiency of other specified B group vitamins: Secondary | ICD-10-CM | POA: Diagnosis not present

## 2023-05-22 DIAGNOSIS — E785 Hyperlipidemia, unspecified: Secondary | ICD-10-CM | POA: Diagnosis not present

## 2023-05-22 LAB — CBC WITH DIFFERENTIAL/PLATELET
Basophils Absolute: 0.1 10*3/uL (ref 0.0–0.1)
Basophils Relative: 1 % (ref 0.0–3.0)
Eosinophils Absolute: 0.3 10*3/uL (ref 0.0–0.7)
Eosinophils Relative: 5.6 % — ABNORMAL HIGH (ref 0.0–5.0)
HCT: 42.9 % (ref 36.0–46.0)
Hemoglobin: 14.1 g/dL (ref 12.0–15.0)
Lymphocytes Relative: 39.4 % (ref 12.0–46.0)
Lymphs Abs: 2.3 10*3/uL (ref 0.7–4.0)
MCHC: 32.8 g/dL (ref 30.0–36.0)
MCV: 96.3 fl (ref 78.0–100.0)
Monocytes Absolute: 0.5 10*3/uL (ref 0.1–1.0)
Monocytes Relative: 8.8 % (ref 3.0–12.0)
Neutro Abs: 2.7 10*3/uL (ref 1.4–7.7)
Neutrophils Relative %: 45.2 % (ref 43.0–77.0)
Platelets: 228 10*3/uL (ref 150.0–400.0)
RBC: 4.46 Mil/uL (ref 3.87–5.11)
RDW: 13.2 % (ref 11.5–15.5)
WBC: 5.9 10*3/uL (ref 4.0–10.5)

## 2023-05-22 LAB — URINALYSIS
Bilirubin Urine: NEGATIVE
Hgb urine dipstick: NEGATIVE
Ketones, ur: NEGATIVE
Leukocytes,Ua: NEGATIVE
Nitrite: NEGATIVE
Specific Gravity, Urine: 1.01 (ref 1.000–1.030)
Total Protein, Urine: NEGATIVE
Urine Glucose: NEGATIVE
Urobilinogen, UA: 0.2 (ref 0.0–1.0)
pH: 7.5 (ref 5.0–8.0)

## 2023-05-22 LAB — COMPREHENSIVE METABOLIC PANEL WITH GFR
ALT: 18 U/L (ref 0–35)
AST: 25 U/L (ref 0–37)
Albumin: 4.3 g/dL (ref 3.5–5.2)
Alkaline Phosphatase: 55 U/L (ref 39–117)
BUN: 14 mg/dL (ref 6–23)
CO2: 28 meq/L (ref 19–32)
Calcium: 9.2 mg/dL (ref 8.4–10.5)
Chloride: 104 meq/L (ref 96–112)
Creatinine, Ser: 0.77 mg/dL (ref 0.40–1.20)
GFR: 75.86 mL/min (ref >60.00)
Glucose, Bld: 91 mg/dL (ref 70–99)
Potassium: 4.3 meq/L (ref 3.5–5.1)
Sodium: 140 meq/L (ref 135–145)
Total Bilirubin: 0.6 mg/dL (ref 0.2–1.2)
Total Protein: 7 g/dL (ref 6.0–8.3)

## 2023-05-22 NOTE — Progress Notes (Signed)
 Subjective:  Patient ID: Ellen [name], female    DOB: [birth year]  Age: 72 y.o. MRN: [id number]  CC: Memory Loss (Short term and mid term memory issues for the past year. Recalling events within short spans has become a problem. Has not sought a specialist for this yet. Has since stopped drinking alcohol. Patient does extremely well at long term recall)   HPI  Ellen [name] presents for  memory Loss (Short term and mid term memory issues for the past year. Recalling events within short spans has become a problem. Has not sought a specialist for this yet. Has since stopped drinking alcohol. Patient does extremely well at long term recall.  F/u on dyslipidemia, B 12 def Mother and GM had memory loss  Ellen [name] is here w/Ellen [name]     Outpatient Medications Prior to Visit  Medication Sig Dispense Refill   Calcium Carb-Cholecalciferol (CALCIUM + D3 PO) Take by mouth. Calcium 600mg  & vit d3 2000 iu     citalopram  (CELEXA ) 20 MG tablet Take 1 tablet (20 mg total) by mouth daily. 90 tablet 3   CRANBERRY EXTRACT PO Take 1 capsule by mouth daily.      Cyanocobalamin  (B-12) 2500 MCG TABS Take 2,500 mcg by mouth daily.     D-Mannose 500 MG CAPS Take 500 mg by mouth daily.     diphenoxylate -atropine  (LOMOTIL ) 2.5-0.025 MG tablet Take 1-2 tablets by mouth 4 (four) times daily as needed for diarrhea or loose stools. 60 tablet 2   estradiol  (CLIMARA  - DOSED IN MG/24 HR) 0.05 mg/24hr patch APPLY 1 PATCH TOPICALLY ONCE A WEEK 4 patch 0   fexofenadine (ALLEGRA) 180 MG tablet Take 180 mg by mouth daily.     hydrocortisone  (ANUSOL -HC) 25 MG suppository Place 1 suppository (25 mg total) rectally 2 (two) times daily as needed for hemorrhoids or anal itching. 20 suppository 1   levothyroxine  (SYNTHROID ) 125 MCG tablet Take 1 tablet (125 mcg total) by mouth daily. 90 tablet 3   Multiple Vitamin (MULTIVITAMIN) tablet Take 1 tablet by mouth daily.     NON FORMULARY Take 1 capsule by mouth at bedtime. Tart cherry  extract     ondansetron  (ZOFRAN ) 4 MG tablet Take 1 tablet (4 mg total) by mouth every 8 (eight) hours as needed for nausea or vomiting. 20 tablet 0   pantoprazole  (PROTONIX ) 40 MG tablet Take 1 tablet (40 mg total) by mouth daily. 90 tablet 3   Probiotic Product (PROBIOTIC PO) Take by mouth. 60 billion     traZODone  (DESYREL ) 50 MG tablet Take 1 tablet (50 mg total) by mouth at bedtime. 90 tablet 3   triamcinolone  ointment (KENALOG ) 0.5 % Use qid prn 90 g 3   TURMERIC PO Take 1 capsule by mouth daily.     UNABLE TO FIND Med Name: nutrafol     valACYclovir  (VALTREX ) 500 MG tablet Take 1 tablet (500 mg total) by mouth 2 (two) times daily. 14 tablet 2   Valerian Root 100 MG CAPS Take 100 mg by mouth at bedtime.     azithromycin  (ZITHROMAX  Z-PAK) 250 MG tablet As directed (Patient not taking: Reported on 05/22/2023) 6 tablet 0   trimethoprim -polymyxin b  (POLYTRIM ) ophthalmic solution Place 1 drop into the left eye every 6 (six) hours. (Patient not taking: Reported on 05/22/2023) 10 mL 0   No facility-administered medications prior to visit.    ROS: Review of Systems  Constitutional:  Negative for activity change, appetite change, chills, fatigue and unexpected  weight change.  HENT:  Negative for congestion, mouth sores and sinus pressure.   Eyes:  Negative for visual disturbance.  Respiratory:  Negative for cough and chest tightness.   Gastrointestinal:  Negative for abdominal pain and nausea.  Genitourinary:  Negative for difficulty urinating, frequency and vaginal pain.  Musculoskeletal:  Negative for back pain and gait problem.  Skin:  Negative for pallor and rash.  Neurological:  Negative for dizziness, tremors, weakness, numbness and headaches.  Psychiatric/Behavioral:  Positive for decreased concentration. Negative for confusion, sleep disturbance and suicidal ideas.     Objective:  BP 122/76   Pulse (!) 59   Temp 97.6 F (36.4 C)   Ht 5\' 2"  (1.575 m)   Wt 141 lb 12.8 oz (64.3  kg)   SpO2 98%   BMI 25.94 kg/m   BP Readings from Last 3 Encounters:  05/22/23 122/76  05/05/23 122/84  04/01/23 (!) 157/70    Wt Readings from Last 3 Encounters:  05/22/23 141 lb 12.8 oz (64.3 kg)  03/19/23 148 lb (67.1 kg)  03/06/23 139 lb 12.8 oz (63.4 kg)    Physical Exam Constitutional:      General: She is not in acute distress.    Appearance: She is well-developed.  HENT:     Head: Normocephalic.     Right Ear: External ear normal.     Left Ear: External ear normal.     Nose: Nose normal.  Eyes:     General:        Right eye: No discharge.        Left eye: No discharge.     Conjunctiva/sclera: Conjunctivae normal.     Pupils: Pupils are equal, round, and reactive to light.  Neck:     Thyroid : No thyromegaly.     Vascular: No JVD.     Trachea: No tracheal deviation.  Cardiovascular:     Rate and Rhythm: Normal rate and regular rhythm.     Heart sounds: Normal heart sounds.  Pulmonary:     Effort: No respiratory distress.     Breath sounds: No stridor. No wheezing.  Abdominal:     General: Bowel sounds are normal. There is no distension.     Palpations: Abdomen is soft. There is no mass.     Tenderness: There is no abdominal tenderness. There is no guarding or rebound.  Musculoskeletal:        General: No tenderness.     Cervical back: Normal range of motion and neck supple. No rigidity.     Right lower leg: No edema.     Left lower leg: No edema.  Lymphadenopathy:     Cervical: No cervical adenopathy.  Skin:    Findings: No erythema or rash.  Neurological:     Mental Status: She is oriented to person, place, and time.     Cranial Nerves: No cranial nerve deficit.     Motor: No abnormal muscle tone.     Coordination: Coordination normal.     Deep Tendon Reflexes: Reflexes normal.  Psychiatric:        Behavior: Behavior normal.        Thought Content: Thought content normal.        Judgment: Judgment normal.     Lab Results  Component Value  Date   WBC 6.7 02/03/2023   HGB 14.1 02/03/2023   HCT 42.4 02/03/2023   PLT 218.0 02/03/2023   GLUCOSE 82 02/03/2023   CHOL 193 03/05/2022  TRIG 153.0 (H) 03/05/2022   HDL 55.40 03/05/2022   LDLDIRECT 142.6 07/09/2011   LDLCALC 107 (H) 03/05/2022   ALT 15 02/03/2023   AST 26 02/03/2023   NA 137 02/03/2023   K 4.6 02/03/2023   CL 106 02/03/2023   CREATININE 0.69 02/03/2023   BUN 9 02/03/2023   CO2 24 02/03/2023   TSH 1.19 05/28/2022   INR 0.9 10/16/2020   HGBA1C 5.6 10/12/2006    No results found.  Assessment & Plan:   Problem List Items Addressed This Visit     Dyslipidemia   Relevant Orders   Comprehensive metabolic panel with GFR   CBC with Differential/Platelet   Memory change - Primary   Lion's Mane mushroom Ginko Biloba Vitamin B complex  Clock drawing - perfect Recalls 3/3 in 0-5-10 min  Not drinking at all now Head CT Neurology ref Check labs      Relevant Orders   Comprehensive metabolic panel with GFR   Vitamin B12   Urinalysis   TSH   CBC with Differential/Platelet   CT HEAD WO CONTRAST ( )   Ambulatory referral to Neurology   B12 deficiency   On B12      Relevant Orders   Vitamin B12      No orders of the defined types were placed in this encounter.     Follow-up: Return in about 6 months (around 11/22/2023) for a follow-up visit.  Anitra Barn, MD

## 2023-05-22 NOTE — Assessment & Plan Note (Addendum)
 Lion's Mane mushroom Ginko Biloba Vitamin B complex  Clock drawing - perfect Recalls 3/3 in 0-5-10 min  Not drinking at all now [facility] CT Neurology ref Check labs

## 2023-05-22 NOTE — Patient Instructions (Signed)
 Lion's Mane mushroom Ginko Biloba Vitamin B complex

## 2023-05-22 NOTE — Assessment & Plan Note (Signed)
 On B12

## 2023-05-23 ENCOUNTER — Encounter: Payer: Self-pay | Admitting: Internal Medicine

## 2023-05-25 ENCOUNTER — Other Ambulatory Visit: Payer: Self-pay

## 2023-05-25 ENCOUNTER — Encounter: Payer: Self-pay | Admitting: Family Medicine

## 2023-05-25 ENCOUNTER — Ambulatory Visit: Payer: Self-pay | Admitting: Family Medicine

## 2023-05-25 DIAGNOSIS — M170 Bilateral primary osteoarthritis of knee: Secondary | ICD-10-CM

## 2023-05-25 DIAGNOSIS — M23300 Other meniscus derangements, unspecified lateral meniscus, right knee: Secondary | ICD-10-CM

## 2023-05-26 ENCOUNTER — Ambulatory Visit: Payer: Self-pay | Admitting: Internal Medicine

## 2023-05-26 LAB — TSH: TSH: 0.41 u[IU]/mL (ref 0.35–5.50)

## 2023-05-26 LAB — VITAMIN B12: Vitamin B-12: 170 pg/mL — ABNORMAL LOW (ref 211–911)

## 2023-05-26 MED ORDER — B-12 5000 MCG SL SUBL: SUBLINGUAL_TABLET | SUBLINGUAL | 1 refills | Status: DC

## 2023-05-28 ENCOUNTER — Telehealth: Payer: Self-pay | Admitting: Family Medicine

## 2023-05-28 NOTE — Telephone Encounter (Signed)
 Patient called after hours and left a message to let Dr Felipe Horton know that she is scheduled with Dr Murrell Arrant on June 3rd.  She also mentioned that her recent labs from Dr Georgia Kipper showed that her B12 was still low. She said she may start B12 injections but will be doing to DC today for her son who is having surgery.

## 2023-05-29 ENCOUNTER — Other Ambulatory Visit: Payer: Self-pay | Admitting: Internal Medicine

## 2023-06-02 ENCOUNTER — Telehealth: Payer: Self-pay | Admitting: Internal Medicine

## 2023-06-02 NOTE — Telephone Encounter (Signed)
 Copied from CRM [id number]. Topic: General - Other >> Jun 02, 2023  1:59 PM Emylou G wrote: Reason for CRM: Jun 04 2023 10:00 AM - Gi Ct 20 Min [facility] - [facility] [place] [address]  [place] w/Imaging asked if this appt requires prior auth??

## 2023-06-04 ENCOUNTER — Ambulatory Visit
Admission: RE | Admit: 2023-06-04 | Discharge: 2023-06-04 | Discharge status: Home / Self Care | Source: Ambulatory Visit | Attending: Internal Medicine | Admitting: Internal Medicine

## 2023-06-04 DIAGNOSIS — M1711 Unilateral primary osteoarthritis, right knee: Secondary | ICD-10-CM | POA: Diagnosis not present

## 2023-06-04 DIAGNOSIS — I6782 Cerebral ischemia: Secondary | ICD-10-CM | POA: Diagnosis not present

## 2023-06-04 DIAGNOSIS — R413 Other amnesia: Secondary | ICD-10-CM

## 2023-06-04 DIAGNOSIS — G319 Degenerative disease of nervous system, unspecified: Secondary | ICD-10-CM | POA: Diagnosis not present

## 2023-06-04 DIAGNOSIS — R4182 Altered mental status, unspecified: Secondary | ICD-10-CM | POA: Diagnosis not present

## 2023-06-09 ENCOUNTER — Other Ambulatory Visit: Payer: Self-pay | Admitting: Internal Medicine

## 2023-06-09 MED ORDER — POLYMYXIN B-TRIMETHOPRIM 10000-0.1 UNIT/ML-% OP SOLN: 1.0000 [drp] | Freq: Four times a day (QID) | OPHTHALMIC | 0 refills | Status: AC

## 2023-06-11 ENCOUNTER — Other Ambulatory Visit: Payer: Self-pay | Admitting: Internal Medicine

## 2023-06-11 MED ORDER — ESTRADIOL 0.025 MG/24HR TD PTWK: 0.0250 mg | MEDICATED_PATCH | TRANSDERMAL | 12 refills | Status: DC

## 2023-06-11 NOTE — Progress Notes (Signed)
 Pt wants to use Climara  0.025 dose
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
## 2023-06-15 ENCOUNTER — Ambulatory Visit

## 2023-06-15 ENCOUNTER — Encounter: Payer: Self-pay | Admitting: Internal Medicine

## 2023-06-15 ENCOUNTER — Encounter: Payer: Self-pay | Admitting: Physician Assistant

## 2023-06-15 ENCOUNTER — Ambulatory Visit: Admitting: Physician Assistant

## 2023-06-15 ENCOUNTER — Other Ambulatory Visit

## 2023-06-15 VITALS — BP 110/73 | HR 84 | Resp 20 | Ht 62.0 in | Wt 142.0 lb

## 2023-06-15 DIAGNOSIS — R413 Other amnesia: Secondary | ICD-10-CM | POA: Diagnosis not present

## 2023-06-15 NOTE — Patient Instructions (Signed)
 It was a pleasure to see you today at our office.   Recommendations:  Neurocognitive evaluation at our office   MRI of the brain, the radiology office will call you to arrange you appointment   Check labs today B1  Follow up after Neuropsych  Recommend visiting the website : " [facility]ementia [facility]" to better understand some behaviors related to memory loss.  For psychiatric meds, mood meds: Please have your primary care physician manage these medications.  If you have any severe symptoms of a stroke, or other severe issues such as confusion,severe chills or fever, etc call 911 or go to the ER as you may need to be evaluated further For guidance regarding [facility] and if placement were needed at the facility, contact Social Worker tel: [phone]  For assessment of decision of mental capacity and competency:  Call Dr. Laverne Potter, geriatric psychiatrist at [phone] Counseling regarding caregiver distress, including caregiver depression, anxiety and issues regarding community resources, adult day care programs, adult living facilities, or memory care questions:  please contact your  Primary Doctor's Social Worker   FOR Memory  decline, memory medications: Call our office [phone]    [email or web address]   RECOMMENDATIONS FOR ALL PATIENTS WITH MEMORY PROBLEMS: 1. Continue to exercise (Recommend 30 minutes of walking everyday, or 3 hours every week) 2. Increase social interactions - continue going to [place] and enjoy social gatherings with friends and family 3. Eat healthy, avoid fried foods and eat more fruits and vegetables 4. Maintain adequate blood pressure, blood sugar, and blood cholesterol level. Reducing the risk of stroke and cardiovascular disease also helps promoting better memory. 5. Avoid stressful situations. Live a simple life and avoid aggravations. Organize your time and prepare for the next  day in anticipation. 6. Sleep well, avoid any interruptions of sleep and avoid any distractions in the bedroom that may interfere with adequate sleep quality 7. Avoid sugar, avoid sweets as there is a strong link between excessive sugar intake, diabetes, and cognitive impairment We discussed the Mediterranean diet, which has been shown to help patients reduce the risk of progressive memory disorders and reduces cardiovascular risk. This includes eating fish, eat fruits and green leafy vegetables, nuts like almonds and hazelnuts, walnuts, and also use olive oil. Avoid fast foods and fried foods as much as possible. Avoid sweets and sugar as sugar use has been linked to worsening of memory function.  There is always a concern of gradual progression of memory problems. If this is the case, then we may need to adjust level of care according to patient needs. Support, both to the patient and caregiver, should then be put into place.      You have been referred for a neuropsychological evaluation (i.e., evaluation of memory and thinking abilities). Please bring someone with you to this appointment if possible, as it is helpful for the doctor to hear from both you and another adult who knows you well. Please bring eyeglasses and hearing aids if you wear them.    The evaluation will take approximately 3 hours and has two parts:   The first part is a clinical interview with the neuropsychologist (Dr. Kitty Perkins or Dr. Donavon Fudge). During the interview, the neuropsychologist will speak with you and the individual you brought to the appointment.    The second part of the evaluation is testing with the doctor's technician Bernabe Brew or Burdette Carolin). During the testing, the technician will ask you to remember different types of material, solve problems,  and answer some questionnaires. Your family member will not be present for this portion of the evaluation.   Please note: We must reserve several hours of the neuropsychologist's time  and the psychometrician's time for your evaluation appointment. As such, there is a No-Show fee of $100. If you are unable to attend any of your appointments, please contact our office as soon as possible to reschedule.      DRIVING: Regarding driving, in patients with progressive memory problems, driving will be impaired. We advise to have someone else do the driving if trouble finding directions or if minor accidents are reported. Independent driving assessment is available to determine safety of driving.   If you are interested in the driving assessment, you can contact the following:  [employer] in [place] [phone]  [facility] [phone]  [facility] [phone]  [facility] [phone] or [phone]   FALL PRECAUTIONS: Be cautious when walking. Scan the area for obstacles that may increase the risk of trips and falls. When getting up in the mornings, sit up at the edge of the bed for a few minutes before getting out of bed. Consider elevating the bed at the head end to avoid drop of blood pressure when getting up. Walk always in a well-lit room (use night lights in the walls). Avoid area rugs or power cords from appliances in the middle of the walkways. Use a walker or a cane if necessary and consider physical therapy for balance exercise. Get your eyesight checked regularly.  FINANCIAL OVERSIGHT: Supervision, especially oversight when making financial decisions or transactions is also recommended.  HOME SAFETY: Consider the safety of the kitchen when operating appliances like stoves, microwave oven, and blender. Consider having supervision and share cooking responsibilities until no longer able to participate in those. Accidents with firearms and other hazards in the house should be identified and addressed as well.   ABILITY TO BE LEFT ALONE: If patient is unable to contact 911 operator, consider using LifeLine, or when  the need is there, arrange for someone to stay with patients. Smoking is a fire hazard, consider supervision or cessation. Risk of wandering should be assessed by caregiver and if detected at any point, supervision and safe proof recommendations should be instituted.  MEDICATION SUPERVISION: Inability to self-administer medication needs to be constantly addressed. Implement a mechanism to ensure safe administration of the medications.      Mediterranean Diet A Mediterranean diet refers to food and lifestyle choices that are based on the traditions of countries located on the [employer]. This way of eating has been shown to help prevent certain conditions and improve outcomes for people who have chronic diseases, like kidney disease and heart disease. What are tips for following this plan? Lifestyle  Cook and eat meals together with your family, when possible. Drink enough fluid to keep your urine clear or pale yellow. Be physically active every day. This includes: Aerobic exercise like running or swimming. Leisure activities like gardening, walking, or housework. Get 7-8 hours of sleep each night. If recommended by your health care provider, drink red wine in moderation. This means 1 glass a day for nonpregnant women and 2 glasses a day for men. A glass of wine equals 5 oz (150 mL). Reading food labels  Check the serving size of packaged foods. For foods such as rice and pasta, the serving size refers to the amount of cooked product, not dry. Check the total fat in packaged foods. Avoid foods  that have saturated fat or trans fats. Check the ingredients list for added sugars, such as corn syrup. Shopping  At the grocery store, buy most of your food from the areas near the walls of the store. This includes: Fresh fruits and vegetables (produce). Grains, beans, nuts, and seeds. Some of these may be available in unpackaged forms or large amounts (in bulk). Fresh seafood. Poultry and  eggs. Low-fat dairy products. Buy whole ingredients instead of prepackaged foods. Buy fresh fruits and vegetables in-season from local farmers markets. Buy frozen fruits and vegetables in resealable bags. If you do not have access to quality fresh seafood, buy precooked frozen shrimp or canned fish, such as tuna, salmon, or sardines. Buy small amounts of raw or cooked vegetables, salads, or olives from the deli or salad bar at your store. Stock your pantry so you always have certain foods on hand, such as olive oil, canned tuna, canned tomatoes, rice, pasta, and beans. Cooking  Cook foods with extra-virgin olive oil instead of using butter or other vegetable oils. Have meat as a side dish, and have vegetables or grains as your main dish. This means having meat in small portions or adding small amounts of meat to foods like pasta or stew. Use beans or vegetables instead of meat in common dishes like chili or lasagna. Experiment with different cooking methods. Try roasting or broiling vegetables instead of steaming or sauteing them. Add frozen vegetables to soups, stews, pasta, or rice. Add nuts or seeds for added healthy fat at each meal. You can add these to yogurt, salads, or vegetable dishes. Marinate fish or vegetables using olive oil, lemon juice, garlic, and fresh herbs. Meal planning  Plan to eat 1 vegetarian meal one day each week. Try to work up to 2 vegetarian meals, if possible. Eat seafood 2 or more times a week. Have healthy snacks readily available, such as: Vegetable sticks with hummus. Greek yogurt. Fruit and nut trail mix. Eat balanced meals throughout the week. This includes: Fruit: 2-3 servings a day Vegetables: 4-5 servings a day Low-fat dairy: 2 servings a day Fish, poultry, or lean meat: 1 serving a day Beans and legumes: 2 or more servings a week Nuts and seeds: 1-2 servings a day Whole grains: 6-8 servings a day Extra-virgin olive oil: 3-4 servings a day Limit  red meat and sweets to only a few servings a month What are my food choices? Mediterranean diet Recommended Grains: Whole-grain pasta. Brown rice. Bulgar wheat. Polenta. Couscous. Whole-wheat bread. Dwyane Glad. Vegetables: Artichokes. Beets. Broccoli. Cabbage. Carrots. Eggplant. Green beans. Chard. Kale. Spinach. Onions. Leeks. Peas. Squash. Tomatoes. Peppers. Radishes. Fruits: Apples. Apricots. Avocado. Berries. Bananas. Cherries. Dates. Figs. Grapes. Lemons. Melon. Oranges. Peaches. Plums. Pomegranate. Meats and other protein foods: Beans. Almonds. Sunflower seeds. Pine nuts. Peanuts. Cod. Salmon. Scallops. Shrimp. Tuna. Tilapia. Clams. Oysters. Eggs. Dairy: Low-fat milk. Cheese. Greek yogurt. Beverages: Water . Red wine. Herbal tea. Fats and oils: Extra virgin olive oil. Avocado oil. Grape seed oil. Sweets and desserts: [place] yogurt with honey. Baked apples. Poached pears. Trail mix. Seasoning and other foods: Basil. Cilantro. Coriander. Cumin. Mint. Parsley. Sage. Rosemary. Tarragon. Garlic. Oregano. Thyme. Pepper. Balsalmic vinegar. Tahini. Hummus. Tomato sauce. Olives. Mushrooms. Limit these Grains: Prepackaged pasta or rice dishes. Prepackaged cereal with added sugar. Vegetables: Deep fried potatoes (french fries). Fruits: Fruit canned in syrup. Meats and other protein foods: Beef. Pork. Lamb. Poultry with skin. Hot dogs. Helene Loader. Dairy: Ice cream. Sour cream. Whole milk. Beverages: Juice. Sugar-sweetened soft drinks.  Beer. Liquor and spirits. Fats and oils: Butter. Canola oil. Vegetable oil. Beef fat (tallow). Lard. Sweets and desserts: Cookies. Cakes. Pies. Candy. Seasoning and other foods: Mayonnaise. Premade sauces and marinades. The items listed may not be a complete list. Talk with your dietitian about what dietary choices are right for you. Summary The Mediterranean diet includes both food and lifestyle choices. Eat a variety of fresh fruits and vegetables, beans, nuts,  seeds, and whole grains. Limit the amount of red meat and sweets that you eat. Talk with your health care provider about whether it is safe for you to drink red wine in moderation. This means 1 glass a day for nonpregnant women and 2 glasses a day for men. A glass of wine equals 5 oz (150 mL). This information is not intended to replace advice given to you by your health care provider. Make sure you discuss any questions you have with your health care provider. Document Released: 08/16/2015 Document Revised: 09/18/2015 Document Reviewed: 08/16/2015 Elsevier Interactive Patient Education  2017 [employer].

## 2023-06-16 ENCOUNTER — Ambulatory Visit (INDEPENDENT_AMBULATORY_CARE_PROVIDER_SITE_OTHER): Admitting: Internal Medicine

## 2023-06-16 ENCOUNTER — Encounter: Payer: Self-pay | Admitting: Internal Medicine

## 2023-06-16 VITALS — BP 102/78 | HR 75 | Temp 98.4°F | Ht 62.0 in | Wt 139.0 lb

## 2023-06-16 DIAGNOSIS — R55 Syncope and collapse: Secondary | ICD-10-CM

## 2023-06-16 DIAGNOSIS — T675XXA Heat exhaustion, unspecified, initial encounter: Secondary | ICD-10-CM | POA: Diagnosis not present

## 2023-06-16 DIAGNOSIS — E034 Atrophy of thyroid (acquired): Secondary | ICD-10-CM | POA: Diagnosis not present

## 2023-06-16 MED ORDER — CEPHALEXIN 500 MG PO CAPS: 500.0000 mg | ORAL_CAPSULE | Freq: Four times a day (QID) | ORAL | 0 refills | Status: DC

## 2023-06-16 NOTE — Assessment & Plan Note (Signed)
Cont w/LEVOTHROID 112 MCG/D

## 2023-06-16 NOTE — Progress Notes (Signed)
 Subjective:  Patient ID: Ellen [name], female    DOB: 05/07/1950  Age: 73 y.o. MRN: [id number]  CC: Medical Management of Chronic Issues (30- 1 hour of body aches, vomiting and fever yesterday... Syncope episode on 06/15/2023 as well. Needing a rx for cephalexin  when traveling due to possible insects bites while [place] seas)   HPI Rudell L Sumler presents for a passing out spell.Pt had 1 hour of body aches, vomiting and fever yesterday. Syncope episode on 06/15/2023 as well in am.  Needing a rx for cephalexin  when traveling due to possible insects bites while [place] seas)  Outpatient Medications Prior to Visit  Medication Sig Dispense Refill   Calcium Carb-Cholecalciferol (CALCIUM + D3 PO) Take by mouth. Calcium 600mg  & vit d3 2000 iu     citalopram  (CELEXA ) 20 MG tablet Take 1 tablet (20 mg total) by mouth daily. 90 tablet 3   CRANBERRY EXTRACT PO Take 1 capsule by mouth daily.      Cyanocobalamin  (B-12) 5000 MCG SUBL 1 tab sl qd 100 tablet 1   D-Mannose 500 MG CAPS Take 500 mg by mouth daily.     diphenoxylate -atropine  (LOMOTIL ) 2.5-0.025 MG tablet Take 1-2 tablets by mouth 4 (four) times daily as needed for diarrhea or loose stools. 60 tablet 2   estradiol  (CLIMARA ) 0.025 mg/24hr patch Place 1 patch (0.025 mg total) onto the skin once a week. 4 patch 12   fexofenadine (ALLEGRA) 180 MG tablet Take 180 mg by mouth daily.     hydrocortisone  (ANUSOL -HC) 25 MG suppository Place 1 suppository (25 mg total) rectally 2 (two) times daily as needed for hemorrhoids or anal itching. 20 suppository 1   levothyroxine  (SYNTHROID ) 125 MCG tablet Take 1 tablet (125 mcg total) by mouth daily. 90 tablet 3   Multiple Vitamin (MULTIVITAMIN) tablet Take 1 tablet by mouth daily.     NON FORMULARY Take 1 capsule by mouth at bedtime. Tart cherry extract     ondansetron  (ZOFRAN ) 4 MG tablet Take 1 tablet (4 mg total) by mouth every 8 (eight) hours as needed for nausea or vomiting. 20 tablet 0   pantoprazole   (PROTONIX ) 40 MG tablet Take 1 tablet (40 mg total) by mouth daily. 90 tablet 3   Probiotic Product (PROBIOTIC PO) Take by mouth. 60 billion     traZODone  (DESYREL ) 50 MG tablet Take 1 tablet (50 mg total) by mouth at bedtime. 90 tablet 3   triamcinolone  ointment (KENALOG ) 0.5 % Use qid prn 90 g 3   trimethoprim -polymyxin b  (POLYTRIM ) ophthalmic solution Place 1 drop into the left eye every 6 (six) hours. 10 mL 0   TURMERIC PO Take 1 capsule by mouth daily.     UNABLE TO FIND Med Name: nutrafol     valACYclovir  (VALTREX ) 500 MG tablet Take 1 tablet (500 mg total) by mouth 2 (two) times daily. 14 tablet 2   Valerian Root 100 MG CAPS Take 100 mg by mouth at bedtime.     azithromycin  (ZITHROMAX  Z-PAK) 250 MG tablet As directed (Patient not taking: Reported on 06/16/2023) 6 tablet 0   No facility-administered medications prior to visit.    ROS: Review of Systems  Constitutional:  Positive for fatigue. Negative for activity change, appetite change, chills and unexpected weight change.  HENT:  Negative for congestion, mouth sores, sinus pressure and voice change.   Eyes:  Negative for visual disturbance.  Respiratory:  Negative for cough and chest tightness.   Gastrointestinal:  Negative for abdominal pain  and nausea.  Genitourinary:  Negative for difficulty urinating, frequency and vaginal pain.  Musculoskeletal:  Positive for arthralgias. Negative for back pain and gait problem.  Skin:  Negative for pallor and rash.  Neurological:  Negative for dizziness, tremors, weakness, numbness and headaches.  Psychiatric/Behavioral:  Negative for confusion, sleep disturbance and suicidal ideas.     Objective:  BP 102/78   Pulse 75   Temp 98.4 F (36.9 C) (Oral)   Ht 5\' 2"  (1.575 m)   Wt 139 lb (63 kg)   SpO2 97%   BMI 25.42 kg/m   BP Readings from Last 3 Encounters:  06/16/23 102/78  06/15/23 110/73  05/22/23 122/76    Wt Readings from Last 3 Encounters:  06/16/23 139 lb (63 kg)   06/15/23 142 lb (64.4 kg)  05/22/23 141 lb 12.8 oz (64.3 kg)    Physical Exam Constitutional:      General: She is not in acute distress.    Appearance: She is well-developed. She is obese.  HENT:     Head: Normocephalic.     Right Ear: External ear normal.     Left Ear: External ear normal.     Nose: Nose normal.  Eyes:     General:        Right eye: No discharge.        Left eye: No discharge.     Conjunctiva/sclera: Conjunctivae normal.     Pupils: Pupils are equal, round, and reactive to light.  Neck:     Thyroid : No thyromegaly.     Vascular: No JVD.     Trachea: No tracheal deviation.  Cardiovascular:     Rate and Rhythm: Normal rate and regular rhythm.     Heart sounds: Normal heart sounds.  Pulmonary:     Effort: No respiratory distress.     Breath sounds: No stridor. No wheezing.  Abdominal:     General: Bowel sounds are normal. There is no distension.     Palpations: Abdomen is soft. There is no mass.     Tenderness: There is no abdominal tenderness. There is no guarding or rebound.  Musculoskeletal:        General: No tenderness.     Cervical back: Normal range of motion and neck supple. No rigidity.     Right lower leg: No edema.     Left lower leg: No edema.  Lymphadenopathy:     Cervical: No cervical adenopathy.  Skin:    Findings: No erythema or rash.  Neurological:     Mental Status: She is oriented to person, place, and time.     Cranial Nerves: No cranial nerve deficit.     Motor: No abnormal muscle tone.     Coordination: Coordination normal.     Deep Tendon Reflexes: Reflexes normal.  Psychiatric:        Behavior: Behavior normal.        Thought Content: Thought content normal.        Judgment: Judgment normal.     Lab Results  Component Value Date   WBC 5.9 05/22/2023   HGB 14.1 05/22/2023   HCT 42.9 05/22/2023   PLT 228.0 05/22/2023   GLUCOSE 91 05/22/2023   CHOL 193 03/05/2022   TRIG 153.0 (H) 03/05/2022   HDL 55.40 03/05/2022    LDLDIRECT 142.6 07/09/2011   LDLCALC 107 (H) 03/05/2022   ALT 18 05/22/2023   AST 25 05/22/2023   NA 140 05/22/2023   K 4.3 05/22/2023  CL 104 05/22/2023   CREATININE 0.77 05/22/2023   BUN 14 05/22/2023   CO2 28 05/22/2023   TSH 0.41 05/22/2023   INR 0.9 10/16/2020   HGBA1C 5.6 10/12/2006    No results found.  Assessment & Plan:   Problem List Items Addressed This Visit     SYNCOPE - Primary   06/15/2023 ?vaso-vagal. Possible heat exhaustion outside  the day prior. Rest Hydrate well      Hypothyroidism    Cont w/LEVOTHROID 112 MCG/D      Heat exhaustion   Rest Hydrate well         Meds ordered this encounter  Medications   cephALEXin  (KEFLEX ) 500 MG capsule    Sig: Take 1 capsule (500 mg total) by mouth 4 (four) times daily.    Dispense:  40 capsule    Refill:  0      Follow-up: Return in about 3 months (around 09/16/2023) for a follow-up visit.  Anitra Barn, MD

## 2023-06-16 NOTE — Assessment & Plan Note (Signed)
 Rest Hydrate well

## 2023-06-16 NOTE — Assessment & Plan Note (Signed)
 06/15/2023 ?vaso-vagal. Possible heat exhaustion outside  the day prior. Rest Hydrate well

## 2023-06-18 ENCOUNTER — Encounter: Payer: Self-pay | Admitting: Physician Assistant

## 2023-06-18 ENCOUNTER — Ambulatory Visit: Admitting: Psychology

## 2023-06-18 ENCOUNTER — Ambulatory Visit

## 2023-06-18 DIAGNOSIS — R4189 Other symptoms and signs involving cognitive functions and awareness: Secondary | ICD-10-CM

## 2023-06-18 NOTE — Progress Notes (Signed)
   Psychometrician Note   Cognitive testing was administered to Ellen [name] by Arnulfo Larch, B.S. (psychometrist) under the supervision of Dr. Janice Meeter, Psy.D., licensed psychologist on 06/18/2023. Ms. Thome did not appear overtly distressed by the testing session per behavioral observation or responses across self-report questionnaires. Rest breaks were offered.    The battery of tests administered was selected by Dr. Janice Meeter, Psy.D. with consideration to Ms. Lightsey's current level of functioning, the nature of her symptoms, emotional and behavioral responses during interview, level of literacy, observed level of motivation/effort, and the nature of the referral question. This battery was communicated to the psychometrist. Communication between Dr. Janice Meeter, Psy.D. and the psychometrist was ongoing throughout the evaluation and Dr. Janice Meeter, Psy.D. was immediately accessible at all times. Dr. Janice Meeter, Psy.D. provided supervision to the psychometrist on the date of this service to the extent necessary to assure the quality of all services provided.    Darlen L Moss will return within approximately 1-2 weeks for an interactive feedback session with Dr. Donavon Fudge at which time her test performances, clinical impressions, and treatment recommendations will be reviewed in detail. Ms. Tally understands she can contact our office should she require our assistance before this time.  A total of 115 minutes of billable time were spent face-to-face with Ms. Macrae by the psychometrist. This includes both test administration and scoring time. Billing for these services is reflected in the clinical report generated by Dr. Janice Meeter, Psy.D.  This note reflects time spent with the psychometrician and does not include test scores or any clinical interpretations made by Dr. Donavon Fudge. The full report will follow in a separate note.

## 2023-06-19 ENCOUNTER — Ambulatory Visit: Payer: Self-pay | Admitting: Physician Assistant

## 2023-06-19 ENCOUNTER — Ambulatory Visit: Admitting: Pediatrics

## 2023-06-19 NOTE — Progress Notes (Signed)
 Sent mychart message
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
## 2023-06-21 ENCOUNTER — Ambulatory Visit
Admission: RE | Admit: 2023-06-21 | Discharge: 2023-06-21 | Disposition: A | Discharge status: Home / Self Care | Source: Ambulatory Visit | Attending: Physician Assistant | Admitting: Physician Assistant

## 2023-06-22 ENCOUNTER — Telehealth: Payer: Self-pay

## 2023-06-22 NOTE — Telephone Encounter (Signed)
 Done

## 2023-06-22 NOTE — Telephone Encounter (Signed)
 Patient ran for [employer]ovisc for bilateral knees on 06/22/23. Case #: [id number]. Pending approval.

## 2023-06-24 NOTE — Telephone Encounter (Signed)
 Pre certification faxed for Synvisc-one. [employer] non preferred for [employer]

## 2023-06-26 ENCOUNTER — Ambulatory Visit: Admitting: Psychology

## 2023-06-26 DIAGNOSIS — R4189 Other symptoms and signs involving cognitive functions and awareness: Secondary | ICD-10-CM | POA: Diagnosis not present

## 2023-06-26 NOTE — Progress Notes (Signed)
.  bdkeiss  NEUROPSYCHOLOGY FEEDBACK SESSION [place]. [facility]  [place] Department of Neurology  Date of Feedback Session: 06/26/2023  REASON FOR REFERRAL   Annalysia Outten is a 73 year old, right-handed, White female with 14 years of formal education. She was referred for neuropsychological evaluation by Tex Filbert, PA-C, to assess current neurocognitive functioning, document potential cognitive deficits, and assist with treatment planning. This is her first neuropsychological evaluation.  FEEDBACK   Patient completed a comprehensive neuropsychological evaluation on 06/18/2023. Please refer to that encounter for the full report and recommendations. Briefly, results indicated generally normal test performance with minimal isolated weaknesses on tasks of visual attention/discrimination and both immediate and delayed visual recall (preserved recognition). At this time, there is not enough impairment to warrant a diagnosis of mild cognitive impairment. Etiology of subjective cognitive concerns is likely multifactorial, including normal aging, anxiety, and maintaining a busy schedule. Low B12 level, alcohol use, and sleep issues may also have contributed to her noticing cognitive changes, though she has recently started to address these factors. Additionally, she has multiple chronic vascular risk factors which may be contributory, though the extent of possible cerebrovascular pathology remains uncertain in the absence of neuroimaging.  Today, the patient was accompanied by her husband. They were provided verbal feedback regarding the findings and impression during this visit, and their questions were answered. A copy of the report was provided at the conclusion of the visit.  DISPOSITION   Patient will follow up with the referring provider, Ms. Wertman. No follow-up neuropsychological testing was scheduled at this time. Please feel free to refer the patient for repeated evaluation if she  shows a significant change in neurocognitive status.  SERVICE   This feedback session was conducted by Janice Meeter, Psy.D. [place] unit of 16[address] (35 minutes) was billed for Dr. Donavon Fudge' time spent in preparing, conducting, and documenting the current feedback session.  This report was generated using voice recognition software. While this document has been carefully reviewed, transcription errors may be present. I apologize in advance for any inconvenience. Please contact me if further clarification is needed.

## 2023-06-28 ENCOUNTER — Other Ambulatory Visit: Payer: Self-pay | Admitting: Internal Medicine

## 2023-07-06 MED ORDER — ASPIRIN 81 MG PO TBEC: 81.0000 mg | DELAYED_RELEASE_TABLET | Freq: Every day | ORAL | Status: AC

## 2023-07-06 NOTE — Telephone Encounter (Signed)
 Scheduled for 07/20/23   Synvisc does not require a PA through patients insurance as it is a preferred product Documentation scanned  No reference number Checked with fax

## 2023-07-07 NOTE — Telephone Encounter (Signed)
 noted

## 2023-07-08 NOTE — Progress Notes (Unsigned)
 [name] [address] [place] [address] Phone: [phone] Subjective:   [name] am a scribe for Dr. Claudene.   I'[name] seeing this patient by the request  of:  Plotnikov, Karlynn GAILS, MD  CC: right knee pain   YEP:Dlagzrupcz  Ellen [name] is a 73 y.o. female coming in with complaint of R knee pain. Approved for Synvisc One.  Here for evaluation first.  Patient states that getting the injections today.       Past Medical History:  Diagnosis Date   ALLERGIC RHINITIS    ANXIETY    Arthritis    GERD    HSV infection    oral   Hypothyroidism    INSOMNIA-SLEEP DISORDER-UNSPEC    Osteopenia 01/2016   T score -1.2 FRAX 15%/0.9%   Shingles    Thyroid  disease    Past Surgical History:  Procedure Laterality Date   ANKLE FRACTURE SURGERY  2012   R   APPENDECTOMY     BREAST SURGERY     REDUCTION MAMMOPLASTY   CESAREAN SECTION     twice   COSMETIC SURGERY     FACELIFT     s/p   HIP SURGERY Right 12/08/2019   Patient reported   TONSILLECTOMY  1974   TOTAL HIP ARTHROPLASTY Left 10/16/2020   Procedure: LEFT TOTAL HIP ARTHROPLASTY ANTERIOR APPROACH;  Surgeon: Sheril Coy, MD;  Location: [facility];  Service: Orthopedics;  Laterality: Left;   Upper nl TSH w/sx     VAGINAL HYSTERECTOMY  1991   TVH, irregular bleeding   Social History   Socioeconomic History   Marital status: Married    Spouse name: Not on file   Number of children: 2   Years of education: 14   Highest education level: Associate degree: academic program  Occupational History   Occupation: Retired    Associate Professor: SELF EMPLOYED  Tobacco Use   Smoking status: Never   Smokeless tobacco: Never  Vaping Use   Vaping status: Never Used  Substance and Sexual Activity   Alcohol use: Yes    Comment: wine occ   Drug use: No   Sexual activity: Yes    Birth control/protection: Post-menopausal, Surgical    Comment: HYST-1st intercourse 73 yo-Fewer than 5 partners   Other Topics Concern   Not on file  Social History Narrative   Regular exercise2 biol children-1 disabled with schizophrenia & 1 stepchild   2 story home   Drinks caffeine   Married 31.5 years   Social Drivers of Corporate investment banker Strain: Low Risk  (03/16/2023)   Overall Financial Resource Strain (CARDIA)    Difficulty of Paying Living Expenses: Not hard at all  Food Insecurity: No Food Insecurity (03/16/2023)   Hunger Vital Sign    Worried About Running Out of Food in the Last Year: Never true    Ran Out of Food in the Last Year: Never true  Transportation Needs: No Transportation Needs (03/16/2023)   PRAPARE - Administrator, Civil Service (Medical): No    Lack of Transportation (Non-Medical): No  Physical Activity: Insufficiently Active (03/16/2023)   Exercise Vital Sign    Days of Exercise per Week: 4 days    Minutes of Exercise per Session: 30 min  Stress: No Stress Concern Present (03/16/2023)   [employer] of Occupational Health - Occupational Stress Questionnaire    Feeling of Stress : Not at all  Social Connections: Socially Integrated (  03/16/2023)   Social Connection and Isolation Panel    Frequency of Communication with Friends and Family: More than three times a week    Frequency of Social Gatherings with Friends and Family: More than three times a week    Attends Religious Services: 1 to 4 times per year    Active Member of [employer] or Organizations: Yes    Attends Engineer, structural: More than 4 times per year    Marital Status: Married   Allergies  Allergen Reactions   Belviq [Lorcaserin  Hcl] Diarrhea   Demerol [Meperidine] Other (See Comments)    Palpitations. Many years ago.   Elavil  [Amitriptyline  Hcl] Other (See Comments)    constipation   Hydrocodone      Confusion, a very low BP after taking Oxycodone  and Norco after hip surgery, would get very orthostatic, syncopal - Oct 2022 and Dec 2021  Can take Tramadol     Oxycodone  Other (See Comments)    Confusion, a very low BP after taking Oxycodone  and Norco after hip surgery, would get very orthostatic, syncopal - Oct 2022 and Dec 2021  Can take Tramadol    Peanut Oil Itching   Shellfish-Derived Products Itching   Latex Hives, Itching and Rash    Redness    Medical Adhesive Remover Hives, Itching and Rash   Family History  Problem Relation Age of Onset   Heart disease Father        CAD/CABG in his 65's   Hypertension Mother    Heart failure Mother     Current Outpatient Medications (Endocrine & Metabolic):    estradiol  (CLIMARA ) 0.025 mg/24hr patch, Place 1 patch (0.025 mg total) onto the skin once a week.   levothyroxine  (SYNTHROID ) 125 MCG tablet, Take 1 tablet (125 mcg total) by mouth daily.   Current Outpatient Medications (Respiratory):    fexofenadine (ALLEGRA) 180 MG tablet, Take 180 mg by mouth daily.  Current Outpatient Medications (Analgesics):    aspirin  EC 81 MG tablet, Take 1 tablet (81 mg total) by mouth daily.  Current Outpatient Medications (Hematological):    Cyanocobalamin  (B-12) 5000 MCG SUBL, 1 tab sl qd  Current Outpatient Medications (Other):    Calcium Carb-Cholecalciferol (CALCIUM + D3 PO), Take by mouth. Calcium 600mg  & vit d3 2000 iu   cephALEXin  (KEFLEX ) 500 MG capsule, Take 1 capsule (500 mg total) by mouth 4 (four) times daily.   citalopram  (CELEXA ) 20 MG tablet, Take 1 tablet (20 mg total) by mouth daily.   CRANBERRY EXTRACT PO, Take 1 capsule by mouth daily.    diphenoxylate -atropine  (LOMOTIL ) 2.5-0.025 MG tablet, Take 1-2 tablets by mouth 4 (four) times daily as needed for diarrhea or loose stools. (Patient not taking: Reported on 07/16/2023)   gabapentin  (NEURONTIN ) 300 MG capsule, 1 capsule. As needed   Multiple Vitamin (MULTIVITAMIN) tablet, Take 1 tablet by mouth daily.   NON FORMULARY, Take 1 capsule by mouth at bedtime. Tart cherry extract   ondansetron  (ZOFRAN ) 4 MG tablet, Take 1 tablet (4 mg total)  by mouth every 8 (eight) hours as needed for nausea or vomiting. (Patient not taking: Reported on 07/16/2023)   Probiotic Product (PROBIOTIC PO), Take by mouth. 60 billion   traZODone  (DESYREL ) 50 MG tablet, Take 1 tablet (50 mg total) by mouth at bedtime.   triamcinolone  ointment (KENALOG ) 0.5 %, Use qid prn   trimethoprim -polymyxin b  (POLYTRIM ) ophthalmic solution, Place 1 drop into the left eye every 6 (six) hours.   TURMERIC PO, Take 1 capsule by mouth  daily.   UNABLE TO FIND, Med Name: nutrafol (Patient not taking: Reported on 07/16/2023)   valACYclovir  (VALTREX ) 500 MG tablet, Take 1 tablet (500 mg total) by mouth 2 (two) times daily.   Valerian Root 100 MG CAPS, Take 100 mg by mouth at bedtime. (Patient not taking: Reported on 07/16/2023)   Reviewed prior external information including notes and imaging from  primary care provider As well as notes that were available from care everywhere and other healthcare systems.  Past medical history, social, surgical and family history all reviewed in electronic medical record.  No pertanent information unless stated regarding to the chief complaint.   Review of Systems:  No headache, visual changes, nausea, vomiting, diarrhea, constipation, dizziness, abdominal pain, skin rash, fevers, chills, night sweats, weight loss, swollen lymph nodes, body aches, joint swelling, chest pain, shortness of breath, mood changes. POSITIVE muscle aches  Objective  There were no vitals taken for this visit.   General: No apparent distress alert and oriented x3 mood and affect normal, dressed appropriately.  HEENT: Pupils equal, extraocular movements intact  Respiratory: Patient's speak in full sentences and does not appear short of breath  Cardiovascular: No lower extremity edema, non tender, no erythema  Right knee exam shows crepitus noted.  Does have effusion noted.  Patient does have some mild instability of the knees bilaterally.  After informed written  and verbal consent, patient was seated on exam table. Right knee was prepped with alcohol swab and utilizing anterolateral approach, patient's right knee space was injected with 48 mg per 3 mL of Synvisc 1  (sodium hyaluronate) in a prefilled syringe was injected easily into the knee through a 22-gauge needle..Patient tolerated the procedure well without immediate complications.  After informed written and verbal consent, patient was seated on exam table. Left knee was prepped with alcohol swab and utilizing anterolateral approach, patient's left knee space was injected with 48 mg per 3 mL of synvisc 1  (sodium hyaluronate) in a prefilled syringe was injected easily into the knee through a 22-gauge needle..Patient tolerated the procedure well without immediate complications.    Impression and Recommendations:    The above documentation has been reviewed and is accurate and complete Ellen [name] Ellen Milbourn, DO

## 2023-07-13 ENCOUNTER — Encounter: Payer: Self-pay | Admitting: Family Medicine

## 2023-07-16 ENCOUNTER — Ambulatory Visit: Admitting: Nurse Practitioner

## 2023-07-16 ENCOUNTER — Encounter: Payer: Self-pay | Admitting: Nurse Practitioner

## 2023-07-16 VITALS — BP 110/78 | HR 66 | Ht 60.0 in | Wt 139.0 lb

## 2023-07-16 DIAGNOSIS — M810 Age-related osteoporosis without current pathological fracture: Secondary | ICD-10-CM | POA: Diagnosis not present

## 2023-07-16 DIAGNOSIS — B009 Herpesviral infection, unspecified: Secondary | ICD-10-CM | POA: Diagnosis not present

## 2023-07-16 DIAGNOSIS — N952 Postmenopausal atrophic vaginitis: Secondary | ICD-10-CM

## 2023-07-16 DIAGNOSIS — Z9289 Personal history of other medical treatment: Secondary | ICD-10-CM

## 2023-07-16 DIAGNOSIS — Z780 Asymptomatic menopausal state: Secondary | ICD-10-CM | POA: Diagnosis not present

## 2023-07-16 DIAGNOSIS — Z01419 Encounter for gynecological examination (general) (routine) without abnormal findings: Secondary | ICD-10-CM

## 2023-07-16 NOTE — Progress Notes (Signed)
   Ellen [name] Sep 24, 1950 [id number]   History:  73 y.o. [id number] presents for breast and pelvic exam. S/P TVH for AUB. PCP started estradiol  0.25 mg patch for low libido in June. Has not noticed a difference in this but does feel a little more energy. Husband also started on testosterone . Normal pap history. Osteoporosis - optimizing calcium, Vit D and exercise. Seeing neurology for memory impairment. Thinks it is related to PPI. Stopped about a week ago and she has noticed a little improvement. PCP manages hypothyroidism.   Gynecologic History No LMP recorded. Patient has had a hysterectomy.   Contraception: status post hysterectomy Sexually active: Yes  Health Maintenance Last Pap: 12/26/2016. Results were: Normal Last mammogram: 02/12/2023. Results were: Normal Last colonoscopy: 08/07/2020. Results were: benign polyps Last Dexa: 04/08/2022. Results were: T-score -2.5  Past medical history, past surgical history, family history and social history were all reviewed and documented in the EPIC chart. Married. Two children from previous marriage, husband also has children. 5 grandchildren.   ROS:  A ROS was performed and pertinent positives and negatives are included.  Exam:  Vitals:   07/16/23 1533  BP: 110/78  Pulse: 66  SpO2: 99%  Weight: 139 lb (63 kg)  Height: 5' (1.524 m)   Body mass index is 27.15 kg/m. Physical Exam Exam conducted with a chaperone present.  Constitutional:      Appearance: Normal appearance.  Neck:     Thyroid : No thyroid  mass, thyromegaly or thyroid  tenderness.  Cardiovascular:     Rate and Rhythm: Normal rate and regular rhythm.  Pulmonary:     Effort: Pulmonary effort is normal.     Breath sounds: Normal breath sounds.  Chest:  Breasts:    Right: Normal.     Left: Normal.  Abdominal:     Palpations: Abdomen is soft.     Tenderness: There is no abdominal tenderness.  Genitourinary:    General: Normal vulva.     Vagina: Normal.     Uterus:  Absent.      Adnexa: Right adnexa normal and left adnexa normal.     Rectum: Normal.     Comments: Atrophic changes Lymphadenopathy:     Upper Body:     Right upper body: No supraclavicular or axillary adenopathy.     Left upper body: No supraclavicular or axillary adenopathy.     Ellen [name], CMA present as chaperone.   Assessment/Plan:  73 y.o. [id number] for breast and pelvic exam.   Encounter for breast and pelvic examination - Education provided on SBEs, importance of preventative screenings, current guidelines, high calcium diet, regular exercise, and multivitamin daily.   Postmenopausal - started ERT about a month ago for low libido. Low dose patch. Has not noticed difference in libido but does feel a little bit more energetic. Discussed cardiovascular risks of starting ERT 10+ years after menopause. Estrogen not found to have benefit in libido.  Age-related osteoporosis without current pathological fracture - 04/2022 T-score -2.5. Optimizing calcium, Vit D and exercise. Recommend repeating in April.   Screening for cervical cancer - Normal Pap history.  No longer screening per guidelines.   Screening for breast cancer - Normal mammogram history.  Continue annual screenings.  Normal breast exam today.  Screening for colon cancer - 2022 colonoscopy. Will repeat at GI's recommended interval.   Return in about 1 year (around 07/15/2024) for B&P (high risk).    Ellen DELENA Shutter DNP, 3:54 PM 07/16/2023

## 2023-07-20 ENCOUNTER — Ambulatory Visit: Admitting: Family Medicine

## 2023-07-20 ENCOUNTER — Encounter: Payer: Self-pay | Admitting: Family Medicine

## 2023-07-20 VITALS — BP 122/60 | HR 70 | Ht 60.0 in | Wt 139.0 lb

## 2023-07-20 DIAGNOSIS — M170 Bilateral primary osteoarthritis of knee: Secondary | ICD-10-CM

## 2023-07-20 MED ORDER — HYLAN G-F 20 48 MG/6ML IX SOSY
48.0000 mg | PREFILLED_SYRINGE | Freq: Once | INTRA_ARTICULAR | Status: AC
  Administered 2023-07-20: 48 mg via INTRA_ARTICULAR

## 2023-07-20 NOTE — Assessment & Plan Note (Signed)
 Patient continues to stay extremely active.  Continues to ride her bike on a regular basis and has been very involved continue coming up in the near future.  Discussed with patient about icing regimen.  Discussed the post viscosupplementation care.  Patient is able to increase activity as tolerated.  Follow-up with me again in 6 to 8 weeks otherwise.

## 2023-07-20 NOTE — Patient Instructions (Addendum)
 Good to see you. Synvisc One injections bilateral knees.  See me again at the beginning of September.

## 2023-07-21 DIAGNOSIS — H10023 Other mucopurulent conjunctivitis, bilateral: Secondary | ICD-10-CM | POA: Diagnosis not present

## 2023-07-31 ENCOUNTER — Ambulatory Visit: Admitting: Family Medicine

## 2023-08-03 DIAGNOSIS — R29890 Loss of height: Secondary | ICD-10-CM | POA: Diagnosis not present

## 2023-08-03 DIAGNOSIS — M8588 Other specified disorders of bone density and structure, other site: Secondary | ICD-10-CM | POA: Diagnosis not present

## 2023-08-03 LAB — HM DEXA SCAN

## 2023-08-04 ENCOUNTER — Ambulatory Visit: Payer: Self-pay | Admitting: Nurse Practitioner

## 2023-08-04 ENCOUNTER — Encounter: Payer: Self-pay | Admitting: Internal Medicine

## 2023-08-04 ENCOUNTER — Encounter: Payer: Self-pay | Admitting: Nurse Practitioner

## 2023-08-05 ENCOUNTER — Other Ambulatory Visit: Payer: Self-pay | Admitting: Internal Medicine

## 2023-08-05 MED ORDER — DONEPEZIL HCL 5 MG PO TABS: 5.0000 mg | ORAL_TABLET | Freq: Every day | ORAL | 5 refills | Status: DC

## 2023-08-07 ENCOUNTER — Encounter: Payer: Self-pay | Admitting: Internal Medicine

## 2023-08-19 ENCOUNTER — Encounter: Payer: Self-pay | Admitting: Internal Medicine

## 2023-08-19 ENCOUNTER — Encounter: Payer: Self-pay | Admitting: Nurse Practitioner

## 2023-08-19 ENCOUNTER — Ambulatory Visit: Admitting: Nurse Practitioner

## 2023-08-19 VITALS — BP 110/70 | HR 81

## 2023-08-19 DIAGNOSIS — M810 Age-related osteoporosis without current pathological fracture: Secondary | ICD-10-CM

## 2023-08-19 NOTE — Progress Notes (Signed)
   Acute Office Visit  Subjective:    Patient ID: Ellen [name], female    DOB: Nov 16, 1950, 73 y.o.   MRN: [id number]   HPI 73 y.o. presents today to discuss DXA 08/03/2023. T-score -2.9 in left arm, -2.8 in right arm. Bilateral hips and spine excluded due to hardware and degenerative disease. Currently doing Vit D and calcium. Cyclist. Mother with history of osteoporosis and hip fracture. Patient had ankle fracture at age 18. H/O GERD.   No LMP recorded. Patient has had a hysterectomy.    Review of Systems  Constitutional: Negative.        Objective:    Physical Exam Constitutional:      Appearance: Normal appearance.     BP 110/70   Pulse 81   SpO2 99%  Wt Readings from Last 3 Encounters:  07/20/23 139 lb (63 kg)  07/16/23 139 lb (63 kg)  06/16/23 139 lb (63 kg)        Assessment & Plan:   Problem List Items Addressed This Visit   None Visit Diagnoses       Age-related osteoporosis without current pathological fracture    -  Primary      Plan: Discussed DXA results and comparison from last year. Taking Vit D + Calcium and very active cyclist. Discussed medication management with oral, injection and infusion to include MOA, risks, benefits and potential side effects. H/O GERD, so oral not recommended. Interested in Prolia.    Ellen DELENA Shutter DNP, 4:05 PM 08/19/2023

## 2023-08-23 ENCOUNTER — Other Ambulatory Visit: Payer: Self-pay | Admitting: Internal Medicine

## 2023-08-23 MED ORDER — DONEPEZIL HCL 5 MG PO TABS: 5.0000 mg | ORAL_TABLET | Freq: Every day | ORAL | 3 refills | Status: AC

## 2023-09-01 DIAGNOSIS — Z8719 Personal history of other diseases of the digestive system: Secondary | ICD-10-CM | POA: Diagnosis not present

## 2023-09-01 DIAGNOSIS — Z8711 Personal history of peptic ulcer disease: Secondary | ICD-10-CM | POA: Diagnosis not present

## 2023-09-01 DIAGNOSIS — K31A0 Gastric intestinal metaplasia, unspecified: Secondary | ICD-10-CM | POA: Diagnosis not present

## 2023-09-08 NOTE — Progress Notes (Unsigned)
 [name] [address] [place] [address] Phone: [phone] Subjective:   Ellen [name], am serving as a scribe for Dr. Arthea Claudene.  I'm seeing this patient by the request  of:  Plotnikov, Ellen GAILS, MD  CC: Bilateral knee pain  YEP:Dlagzrupcz  07/20/2023 Patient continues to stay extremely active.  Continues to ride her bike on a regular basis and has been very involved continue coming up in the near future.  Discussed with patient about icing regimen.  Discussed the post viscosupplementation care.  Patient is able to increase activity as tolerated.  Follow-up with me again in 6 to 8 weeks otherwise.      Update 09/09/2023 Ellen [name] is a 73 y.o. female coming in with complaint of B knee pain.  Known arthritis.  Last injections were in July.  Patient states injections in B knee today. No other symptoms. Getting ready for [place] bike ride.    Reviewing patient's chart has been seen for osteoporosis recently.  Were to discuss the possibility of Prolia .  Past Medical History:  Diagnosis Date   ALLERGIC RHINITIS    Allergy 8 yrs of age   After moving to a different place with dif vegetation   ANXIETY    Arthritis    Asthma    Cataract    Removed 2023 by R. Glendia Gaudy opthalmologist   GERD    HSV infection    oral   Hypothyroidism    INSOMNIA-SLEEP DISORDER-UNSPEC    Osteopenia 01/2016   T score -1.2 FRAX 15%/0.9%   Shingles    Thyroid  disease    Ulcer 2019   Past Surgical History:  Procedure Laterality Date   ANKLE FRACTURE SURGERY  2012   R   APPENDECTOMY     BREAST SURGERY     REDUCTION MAMMOPLASTY   CESAREAN SECTION     twice   COSMETIC SURGERY     EYE SURGERY  2023   Both eyes   FACELIFT     s/p   FRACTURE SURGERY  2012   HIP SURGERY Right 12/08/2019   Patient reported   JOINT REPLACEMENT  2022, 2023   Rt and Lf hip replacements   TONSILLECTOMY  1974   TOTAL HIP ARTHROPLASTY Left 10/16/2020   Procedure: LEFT  TOTAL HIP ARTHROPLASTY ANTERIOR APPROACH;  Surgeon: Sheril Coy, MD;  Location: [facility];  Service: Orthopedics;  Laterality: Left;   Upper nl TSH w/sx     VAGINAL HYSTERECTOMY  1991   TVH, irregular bleeding   Social History   Socioeconomic History   Marital status: Married    Spouse name: Not on file   Number of children: 2   Years of education: 14   Highest education level: Associate degree: academic program  Occupational History   Occupation: Retired    Associate Professor: SELF EMPLOYED  Tobacco Use   Smoking status: Never   Smokeless tobacco: Never  Vaping Use   Vaping status: Never Used  Substance and Sexual Activity   Alcohol use: Not Currently   Drug use: No   Sexual activity: Yes    Birth control/protection: Post-menopausal, Surgical    Comment: HYST-1st intercourse 73 yo-Fewer than 5 partners  Other Topics Concern   Not on file  Social History Narrative   Regular exercise2 biol children-1 disabled with schizophrenia & 1 stepchild   2 story home   Drinks caffeine   Married 31.5 years   Social Drivers of Health  Financial Resource Strain: Low Risk  (03/16/2023)   Overall Financial Resource Strain (CARDIA)    Difficulty of Paying Living Expenses: Not hard at all  Food Insecurity: No Food Insecurity (03/16/2023)   Hunger Vital Sign    Worried About Running Out of Food in the Last Year: Never true    Ran Out of Food in the Last Year: Never true  Transportation Needs: No Transportation Needs (03/16/2023)   PRAPARE - Administrator, Civil Service (Medical): No    Lack of Transportation (Non-Medical): No  Physical Activity: Insufficiently Active (03/16/2023)   Exercise Vital Sign    Days of Exercise per Week: 4 days    Minutes of Exercise per Session: 30 min  Stress: No Stress Concern Present (03/16/2023)   [employer] of Occupational Health - Occupational Stress Questionnaire    Feeling of Stress : Not at all  Social Connections: Socially Integrated  (03/16/2023)   Social Connection and Isolation Panel    Frequency of Communication with Friends and Family: More than three times a week    Frequency of Social Gatherings with Friends and Family: More than three times a week    Attends Religious Services: 1 to 4 times per year    Active Member of [employer] or Organizations: Yes    Attends Engineer, structural: More than 4 times per year    Marital Status: Married   Allergies  Allergen Reactions   Belviq [Lorcaserin  Hcl] Diarrhea   Demerol [Meperidine] Other (See Comments)    Palpitations. Many years ago.   Elavil  [Amitriptyline  Hcl] Other (See Comments)    constipation   Hydrocodone      Confusion, a very low BP after taking Oxycodone  and Norco after hip surgery, would get very orthostatic, syncopal - Oct 2022 and Dec 2021  Can take Tramadol    Oxycodone  Other (See Comments)    Confusion, a very low BP after taking Oxycodone  and Norco after hip surgery, would get very orthostatic, syncopal - Oct 2022 and Dec 2021  Can take Tramadol    Peanut Oil Itching   Shellfish-Derived Products Itching   Latex Hives, Itching and Rash    Redness    Medical Adhesive Remover Hives, Itching and Rash   Family History  Problem Relation Age of Onset   Heart disease Father        CAD/CABG in his 48's   Arthritis Father    Hypertension Mother    Heart failure Mother    Anxiety disorder Mother    Arthritis Mother    Hypertension Maternal Grandmother    Anxiety disorder Brother    Arthritis Brother     Current Outpatient Medications (Endocrine & Metabolic):    levothyroxine  (SYNTHROID ) 125 MCG tablet, Take 1 tablet (125 mcg total) by mouth daily.   Current Outpatient Medications (Respiratory):    fexofenadine (ALLEGRA) 180 MG tablet, Take 180 mg by mouth daily.  Current Outpatient Medications (Analgesics):    aspirin  EC 81 MG tablet, Take 1 tablet (81 mg total) by mouth daily.  Current Outpatient Medications (Hematological):     Cyanocobalamin  (B-12) 5000 MCG SUBL, 1 tab sl qd  Current Outpatient Medications (Other):    Calcium Carb-Cholecalciferol (CALCIUM + D3 PO), Take by mouth. Calcium 600mg  & vit d3 2000 iu   citalopram  (CELEXA ) 20 MG tablet, Take 1 tablet (20 mg total) by mouth daily.   CRANBERRY EXTRACT PO, Take 1 capsule by mouth daily.    donepezil  (ARICEPT ) 5 MG tablet, Take 1  tablet (5 mg total) by mouth at bedtime.   Multiple Vitamin (MULTIVITAMIN) tablet, Take 1 tablet by mouth daily.   ofloxacin (OCUFLOX) 0.3 % ophthalmic solution, 1 drop 3 (three) times daily.   Probiotic Product (PROBIOTIC PO), Take by mouth. 60 billion   traZODone  (DESYREL ) 50 MG tablet, Take 1 tablet (50 mg total) by mouth at bedtime.   triamcinolone  ointment (KENALOG ) 0.5 %, Use qid prn   trimethoprim -polymyxin b  (POLYTRIM ) ophthalmic solution, Place 1 drop into the left eye every 6 (six) hours.   valACYclovir  (VALTREX ) 500 MG tablet, Take 1 tablet (500 mg total) by mouth 2 (two) times daily.   Valerian Root 100 MG CAPS, Take 100 mg by mouth at bedtime.   Reviewed prior external information including notes and imaging from  primary care provider As well as notes that were available from care everywhere and other healthcare systems.  Past medical history, social, surgical and family history all reviewed in electronic medical record.  No pertanent information unless stated regarding to the chief complaint.   Review of Systems:  No headache, visual changes, nausea, vomiting, diarrhea, constipation, dizziness, abdominal pain, skin rash, fevers, chills, night sweats, weight loss, swollen lymph nodes, body aches, joint swelling, chest pain, shortness of breath, mood changes. POSITIVE muscle aches  Objective  Blood pressure 124/86, pulse 80, height 5' (1.524 m), weight 136 lb (61.7 kg), SpO2 97%.   General: No apparent distress alert and oriented x3 mood and affect normal, dressed appropriately.  HEENT: Pupils equal, extraocular  movements intact  Respiratory: Patient's speak in full sentences and does not appear short of breath  Cardiovascular: No lower extremity edema, non tender, no erythema  Bilateral knees do show arthritic changes noted.  Larger effusion noted noted on the right than the left.  Lacks some range of motion on the right compared to left.  Mild instability noted with valgus and varus force.  After informed written and verbal consent, patient was seated on exam table. Right knee was prepped with alcohol swab and utilizing anterolateral approach, patient's right knee space was injected with 4:1  marcaine  0.5%: Kenalog  40mg /dL. Patient tolerated the procedure well without immediate complications.  After informed written and verbal consent, patient was seated on exam table. Left knee was prepped with alcohol swab and utilizing anterolateral approach, patient's left knee space was injected with 4:1  marcaine  0.5%: Kenalog  40mg /dL. Patient tolerated the procedure well without immediate complications.    Impression and Recommendations:    The above documentation has been reviewed and is accurate and complete Minard Millirons M Bentleigh Waren, DO

## 2023-09-09 ENCOUNTER — Encounter: Payer: Self-pay | Admitting: Family Medicine

## 2023-09-09 ENCOUNTER — Ambulatory Visit: Admitting: Family Medicine

## 2023-09-09 VITALS — BP 124/86 | HR 80 | Ht 60.0 in | Wt 136.0 lb

## 2023-09-09 DIAGNOSIS — M170 Bilateral primary osteoarthritis of knee: Secondary | ICD-10-CM | POA: Diagnosis not present

## 2023-09-09 NOTE — Assessment & Plan Note (Signed)
 Injections given again today, tolerated the procedure well, worsening pain I do think patient would be a good candidate for viscosupplementation.  Would not be due though again until January.  Discussed that we could potentially consider the possibility of home exercises otherwise, repeating steroid injection in 3 months as well.  Follow-up again in 3 months

## 2023-09-09 NOTE — Patient Instructions (Signed)
 Good to see you!  Kick butt on the ride See you again in 3 months

## 2023-09-10 ENCOUNTER — Other Ambulatory Visit: Payer: Self-pay | Admitting: *Deleted

## 2023-09-10 DIAGNOSIS — M810 Age-related osteoporosis without current pathological fracture: Secondary | ICD-10-CM

## 2023-09-10 MED ORDER — DENOSUMAB 60 MG/ML ~~LOC~~ SOSY
60.0000 mg | PREFILLED_SYRINGE | SUBCUTANEOUS | Status: AC
  Administered 2023-09-29: 60 mg via SUBCUTANEOUS

## 2023-09-15 ENCOUNTER — Other Ambulatory Visit: Payer: Self-pay | Admitting: Internal Medicine

## 2023-09-16 ENCOUNTER — Ambulatory Visit: Admitting: Internal Medicine

## 2023-09-16 ENCOUNTER — Encounter: Payer: Self-pay | Admitting: Internal Medicine

## 2023-09-16 VITALS — BP 116/78 | HR 75 | Temp 98.6°F | Ht 61.5 in | Wt 135.6 lb

## 2023-09-16 DIAGNOSIS — M8580 Other specified disorders of bone density and structure, unspecified site: Secondary | ICD-10-CM | POA: Diagnosis not present

## 2023-09-16 DIAGNOSIS — E538 Deficiency of other specified B group vitamins: Secondary | ICD-10-CM

## 2023-09-16 DIAGNOSIS — R931 Abnormal findings on diagnostic imaging of heart and coronary circulation: Secondary | ICD-10-CM

## 2023-09-16 DIAGNOSIS — E785 Hyperlipidemia, unspecified: Secondary | ICD-10-CM

## 2023-09-16 DIAGNOSIS — R413 Other amnesia: Secondary | ICD-10-CM

## 2023-09-16 DIAGNOSIS — E559 Vitamin D deficiency, unspecified: Secondary | ICD-10-CM | POA: Diagnosis not present

## 2023-09-16 MED ORDER — B-12 5000 MCG SL SUBL: SUBLINGUAL_TABLET | SUBLINGUAL | 3 refills | Status: AC

## 2023-09-16 NOTE — Assessment & Plan Note (Signed)
 much better on B12 and w/o alcohol

## 2023-09-16 NOTE — Assessment & Plan Note (Signed)
 On B12 5000 mcg SL  Memory is better

## 2023-09-16 NOTE — Progress Notes (Signed)
 Subjective:  Patient ID: Ellen [name], female    DOB: [birth year]  Age: 73 y.o. MRN: [id number]  CC: Follow-up (Wants to talk to her about her memory and that she is now taking things for that. And wants to discuss b12 medicine and she knocked some down the drain so flushed it. )   HPI Ellen [name] presents for memory loss - much better on B12 and w/o alcohol Follow-up on osteopenia, vitamin D  and vitamin B12 deficiency  Outpatient Medications Prior to Visit  Medication Sig Dispense Refill   aspirin  EC 81 MG tablet Take 1 tablet (81 mg total) by mouth daily.     Calcium Carb-Cholecalciferol (CALCIUM + D3 PO) Take by mouth. Calcium 600mg  & vit d3 2000 iu     CRANBERRY EXTRACT PO Take 1 capsule by mouth daily.      donepezil  (ARICEPT ) 5 MG tablet Take 1 tablet (5 mg total) by mouth at bedtime. 90 tablet 3   fexofenadine (ALLEGRA) 180 MG tablet Take 180 mg by mouth daily.     levothyroxine  (SYNTHROID ) 125 MCG tablet Take 1 tablet (125 mcg total) by mouth daily. 90 tablet 3   Multiple Vitamin (MULTIVITAMIN) tablet Take 1 tablet by mouth daily.     pantoprazole  (PROTONIX ) 40 MG tablet Take 1 tablet by mouth once daily 90 tablet 3   Probiotic Product (PROBIOTIC PO) Take by mouth. 60 billion     traZODone  (DESYREL ) 50 MG tablet Take 1 tablet (50 mg total) by mouth at bedtime. 90 tablet 3   triamcinolone  ointment (KENALOG ) 0.5 % Use qid prn 90 g 3   trimethoprim -polymyxin b  (POLYTRIM ) ophthalmic solution Place 1 drop into the left eye every 6 (six) hours. 10 mL 0   Valerian Root 100 MG CAPS Take 100 mg by mouth at bedtime.     citalopram  (CELEXA ) 20 MG tablet Take 1 tablet (20 mg total) by mouth daily. 90 tablet 3   Cyanocobalamin  (B-12) 5000 MCG SUBL 1 tab sl qd 100 tablet 1   ofloxacin (OCUFLOX) 0.3 % ophthalmic solution 1 drop 3 (three) times daily. (Patient not taking: Reported on 09/16/2023)     valACYclovir  (VALTREX ) 500 MG tablet Take 1 tablet (500 mg total) by mouth 2 (two) times daily.  14 tablet 2   Facility-Administered Medications Prior to Visit  Medication Dose Route Frequency Provider Last Rate Last Admin   [START ON 03/08/2024] denosumab  (PROLIA ) injection 60 mg  60 mg Subcutaneous Q6 months Wallace, Tiffany A, NP        ROS: Review of Systems  Constitutional:  Negative for activity change, appetite change, chills, fatigue and unexpected weight change.  HENT:  Negative for congestion, mouth sores and sinus pressure.   Eyes:  Negative for visual disturbance.  Respiratory:  Negative for cough and chest tightness.   Gastrointestinal:  Negative for abdominal pain and nausea.  Genitourinary:  Negative for difficulty urinating, frequency and vaginal pain.  Musculoskeletal:  Negative for back pain and gait problem.  Skin:  Negative for pallor and rash.  Neurological:  Negative for dizziness, tremors, weakness, numbness and headaches.  Psychiatric/Behavioral:  Positive for decreased concentration. Negative for confusion, sleep disturbance and suicidal ideas.     Objective:  BP 116/78   Pulse 75   Temp 98.6 F (37 C) (Oral)   Ht 5' 1.5 (1.562 m)   Wt 135 lb 9.6 oz (61.5 kg)   SpO2 97%   BMI 25.21 kg/m   BP Readings from Last  3 Encounters:  09/16/23 116/78  09/09/23 124/86  08/19/23 110/70    Wt Readings from Last 3 Encounters:  09/16/23 135 lb 9.6 oz (61.5 kg)  09/09/23 136 lb (61.7 kg)  07/20/23 139 lb (63 kg)    Physical Exam Constitutional:      General: She is not in acute distress.    Appearance: She is well-developed.  HENT:     Head: Normocephalic.     Right Ear: External ear normal.     Left Ear: External ear normal.     Nose: Nose normal.  Eyes:     General:        Right eye: No discharge.        Left eye: No discharge.     Conjunctiva/sclera: Conjunctivae normal.     Pupils: Pupils are equal, round, and reactive to light.  Neck:     Thyroid : No thyromegaly.     Vascular: No JVD.     Trachea: No tracheal deviation.  Cardiovascular:      Rate and Rhythm: Normal rate and regular rhythm.     Heart sounds: Normal heart sounds.  Pulmonary:     Effort: No respiratory distress.     Breath sounds: No stridor. No wheezing.  Abdominal:     General: Bowel sounds are normal. There is no distension.     Palpations: Abdomen is soft. There is no mass.     Tenderness: There is no abdominal tenderness. There is no guarding or rebound.  Musculoskeletal:        General: No tenderness.     Cervical back: Normal range of motion and neck supple. No rigidity.  Lymphadenopathy:     Cervical: No cervical adenopathy.  Skin:    Findings: No erythema or rash.  Neurological:     Cranial Nerves: No cranial nerve deficit.     Motor: No abnormal muscle tone.     Coordination: Coordination normal.     Deep Tendon Reflexes: Reflexes normal.  Psychiatric:        Behavior: Behavior normal.        Thought Content: Thought content normal.        Judgment: Judgment normal.     Lab Results  Component Value Date   WBC 5.9 05/22/2023   HGB 14.1 05/22/2023   HCT 42.9 05/22/2023   PLT 228.0 05/22/2023   GLUCOSE 91 05/22/2023   CHOL 193 03/05/2022   TRIG 153.0 (H) 03/05/2022   HDL 55.40 03/05/2022   LDLDIRECT 142.6 07/09/2011   LDLCALC 107 (H) 03/05/2022   ALT 18 05/22/2023   AST 25 05/22/2023   NA 140 05/22/2023   K 4.3 05/22/2023   CL 104 05/22/2023   CREATININE 0.77 05/22/2023   BUN 14 05/22/2023   CO2 28 05/22/2023   TSH 0.41 05/22/2023   INR 0.9 10/16/2020   HGBA1C 5.6 10/12/2006    MR BRAIN WO CONTRAST Result Date: 06/23/2023 EXAM DESCRIPTION: MR BRAIN WO CONTRAST CLINICAL HISTORY: memory impariment COMPARISON: None available TECHNIQUE: Non contrast MRI of the brain is performed according to our usual protocol including multiplanar multi sequence technique. FINDINGS: No acute intracranial hemorrhage, mass, edema, or hydrocephalus. No significant cortical atrophy. No encephalomalacia. Mild white matter disease suggesting chronic  small vessel ischemic change. The vascular flow voids are unremarkable. No significant sinus disease. IMPRESSION: Mild age-related change without acute intracranial pathology. Electronically signed by: Reyes Frees MD 06/23/2023 01:33 AM EDT RP Workstation: [id number]    Assessment & Plan:  Problem List Items Addressed This Visit     Agatston CAC score, <100   On red rice yeast extract      B12 deficiency - Primary   On B12 5000 mcg SL  Memory is better      Dyslipidemia   Red rice yeast extract      Memory change    much better on B12 and w/o alcohol      Osteopenia   On vitamin D , Prolia , weightbearing exercises      Vitamin D  deficiency   On Vit D         Meds ordered this encounter  Medications   Cyanocobalamin  (B-12) 5000 MCG SUBL    Sig: 1 tab sl qd    Dispense:  100 tablet    Refill:  3    Ok to fill early      Follow-up: Return in about 3 months (around 12/16/2023) for a follow-up visit.  Marolyn Noel, MD

## 2023-09-16 NOTE — Assessment & Plan Note (Signed)
 On Vit D

## 2023-09-17 ENCOUNTER — Other Ambulatory Visit: Payer: Self-pay | Admitting: Internal Medicine

## 2023-09-17 ENCOUNTER — Encounter: Payer: Self-pay | Admitting: Family Medicine

## 2023-09-18 ENCOUNTER — Other Ambulatory Visit: Payer: Self-pay | Admitting: Family Medicine

## 2023-09-18 DIAGNOSIS — M170 Bilateral primary osteoarthritis of knee: Secondary | ICD-10-CM

## 2023-09-22 ENCOUNTER — Encounter: Payer: Self-pay | Admitting: Nurse Practitioner

## 2023-09-22 DIAGNOSIS — K2940 Chronic atrophic gastritis without bleeding: Secondary | ICD-10-CM | POA: Diagnosis not present

## 2023-09-22 DIAGNOSIS — K2930 Chronic superficial gastritis without bleeding: Secondary | ICD-10-CM | POA: Diagnosis not present

## 2023-09-22 DIAGNOSIS — K259 Gastric ulcer, unspecified as acute or chronic, without hemorrhage or perforation: Secondary | ICD-10-CM | POA: Diagnosis not present

## 2023-09-22 DIAGNOSIS — K31A12 Gastric intestinal metaplasia without dysplasia, involving the body (corpus): Secondary | ICD-10-CM | POA: Diagnosis not present

## 2023-09-22 DIAGNOSIS — K31A0 Gastric intestinal metaplasia, unspecified: Secondary | ICD-10-CM | POA: Diagnosis not present

## 2023-09-22 DIAGNOSIS — K449 Diaphragmatic hernia without obstruction or gangrene: Secondary | ICD-10-CM | POA: Diagnosis not present

## 2023-09-22 DIAGNOSIS — K2900 Acute gastritis without bleeding: Secondary | ICD-10-CM | POA: Diagnosis not present

## 2023-09-27 NOTE — Assessment & Plan Note (Signed)
 On red rice yeast extract

## 2023-09-27 NOTE — Assessment & Plan Note (Signed)
 On vitamin D , Prolia , weightbearing exercises

## 2023-09-27 NOTE — Assessment & Plan Note (Signed)
Red rice yeast extract 

## 2023-09-29 ENCOUNTER — Ambulatory Visit

## 2023-09-29 ENCOUNTER — Other Ambulatory Visit: Payer: Self-pay

## 2023-09-29 ENCOUNTER — Encounter: Payer: Self-pay | Admitting: Family Medicine

## 2023-09-29 DIAGNOSIS — M810 Age-related osteoporosis without current pathological fracture: Secondary | ICD-10-CM | POA: Diagnosis not present

## 2023-09-29 DIAGNOSIS — R2689 Other abnormalities of gait and mobility: Secondary | ICD-10-CM

## 2023-09-29 DIAGNOSIS — M170 Bilateral primary osteoarthritis of knee: Secondary | ICD-10-CM

## 2023-09-29 NOTE — Progress Notes (Signed)
 1st Prolia  injection given in the right SQ arm. Pt tolerated the injection well.

## 2023-10-06 ENCOUNTER — Telehealth (INDEPENDENT_AMBULATORY_CARE_PROVIDER_SITE_OTHER): Payer: Self-pay

## 2023-10-06 NOTE — Telephone Encounter (Signed)
 Pt called needed to cancel appointments in November their insurance [employer] wants them to go to a different place. Pt did ask if you could call their insurance to let them know that you are with [facility] now to see if that will make a difference. Requested a call back as well.

## 2023-10-07 ENCOUNTER — Ambulatory Visit

## 2023-10-12 NOTE — Progress Notes (Deleted)
 Ben Jackson D.[facility] [address] [place] [address] Phone: [phone]   Assessment and Plan:     ***    Pertinent previous records reviewed include ***   Follow Up: ***     Subjective:   I, Ibraheem Voris, am serving as a Neurosurgeon for Doctor Morene Mace  Chief Complaint: multiple musculoskeletal pains   HPI:  07/20/2023 Patient continues to stay extremely active.  Continues to ride her bike on a regular basis and has been very involved continue coming up in the near future.  Discussed with patient about icing regimen.  Discussed the post viscosupplementation care.  Patient is able to increase activity as tolerated.  Follow-up with me again in 6 to 8 weeks otherwise.      Update 09/09/2023 Ellen [name] is a 73 y.o. female coming in with complaint of B knee pain.  Known arthritis.  Last injections were in July.  Patient states injections in B knee today. No other symptoms. Getting ready for [place] bike ride.       Reviewing patient's chart has been seen for osteoporosis recently.  Were to discuss the possibility of Prolia .  10/13/2023 Patient states   Relevant Historical Information: ***  Additional pertinent review of systems negative.   Current Outpatient Medications:    aspirin  EC 81 MG tablet, Take 1 tablet (81 mg total) by mouth daily., Disp: , Rfl:    Calcium Carb-Cholecalciferol (CALCIUM + D3 PO), Take by mouth. Calcium 600mg  & vit d3 2000 iu, Disp: , Rfl:    citalopram  (CELEXA ) 20 MG tablet, Take 1 tablet by mouth once daily, Disp: 90 tablet, Rfl: 3   CRANBERRY EXTRACT PO, Take 1 capsule by mouth daily. , Disp: , Rfl:    Cyanocobalamin  (B-12) 5000 MCG SUBL, 1 tab sl qd, Disp: 100 tablet, Rfl: 3   donepezil  (ARICEPT ) 5 MG tablet, Take 1 tablet (5 mg total) by mouth at bedtime., Disp: 90 tablet, Rfl: 3   fexofenadine (ALLEGRA) 180 MG tablet, Take 180 mg by mouth daily., Disp: , Rfl:    levothyroxine  (SYNTHROID )  125 MCG tablet, Take 1 tablet (125 mcg total) by mouth daily., Disp: 90 tablet, Rfl: 3   Multiple Vitamin (MULTIVITAMIN) tablet, Take 1 tablet by mouth daily., Disp: , Rfl:    ofloxacin (OCUFLOX) 0.3 % ophthalmic solution, 1 drop 3 (three) times daily. (Patient not taking: Reported on 09/16/2023), Disp: , Rfl:    pantoprazole  (PROTONIX ) 40 MG tablet, Take 1 tablet by mouth once daily, Disp: 90 tablet, Rfl: 3   Probiotic Product (PROBIOTIC PO), Take by mouth. 60 billion, Disp: , Rfl:    traZODone  (DESYREL ) 50 MG tablet, Take 1 tablet (50 mg total) by mouth at bedtime., Disp: 90 tablet, Rfl: 3   triamcinolone  ointment (KENALOG ) 0.5 %, Use qid prn, Disp: 90 g, Rfl: 3   trimethoprim -polymyxin b  (POLYTRIM ) ophthalmic solution, Place 1 drop into the left eye every 6 (six) hours., Disp: 10 mL, Rfl: 0   valACYclovir  (VALTREX ) 500 MG tablet, Take 1 tablet (500 mg total) by mouth 2 (two) times daily., Disp: 14 tablet, Rfl: 2   Valerian Root 100 MG CAPS, Take 100 mg by mouth at bedtime., Disp: , Rfl:    Objective:     There were no vitals filed for this visit.    There is no height or weight on file to calculate BMI.    Physical Exam:    ***   Electronically signed  by:  Odis Mace [facility] 12:03 PM 10/12/23

## 2023-10-13 ENCOUNTER — Ambulatory Visit: Admitting: Sports Medicine

## 2023-10-14 DIAGNOSIS — K31A0 Gastric intestinal metaplasia, unspecified: Secondary | ICD-10-CM | POA: Diagnosis not present

## 2023-10-14 DIAGNOSIS — Z8711 Personal history of peptic ulcer disease: Secondary | ICD-10-CM | POA: Diagnosis not present

## 2023-10-16 ENCOUNTER — Other Ambulatory Visit: Payer: Self-pay

## 2023-10-16 ENCOUNTER — Encounter: Payer: Self-pay | Admitting: Internal Medicine

## 2023-10-16 DIAGNOSIS — G4700 Insomnia, unspecified: Secondary | ICD-10-CM

## 2023-10-16 MED ORDER — TRAZODONE HCL 50 MG PO TABS: 50.0000 mg | ORAL_TABLET | Freq: Every day | ORAL | 3 refills | Status: DC

## 2023-10-20 ENCOUNTER — Other Ambulatory Visit: Payer: Self-pay | Admitting: Internal Medicine

## 2023-10-20 DIAGNOSIS — G4700 Insomnia, unspecified: Secondary | ICD-10-CM

## 2023-10-20 MED ORDER — TRAZODONE HCL 50 MG PO TABS: 100.0000 mg | ORAL_TABLET | Freq: Every day | ORAL | 3 refills | Status: AC

## 2023-10-21 ENCOUNTER — Other Ambulatory Visit

## 2023-10-21 DIAGNOSIS — E538 Deficiency of other specified B group vitamins: Secondary | ICD-10-CM | POA: Diagnosis not present

## 2023-10-21 DIAGNOSIS — E785 Hyperlipidemia, unspecified: Secondary | ICD-10-CM | POA: Diagnosis not present

## 2023-10-21 DIAGNOSIS — H04123 Dry eye syndrome of bilateral lacrimal glands: Secondary | ICD-10-CM | POA: Diagnosis not present

## 2023-10-21 DIAGNOSIS — H43813 Vitreous degeneration, bilateral: Secondary | ICD-10-CM | POA: Diagnosis not present

## 2023-10-21 DIAGNOSIS — E034 Atrophy of thyroid (acquired): Secondary | ICD-10-CM | POA: Diagnosis not present

## 2023-10-21 DIAGNOSIS — Z961 Presence of intraocular lens: Secondary | ICD-10-CM | POA: Diagnosis not present

## 2023-10-21 DIAGNOSIS — E559 Vitamin D deficiency, unspecified: Secondary | ICD-10-CM | POA: Diagnosis not present

## 2023-10-21 LAB — LIPID PANEL
Cholesterol: 187 mg/dL (ref 0–200)
HDL: 62.4 mg/dL (ref >39.00)
LDL Cholesterol: 103 mg/dL — ABNORMAL HIGH (ref 0–99)
NonHDL: 124.21
Total CHOL/HDL Ratio: 3
Triglycerides: 107 mg/dL (ref 0.0–149.0)
VLDL: 21.4 mg/dL (ref 0.0–40.0)

## 2023-10-21 LAB — COMPREHENSIVE METABOLIC PANEL WITH GFR
ALT: 15 U/L (ref 0–35)
AST: 24 U/L (ref 0–37)
Albumin: 4.2 g/dL (ref 3.5–5.2)
Alkaline Phosphatase: 66 U/L (ref 39–117)
BUN: 11 mg/dL (ref 6–23)
CO2: 28 meq/L (ref 19–32)
Calcium: 9 mg/dL (ref 8.4–10.5)
Chloride: 103 meq/L (ref 96–112)
Creatinine, Ser: 0.76 mg/dL (ref 0.40–1.20)
GFR: 76.83 mL/min (ref >60.00)
Glucose, Bld: 90 mg/dL (ref 70–99)
Potassium: 4.3 meq/L (ref 3.5–5.1)
Sodium: 138 meq/L (ref 135–145)
Total Bilirubin: 0.4 mg/dL (ref 0.2–1.2)
Total Protein: 6.9 g/dL (ref 6.0–8.3)

## 2023-10-21 LAB — CBC WITH DIFFERENTIAL/PLATELET
Basophils Absolute: 0.1 K/uL (ref 0.0–0.1)
Basophils Relative: 0.6 % (ref 0.0–3.0)
Eosinophils Absolute: 0.5 K/uL (ref 0.0–0.7)
Eosinophils Relative: 4.6 % (ref 0.0–5.0)
HCT: 40.5 % (ref 36.0–46.0)
Hemoglobin: 13.2 g/dL (ref 12.0–15.0)
Lymphocytes Relative: 22.8 % (ref 12.0–46.0)
Lymphs Abs: 2.4 K/uL (ref 0.7–4.0)
MCHC: 32.6 g/dL (ref 30.0–36.0)
MCV: 92.7 fl (ref 78.0–100.0)
Monocytes Absolute: 0.8 K/uL (ref 0.1–1.0)
Monocytes Relative: 7.8 % (ref 3.0–12.0)
Neutro Abs: 6.7 K/uL (ref 1.4–7.7)
Neutrophils Relative %: 64.2 % (ref 43.0–77.0)
Platelets: 270 K/uL (ref 150.0–400.0)
RBC: 4.36 Mil/uL (ref 3.87–5.11)
RDW: 13.3 % (ref 11.5–15.5)
WBC: 10.5 K/uL (ref 4.0–10.5)

## 2023-10-21 LAB — T4, FREE: Free T4: 1.74 ng/dL — ABNORMAL HIGH (ref 0.60–1.60)

## 2023-10-21 LAB — TSH: TSH: 1.15 u[IU]/mL (ref 0.35–5.50)

## 2023-10-21 LAB — VITAMIN D 25 HYDROXY (VIT D DEFICIENCY, FRACTURES): VITD: 97.24 ng/mL (ref 30.00–100.00)

## 2023-10-21 LAB — VITAMIN B12: Vitamin B-12: >1500 pg/mL — ABNORMAL HIGH (ref 211–911)

## 2023-10-22 ENCOUNTER — Ambulatory Visit: Payer: Self-pay | Admitting: Internal Medicine

## 2023-10-22 ENCOUNTER — Ambulatory Visit: Admitting: Physician Assistant

## 2023-10-22 MED ORDER — LEVOTHYROXINE SODIUM 112 MCG PO TABS: 112.0000 ug | ORAL_TABLET | Freq: Every day | ORAL | 3 refills | Status: AC

## 2023-10-26 ENCOUNTER — Encounter: Payer: Self-pay | Admitting: Internal Medicine

## 2023-10-27 ENCOUNTER — Other Ambulatory Visit: Payer: Self-pay | Admitting: Internal Medicine

## 2023-10-28 ENCOUNTER — Other Ambulatory Visit: Payer: Self-pay | Admitting: Internal Medicine

## 2023-10-28 MED ORDER — AZITHROMYCIN 250 MG PO TABS: ORAL_TABLET | ORAL | 0 refills | Status: DC

## 2023-10-31 ENCOUNTER — Encounter: Payer: Self-pay | Admitting: Nurse Practitioner

## 2023-11-01 ENCOUNTER — Encounter: Payer: Self-pay | Admitting: Internal Medicine

## 2023-11-02 ENCOUNTER — Ambulatory Visit (INDEPENDENT_AMBULATORY_CARE_PROVIDER_SITE_OTHER): Admitting: Internal Medicine

## 2023-11-02 ENCOUNTER — Encounter: Payer: Self-pay | Admitting: Internal Medicine

## 2023-11-02 VITALS — BP 118/82 | HR 69 | Temp 98.7°F | Ht 61.5 in | Wt 138.4 lb

## 2023-11-02 DIAGNOSIS — R310 Gross hematuria: Secondary | ICD-10-CM | POA: Insufficient documentation

## 2023-11-02 DIAGNOSIS — J069 Acute upper respiratory infection, unspecified: Secondary | ICD-10-CM | POA: Diagnosis not present

## 2023-11-02 DIAGNOSIS — R413 Other amnesia: Secondary | ICD-10-CM | POA: Diagnosis not present

## 2023-11-02 DIAGNOSIS — E538 Deficiency of other specified B group vitamins: Secondary | ICD-10-CM

## 2023-11-02 DIAGNOSIS — R300 Dysuria: Secondary | ICD-10-CM

## 2023-11-02 DIAGNOSIS — R051 Acute cough: Secondary | ICD-10-CM

## 2023-11-02 MED ORDER — CEFUROXIME AXETIL 250 MG PO TABS: 250.0000 mg | ORAL_TABLET | Freq: Two times a day (BID) | ORAL | 0 refills | Status: AC

## 2023-11-02 MED ORDER — PHENAZOPYRIDINE HCL 100 MG PO TABS: 100.0000 mg | ORAL_TABLET | Freq: Three times a day (TID) | ORAL | 1 refills | Status: DC | PRN

## 2023-11-02 MED ORDER — FLUCONAZOLE 150 MG PO TABS: 150.0000 mg | ORAL_TABLET | Freq: Once | ORAL | 1 refills | Status: AC

## 2023-11-02 MED ORDER — PROMETHAZINE-DM 6.25-15 MG/5ML PO SYRP: 5.0000 mL | ORAL_SOLUTION | Freq: Four times a day (QID) | ORAL | 0 refills | Status: DC | PRN

## 2023-11-02 NOTE — Assessment & Plan Note (Signed)
 Cough syrup with promethazine 

## 2023-11-02 NOTE — Assessment & Plan Note (Signed)
 Pt took Z pack - developed painful urination x 2 last days and saw blood in the urine Check UA D/c Zpack

## 2023-11-02 NOTE — Progress Notes (Signed)
 Subjective:  Patient ID: Ellen [name], female    DOB: 03/17/1950  Age: 73 y.o. MRN: [id number]  CC: Joint Pain (Joint pain, especially in the extremities. Recently started prolia  injection and wants to make sure it isn't related to that)   HPI Mack L Trueheart presents for URI  Pt took Z pack - developed painful urination x 2 last days and saw blood in the urine  Outpatient Medications Prior to Visit  Medication Sig Dispense Refill   aspirin  EC 81 MG tablet Take 1 tablet (81 mg total) by mouth daily.     Calcium Carb-Cholecalciferol (CALCIUM + D3 PO) Take by mouth. Calcium 600mg  & vit d3 2000 iu     citalopram  (CELEXA ) 20 MG tablet Take 1 tablet by mouth once daily 90 tablet 3   CRANBERRY EXTRACT PO Take 1 capsule by mouth daily.      Cyanocobalamin  (B-12) 5000 MCG SUBL 1 tab sl qd 100 tablet 3   donepezil  (ARICEPT ) 5 MG tablet Take 1 tablet (5 mg total) by mouth at bedtime. 90 tablet 3   fexofenadine (ALLEGRA) 180 MG tablet Take 180 mg by mouth daily.     levothyroxine  (SYNTHROID ) 112 MCG tablet Take 1 tablet (112 mcg total) by mouth daily. 90 tablet 3   Multiple Vitamin (MULTIVITAMIN) tablet Take 1 tablet by mouth daily.     pantoprazole  (PROTONIX ) 40 MG tablet Take 1 tablet by mouth once daily 90 tablet 3   Probiotic Product (PROBIOTIC PO) Take by mouth. 60 billion     traZODone  (DESYREL ) 50 MG tablet Take 2 tablets (100 mg total) by mouth at bedtime. 180 tablet 3   triamcinolone  ointment (KENALOG ) 0.5 % Use qid prn 90 g 3   trimethoprim -polymyxin b  (POLYTRIM ) ophthalmic solution Place 1 drop into the left eye every 6 (six) hours. 10 mL 0   valACYclovir  (VALTREX ) 500 MG tablet Take 1 tablet (500 mg total) by mouth 2 (two) times daily. 14 tablet 2   Valerian Root 100 MG CAPS Take 100 mg by mouth at bedtime.     ofloxacin (OCUFLOX) 0.3 % ophthalmic solution 1 drop 3 (three) times daily. (Patient not taking: Reported on 11/02/2023)     azithromycin  (ZITHROMAX  Z-PAK) 250 MG tablet Take 2  tablets on day 1, then 1 daily until finished (Patient not taking: Reported on 11/02/2023) 6 tablet 0   No facility-administered medications prior to visit.    ROS: Review of Systems  Constitutional:  Negative for activity change, appetite change, chills, fatigue and unexpected weight change.  HENT:  Positive for congestion. Negative for mouth sores and sinus pressure.   Eyes:  Negative for visual disturbance.  Respiratory:  Positive for cough. Negative for chest tightness.   Gastrointestinal:  Negative for abdominal pain and nausea.  Genitourinary:  Positive for dysuria and hematuria. Negative for difficulty urinating, frequency, vaginal bleeding and vaginal pain.  Musculoskeletal:  Negative for back pain and gait problem.  Skin:  Negative for pallor and rash.  Neurological:  Negative for dizziness, tremors, weakness, numbness and headaches.  Psychiatric/Behavioral:  Negative for confusion and sleep disturbance.     Objective:  BP 118/82   Pulse 69   Temp 98.7 F (37.1 C)   Ht 5' 1.5 (1.562 m)   Wt 138 lb 6.4 oz (62.8 kg)   SpO2 99%   BMI 25.73 kg/m   BP Readings from Last 3 Encounters:  11/02/23 118/82  09/16/23 116/78  09/09/23 124/86    Wt Readings  from Last 3 Encounters:  11/02/23 138 lb 6.4 oz (62.8 kg)  09/16/23 135 lb 9.6 oz (61.5 kg)  09/09/23 136 lb (61.7 kg)    Physical Exam Constitutional:      General: She is not in acute distress.    Appearance: She is well-developed.  HENT:     Head: Normocephalic.     Right Ear: External ear normal.     Left Ear: External ear normal.     Nose: Nose normal.  Eyes:     General:        Right eye: No discharge.        Left eye: No discharge.     Conjunctiva/sclera: Conjunctivae normal.     Pupils: Pupils are equal, round, and reactive to light.  Neck:     Thyroid : No thyromegaly.     Vascular: No JVD.     Trachea: No tracheal deviation.  Cardiovascular:     Rate and Rhythm: Normal rate and regular rhythm.      Heart sounds: Normal heart sounds.  Pulmonary:     Effort: No respiratory distress.     Breath sounds: No stridor. No wheezing.  Abdominal:     General: Bowel sounds are normal. There is no distension.     Palpations: Abdomen is soft. There is no mass.     Tenderness: There is no abdominal tenderness. There is no guarding or rebound.  Musculoskeletal:        General: No tenderness.     Cervical back: Normal range of motion and neck supple. No rigidity.  Lymphadenopathy:     Cervical: No cervical adenopathy.  Skin:    Findings: No erythema or rash.  Neurological:     Cranial Nerves: No cranial nerve deficit.     Motor: No abnormal muscle tone.     Coordination: Coordination normal.     Deep Tendon Reflexes: Reflexes normal.  Psychiatric:        Behavior: Behavior normal.        Thought Content: Thought content normal.        Judgment: Judgment normal.     Lab Results  Component Value Date   WBC 10.5 10/21/2023   HGB 13.2 10/21/2023   HCT 40.5 10/21/2023   PLT 270.0 10/21/2023   GLUCOSE 90 10/21/2023   CHOL 187 10/21/2023   TRIG 107.0 10/21/2023   HDL 62.40 10/21/2023   LDLDIRECT 142.6 07/09/2011   LDLCALC 103 (H) 10/21/2023   ALT 15 10/21/2023   AST 24 10/21/2023   NA 138 10/21/2023   K 4.3 10/21/2023   CL 103 10/21/2023   CREATININE 0.76 10/21/2023   BUN 11 10/21/2023   CO2 28 10/21/2023   TSH 1.15 10/21/2023   INR 0.9 10/16/2020   HGBA1C 5.6 10/12/2006    MR BRAIN WO CONTRAST Result Date: 06/23/2023 EXAM DESCRIPTION: MR BRAIN WO CONTRAST CLINICAL HISTORY: memory impariment COMPARISON: None available TECHNIQUE: Non contrast MRI of the brain is performed according to our usual protocol including multiplanar multi sequence technique. FINDINGS: No acute intracranial hemorrhage, mass, edema, or hydrocephalus. No significant cortical atrophy. No encephalomalacia. Mild white matter disease suggesting chronic small vessel ischemic change. The vascular flow voids are  unremarkable. No significant sinus disease. IMPRESSION: Mild age-related change without acute intracranial pathology. Electronically signed by: Reyes Frees MD 06/23/2023 01:33 AM EDT RP Workstation: [id number]    Assessment & Plan:   Problem List Items Addressed This Visit     B12 deficiency   On  B12      Cough   Cough syrup with promethazine       Dysuria   Rule out UTI.  Obtain urinalysis.  Ceftin  for 5 days if positive UA Empiric Diflucan p.o.      Gross hematuria - Primary   Pt took Z pack - developed painful urination x 2 last days and saw blood in the urine Check UA D/c Zpack      Relevant Orders   Urinalysis   Memory change   Upper respiratory infection   Pt took Z pack - developed painful urination x 2 last days and saw blood in the urine Check UA D/c Zpack      Relevant Medications   fluconazole (DIFLUCAN) 150 MG tablet   cefUROXime  (CEFTIN ) 250 MG tablet      Meds ordered this encounter  Medications   fluconazole (DIFLUCAN) 150 MG tablet    Sig: Take 1 tablet (150 mg total) by mouth once for 1 dose.    Dispense:  1 tablet    Refill:  1   promethazine -dextromethorphan (PROMETHAZINE -DM) 6.25-15 MG/5ML syrup    Sig: Take 5 mLs by mouth 4 (four) times daily as needed for cough.    Dispense:  240 mL    Refill:  0   phenazopyridine  (PYRIDIUM ) 100 MG tablet    Sig: Take 1 tablet (100 mg total) by mouth 3 (three) times daily as needed for pain.    Dispense:  12 tablet    Refill:  1   cefUROXime  (CEFTIN ) 250 MG tablet    Sig: Take 1 tablet (250 mg total) by mouth 2 (two) times daily with a meal for 10 days.    Dispense:  10 tablet    Refill:  0      Follow-up: Return in about 3 months (around 02/02/2024) for a follow-up visit.  Marolyn Noel, MD

## 2023-11-02 NOTE — Telephone Encounter (Signed)
 AEX 07/16/23  Tiffay -please review and advise if appropriate and if OV needed to discuss.

## 2023-11-02 NOTE — Assessment & Plan Note (Signed)
 Rule out UTI.  Obtain urinalysis.  Ceftin  for 5 days if positive UA Empiric Diflucan p.o.

## 2023-11-02 NOTE — Assessment & Plan Note (Signed)
 On B12

## 2023-11-03 ENCOUNTER — Telehealth: Payer: Self-pay

## 2023-11-03 LAB — URINALYSIS, ROUTINE W REFLEX MICROSCOPIC
Bilirubin Urine: NEGATIVE
Ketones, ur: NEGATIVE
Nitrite: NEGATIVE
Specific Gravity, Urine: 1.02 (ref 1.000–1.030)
Total Protein, Urine: NEGATIVE
Urine Glucose: NEGATIVE
Urobilinogen, UA: 0.2 (ref 0.0–1.0)
pH: 6 (ref 5.0–8.0)

## 2023-11-03 NOTE — Telephone Encounter (Signed)
 Copied from CRM [id number]. Topic: Clinical - Medication Question >> Nov 03, 2023  1:29 PM Alfonso ORN wrote: Reason for CRM: pt prescribed rx for possible uti but she wants to make sure test is positive prior to starting the rx. Please confirm with patient once reviewed.

## 2023-11-04 ENCOUNTER — Ambulatory Visit: Payer: Self-pay | Admitting: Internal Medicine

## 2023-11-04 ENCOUNTER — Telehealth: Payer: Self-pay

## 2023-11-04 DIAGNOSIS — R197 Diarrhea, unspecified: Secondary | ICD-10-CM | POA: Diagnosis not present

## 2023-11-04 NOTE — Telephone Encounter (Signed)
 Copied from CRM [id number]. Topic: Clinical - Prescription Issue >> Nov 03, 2023  4:49 PM Vena HERO wrote: Reason for CRM: Catrina from  [facility] pharmacy called in to get clarification on a script sent over today for cefUROXime  (CEFTIN ) 250 MG tablet. Instructions are for pt to take 1 pill twice a day for 10 but supply is only 10 which gives patient 5 days. Please send clarification or call pharmacy

## 2023-11-05 NOTE — Telephone Encounter (Signed)
 5 days is correct.  Thank you

## 2023-11-05 NOTE — Telephone Encounter (Signed)
 I was able to speak with the pt and inform her of correct days/dose to take.  Pt has stated understanding and has no questions or concerns at this time.

## 2023-11-09 ENCOUNTER — Ambulatory Visit (INDEPENDENT_AMBULATORY_CARE_PROVIDER_SITE_OTHER): Admitting: Otolaryngology

## 2023-11-09 ENCOUNTER — Ambulatory Visit (INDEPENDENT_AMBULATORY_CARE_PROVIDER_SITE_OTHER): Admitting: Audiology

## 2023-11-17 DIAGNOSIS — K080 Exfoliation of teeth due to systemic causes: Secondary | ICD-10-CM | POA: Diagnosis not present

## 2023-11-23 ENCOUNTER — Ambulatory Visit: Admitting: Internal Medicine

## 2023-11-23 NOTE — Progress Notes (Unsigned)
 [name] [address] [place] [address] Phone: [phone] Subjective:   [name] am a scribe for Dr. Claudene.   I'm seeing this patient by the request  of:  Plotnikov, Karlynn GAILS, MD  CC: Bilateral knee pain  YEP:Dlagzrupcz  09/09/2023 Injections given again today, tolerated the procedure well, worsening pain I do think patient would be a good candidate for viscosupplementation.  Would not be due though again until January.  Discussed that we could potentially consider the possibility of home exercises otherwise, repeating steroid injection in 3 months as well.  Follow-up again in 3 months      Update 11/26/2023 Arin L Rundell is a 73 y.o. female coming in with complaint of B knee pain.  Has been 10 weeks since previous injections.  Patient states knees are hanging in. Wanted to talk to the doctor about Prolia . Having a lot of back pain and shoulder pain. States that this is a side effect of the Prolia . Wants to talk about alternative inflammation and joint therapy.    Since we have seen patient has had hematuria as well as significant diarrhea that likely was infectious etiology.    Past Medical History:  Diagnosis Date   ALLERGIC RHINITIS    Allergy 8 yrs of age   After moving to a different place with dif vegetation   ANXIETY    Arthritis    Asthma    Cataract    Removed 2023 by R. Glendia Gaudy opthalmologist   GERD    HSV infection    oral   Hypothyroidism    INSOMNIA-SLEEP DISORDER-UNSPEC    Osteopenia 01/2016   T score -1.2 FRAX 15%/0.9%   Shingles    Thyroid  disease    Ulcer 2019   Past Surgical History:  Procedure Laterality Date   ANKLE FRACTURE SURGERY  2012   R   APPENDECTOMY     BREAST SURGERY     REDUCTION MAMMOPLASTY   CESAREAN SECTION     twice   COSMETIC SURGERY     EYE SURGERY  2023   Both eyes   FACELIFT     s/p   FRACTURE SURGERY  2012   HIP SURGERY Right 12/08/2019   Patient reported    JOINT REPLACEMENT  2022, 2023   Rt and Lf hip replacements   TONSILLECTOMY  1974   TOTAL HIP ARTHROPLASTY Left 10/16/2020   Procedure: LEFT TOTAL HIP ARTHROPLASTY ANTERIOR APPROACH;  Surgeon: Sheril Coy, MD;  Location: [facility];  Service: Orthopedics;  Laterality: Left;   Upper nl TSH w/sx     VAGINAL HYSTERECTOMY  1991   TVH, irregular bleeding   Social History   Socioeconomic History   Marital status: Married    Spouse name: Not on file   Number of children: 2   Years of education: 14   Highest education level: Some college, no degree  Occupational History   Occupation: Retired    Associate Professor: SELF EMPLOYED  Tobacco Use   Smoking status: Never   Smokeless tobacco: Never  Vaping Use   Vaping status: Never Used  Substance and Sexual Activity   Alcohol use: Not Currently   Drug use: No   Sexual activity: Yes    Birth control/protection: Post-menopausal, Surgical    Comment: HYST-1st intercourse 73 yo-Fewer than 5 partners  Other Topics Concern   Not on file  Social History Narrative   Regular exercise2 biol children-1 disabled with schizophrenia & 1 stepchild  2 story home   Drinks caffeine   Married 31.5 years   Social Drivers of Corporate Investment Banker Strain: Low Risk  (10/30/2023)   Overall Financial Resource Strain (CARDIA)    Difficulty of Paying Living Expenses: Not hard at all  Food Insecurity: No Food Insecurity (10/30/2023)   Hunger Vital Sign    Worried About Running Out of Food in the Last Year: Never true    Ran Out of Food in the Last Year: Never true  Transportation Needs: No Transportation Needs (10/30/2023)   PRAPARE - Administrator, Civil Service (Medical): No    Lack of Transportation (Non-Medical): No  Physical Activity: Sufficiently Active (10/30/2023)   Exercise Vital Sign    Days of Exercise per Week: 5 days    Minutes of Exercise per Session: 30 min  Recent Concern: Physical Activity - Insufficiently Active (09/15/2023)    Exercise Vital Sign    Days of Exercise per Week: 3 days    Minutes of Exercise per Session: 40 min  Stress: No Stress Concern Present (10/30/2023)   [employer] of Occupational Health - Occupational Stress Questionnaire    Feeling of Stress: Not at all  Social Connections: Moderately Integrated (10/30/2023)   Social Connection and Isolation Panel    Frequency of Communication with Friends and Family: More than three times a week    Frequency of Social Gatherings with Friends and Family: More than three times a week    Attends Religious Services: Patient declined    Database Administrator or Organizations: Yes    Attends Engineer, Structural: More than 4 times per year    Marital Status: Married   Allergies  Allergen Reactions   Belviq [Lorcaserin  Hcl] Diarrhea   Demerol [Meperidine] Other (See Comments)    Palpitations. Many years ago.   Elavil  [Amitriptyline  Hcl] Other (See Comments)    constipation   Hydrocodone      Confusion, a very low BP after taking Oxycodone  and Norco after hip surgery, would get very orthostatic, syncopal - Oct 2022 and Dec 2021  Can take Tramadol    Oxycodone  Other (See Comments)    Confusion, a very low BP after taking Oxycodone  and Norco after hip surgery, would get very orthostatic, syncopal - Oct 2022 and Dec 2021  Can take Tramadol    Peanut Oil Itching   Shellfish Protein-Containing Drug Products Itching   Zithromax  [Azithromycin ]     ?blood in urine, pain   Latex Hives, Itching and Rash    Redness    Medical Adhesive Remover Hives, Itching and Rash   Family History  Problem Relation Age of Onset   Heart disease Father        CAD/CABG in his 51's   Arthritis Father    Hypertension Mother    Heart failure Mother    Anxiety disorder Mother    Arthritis Mother    Hypertension Maternal Grandmother    Anxiety disorder Brother    Arthritis Brother     Current Outpatient Medications (Endocrine & Metabolic):    levothyroxine   (SYNTHROID ) 112 MCG tablet, Take 1 tablet (112 mcg total) by mouth daily.   Current Outpatient Medications (Respiratory):    fexofenadine (ALLEGRA) 180 MG tablet, Take 180 mg by mouth daily.   promethazine -dextromethorphan (PROMETHAZINE -DM) 6.25-15 MG/5ML syrup, Take 5 mLs by mouth 4 (four) times daily as needed for cough.  Current Outpatient Medications (Analgesics):    celecoxib (CELEBREX) 100 MG capsule, Take 1 capsule (100  mg total) by mouth 2 (two) times daily.   aspirin  EC 81 MG tablet, Take 1 tablet (81 mg total) by mouth daily.  Current Outpatient Medications (Hematological):    Cyanocobalamin  (B-12) 5000 MCG SUBL, 1 tab sl qd  Current Outpatient Medications (Other):    Calcium Carb-Cholecalciferol (CALCIUM + D3 PO), Take by mouth. Calcium 600mg  & vit d3 2000 iu   citalopram  (CELEXA ) 20 MG tablet, Take 1 tablet by mouth once daily   CRANBERRY EXTRACT PO, Take 1 capsule by mouth daily.    donepezil  (ARICEPT ) 5 MG tablet, Take 1 tablet (5 mg total) by mouth at bedtime.   Multiple Vitamin (MULTIVITAMIN) tablet, Take 1 tablet by mouth daily.   ofloxacin (OCUFLOX) 0.3 % ophthalmic solution, 1 drop 3 (three) times daily. (Patient not taking: Reported on 11/02/2023)   pantoprazole  (PROTONIX ) 40 MG tablet, Take 1 tablet by mouth once daily   phenazopyridine  (PYRIDIUM ) 100 MG tablet, Take 1 tablet (100 mg total) by mouth 3 (three) times daily as needed for pain.   Probiotic Product (PROBIOTIC PO), Take by mouth. 60 billion   traZODone  (DESYREL ) 50 MG tablet, Take 2 tablets (100 mg total) by mouth at bedtime.   triamcinolone  ointment (KENALOG ) 0.5 %, Use qid prn   trimethoprim -polymyxin b  (POLYTRIM ) ophthalmic solution, Place 1 drop into the left eye every 6 (six) hours.   valACYclovir  (VALTREX ) 500 MG tablet, Take 1 tablet (500 mg total) by mouth 2 (two) times daily.   Valerian Root 100 MG CAPS, Take 100 mg by mouth at bedtime.   Reviewed prior external information including notes and  imaging from  primary care provider As well as notes that were available from care everywhere and other healthcare systems.  Past medical history, social, surgical and family history all reviewed in electronic medical record.  No pertanent information unless stated regarding to the chief complaint.   Review of Systems:  No headache, visual changes, nausea, vomiting, diarrhea, constipation, dizziness, abdominal pain, skin rash, fevers, chills, night sweats, weight loss, swollen lymph nodes, body aches, joint swelling, chest pain, shortness of breath, mood changes. POSITIVE muscle aches  Objective  Blood pressure 100/60, pulse 68, height 5' 1.5 (1.562 m), weight 137 lb (62.1 kg), SpO2 98%.   General: No apparent distress alert and oriented x3 mood and affect normal, dressed appropriately.  HEENT: Pupils equal, extraocular movements intact  Respiratory: Patient's speak in full sentences and does not appear short of breath  Cardiovascular: No lower extremity edema, non tender, no erythema  Knee exam shows the patient does have some instability of the knees bilaterally.  Right greater than left.  Mild crepitus noted.  Patient seems to have more narrowing on the lateral aspect of the right knee.  Right shoulder does have a positive impingement noted.  Positive Neer and Hawking's.  4 out of 5 strength but symmetric to the contralateral side  After informed written and verbal consent, patient was seated on exam table. Right shoulder was prepped with alcohol swab and utilizing posterior approach, patient's right glenohumeral space was injected with 4:1  marcaine  0.5%: Kenalog  40mg /dL. Patient tolerated the procedure well without immediate complications.    Impression and Recommendations:    The above documentation has been reviewed and is accurate and complete Latrelle Fuston M Chairty Toman, DO

## 2023-11-26 ENCOUNTER — Ambulatory Visit: Admitting: Family Medicine

## 2023-11-26 ENCOUNTER — Encounter: Payer: Self-pay | Admitting: Family Medicine

## 2023-11-26 ENCOUNTER — Ambulatory Visit (INDEPENDENT_AMBULATORY_CARE_PROVIDER_SITE_OTHER)

## 2023-11-26 VITALS — BP 100/60 | HR 68 | Ht 61.5 in | Wt 137.0 lb

## 2023-11-26 DIAGNOSIS — M25511 Pain in right shoulder: Secondary | ICD-10-CM | POA: Diagnosis not present

## 2023-11-26 DIAGNOSIS — M170 Bilateral primary osteoarthritis of knee: Secondary | ICD-10-CM

## 2023-11-26 MED ORDER — CELECOXIB 100 MG PO CAPS: 100.0000 mg | ORAL_CAPSULE | Freq: Two times a day (BID) | ORAL | 3 refills | Status: DC

## 2023-11-26 NOTE — Patient Instructions (Addendum)
 Good to see you. X-ray of right shoulder Celebrex  100 bid 3 refills Take two Tums with Prolia  injection. See me again in 3 months.

## 2023-11-26 NOTE — Assessment & Plan Note (Addendum)
 Stable at the moment and held on any injection.  Can do the possibility of viscosupplementation again if needed.  Discussed icing regimen.  Follow-up again in 6 to 12 weeks Celebrex 100 mg twice a day also prescribed

## 2023-11-26 NOTE — Assessment & Plan Note (Signed)
 Injection given and tolerated the procedure well, improvement in range of motion almost immediately.  Seems to be more of an impingement syndrome with likely a subacromial bursitis.  Rotator cuff strength appeared to be intact but 4-5 but relatively symmetric to the contralateral side.  Patient given some home exercises.  Worsening pain advanced imaging would be warranted.  Follow-up again in 6 to 8 weeks

## 2023-12-01 ENCOUNTER — Ambulatory Visit: Payer: Self-pay | Admitting: Family Medicine

## 2023-12-08 ENCOUNTER — Ambulatory Visit: Admitting: Family Medicine

## 2023-12-09 ENCOUNTER — Ambulatory Visit: Admitting: Family Medicine

## 2023-12-13 ENCOUNTER — Encounter: Payer: Self-pay | Admitting: Family Medicine

## 2023-12-13 DIAGNOSIS — G8929 Other chronic pain: Secondary | ICD-10-CM

## 2023-12-14 DIAGNOSIS — M25562 Pain in left knee: Secondary | ICD-10-CM | POA: Diagnosis not present

## 2023-12-14 DIAGNOSIS — K080 Exfoliation of teeth due to systemic causes: Secondary | ICD-10-CM | POA: Diagnosis not present

## 2023-12-14 DIAGNOSIS — R2689 Other abnormalities of gait and mobility: Secondary | ICD-10-CM | POA: Diagnosis not present

## 2023-12-14 DIAGNOSIS — M25561 Pain in right knee: Secondary | ICD-10-CM | POA: Diagnosis not present

## 2023-12-14 DIAGNOSIS — M170 Bilateral primary osteoarthritis of knee: Secondary | ICD-10-CM | POA: Diagnosis not present

## 2023-12-15 ENCOUNTER — Other Ambulatory Visit

## 2023-12-15 ENCOUNTER — Other Ambulatory Visit: Payer: Self-pay

## 2023-12-15 ENCOUNTER — Other Ambulatory Visit: Payer: Self-pay | Admitting: Family Medicine

## 2023-12-15 ENCOUNTER — Ambulatory Visit: Payer: Self-pay | Admitting: Family Medicine

## 2023-12-15 DIAGNOSIS — M2550 Pain in unspecified joint: Secondary | ICD-10-CM | POA: Diagnosis not present

## 2023-12-15 DIAGNOSIS — G8929 Other chronic pain: Secondary | ICD-10-CM

## 2023-12-15 DIAGNOSIS — N390 Urinary tract infection, site not specified: Secondary | ICD-10-CM

## 2023-12-15 LAB — CBC WITH DIFFERENTIAL/PLATELET
Basophils Absolute: 0.1 K/uL (ref 0.0–0.1)
Basophils Relative: 0.6 % (ref 0.0–3.0)
Eosinophils Absolute: 0.3 K/uL (ref 0.0–0.7)
Eosinophils Relative: 3 % (ref 0.0–5.0)
HCT: 39.6 % (ref 36.0–46.0)
Hemoglobin: 13.4 g/dL (ref 12.0–15.0)
Lymphocytes Relative: 16.1 % (ref 12.0–46.0)
Lymphs Abs: 1.5 K/uL (ref 0.7–4.0)
MCHC: 33.8 g/dL (ref 30.0–36.0)
MCV: 92.5 fl (ref 78.0–100.0)
Monocytes Absolute: 0.7 K/uL (ref 0.1–1.0)
Monocytes Relative: 7.4 % (ref 3.0–12.0)
Neutro Abs: 6.8 K/uL (ref 1.4–7.7)
Neutrophils Relative %: 72.9 % (ref 43.0–77.0)
Platelets: 289 K/uL (ref 150.0–400.0)
RBC: 4.28 Mil/uL (ref 3.87–5.11)
RDW: 13.2 % (ref 11.5–15.5)
WBC: 9.3 K/uL (ref 4.0–10.5)

## 2023-12-15 LAB — COMPREHENSIVE METABOLIC PANEL WITH GFR
ALT: 11 U/L (ref 0–35)
AST: 17 U/L (ref 0–37)
Albumin: 3.7 g/dL (ref 3.5–5.2)
Alkaline Phosphatase: 76 U/L (ref 39–117)
BUN: 9 mg/dL (ref 6–23)
CO2: 26 meq/L (ref 19–32)
Calcium: 8.7 mg/dL (ref 8.4–10.5)
Chloride: 105 meq/L (ref 96–112)
Creatinine, Ser: 0.65 mg/dL (ref 0.40–1.20)
GFR: 86.24 mL/min (ref >60.00)
Glucose, Bld: 99 mg/dL (ref 70–99)
Potassium: 3.9 meq/L (ref 3.5–5.1)
Sodium: 140 meq/L (ref 135–145)
Total Bilirubin: 0.3 mg/dL (ref 0.2–1.2)
Total Protein: 7 g/dL (ref 6.0–8.3)

## 2023-12-15 LAB — URINALYSIS, ROUTINE W REFLEX MICROSCOPIC
Bilirubin Urine: NEGATIVE
Hgb urine dipstick: NEGATIVE
Nitrite: NEGATIVE
Specific Gravity, Urine: 1.015 (ref 1.000–1.030)
Total Protein, Urine: NEGATIVE
Urine Glucose: NEGATIVE
Urobilinogen, UA: 0.2 (ref 0.0–1.0)
pH: 6 (ref 5.0–8.0)

## 2023-12-15 LAB — URIC ACID: Uric Acid, Serum: 4.3 mg/dL (ref 2.4–7.0)

## 2023-12-15 LAB — TSH: TSH: 2.04 u[IU]/mL (ref 0.35–5.50)

## 2023-12-15 LAB — C-REACTIVE PROTEIN: CRP: 3 mg/dL (ref 0.5–20.0)

## 2023-12-15 LAB — SEDIMENTATION RATE: Sed Rate: 61 mm/h — ABNORMAL HIGH (ref 0–30)

## 2023-12-15 MED ORDER — CEPHALEXIN 500 MG PO CAPS: 500.0000 mg | ORAL_CAPSULE | Freq: Three times a day (TID) | ORAL | 0 refills | Status: DC

## 2023-12-17 ENCOUNTER — Emergency Department (HOSPITAL_BASED_OUTPATIENT_CLINIC_OR_DEPARTMENT_OTHER)
Admission: EM | Admit: 2023-12-17 | Discharge: 2023-12-17 | Disposition: A | Discharge status: Home / Self Care | Attending: Emergency Medicine | Admitting: Emergency Medicine

## 2023-12-17 ENCOUNTER — Emergency Department (HOSPITAL_BASED_OUTPATIENT_CLINIC_OR_DEPARTMENT_OTHER)

## 2023-12-17 ENCOUNTER — Encounter: Payer: Self-pay | Admitting: Internal Medicine

## 2023-12-17 ENCOUNTER — Other Ambulatory Visit: Payer: Self-pay

## 2023-12-17 ENCOUNTER — Encounter (HOSPITAL_BASED_OUTPATIENT_CLINIC_OR_DEPARTMENT_OTHER): Payer: Self-pay | Admitting: Emergency Medicine

## 2023-12-17 DIAGNOSIS — I10 Essential (primary) hypertension: Secondary | ICD-10-CM | POA: Diagnosis not present

## 2023-12-17 DIAGNOSIS — J168 Pneumonia due to other specified infectious organisms: Secondary | ICD-10-CM | POA: Diagnosis not present

## 2023-12-17 DIAGNOSIS — E039 Hypothyroidism, unspecified: Secondary | ICD-10-CM | POA: Diagnosis not present

## 2023-12-17 DIAGNOSIS — J181 Lobar pneumonia, unspecified organism: Secondary | ICD-10-CM | POA: Diagnosis not present

## 2023-12-17 DIAGNOSIS — Z91010 Allergy to peanuts: Secondary | ICD-10-CM | POA: Insufficient documentation

## 2023-12-17 DIAGNOSIS — R918 Other nonspecific abnormal finding of lung field: Secondary | ICD-10-CM | POA: Diagnosis not present

## 2023-12-17 DIAGNOSIS — Z7982 Long term (current) use of aspirin: Secondary | ICD-10-CM | POA: Insufficient documentation

## 2023-12-17 DIAGNOSIS — Z79899 Other long term (current) drug therapy: Secondary | ICD-10-CM | POA: Insufficient documentation

## 2023-12-17 DIAGNOSIS — J189 Pneumonia, unspecified organism: Secondary | ICD-10-CM

## 2023-12-17 DIAGNOSIS — Z91040 Latex allergy status: Secondary | ICD-10-CM | POA: Insufficient documentation

## 2023-12-17 DIAGNOSIS — R079 Chest pain, unspecified: Secondary | ICD-10-CM | POA: Diagnosis present

## 2023-12-17 DIAGNOSIS — R0789 Other chest pain: Secondary | ICD-10-CM | POA: Diagnosis not present

## 2023-12-17 LAB — COMPREHENSIVE METABOLIC PANEL WITH GFR
ALT: 11 U/L (ref 0–44)
AST: 22 U/L (ref 15–41)
Albumin: 3.8 g/dL (ref 3.5–5.0)
Alkaline Phosphatase: 78 U/L (ref 38–126)
Anion gap: 13 (ref 5–15)
BUN: 10 mg/dL (ref 8–23)
CO2: 21 mmol/L — ABNORMAL LOW (ref 22–32)
Calcium: 9.5 mg/dL (ref 8.9–10.3)
Chloride: 104 mmol/L (ref 98–111)
Creatinine, Ser: 0.61 mg/dL (ref 0.44–1.00)
GFR, Estimated: >60 mL/min (ref >60)
Glucose, Bld: 84 mg/dL (ref 70–99)
Potassium: 4.3 mmol/L (ref 3.5–5.1)
Sodium: 138 mmol/L (ref 135–145)
Total Bilirubin: 0.3 mg/dL (ref 0.0–1.2)
Total Protein: 7 g/dL (ref 6.5–8.1)

## 2023-12-17 LAB — CBC
HCT: 39.1 % (ref 36.0–46.0)
Hemoglobin: 13.2 g/dL (ref 12.0–15.0)
MCH: 30.7 pg (ref 26.0–34.0)
MCHC: 33.8 g/dL (ref 30.0–36.0)
MCV: 90.9 fL (ref 80.0–100.0)
Platelets: 310 K/uL (ref 150–400)
RBC: 4.3 MIL/uL (ref 3.87–5.11)
RDW: 12.7 % (ref 11.5–15.5)
WBC: 9.7 K/uL (ref 4.0–10.5)
nRBC: 0 % (ref 0.0–0.2)

## 2023-12-17 LAB — CBG MONITORING, ED: Glucose-Capillary: 86 mg/dL (ref 70–99)

## 2023-12-17 LAB — URINALYSIS, ROUTINE W REFLEX MICROSCOPIC
Bilirubin Urine: NEGATIVE
Glucose, UA: NEGATIVE mg/dL
Hgb urine dipstick: NEGATIVE
Ketones, ur: NEGATIVE mg/dL
Leukocytes,Ua: NEGATIVE
Nitrite: NEGATIVE
Protein, ur: NEGATIVE mg/dL
Specific Gravity, Urine: 1.006 (ref 1.005–1.030)
pH: 7 (ref 5.0–8.0)

## 2023-12-17 LAB — URINE CULTURE

## 2023-12-17 LAB — RESP PANEL BY RT-PCR (RSV, FLU A&B, COVID)  RVPGX2
Influenza A by PCR: NEGATIVE
Influenza B by PCR: NEGATIVE
Resp Syncytial Virus by PCR: NEGATIVE
SARS Coronavirus 2 by RT PCR: NEGATIVE

## 2023-12-17 LAB — TROPONIN T, HIGH SENSITIVITY
Troponin T High Sensitivity: <15 ng/L (ref 0–19)
Troponin T High Sensitivity: <15 ng/L (ref 0–19)

## 2023-12-17 MED ORDER — AMOXICILLIN-POT CLAVULANATE 875-125 MG PO TABS: 1.0000 | ORAL_TABLET | Freq: Two times a day (BID) | ORAL | 0 refills | Status: DC

## 2023-12-17 MED ORDER — ASPIRIN 81 MG PO CHEW
324.0000 mg | CHEWABLE_TABLET | Freq: Once | ORAL | Status: AC
  Administered 2023-12-17: 324 mg via ORAL
  Filled 2023-12-17: qty 4

## 2023-12-17 MED ORDER — CEPHALEXIN 250 MG PO CAPS
500.0000 mg | ORAL_CAPSULE | Freq: Once | ORAL | Status: AC
  Administered 2023-12-17: 500 mg via ORAL
  Filled 2023-12-17: qty 2

## 2023-12-17 NOTE — Discharge Instructions (Addendum)
 Return if any problems.  See your Physician for recheck next week.  Stop taking Cephalexin .

## 2023-12-17 NOTE — ED Notes (Signed)
 ED Provider at bedside.

## 2023-12-17 NOTE — ED Notes (Signed)
 Reviewed AVS/discharge instructions with patient. Time allotted for and all questions answered. Patient is agreeable for d/c and escorted to ED exit by staff.

## 2023-12-17 NOTE — ED Triage Notes (Signed)
 Chest pain started yesterday into left arm Joint pains body aches UTI + on abt

## 2023-12-17 NOTE — ED Provider Notes (Signed)
 [place] [facility] AT [facility] Provider Note   CSN: [id number] Arrival date & time: 12/17/23  1105     Patient presents with: Chest Pain   Ellen [name] is a 73 y.o. female.   Pt complains of left sided chest pain.  Pt reports she feels achy.  Pt is currently being treated for a uti.  Pt is taking Keflex .  Pt feels like she has had a fever.  Pt complains of having chills.  Pt reports uti symptoms are improving.  Pt denies any shortness of breath. Pt has had a cough Pt has a history of hypertension, Hypothyroidism and osteoarthritis. Pt is here with her husband who is supportive   The history is provided by the patient and the spouse.  Chest Pain      Prior to Admission medications  Medication Sig Start Date End Date Taking? Authorizing Provider  amoxicillin -clavulanate (AUGMENTIN ) 875-125 MG tablet Take 1 tablet by mouth 2 (two) times daily. 12/17/23  Yes Flint Sonny POUR, PA-C  aspirin  EC 81 MG tablet Take 1 tablet (81 mg total) by mouth daily. 07/06/23 07/05/24  Plotnikov, Aleksei V, MD  Calcium Carb-Cholecalciferol (CALCIUM + D3 PO) Take by mouth. Calcium 600mg  & vit d3 2000 iu    [provider]  celecoxib  (CELEBREX ) 100 MG capsule Take 1 capsule (100 mg total) by mouth 2 (two) times daily. 11/26/23   Claudene Arthea HERO, DO  cephALEXin  (KEFLEX ) 500 MG capsule Take 1 capsule (500 mg total) by mouth 3 (three) times daily. 12/15/23   Smith, Zachary M, DO  citalopram  (CELEXA ) 20 MG tablet Take 1 tablet by mouth once daily 09/17/23   Plotnikov, Aleksei V, MD  CRANBERRY EXTRACT PO Take 1 capsule by mouth daily.     [provider]  Cyanocobalamin  (B-12) 5000 MCG SUBL 1 tab sl qd 09/16/23   Plotnikov, Aleksei V, MD  donepezil  (ARICEPT ) 5 MG tablet Take 1 tablet (5 mg total) by mouth at bedtime. 08/23/23   Plotnikov, Aleksei V, MD  fexofenadine (ALLEGRA) 180 MG tablet Take 180 mg by mouth daily.    [provider]  levothyroxine  (SYNTHROID )  112 MCG tablet Take 1 tablet (112 mcg total) by mouth daily. 10/22/23   Plotnikov, Aleksei V, MD  Multiple Vitamin (MULTIVITAMIN) tablet Take 1 tablet by mouth daily.    [provider]  ofloxacin (OCUFLOX) 0.3 % ophthalmic solution 1 drop 3 (three) times daily. Patient not taking: Reported on 11/02/2023 07/21/23   [provider]  pantoprazole  (PROTONIX ) 40 MG tablet Take 1 tablet by mouth once daily 09/15/23   Plotnikov, Aleksei V, MD  phenazopyridine  (PYRIDIUM ) 100 MG tablet Take 1 tablet (100 mg total) by mouth 3 (three) times daily as needed for pain. 11/02/23   Plotnikov, Aleksei V, MD  Probiotic Product (PROBIOTIC PO) Take by mouth. 60 billion    [provider]  promethazine -dextromethorphan (PROMETHAZINE -DM) 6.25-15 MG/5ML syrup Take 5 mLs by mouth 4 (four) times daily as needed for cough. 11/02/23   Plotnikov, Karlynn GAILS, MD  traZODone  (DESYREL ) 50 MG tablet Take 2 tablets (100 mg total) by mouth at bedtime. 10/20/23   Plotnikov, Aleksei V, MD  triamcinolone  ointment (KENALOG ) 0.5 % Use qid prn 07/03/22   Plotnikov, Aleksei V, MD  trimethoprim -polymyxin b  (POLYTRIM ) ophthalmic solution Place 1 drop into the left eye every 6 (six) hours. 06/09/23   Plotnikov, Aleksei V, MD  valACYclovir  (VALTREX ) 500 MG tablet Take 1 tablet (500 mg total) by mouth 2 (  two) times daily. 03/19/23   Plotnikov, Aleksei V, MD  Valerian Root 100 MG CAPS Take 100 mg by mouth at bedtime.    [provider]    Allergies: Belviq [lorcaserin  hcl], Demerol [meperidine], Elavil  [amitriptyline  hcl], Hydrocodone , Oxycodone , Peanut oil, Shellfish protein-containing drug products, Zithromax  [azithromycin ], Latex, and Medical adhesive remover    Review of Systems  Cardiovascular:  Positive for chest pain.  All other systems reviewed and are negative.   Updated Vital Signs BP 128/74 (BP Location: Right Arm)   Pulse (!) 59   Temp 98.1 F (36.7 C) (Oral)   Resp 18   SpO2 99%   Physical  Exam Vitals and nursing note reviewed.  Constitutional:      Appearance: She is well-developed.  HENT:     Head: Normocephalic.  Cardiovascular:     Rate and Rhythm: Normal rate and regular rhythm.     Heart sounds: Normal heart sounds.  Pulmonary:     Effort: Pulmonary effort is normal.     Breath sounds: Normal breath sounds.  Abdominal:     General: Bowel sounds are normal. There is no distension.     Palpations: Abdomen is soft.  Musculoskeletal:        General: Normal range of motion.     Cervical back: Normal range of motion.  Skin:    General: Skin is warm.  Neurological:     General: No focal deficit present.     Mental Status: She is alert and oriented to person, place, and time.     (all labs ordered are listed, but only abnormal results are displayed) Labs Reviewed  COMPREHENSIVE METABOLIC PANEL WITH GFR - Abnormal; Notable for the following components:      Result Value   CO2 21 (*)    All other components within normal limits  URINALYSIS, ROUTINE W REFLEX MICROSCOPIC - Abnormal; Notable for the following components:   Color, Urine COLORLESS (*)    All other components within normal limits  RESP PANEL BY RT-PCR (RSV, FLU A&B, COVID)  RVPGX2  CBC  CBG MONITORING, ED  TROPONIN T, HIGH SENSITIVITY  TROPONIN T, HIGH SENSITIVITY    EKG: EKG Interpretation Date/Time:  Thursday December 17 2023 11:13:35 EST Ventricular Rate:  78 PR Interval:  138 QRS Duration:  91 QT Interval:  371 QTC Calculation: 423 R Axis:   53  Text Interpretation: Sinus rhythm No significant change since last tracing Confirmed by Dean Clarity [id number]) on 12/17/2023 12:16:12 PM  Radiology: DG Chest Portable 1 View Result Date: 12/17/2023 CLINICAL DATA:  Chest pain radiating into the left arm EXAM: PORTABLE CHEST 1 VIEW COMPARISON:  Chest radiograph dated 06/09/2012 FINDINGS: Normal lung volumes. Patchy right basilar opacity. No pleural effusion or pneumothorax. The heart size and  mediastinal contours are within normal limits. No acute osseous abnormality. IMPRESSION: Patchy right basilar opacity, which may represent atelectasis, aspiration, or pneumonia. Electronically Signed   By: Limin  Xu M.D.   On: 12/17/2023 13:26     Procedures   Medications Ordered in the ED  aspirin  chewable tablet 324 mg (324 mg Oral Given 12/17/23 1228)  cephALEXin  (KEFLEX ) capsule 500 mg (500 mg Oral Given 12/17/23 1450)                                    Medical Decision Making Pt complains of chest pain that started yesterday.  Pt reports discomfort into  her left  arm.     Amount and/or Complexity of Data Reviewed Independent Historian: spouse    Details: Pt's husband reports pt feeling bad since yesterday.  External Data Reviewed: notes.    Details: Primary care notes reviewed  Labs: ordered.    Details: Labs ordered reviewed and interpreted influenza flu and COVID are negative.  UA is negative.  CBC and metabolic panel are negative. Radiology: ordered.    Details: Chest x-ray ordered reviewed and interpreted chest x-ray shows patchy right basilar opacity ECG/medicine tests: ordered and independent interpretation performed. Decision-making details documented in ED Course.    Details: EKG shows normal sinus normal EKG  Risk OTC drugs. Prescription drug management. Risk Details: Patient is advised to stop the Keflex .  Patient is given a prescription for Augmentin  to cover for pneumonia.  Patient is advised to follow-up with her primary care physician she is discharged in stable condition.        Final diagnoses:  Pneumonia of right lung due to infectious organism, unspecified part of lung    ED Discharge Orders          Ordered    amoxicillin -clavulanate (AUGMENTIN ) 875-125 MG tablet  2 times daily        12/17/23 1508           An After Visit Summary was printed and given to the patient.     Flint Sonny POUR, [place] 12/17/23 1556    Dean Clarity,  MD 12/18/23 [phone]

## 2023-12-23 ENCOUNTER — Other Ambulatory Visit: Payer: Self-pay

## 2023-12-23 DIAGNOSIS — R079 Chest pain, unspecified: Secondary | ICD-10-CM

## 2024-01-11 ENCOUNTER — Encounter: Payer: Self-pay | Admitting: Internal Medicine

## 2024-01-11 ENCOUNTER — Ambulatory Visit (INDEPENDENT_AMBULATORY_CARE_PROVIDER_SITE_OTHER): Admitting: Internal Medicine

## 2024-01-11 VITALS — BP 108/72 | HR 67 | Ht 61.5 in | Wt 133.6 lb

## 2024-01-11 DIAGNOSIS — E559 Vitamin D deficiency, unspecified: Secondary | ICD-10-CM

## 2024-01-11 DIAGNOSIS — M2550 Pain in unspecified joint: Secondary | ICD-10-CM

## 2024-01-11 DIAGNOSIS — F411 Generalized anxiety disorder: Secondary | ICD-10-CM | POA: Diagnosis not present

## 2024-01-11 DIAGNOSIS — R55 Syncope and collapse: Secondary | ICD-10-CM

## 2024-01-11 DIAGNOSIS — E538 Deficiency of other specified B group vitamins: Secondary | ICD-10-CM

## 2024-01-11 DIAGNOSIS — I10 Essential (primary) hypertension: Secondary | ICD-10-CM | POA: Diagnosis not present

## 2024-01-11 DIAGNOSIS — E034 Atrophy of thyroid (acquired): Secondary | ICD-10-CM

## 2024-01-11 DIAGNOSIS — M8580 Other specified disorders of bone density and structure, unspecified site: Secondary | ICD-10-CM

## 2024-01-11 LAB — CBC WITH DIFFERENTIAL/PLATELET
Basophils Absolute: 0.1 K/uL (ref 0.0–0.1)
Basophils Relative: 0.8 % (ref 0.0–3.0)
Eosinophils Absolute: 0.3 K/uL (ref 0.0–0.7)
Eosinophils Relative: 3.6 % (ref 0.0–5.0)
HCT: 42.4 % (ref 36.0–46.0)
Hemoglobin: 14.1 g/dL (ref 12.0–15.0)
Lymphocytes Relative: 26.8 % (ref 12.0–46.0)
Lymphs Abs: 2.1 K/uL (ref 0.7–4.0)
MCHC: 33.2 g/dL (ref 30.0–36.0)
MCV: 91.7 fl (ref 78.0–100.0)
Monocytes Absolute: 0.7 K/uL (ref 0.1–1.0)
Monocytes Relative: 9.2 % (ref 3.0–12.0)
Neutro Abs: 4.6 K/uL (ref 1.4–7.7)
Neutrophils Relative %: 59.6 % (ref 43.0–77.0)
Platelets: 302 K/uL (ref 150.0–400.0)
RBC: 4.62 Mil/uL (ref 3.87–5.11)
RDW: 13.4 % (ref 11.5–15.5)
WBC: 7.8 K/uL (ref 4.0–10.5)

## 2024-01-11 LAB — COMPREHENSIVE METABOLIC PANEL WITH GFR
ALT: 12 U/L (ref 3–35)
AST: 20 U/L (ref 5–37)
Albumin: 4 g/dL (ref 3.5–5.2)
Alkaline Phosphatase: 83 U/L (ref 39–117)
BUN: 9 mg/dL (ref 6–23)
CO2: 25 meq/L (ref 19–32)
Calcium: 8.9 mg/dL (ref 8.4–10.5)
Chloride: 104 meq/L (ref 96–112)
Creatinine, Ser: 0.63 mg/dL (ref 0.40–1.20)
GFR: 86.84 mL/min
Glucose, Bld: 92 mg/dL (ref 70–99)
Potassium: 4.2 meq/L (ref 3.5–5.1)
Sodium: 139 meq/L (ref 135–145)
Total Bilirubin: 0.3 mg/dL (ref 0.2–1.2)
Total Protein: 7.6 g/dL (ref 6.0–8.3)

## 2024-01-11 LAB — CK: Total CK: 37 U/L (ref 17–177)

## 2024-01-11 MED ORDER — PREDNISONE 5 MG PO TABS: 15.0000 mg | ORAL_TABLET | Freq: Every day | ORAL | 1 refills | Status: AC

## 2024-01-11 MED ORDER — PANTOPRAZOLE SODIUM 40 MG PO TBEC: 40.0000 mg | DELAYED_RELEASE_TABLET | Freq: Every day | ORAL | 3 refills | Status: AC

## 2024-01-11 MED ORDER — TRAMADOL HCL 50 MG PO TABS: 50.0000 mg | ORAL_TABLET | Freq: Four times a day (QID) | ORAL | 1 refills | Status: AC | PRN

## 2024-01-11 NOTE — Progress Notes (Signed)
 While what he said  Subjective:  Patient ID: Ellen [name], female    DOB: 05-27-1950  Age: 74 y.o. MRN: [id number]  CC: Medical Management of Chronic Issues (3 Month follow up)   HPI Ellen [name] presents for arthralgias - arms, wrist, fingers, hips since Sept 2025. Pt thinks Prolia  caused it. The first Prolia  given in Set 2025... Stiff in am  - bad for 1-2 hours Pain is 10 out of 10 at times, 6-7/10 on Celebrex   Outpatient Medications Prior to Visit  Medication Sig Dispense Refill   amoxicillin -clavulanate (AUGMENTIN ) 875-125 MG tablet Take 1 tablet by mouth 2 (two) times daily. 20 tablet 0   aspirin  EC 81 MG tablet Take 1 tablet (81 mg total) by mouth daily.     Calcium Carb-Cholecalciferol (CALCIUM + D3 PO) Take by mouth. Calcium 600mg  & vit d3 2000 iu     cephALEXin  (KEFLEX ) 500 MG capsule Take 1 capsule (500 mg total) by mouth 3 (three) times daily. 21 capsule 0   citalopram  (CELEXA ) 20 MG tablet Take 1 tablet by mouth once daily 90 tablet 3   CRANBERRY EXTRACT PO Take 1 capsule by mouth daily.      Cyanocobalamin  (B-12) 5000 MCG SUBL 1 tab sl qd 100 tablet 3   donepezil  (ARICEPT ) 5 MG tablet Take 1 tablet (5 mg total) by mouth at bedtime. 90 tablet 3   fexofenadine (ALLEGRA) 180 MG tablet Take 180 mg by mouth daily.     levothyroxine  (SYNTHROID ) 112 MCG tablet Take 1 tablet (112 mcg total) by mouth daily. 90 tablet 3   Multiple Vitamin (MULTIVITAMIN) tablet Take 1 tablet by mouth daily.     Probiotic Product (PROBIOTIC PO) Take by mouth. 60 billion     traZODone  (DESYREL ) 50 MG tablet Take 2 tablets (100 mg total) by mouth at bedtime. 180 tablet 3   triamcinolone  ointment (KENALOG ) 0.5 % Use qid prn 90 g 3   trimethoprim -polymyxin b  (POLYTRIM ) ophthalmic solution Place 1 drop into the left eye every 6 (six) hours. 10 mL 0   valACYclovir  (VALTREX ) 500 MG tablet Take 1 tablet (500 mg total) by mouth 2 (two) times daily. 14 tablet 2   Valerian Root 100 MG CAPS Take 100 mg by mouth  at bedtime.     celecoxib  (CELEBREX ) 100 MG capsule Take 1 capsule (100 mg total) by mouth 2 (two) times daily. 60 capsule 3   pantoprazole  (PROTONIX ) 40 MG tablet Take 1 tablet by mouth once daily 90 tablet 3   ofloxacin (OCUFLOX) 0.3 % ophthalmic solution 1 drop 3 (three) times daily. (Patient not taking: Reported on 01/11/2024)     phenazopyridine  (PYRIDIUM ) 100 MG tablet Take 1 tablet (100 mg total) by mouth 3 (three) times daily as needed for pain. 12 tablet 1   promethazine -dextromethorphan (PROMETHAZINE -DM) 6.25-15 MG/5ML syrup Take 5 mLs by mouth 4 (four) times daily as needed for cough. 240 mL 0   No facility-administered medications prior to visit.    ROS: Review of Systems  Constitutional:  Positive for fatigue and unexpected weight change. Negative for activity change, appetite change and chills.  HENT:  Negative for congestion, mouth sores and sinus pressure.   Eyes:  Negative for visual disturbance.  Respiratory:  Negative for cough and chest tightness.   Gastrointestinal:  Negative for abdominal pain and nausea.  Genitourinary:  Negative for difficulty urinating, frequency and vaginal pain.  Musculoskeletal:  Positive for arthralgias, back pain, gait problem, myalgias, neck pain and neck  stiffness.  Skin:  Negative for pallor and rash.  Neurological:  Negative for dizziness, tremors, weakness, numbness and headaches.  Psychiatric/Behavioral:  Positive for sleep disturbance. Negative for confusion and suicidal ideas. The patient is not nervous/anxious.     Objective:  BP 108/72   Pulse 67   Ht 5' 1.5 (1.562 m)   Wt 133 lb 9.6 oz (60.6 kg)   SpO2 97%   BMI 24.83 kg/m   BP Readings from Last 3 Encounters:  01/11/24 108/72  12/17/23 128/74  11/26/23 100/60    Wt Readings from Last 3 Encounters:  01/11/24 133 lb 9.6 oz (60.6 kg)  11/26/23 137 lb (62.1 kg)  11/02/23 138 lb 6.4 oz (62.8 kg)    Physical Exam Constitutional:      General: She is not in acute  distress.    Appearance: Normal appearance. She is well-developed.  HENT:     Head: Normocephalic.     Right Ear: External ear normal.     Left Ear: External ear normal.     Nose: Nose normal.  Eyes:     General:        Right eye: No discharge.        Left eye: No discharge.     Conjunctiva/sclera: Conjunctivae normal.     Pupils: Pupils are equal, round, and reactive to light.  Neck:     Thyroid : No thyromegaly.     Vascular: No JVD.     Trachea: No tracheal deviation.  Cardiovascular:     Rate and Rhythm: Normal rate and regular rhythm.     Heart sounds: Normal heart sounds.  Pulmonary:     Effort: No respiratory distress.     Breath sounds: No stridor. No wheezing.  Abdominal:     General: Bowel sounds are normal. There is no distension.     Palpations: Abdomen is soft. There is no mass.     Tenderness: There is no abdominal tenderness. There is no guarding or rebound.  Musculoskeletal:        General: No tenderness.     Cervical back: Normal range of motion and neck supple. No rigidity.  Lymphadenopathy:     Cervical: No cervical adenopathy.  Skin:    Findings: No erythema or rash.  Neurological:     Mental Status: She is oriented to person, place, and time.     Cranial Nerves: No cranial nerve deficit.     Motor: No abnormal muscle tone.     Coordination: Coordination normal.     Deep Tendon Reflexes: Reflexes normal.  Psychiatric:        Behavior: Behavior normal.        Thought Content: Thought content normal.        Judgment: Judgment normal.     Lab Results  Component Value Date   WBC 9.7 12/17/2023   HGB 13.2 12/17/2023   HCT 39.1 12/17/2023   PLT 310 12/17/2023   GLUCOSE 84 12/17/2023   CHOL 187 10/21/2023   TRIG 107.0 10/21/2023   HDL 62.40 10/21/2023   LDLDIRECT 142.6 07/09/2011   LDLCALC 103 (H) 10/21/2023   ALT 11 12/17/2023   AST 22 12/17/2023   NA 138 12/17/2023   K 4.3 12/17/2023   CL 104 12/17/2023   CREATININE 0.61 12/17/2023   BUN  10 12/17/2023   CO2 21 (L) 12/17/2023   TSH 2.04 12/15/2023   INR 0.9 10/16/2020   HGBA1C 5.6 10/12/2006    DG Chest Portable 1  View Result Date: 12/17/2023 CLINICAL DATA:  Chest pain radiating into the left arm EXAM: PORTABLE CHEST 1 VIEW COMPARISON:  Chest radiograph dated 06/09/2012 FINDINGS: Normal lung volumes. Patchy right basilar opacity. No pleural effusion or pneumothorax. The heart size and mediastinal contours are within normal limits. No acute osseous abnormality. IMPRESSION: Patchy right basilar opacity, which may represent atelectasis, aspiration, or pneumonia. Electronically Signed   By: Limin  Xu M.D.   On: 12/17/2023 13:26    Assessment & Plan:   Problem List Items Addressed This Visit     SYNCOPE   06/15/2023 ?vaso-vagal. Possible heat exhaustion outside  the day prior. 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    Dispense:  20 tablet    Refill:  1   predniSONE  (DELTASONE ) 5 MG tablet    Sig: Take 3 tablets (15 mg total) by mouth daily with breakfast.    Dispense:  90 tablet    Refill:  1   pantoprazole  (PROTONIX ) 40 MG tablet    Sig: Take 1 tablet (40 mg total) by mouth daily.    Dispense:  90 tablet    Refill:  3      Follow-up: Return in about 3 months (around 04/10/2024) for a follow-up visit.  Marolyn Noel, MD

## 2024-01-11 NOTE — Assessment & Plan Note (Signed)
 On B12

## 2024-01-11 NOTE — Assessment & Plan Note (Signed)
"   Pt thinks Prolia  caused arthralgias. The first Prolia  given in Set 2025... "

## 2024-01-11 NOTE — Assessment & Plan Note (Signed)
Cont w/LEVOTHROID 112 MCG/D

## 2024-01-11 NOTE — Assessment & Plan Note (Signed)
1/21 Coronary calcium score of 6. This was 22 th percentile for age and sex matched control. On NAS diet

## 2024-01-11 NOTE — Assessment & Plan Note (Signed)
Chronic °Citalopram ° Potential benefits of a long term SSRI use as well as potential risks  and complications were explained to the patient and were aknowledged. °

## 2024-01-11 NOTE — Assessment & Plan Note (Addendum)
 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 Protonix  po while on steroids Repeat ESR, other labs

## 2024-01-11 NOTE — Assessment & Plan Note (Addendum)
 No relpase

## 2024-01-11 NOTE — Assessment & Plan Note (Signed)
 On Vit D

## 2024-01-13 ENCOUNTER — Ambulatory Visit: Payer: Self-pay | Admitting: Internal Medicine

## 2024-01-13 DIAGNOSIS — M2550 Pain in unspecified joint: Secondary | ICD-10-CM

## 2024-01-13 LAB — LYME DISEASE, WESTERN BLOT
"  IgG P18 Ab.": ABSENT
"  IgG P23 Ab.": ABSENT
"  IgG P28 Ab.": ABSENT
"  IgG P30 Ab.": ABSENT
"  IgG P39 Ab.": ABSENT
"  IgG P41 Ab.": ABSENT
"  IgG P45 Ab.": ABSENT
"  IgG P66 Ab.": ABSENT
"  IgG P93 Ab.": ABSENT
"  IgM P23 Ab.": ABSENT
"  IgM P39 Ab.": ABSENT
"  IgM P41 Ab.": ABSENT
Lyme IgG Wb: NEGATIVE
Lyme IgM Wb: NEGATIVE

## 2024-01-13 LAB — ANTI-NUCLEAR AB-TITER (ANA TITER)
ANA TITER: 1:40 {titer} — ABNORMAL HIGH
ANA Titer 1: 1:40 {titer} — ABNORMAL HIGH

## 2024-01-13 LAB — ANA: Anti Nuclear Antibody (ANA): POSITIVE — AB

## 2024-01-13 LAB — RHEUMATOID FACTOR: Rheumatoid fact SerPl-aCnc: 121 [IU]/mL — ABNORMAL HIGH

## 2024-01-14 ENCOUNTER — Encounter: Payer: Self-pay | Admitting: Internal Medicine

## 2024-01-20 ENCOUNTER — Encounter: Payer: Self-pay | Admitting: Internal Medicine

## 2024-01-20 ENCOUNTER — Other Ambulatory Visit

## 2024-01-20 ENCOUNTER — Other Ambulatory Visit (INDEPENDENT_AMBULATORY_CARE_PROVIDER_SITE_OTHER)

## 2024-01-20 DIAGNOSIS — M2550 Pain in unspecified joint: Secondary | ICD-10-CM

## 2024-01-20 DIAGNOSIS — M25511 Pain in right shoulder: Secondary | ICD-10-CM

## 2024-01-20 LAB — C-REACTIVE PROTEIN: CRP: <0.5 mg/dL — ABNORMAL LOW (ref 1.0–20.0)

## 2024-01-21 ENCOUNTER — Other Ambulatory Visit: Payer: Self-pay

## 2024-01-21 ENCOUNTER — Telehealth: Payer: Self-pay

## 2024-01-21 ENCOUNTER — Other Ambulatory Visit: Payer: Self-pay | Admitting: Internal Medicine

## 2024-01-21 ENCOUNTER — Other Ambulatory Visit

## 2024-01-21 DIAGNOSIS — M2550 Pain in unspecified joint: Secondary | ICD-10-CM

## 2024-01-21 LAB — SEDIMENTATION RATE: Sed Rate: 9 mm/h (ref 0–30)

## 2024-01-21 LAB — CYCLIC CITRUL PEPTIDE ANTIBODY, IGG: Cyclic Citrullin Peptide Ab: <16 U

## 2024-01-21 NOTE — Telephone Encounter (Signed)
 Copied from CRM #[id number]. Topic: General - Other >> Jan 21, 2024 12:30 PM Ellen [name] wrote: Reason for CRM: Patient called in to find out if the labs she has to take tomorrow requires fasting. Please call.

## 2024-01-22 ENCOUNTER — Ambulatory Visit: Payer: Self-pay | Admitting: Internal Medicine

## 2024-01-22 DIAGNOSIS — M2550 Pain in unspecified joint: Secondary | ICD-10-CM

## 2024-01-22 LAB — FANA STAINING PATTERNS: Speckled Pattern: 1:80 {titer}

## 2024-01-22 LAB — ANA+ENA+DNA/DS+ANTICH+CENTR
ANA Titer 1: POSITIVE — AB
Anti JO-1: <0.2 AI (ref 0.0–0.9)
Centromere Ab Screen: <0.2 AI (ref 0.0–0.9)
Chromatin Ab SerPl-aCnc: <0.2 AI (ref 0.0–0.9)
ENA RNP Ab: <0.2 AI (ref 0.0–0.9)
ENA SM Ab Ser-aCnc: <0.2 AI (ref 0.0–0.9)
ENA SSA (RO) Ab: <0.2 AI (ref 0.0–0.9)
ENA SSB (LA) Ab: <0.2 AI (ref 0.0–0.9)
Scleroderma (Scl-70) (ENA) Antibody, IgG: <0.2 AI (ref 0.0–0.9)
dsDNA Ab: 1 [IU]/mL (ref 0–9)

## 2024-01-22 LAB — CYCLIC CITRUL PEPTIDE ANTIBODY, IGG: Cyclic Citrullin Peptide Ab: <16 U

## 2024-01-22 NOTE — Telephone Encounter (Signed)
 I spoke with the pt and was able to confirm she has had the labs done on 01/21/2024.

## 2024-01-24 ENCOUNTER — Other Ambulatory Visit: Payer: Self-pay | Admitting: Internal Medicine

## 2024-01-24 DIAGNOSIS — R300 Dysuria: Secondary | ICD-10-CM

## 2024-01-25 ENCOUNTER — Other Ambulatory Visit

## 2024-01-25 DIAGNOSIS — M2550 Pain in unspecified joint: Secondary | ICD-10-CM

## 2024-01-25 DIAGNOSIS — R300 Dysuria: Secondary | ICD-10-CM | POA: Diagnosis not present

## 2024-01-25 LAB — URINALYSIS, ROUTINE W REFLEX MICROSCOPIC
Bilirubin Urine: NEGATIVE
Hgb urine dipstick: NEGATIVE
Ketones, ur: NEGATIVE
Nitrite: NEGATIVE
Specific Gravity, Urine: <=1.005 — AB (ref 1.000–1.030)
Total Protein, Urine: NEGATIVE
Urine Glucose: NEGATIVE
Urobilinogen, UA: 0.2 (ref 0.0–1.0)
pH: 6 (ref 5.0–8.0)

## 2024-01-26 ENCOUNTER — Telehealth: Payer: Self-pay

## 2024-01-26 NOTE — Telephone Encounter (Signed)
 Copied from CRM #[id number]. Topic: Clinical - Lab/Test Results >> Jan 26, 2024  2:50 PM Willma R wrote: Reason for CRM: Dolly from [facility] is requesting a copy of the patients last labs to be faxed to her at [phone]  Daralyn can be reached at [phone] ext [phone]

## 2024-01-27 ENCOUNTER — Other Ambulatory Visit: Payer: Self-pay | Admitting: Internal Medicine

## 2024-01-27 ENCOUNTER — Encounter: Payer: Self-pay | Admitting: Internal Medicine

## 2024-01-27 ENCOUNTER — Ambulatory Visit: Admitting: Internal Medicine

## 2024-01-27 ENCOUNTER — Ambulatory Visit: Payer: Self-pay

## 2024-01-27 VITALS — BP 104/72 | HR 88 | Temp 98.6°F | Ht 61.5 in | Wt 128.0 lb

## 2024-01-27 DIAGNOSIS — R3915 Urgency of urination: Secondary | ICD-10-CM

## 2024-01-27 DIAGNOSIS — K5900 Constipation, unspecified: Secondary | ICD-10-CM

## 2024-01-27 DIAGNOSIS — R1032 Left lower quadrant pain: Secondary | ICD-10-CM

## 2024-01-27 LAB — CULTURE, URINE COMPREHENSIVE: RESULT:: NO GROWTH

## 2024-01-27 NOTE — Patient Instructions (Addendum)
" ° ° °  Continue miralax - increase to twice daily.      Medications changes include :   None      Return if symptoms worsen or fail to improve.  "

## 2024-01-27 NOTE — Telephone Encounter (Signed)
 FYI Only or Action Required?: FYI only for provider: appointment scheduled on 1/21.  Patient was last seen in primary care on 01/11/2024 by Plotnikov, Karlynn GAILS, MD.  Called Nurse Triage reporting Generalized Body Aches.  Symptoms began 2 days ago.  Interventions attempted: Nothing.  Symptoms are: gradually worsening.  Triage Disposition: See HCP Within 4 Hours (Or PCP Triage)  Patient/caregiver understands and will follow disposition?: Yes   Reason for Triage: Ongoing issues with Osteoporosis, issues getting worse having a flare up lot of inflammation and pain in joints, maybe running a temperature, pain is hard to tolerate can't give a number on pain scale can barely put one foot in front of the other from the pain  Reason for Disposition  [1] MILD-MODERATE pain AND [2] constant AND [3] present > 2 hours  [1] MODERATE pain (e.g., interferes with normal activities) AND [2] present > 3 days  Answer Assessment - Initial Assessment Questions 1. LOCATION: Where does it hurt?      Left lower abd 2. RADIATION: Does the pain shoot anywhere else? (e.g., chest, back)     no 3. ONSET: When did the pain begin? (e.g., minutes, hours or days ago)      Monday 4. SUDDEN: Gradual or sudden onset?     sudden 5. PATTERN Does the pain come and go, or is it constant?     Constant 6. SEVERITY: How bad is the pain?  (e.g., Scale 1-10; mild, moderate, or severe)     Varies in intensity 7. RECURRENT SYMPTOM: Have you ever had this type of stomach pain before? If Yes, ask: When was the last time? and What happened that time?      Hasn't had this pain 8. CAUSE: What do you think is causing the stomach pain? (e.g., gallstones, recent abdominal surgery)     unknown 9. RELIEVING/AGGRAVATING FACTORS: What makes it better or worse? (e.g., antacids, bending or twisting motion, bowel movement)      10. OTHER SYMPTOMS: Do you have any other symptoms? (e.g., back pain, diarrhea, fever,  urination pain, vomiting)       Denies diarrhea,  Protocols used: Muscle Aches and Body Pain-A-AH, Abdominal Pain - Female-A-AH

## 2024-01-27 NOTE — Progress Notes (Signed)
 "   Subjective:    Patient ID: Ellen [name], female    DOB: 09-Aug-1950, 74 y.o.   MRN: [id number]      HPI Ellen [name] is here for  Chief Complaint  Patient presents with   Abdominal Pain    Left sided abdominal pain having trouble with bowel movements; She has been using Miralax; just not feeling good over all   Discussed the use of AI scribe software for clinical note transcription with the patient, who gave verbal consent to proceed.  History of Present Illness Ellen [name] is a 74 year old female who presents with abdominal pain and constipation.  She has been experiencing significant abdominal pain for the past six days, with intensity ranging from 6 to 9 on a scale of 10. The pain is associated with a sensation of needing to have a bowel movement without being able to do so. She has been taking Miralax nightly for the past five nights and has had only small, infrequent bowel movements. Her last substantial bowel movement was over a week ago. No nausea, blood in the stool, or black stool, but she reports bloating and fullness, affecting her appetite.  She has a history of recurrent urinary tract infections and was treated for a UTI and pneumonia in the right lung in December. She reports discomfort and pressure during urination, with urgency and frequency, but only passing small amounts of urine. No blood in the urine. She has been drinking large amounts of water  to stay hydrated.  She has been on prednisone  for about ten days, which she started before the onset of her current symptoms, to manage pain in her wrists, arms, elbows, and shoulder. She also received a Prolia  injection in September for osteoporosis, after which she noticed a decline in her health, including muscular aches and pains.  Her family history includes her father, who had a tick bite and died of encephalitis and spinal meningitis in his fifties. She expresses concern about insect bites due to this history. She also  mentions a recent dermatological procedure to remove a cyst that was itchy for several months.  She is an active cyclist, participating in events like [employer], and is involved in activities with her family, including her three children and five grandchildren. She notes a decrease in activity recently due to her symptoms.     Medications and allergies reviewed with patient and updated if appropriate.  Medications Ordered Prior to Encounter[1]  Review of Systems  Constitutional:  Positive for appetite change and fever (subjective).  Gastrointestinal:  Positive for abdominal distention (full, bloated), abdominal pain (LLQ) and constipation. Negative for blood in stool and nausea.       No gerd  Genitourinary:  Positive for decreased urine volume, difficulty urinating, dysuria (pressure with urination), frequency and urgency. Negative for hematuria.       Objective:   Vitals:   01/27/24 1515  BP: 104/72  Pulse: 88  Temp: 98.6 F (37 C)  SpO2: 98%   BP Readings from Last 3 Encounters:  01/27/24 104/72  01/11/24 108/72  12/17/23 128/74   Wt Readings from Last 3 Encounters:  01/27/24 128 lb (58.1 kg)  01/11/24 133 lb 9.6 oz (60.6 kg)  11/26/23 137 lb (62.1 kg)   Body mass index is 23.79 kg/m.    Physical Exam Constitutional:      General: She is not in acute distress.    Appearance: Normal appearance. She is not ill-appearing.  HENT:  Head: Normocephalic and atraumatic.  Abdominal:     General: There is no distension.     Palpations: Abdomen is soft.     Tenderness: There is abdominal tenderness (Mild tenderness in left lower quadrant). There is no right CVA tenderness, left CVA tenderness, guarding or rebound.  Skin:    General: Skin is warm and dry.     Findings: No rash.  Neurological:     Mental Status: She is alert.         Last colonoscopy 2022.  5 mm polyp removed from proximal transverse colon.  5 mg follow-up scratch that 5 mm polyp removed from  rectosigmoid colon.  Multiple small and large mouth diverticula in the sigmoid and descending colon.  Internal hemorrhoids-small.   Assessment & Plan:    Assessment and Plan Assessment & Plan Constipation with left lower quadrant abdominal pain Abdominal pain localized to the left lower quadrant with constipation. Pain severity decreased-initially severe and now moderate. Differential includes diverticulitis due to history of diverticulosis and pain characteristics. - Increase Miralax to twice daily. - Ordered CT scan of the abdomen to evaluate for diverticulitis. - Advised to maintain adequate hydration. - Instructed to seek emergency care if pain becomes very intense. - Further treatment based on CT scan   Urinary urgency and hesitancy under evaluation Urinary urgency and hesitancy without infection signs. Symptoms possibly related to constipation. - Await urine culture results to rule out infection. - Continue to monitor symptoms and adjust management based on culture results.        [1]  Current Outpatient Medications on File Prior to Visit  Medication Sig Dispense Refill   amoxicillin -clavulanate (AUGMENTIN ) 875-125 MG tablet Take 1 tablet by mouth 2 (two) times daily. 20 tablet 0   aspirin  EC 81 MG tablet Take 1 tablet (81 mg total) by mouth daily.     Calcium Carb-Cholecalciferol (CALCIUM + D3 PO) Take by mouth. Calcium 600mg  & vit d3 2000 iu     cephALEXin  (KEFLEX ) 500 MG capsule Take 1 capsule (500 mg total) by mouth 3 (three) times daily. 21 capsule 0   citalopram  (CELEXA ) 20 MG tablet Take 1 tablet by mouth once daily 90 tablet 3   CRANBERRY EXTRACT PO Take 1 capsule by mouth daily.      Cyanocobalamin  (B-12) 5000 MCG SUBL 1 tab sl qd 100 tablet 3   donepezil  (ARICEPT ) 5 MG tablet Take 1 tablet (5 mg total) by mouth at bedtime. 90 tablet 3   fexofenadine (ALLEGRA) 180 MG tablet Take 180 mg by mouth daily.     levothyroxine  (SYNTHROID ) 112 MCG tablet Take 1 tablet (112  mcg total) by mouth daily. 90 tablet 3   Multiple Vitamin (MULTIVITAMIN) tablet Take 1 tablet by mouth daily.     pantoprazole  (PROTONIX ) 40 MG tablet Take 1 tablet (40 mg total) by mouth daily. 90 tablet 3   predniSONE  (DELTASONE ) 5 MG tablet Take 3 tablets (15 mg total) by mouth daily with breakfast. 90 tablet 1   Probiotic Product (PROBIOTIC PO) Take by mouth. 60 billion     traMADol  (ULTRAM ) 50 MG tablet Take 1 tablet (50 mg total) by mouth every 6 (six) hours as needed for severe pain (pain score 7-10). 20 tablet 1   traZODone  (DESYREL ) 50 MG tablet Take 2 tablets (100 mg total) by mouth at bedtime. 180 tablet 3   triamcinolone  ointment (KENALOG ) 0.5 % Use qid prn 90 g 3   trimethoprim -polymyxin b  (POLYTRIM ) ophthalmic solution Place 1  drop into the left eye every 6 (six) hours. 10 mL 0   valACYclovir  (VALTREX ) 500 MG tablet Take 1 tablet (500 mg total) by mouth 2 (two) times daily. 14 tablet 2   Valerian Root 100 MG CAPS Take 100 mg by mouth at bedtime.     ofloxacin (OCUFLOX) 0.3 % ophthalmic solution 1 drop 3 (three) times daily. (Patient not taking: Reported on 01/27/2024)     No current facility-administered medications on file prior to visit.   "

## 2024-01-27 NOTE — Telephone Encounter (Signed)
 I am waiting for recent labs to be resulted. Once PCP sees this I can fax over the labs.

## 2024-01-28 ENCOUNTER — Telehealth: Payer: Self-pay

## 2024-01-28 ENCOUNTER — Encounter: Payer: Self-pay | Admitting: Radiology

## 2024-01-28 ENCOUNTER — Ambulatory Visit: Payer: Self-pay | Admitting: Internal Medicine

## 2024-01-28 ENCOUNTER — Ambulatory Visit
Admission: RE | Admit: 2024-01-28 | Discharge: 2024-01-28 | Disposition: A | Discharge status: Home / Self Care | Source: Ambulatory Visit | Attending: Internal Medicine | Admitting: Internal Medicine

## 2024-01-28 DIAGNOSIS — R1032 Left lower quadrant pain: Secondary | ICD-10-CM

## 2024-01-28 MED ORDER — IOPAMIDOL (ISOVUE-300) INJECTION 61%
100.0000 mL | Freq: Once | INTRAVENOUS | Status: AC | PRN
  Administered 2024-01-28: 100 mL via INTRAVENOUS

## 2024-01-28 MED ORDER — AMOXICILLIN-POT CLAVULANATE 875-125 MG PO TABS: 1.0000 | ORAL_TABLET | Freq: Two times a day (BID) | ORAL | 0 refills | Status: AC

## 2024-01-28 NOTE — Telephone Encounter (Signed)
 See result note.

## 2024-01-30 DIAGNOSIS — N390 Urinary tract infection, site not specified: Secondary | ICD-10-CM

## 2024-02-08 ENCOUNTER — Ambulatory Visit: Admitting: Internal Medicine

## 2024-02-16 ENCOUNTER — Ambulatory Visit: Admitting: Internal Medicine

## 2024-02-26 ENCOUNTER — Ambulatory Visit: Admitting: Family Medicine

## 2024-03-10 ENCOUNTER — Ambulatory Visit: Admitting: Internal Medicine

## 2024-03-10 ENCOUNTER — Ambulatory Visit (HOSPITAL_BASED_OUTPATIENT_CLINIC_OR_DEPARTMENT_OTHER): Admitting: Cardiovascular Disease

## 2024-03-31 ENCOUNTER — Ambulatory Visit (HOSPITAL_BASED_OUTPATIENT_CLINIC_OR_DEPARTMENT_OTHER): Admitting: Cardiovascular Disease
